# Patient Record
Sex: Female | Born: 1945 | Race: White | Hispanic: No | Marital: Married | Smoking: Never smoker
Health system: Southern US, Community
[De-identification: ages and names within clinical notes are randomized; demographics above are authoritative.]

## PROBLEM LIST (undated history)

## (undated) DIAGNOSIS — F419 Anxiety disorder, unspecified: Secondary | ICD-10-CM

## (undated) DIAGNOSIS — R32 Unspecified urinary incontinence: Secondary | ICD-10-CM

## (undated) DIAGNOSIS — E119 Type 2 diabetes mellitus without complications: Secondary | ICD-10-CM

## (undated) DIAGNOSIS — I10 Essential (primary) hypertension: Secondary | ICD-10-CM

## (undated) DIAGNOSIS — M7989 Other specified soft tissue disorders: Secondary | ICD-10-CM

## (undated) DIAGNOSIS — G4489 Other headache syndrome: Secondary | ICD-10-CM

## (undated) HISTORY — DX: Type 2 diabetes mellitus without complications: E11.9

## (undated) HISTORY — DX: Essential (primary) hypertension: I10

## (undated) HISTORY — DX: Other headache syndrome: G44.89

## (undated) HISTORY — DX: Anxiety disorder, unspecified: F41.9

## (undated) HISTORY — DX: Other specified soft tissue disorders: M79.89

## (undated) HISTORY — DX: Unspecified urinary incontinence: R32

## (undated) HISTORY — PX: NO PAST SURGERIES: SHX2092

---

## 1999-09-25 ENCOUNTER — Encounter: Payer: Self-pay | Admitting: Emergency Medicine

## 1999-09-25 ENCOUNTER — Emergency Department (HOSPITAL_COMMUNITY): Admission: EM | Admit: 1999-09-25 | Discharge: 1999-09-25 | Payer: Self-pay | Admitting: Emergency Medicine

## 1999-09-29 ENCOUNTER — Other Ambulatory Visit: Admission: RE | Admit: 1999-09-29 | Discharge: 1999-09-29 | Payer: Self-pay | Admitting: Obstetrics and Gynecology

## 2001-02-18 ENCOUNTER — Other Ambulatory Visit: Admission: RE | Admit: 2001-02-18 | Discharge: 2001-02-18 | Payer: Self-pay | Admitting: Obstetrics and Gynecology

## 2002-02-20 ENCOUNTER — Other Ambulatory Visit: Admission: RE | Admit: 2002-02-20 | Discharge: 2002-02-20 | Payer: Self-pay | Admitting: Obstetrics and Gynecology

## 2003-03-13 ENCOUNTER — Other Ambulatory Visit: Admission: RE | Admit: 2003-03-13 | Discharge: 2003-03-13 | Payer: Self-pay | Admitting: Obstetrics and Gynecology

## 2004-03-14 ENCOUNTER — Other Ambulatory Visit: Admission: RE | Admit: 2004-03-14 | Discharge: 2004-03-14 | Payer: Self-pay | Admitting: Obstetrics and Gynecology

## 2005-02-01 ENCOUNTER — Ambulatory Visit (HOSPITAL_COMMUNITY): Admission: RE | Admit: 2005-02-01 | Discharge: 2005-02-01 | Payer: Self-pay | Admitting: Gastroenterology

## 2005-02-01 ENCOUNTER — Encounter (INDEPENDENT_AMBULATORY_CARE_PROVIDER_SITE_OTHER): Payer: Self-pay | Admitting: *Deleted

## 2005-02-01 LAB — HM COLONOSCOPY

## 2005-07-12 ENCOUNTER — Other Ambulatory Visit: Admission: RE | Admit: 2005-07-12 | Discharge: 2005-07-12 | Payer: Self-pay | Admitting: *Deleted

## 2006-04-02 ENCOUNTER — Other Ambulatory Visit: Admission: RE | Admit: 2006-04-02 | Discharge: 2006-04-02 | Payer: Self-pay | Admitting: Obstetrics & Gynecology

## 2006-09-05 ENCOUNTER — Ambulatory Visit (HOSPITAL_COMMUNITY): Admission: RE | Admit: 2006-09-05 | Discharge: 2006-09-05 | Payer: Self-pay | Admitting: Family Medicine

## 2006-09-05 ENCOUNTER — Encounter: Payer: Self-pay | Admitting: Vascular Surgery

## 2007-07-25 ENCOUNTER — Other Ambulatory Visit: Admission: RE | Admit: 2007-07-25 | Discharge: 2007-07-25 | Payer: Self-pay | Admitting: Obstetrics & Gynecology

## 2008-08-19 ENCOUNTER — Other Ambulatory Visit: Admission: RE | Admit: 2008-08-19 | Discharge: 2008-08-19 | Payer: Self-pay | Admitting: Obstetrics and Gynecology

## 2011-03-31 NOTE — Op Note (Signed)
Amanda Collier, Amanda Collier             ACCOUNT NO.:  [id number]   MEDICAL RECORD NO.:  [id number]          PATIENT TYPE:  AMB   LOCATION:  ENDO                         FACILITY:  [facility]   PHYSICIAN:  Anselmo Rod, M.D.  DATE OF BIRTH:  1946/08/26   DATE OF PROCEDURE:  02/01/2005  DATE OF DISCHARGE:                                 OPERATIVE REPORT   PROCEDURE PERFORMED:  Colonoscopy with cold biopsies x 3.   ENDOSCOPIST:  Anselmo Rod, M.D.   INSTRUMENT USED:  Olympus video colonoscope.   INDICATIONS FOR PROCEDURE:  A 65 year old white female with an unknown  family history (the patient is adopted), with occasional BRBPR, undergoing a  screening colonoscopy to rule out colonic polyps, masses, etc.   PRE-PROCEDURE PREPARATION:  Informed consent was procured from the patient.  The patient was fasted for eight hours prior to the procedure and prepped  with a bottle of magnesium citrate and a gallon of GoLYTELY the night prior  to the procedure.  Risks and benefits of the procedure including a 10% miss  rate of cancer and polyps were discussed with the patient as well.   PRE-PROCEDURE PHYSICAL:  VITAL SIGNS:  The patient had stable vital signs.  NECK:  Supple.  CHEST:  Clear to auscultation.  CARDIOVASCULAR:  S1, S2 regular.  ABDOMEN:  Soft, with normal bowel sounds.   DESCRIPTION OF THE PROCEDURE:  The patient was placed in the left lateral  decubitus position, sedated with 70 mg of Demerol and 7 mg of Versed in slow  incremental doses.  Once the patient was adequately sedated and maintained  on low-flow oxygen and continuous cardiac monitoring, the Olympus video  colonoscope was advanced from the rectum to the cecum.  Gentle abdominal  pressure was applied to reach the cecal base.  The appendiceal orifice and  the ileocecal valve were clearly visualized and photographed.  There were  some early left-sided diverticula seen.  A small sessile polyp was biopsied  from the rectum  (cold biopsies x 3).  There was no evidence of internal  hemorrhoids on retroflexion.  A small external hemorrhoid was seen on anal  inspection.  The patient tolerated the procedure well, without  complications.   IMPRESSION:  1.  Small external hemorrhoid.  2.  Early sigmoid diverticula.  3.  Small sessile polyps biopsied from the rectum.  4.  Normal-appearing transverse colon, right colon, and cecum.   RECOMMENDATIONS:  1.  Await pathology results.  2.  Avoid nonsteroidals including aspirin for the next two weeks.  3.  Repeat colonoscopy depending on pathology results.  4.  Outpatient followup as need arises in the future.      JNM/MEDQ  D:  02/01/2005  T:  02/01/2005  Job:  [id number]   cc:   Duncan Dull, M.D.  [address]  [place]  [place] [address]  Fax: [phone]   Laqueta Linden, M.D.  [address]  [place]  [place] [address]  Fax: [phone]

## 2011-11-15 DIAGNOSIS — F411 Generalized anxiety disorder: Secondary | ICD-10-CM | POA: Diagnosis not present

## 2011-11-21 DIAGNOSIS — F411 Generalized anxiety disorder: Secondary | ICD-10-CM | POA: Diagnosis not present

## 2011-11-27 DIAGNOSIS — E119 Type 2 diabetes mellitus without complications: Secondary | ICD-10-CM | POA: Diagnosis not present

## 2011-11-27 DIAGNOSIS — I10 Essential (primary) hypertension: Secondary | ICD-10-CM | POA: Diagnosis not present

## 2011-11-27 DIAGNOSIS — E7800 Pure hypercholesterolemia, unspecified: Secondary | ICD-10-CM | POA: Diagnosis not present

## 2011-11-29 DIAGNOSIS — F411 Generalized anxiety disorder: Secondary | ICD-10-CM | POA: Diagnosis not present

## 2011-12-04 DIAGNOSIS — H60399 Other infective otitis externa, unspecified ear: Secondary | ICD-10-CM | POA: Diagnosis not present

## 2011-12-05 DIAGNOSIS — F411 Generalized anxiety disorder: Secondary | ICD-10-CM | POA: Diagnosis not present

## 2011-12-06 DIAGNOSIS — Z1231 Encounter for screening mammogram for malignant neoplasm of breast: Secondary | ICD-10-CM | POA: Diagnosis not present

## 2011-12-11 DIAGNOSIS — H60399 Other infective otitis externa, unspecified ear: Secondary | ICD-10-CM | POA: Diagnosis not present

## 2011-12-11 DIAGNOSIS — H93299 Other abnormal auditory perceptions, unspecified ear: Secondary | ICD-10-CM | POA: Diagnosis not present

## 2011-12-18 DIAGNOSIS — H60399 Other infective otitis externa, unspecified ear: Secondary | ICD-10-CM | POA: Diagnosis not present

## 2011-12-26 DIAGNOSIS — H60399 Other infective otitis externa, unspecified ear: Secondary | ICD-10-CM | POA: Diagnosis not present

## 2011-12-26 DIAGNOSIS — D239 Other benign neoplasm of skin, unspecified: Secondary | ICD-10-CM | POA: Diagnosis not present

## 2011-12-26 DIAGNOSIS — L819 Disorder of pigmentation, unspecified: Secondary | ICD-10-CM | POA: Diagnosis not present

## 2011-12-26 DIAGNOSIS — L570 Actinic keratosis: Secondary | ICD-10-CM | POA: Diagnosis not present

## 2012-01-24 DIAGNOSIS — Z01419 Encounter for gynecological examination (general) (routine) without abnormal findings: Secondary | ICD-10-CM | POA: Diagnosis not present

## 2012-01-24 DIAGNOSIS — Z124 Encounter for screening for malignant neoplasm of cervix: Secondary | ICD-10-CM | POA: Diagnosis not present

## 2012-04-23 DIAGNOSIS — F411 Generalized anxiety disorder: Secondary | ICD-10-CM | POA: Diagnosis not present

## 2012-04-24 DIAGNOSIS — E559 Vitamin D deficiency, unspecified: Secondary | ICD-10-CM | POA: Diagnosis not present

## 2012-04-24 DIAGNOSIS — E119 Type 2 diabetes mellitus without complications: Secondary | ICD-10-CM | POA: Diagnosis not present

## 2012-04-24 DIAGNOSIS — E7800 Pure hypercholesterolemia, unspecified: Secondary | ICD-10-CM | POA: Diagnosis not present

## 2012-04-24 DIAGNOSIS — I10 Essential (primary) hypertension: Secondary | ICD-10-CM | POA: Diagnosis not present

## 2012-04-26 DIAGNOSIS — H60399 Other infective otitis externa, unspecified ear: Secondary | ICD-10-CM | POA: Diagnosis not present

## 2012-05-03 DIAGNOSIS — H60399 Other infective otitis externa, unspecified ear: Secondary | ICD-10-CM | POA: Diagnosis not present

## 2012-05-03 DIAGNOSIS — H6690 Otitis media, unspecified, unspecified ear: Secondary | ICD-10-CM | POA: Diagnosis not present

## 2012-08-30 DIAGNOSIS — Z23 Encounter for immunization: Secondary | ICD-10-CM | POA: Diagnosis not present

## 2012-11-25 DIAGNOSIS — F411 Generalized anxiety disorder: Secondary | ICD-10-CM | POA: Diagnosis not present

## 2012-11-25 DIAGNOSIS — R609 Edema, unspecified: Secondary | ICD-10-CM | POA: Diagnosis not present

## 2012-11-25 DIAGNOSIS — E7800 Pure hypercholesterolemia, unspecified: Secondary | ICD-10-CM | POA: Diagnosis not present

## 2012-11-25 DIAGNOSIS — I10 Essential (primary) hypertension: Secondary | ICD-10-CM | POA: Diagnosis not present

## 2012-11-25 DIAGNOSIS — E119 Type 2 diabetes mellitus without complications: Secondary | ICD-10-CM | POA: Diagnosis not present

## 2012-11-25 DIAGNOSIS — Z1211 Encounter for screening for malignant neoplasm of colon: Secondary | ICD-10-CM | POA: Diagnosis not present

## 2012-11-25 DIAGNOSIS — E559 Vitamin D deficiency, unspecified: Secondary | ICD-10-CM | POA: Diagnosis not present

## 2012-11-26 DIAGNOSIS — H6120 Impacted cerumen, unspecified ear: Secondary | ICD-10-CM | POA: Diagnosis not present

## 2012-11-26 DIAGNOSIS — F411 Generalized anxiety disorder: Secondary | ICD-10-CM | POA: Diagnosis not present

## 2013-02-04 DIAGNOSIS — R05 Cough: Secondary | ICD-10-CM | POA: Diagnosis not present

## 2013-02-04 DIAGNOSIS — R059 Cough, unspecified: Secondary | ICD-10-CM | POA: Diagnosis not present

## 2013-02-04 DIAGNOSIS — J029 Acute pharyngitis, unspecified: Secondary | ICD-10-CM | POA: Diagnosis not present

## 2013-02-07 DIAGNOSIS — R0989 Other specified symptoms and signs involving the circulatory and respiratory systems: Secondary | ICD-10-CM | POA: Diagnosis not present

## 2013-02-07 DIAGNOSIS — R509 Fever, unspecified: Secondary | ICD-10-CM | POA: Diagnosis not present

## 2013-02-07 DIAGNOSIS — R059 Cough, unspecified: Secondary | ICD-10-CM | POA: Diagnosis not present

## 2013-02-07 DIAGNOSIS — H6690 Otitis media, unspecified, unspecified ear: Secondary | ICD-10-CM | POA: Diagnosis not present

## 2013-02-07 DIAGNOSIS — R05 Cough: Secondary | ICD-10-CM | POA: Diagnosis not present

## 2013-02-07 DIAGNOSIS — J189 Pneumonia, unspecified organism: Secondary | ICD-10-CM | POA: Diagnosis not present

## 2013-02-18 ENCOUNTER — Ambulatory Visit (INDEPENDENT_AMBULATORY_CARE_PROVIDER_SITE_OTHER): Payer: Medicare Other | Admitting: Obstetrics & Gynecology

## 2013-02-18 ENCOUNTER — Encounter: Payer: Self-pay | Admitting: Obstetrics & Gynecology

## 2013-02-18 VITALS — BP 140/88 | Ht 64.5 in | Wt 189.0 lb

## 2013-02-18 DIAGNOSIS — Z124 Encounter for screening for malignant neoplasm of cervix: Secondary | ICD-10-CM

## 2013-02-18 DIAGNOSIS — Z01419 Encounter for gynecological examination (general) (routine) without abnormal findings: Secondary | ICD-10-CM

## 2013-02-18 NOTE — Progress Notes (Signed)
67 y.o. G2P2 MarriedCaucasianF here for annual exam.  Diagnosed with pneumonia in the winter.  Will do colonoscopy later this year--[facility].  No vaginal bleeding.  Knows MMG is due.  Will do BMD this year as well.  Patient will schedule.  Patient's last menstrual period was 11/14/2003.          Sexually active: yes  The current method of family planning is post menopausal status.    Exercising: yes  walking Smoker:  no  Health Maintenance: Pap:  12/27/10 WNL MMG:  12/06/11 normal Colonoscopy:  3/06 BMD:   08/07/07 normal TDaP:  Up to date with person Labs: 1/14 with Shaune Pollack   reports that she has never smoked. She does not have any smokeless tobacco history on file. She reports that she drinks about 3.5 ounces of alcohol per week. She reports that she does not use illicit drugs.  Past Medical History  Diagnosis Date  . Hypertension     No past surgical history on file.  Current Outpatient Prescriptions  Medication Sig Dispense Refill  . amLODipine (NORVASC) 5 MG tablet Take 5 mg by mouth daily.      Marland Kitchen aspirin 81 MG tablet Take 81 mg by mouth daily.      Marland Kitchen atorvastatin (LIPITOR) 10 MG tablet Take 10 mg by mouth daily.      . clonazePAM (KLONOPIN) 0.5 MG tablet Take 0.5 mg by mouth as needed for anxiety.      . furosemide (LASIX) 40 MG tablet Take 40 mg by mouth daily.      Marland Kitchen ibuprofen (ADVIL,MOTRIN) 200 MG tablet Take 200 mg by mouth every 6 (six) hours as needed for pain.      . Losartan Potassium (COZAAR PO) Take by mouth daily.      . Vitamin D, Ergocalciferol, (DRISDOL) 50000 UNITS CAPS Take 50,000 Units by mouth every 7 (seven) days.      Marland Kitchen zolpidem (AMBIEN) 10 MG tablet Take 10 mg by mouth at bedtime as needed for sleep.       No current facility-administered medications for this visit.    Family History  Problem Relation Age of Onset  . Adopted: Yes  . Heart attack Maternal Uncle     ? unknown if correct/patient adopted    ROS:  Pertinent items are noted in HPI.   Otherwise, a comprehensive ROS was negative.  Exam:   BP 140/88  Ht 5' 4.5" (1.638 m)  Wt 189 lb (85.73 kg)  BMI 31.95 kg/m2  LMP 11/14/2003  Height:   Height: 5' 4.5" (163.8 cm)  Ht Readings from Last 3 Encounters:  02/18/13 5' 4.5" (1.638 m)    General appearance: alert, cooperative and appears stated age Head: Normocephalic, without obvious abnormality, atraumatic Breasts: normal appearance, no masses or tenderness Abdomen: soft, non-tender; bowel sounds normal; no masses,  no organomegaly Extremities: extremities normal, atraumatic, no cyanosis or edema Skin: Skin color, texture, turgor normal. No rashes or lesions Lymph nodes: Cervical, supraclavicular, and axillary nodes normal. No abnormal inguinal nodes palpated Neurologic: Grossly normal   Pelvic: External genitalia:  no lesions              Urethra:  normal appearing urethra with no masses, tenderness or lesions              Bartholins and Skenes: normal                 Vagina: normal appearing vagina with normal color and discharge,  no lesions              Cervix: no lesions              Pap taken: yes Bimanual Exam:  Uterus:  normal size, contour, position, consistency, mobility, non-tender              Adnexa: normal adnexa and no mass, fullness, tenderness               Rectovaginal: Confirms               Anus:  normal sphincter tone, no lesions  A:  Well Woman with normal exam, PMP no HRT SUI with recent pneumonia Borderline diabetes Elevated lipids  P:   mammogram pap smear Labs with Shaune Pollack Offered referral to pelvic PT.  Pt will consider and let me know. return annually or prn  An After Visit Summary was printed and given to the patient.

## 2013-02-18 NOTE — Patient Instructions (Signed)

## 2013-02-21 LAB — IPS PAP SMEAR ONLY

## 2013-03-19 ENCOUNTER — Other Ambulatory Visit: Payer: Self-pay | Admitting: Gastroenterology

## 2013-03-19 DIAGNOSIS — D126 Benign neoplasm of colon, unspecified: Secondary | ICD-10-CM | POA: Diagnosis not present

## 2013-03-19 DIAGNOSIS — K5730 Diverticulosis of large intestine without perforation or abscess without bleeding: Secondary | ICD-10-CM | POA: Diagnosis not present

## 2013-03-19 DIAGNOSIS — K648 Other hemorrhoids: Secondary | ICD-10-CM | POA: Diagnosis not present

## 2013-03-19 DIAGNOSIS — Z09 Encounter for follow-up examination after completed treatment for conditions other than malignant neoplasm: Secondary | ICD-10-CM | POA: Diagnosis not present

## 2013-03-19 DIAGNOSIS — Z86010 Personal history of colonic polyps: Secondary | ICD-10-CM | POA: Diagnosis not present

## 2013-03-26 ENCOUNTER — Telehealth: Payer: Self-pay | Admitting: Obstetrics & Gynecology

## 2013-03-26 NOTE — Telephone Encounter (Signed)
Left message to return call to our office for ? Results.

## 2013-03-26 NOTE — Telephone Encounter (Signed)
Pt calling for results. Not sure if she received them.

## 2013-03-28 NOTE — Telephone Encounter (Signed)
Patient calling for results ? Please advise? Do not see any results noted. sue

## 2013-03-28 NOTE — Telephone Encounter (Signed)
I only did a Pap on her.  It was normal.  All her labs are done with Dr. Kevan Ny.  Is there something else she is thinking about?

## 2013-04-01 NOTE — Telephone Encounter (Signed)
done

## 2013-04-01 NOTE — Telephone Encounter (Signed)
i spoke with patient yesterday and gave her pap results. BMD referral is in your box on your desk. Will you sign it and I will fax it to [facility].

## 2013-04-01 NOTE — Telephone Encounter (Signed)
Patient needs bone density referral (discussed previously with Dr.Miller); [facility] (fax # [phone]). Her appointment is a week from Thursday.

## 2013-04-09 ENCOUNTER — Telehealth: Payer: Self-pay | Admitting: *Deleted

## 2013-04-09 NOTE — Telephone Encounter (Signed)
FORM FOR [facility] IN YOUR OFFICE FOR BONE DENSITY. MEDICARE. SUE

## 2013-04-10 DIAGNOSIS — Z780 Asymptomatic menopausal state: Secondary | ICD-10-CM | POA: Diagnosis not present

## 2013-04-10 DIAGNOSIS — Z1231 Encounter for screening mammogram for malignant neoplasm of breast: Secondary | ICD-10-CM | POA: Diagnosis not present

## 2013-04-10 NOTE — Telephone Encounter (Signed)
FORM FAXED TO [facility] [place]. MAMMOGRAM.

## 2013-04-10 NOTE — Telephone Encounter (Signed)
Amanda Collier will bring to you.

## 2013-04-22 ENCOUNTER — Telehealth: Payer: Self-pay

## 2013-04-22 NOTE — Telephone Encounter (Signed)
lmtcb re: [facility] BMD results//kn

## 2013-04-25 DIAGNOSIS — H60399 Other infective otitis externa, unspecified ear: Secondary | ICD-10-CM | POA: Diagnosis not present

## 2013-04-29 DIAGNOSIS — E119 Type 2 diabetes mellitus without complications: Secondary | ICD-10-CM | POA: Diagnosis not present

## 2013-05-01 ENCOUNTER — Other Ambulatory Visit: Payer: Self-pay

## 2013-05-01 DIAGNOSIS — D235 Other benign neoplasm of skin of trunk: Secondary | ICD-10-CM | POA: Diagnosis not present

## 2013-05-01 DIAGNOSIS — L909 Atrophic disorder of skin, unspecified: Secondary | ICD-10-CM | POA: Diagnosis not present

## 2013-05-01 DIAGNOSIS — C44319 Basal cell carcinoma of skin of other parts of face: Secondary | ICD-10-CM | POA: Diagnosis not present

## 2013-05-01 DIAGNOSIS — L821 Other seborrheic keratosis: Secondary | ICD-10-CM | POA: Diagnosis not present

## 2013-05-01 DIAGNOSIS — C44519 Basal cell carcinoma of skin of other part of trunk: Secondary | ICD-10-CM | POA: Diagnosis not present

## 2013-05-01 DIAGNOSIS — L919 Hypertrophic disorder of the skin, unspecified: Secondary | ICD-10-CM | POA: Diagnosis not present

## 2013-05-06 NOTE — Telephone Encounter (Signed)
6/24 lmtcb//kn 

## 2013-05-12 ENCOUNTER — Telehealth: Payer: Self-pay | Admitting: Obstetrics & Gynecology

## 2013-05-12 NOTE — Telephone Encounter (Signed)
Patient called and is calling back to find out what we needed . She is in [place] For the summer.   Please call her back . Note: Time diffence. You may call her on her home phone in [place]. [phone]  and you may leave a message.

## 2013-05-12 NOTE — Telephone Encounter (Signed)
OK to scan/file

## 2013-05-12 NOTE — Telephone Encounter (Signed)
Left detailed message with results of BMD per patient. I tried # to home in [place], but it was not correct. Is ok to scan and file the results or do I need to try to contact her later in the fall?

## 2013-05-12 NOTE — Telephone Encounter (Signed)
6/30 results of BMD given to patient//kn

## 2013-06-30 DIAGNOSIS — C44319 Basal cell carcinoma of skin of other parts of face: Secondary | ICD-10-CM | POA: Diagnosis not present

## 2013-08-21 DIAGNOSIS — F489 Nonpsychotic mental disorder, unspecified: Secondary | ICD-10-CM | POA: Diagnosis not present

## 2013-08-21 DIAGNOSIS — F604 Histrionic personality disorder: Secondary | ICD-10-CM | POA: Diagnosis not present

## 2013-08-28 DIAGNOSIS — E669 Obesity, unspecified: Secondary | ICD-10-CM | POA: Diagnosis not present

## 2013-08-28 DIAGNOSIS — E559 Vitamin D deficiency, unspecified: Secondary | ICD-10-CM | POA: Diagnosis not present

## 2013-08-28 DIAGNOSIS — E7800 Pure hypercholesterolemia, unspecified: Secondary | ICD-10-CM | POA: Diagnosis not present

## 2013-08-28 DIAGNOSIS — E119 Type 2 diabetes mellitus without complications: Secondary | ICD-10-CM | POA: Diagnosis not present

## 2013-08-28 DIAGNOSIS — I10 Essential (primary) hypertension: Secondary | ICD-10-CM | POA: Diagnosis not present

## 2013-08-28 DIAGNOSIS — F411 Generalized anxiety disorder: Secondary | ICD-10-CM | POA: Diagnosis not present

## 2013-09-17 DIAGNOSIS — E119 Type 2 diabetes mellitus without complications: Secondary | ICD-10-CM | POA: Diagnosis not present

## 2013-09-17 DIAGNOSIS — F411 Generalized anxiety disorder: Secondary | ICD-10-CM | POA: Diagnosis not present

## 2013-09-17 DIAGNOSIS — E559 Vitamin D deficiency, unspecified: Secondary | ICD-10-CM | POA: Diagnosis not present

## 2013-09-17 DIAGNOSIS — E7800 Pure hypercholesterolemia, unspecified: Secondary | ICD-10-CM | POA: Diagnosis not present

## 2013-09-17 DIAGNOSIS — E669 Obesity, unspecified: Secondary | ICD-10-CM | POA: Diagnosis not present

## 2013-09-17 DIAGNOSIS — I10 Essential (primary) hypertension: Secondary | ICD-10-CM | POA: Diagnosis not present

## 2013-12-11 DIAGNOSIS — F411 Generalized anxiety disorder: Secondary | ICD-10-CM | POA: Diagnosis not present

## 2014-02-02 DIAGNOSIS — H93299 Other abnormal auditory perceptions, unspecified ear: Secondary | ICD-10-CM | POA: Diagnosis not present

## 2014-02-02 DIAGNOSIS — H6240 Otitis externa in other diseases classified elsewhere, unspecified ear: Secondary | ICD-10-CM | POA: Diagnosis not present

## 2014-02-02 DIAGNOSIS — B369 Superficial mycosis, unspecified: Secondary | ICD-10-CM | POA: Diagnosis not present

## 2014-04-13 DIAGNOSIS — Z6832 Body mass index (BMI) 32.0-32.9, adult: Secondary | ICD-10-CM | POA: Diagnosis not present

## 2014-04-13 DIAGNOSIS — I10 Essential (primary) hypertension: Secondary | ICD-10-CM | POA: Diagnosis not present

## 2014-04-13 DIAGNOSIS — F411 Generalized anxiety disorder: Secondary | ICD-10-CM | POA: Diagnosis not present

## 2014-04-13 DIAGNOSIS — E119 Type 2 diabetes mellitus without complications: Secondary | ICD-10-CM | POA: Diagnosis not present

## 2014-04-13 DIAGNOSIS — E669 Obesity, unspecified: Secondary | ICD-10-CM | POA: Diagnosis not present

## 2014-04-13 DIAGNOSIS — E7800 Pure hypercholesterolemia, unspecified: Secondary | ICD-10-CM | POA: Diagnosis not present

## 2014-04-13 DIAGNOSIS — E559 Vitamin D deficiency, unspecified: Secondary | ICD-10-CM | POA: Diagnosis not present

## 2014-04-13 DIAGNOSIS — Z23 Encounter for immunization: Secondary | ICD-10-CM | POA: Diagnosis not present

## 2014-04-15 DIAGNOSIS — Z1231 Encounter for screening mammogram for malignant neoplasm of breast: Secondary | ICD-10-CM | POA: Diagnosis not present

## 2014-04-16 DIAGNOSIS — E119 Type 2 diabetes mellitus without complications: Secondary | ICD-10-CM | POA: Diagnosis not present

## 2014-04-16 DIAGNOSIS — H5200 Hypermetropia, unspecified eye: Secondary | ICD-10-CM | POA: Diagnosis not present

## 2014-04-16 DIAGNOSIS — H524 Presbyopia: Secondary | ICD-10-CM | POA: Diagnosis not present

## 2014-04-16 DIAGNOSIS — H52229 Regular astigmatism, unspecified eye: Secondary | ICD-10-CM | POA: Diagnosis not present

## 2014-04-17 DIAGNOSIS — N6459 Other signs and symptoms in breast: Secondary | ICD-10-CM | POA: Diagnosis not present

## 2014-05-06 DIAGNOSIS — D485 Neoplasm of uncertain behavior of skin: Secondary | ICD-10-CM | POA: Diagnosis not present

## 2014-05-06 DIAGNOSIS — D235 Other benign neoplasm of skin of trunk: Secondary | ICD-10-CM | POA: Diagnosis not present

## 2014-05-06 DIAGNOSIS — L570 Actinic keratosis: Secondary | ICD-10-CM | POA: Diagnosis not present

## 2014-05-06 DIAGNOSIS — L819 Disorder of pigmentation, unspecified: Secondary | ICD-10-CM | POA: Diagnosis not present

## 2014-05-06 DIAGNOSIS — L821 Other seborrheic keratosis: Secondary | ICD-10-CM | POA: Diagnosis not present

## 2014-07-30 ENCOUNTER — Ambulatory Visit: Payer: PRIVATE HEALTH INSURANCE | Admitting: Obstetrics & Gynecology

## 2014-08-18 DIAGNOSIS — Z23 Encounter for immunization: Secondary | ICD-10-CM | POA: Diagnosis not present

## 2014-09-03 DIAGNOSIS — I10 Essential (primary) hypertension: Secondary | ICD-10-CM | POA: Diagnosis not present

## 2014-09-03 DIAGNOSIS — R079 Chest pain, unspecified: Secondary | ICD-10-CM | POA: Diagnosis not present

## 2014-09-14 ENCOUNTER — Encounter: Payer: Self-pay | Admitting: Obstetrics & Gynecology

## 2014-10-21 DIAGNOSIS — G4700 Insomnia, unspecified: Secondary | ICD-10-CM | POA: Diagnosis not present

## 2014-10-21 DIAGNOSIS — E559 Vitamin D deficiency, unspecified: Secondary | ICD-10-CM | POA: Diagnosis not present

## 2014-10-21 DIAGNOSIS — E669 Obesity, unspecified: Secondary | ICD-10-CM | POA: Diagnosis not present

## 2014-10-21 DIAGNOSIS — E780 Pure hypercholesterolemia: Secondary | ICD-10-CM | POA: Diagnosis not present

## 2014-10-21 DIAGNOSIS — E119 Type 2 diabetes mellitus without complications: Secondary | ICD-10-CM | POA: Diagnosis not present

## 2014-10-21 DIAGNOSIS — I10 Essential (primary) hypertension: Secondary | ICD-10-CM | POA: Diagnosis not present

## 2014-11-04 ENCOUNTER — Encounter: Payer: Self-pay | Admitting: Obstetrics & Gynecology

## 2014-11-04 ENCOUNTER — Ambulatory Visit (INDEPENDENT_AMBULATORY_CARE_PROVIDER_SITE_OTHER): Payer: Medicare Other | Admitting: Obstetrics & Gynecology

## 2014-11-04 VITALS — BP 156/84 | HR 80 | Resp 16 | Ht 63.75 in | Wt 188.6 lb

## 2014-11-04 DIAGNOSIS — Z124 Encounter for screening for malignant neoplasm of cervix: Secondary | ICD-10-CM | POA: Diagnosis not present

## 2014-11-04 DIAGNOSIS — Z01419 Encounter for gynecological examination (general) (routine) without abnormal findings: Secondary | ICD-10-CM | POA: Diagnosis not present

## 2014-11-04 DIAGNOSIS — I10 Essential (primary) hypertension: Secondary | ICD-10-CM | POA: Diagnosis not present

## 2014-11-04 DIAGNOSIS — F419 Anxiety disorder, unspecified: Secondary | ICD-10-CM | POA: Diagnosis not present

## 2014-11-04 NOTE — Progress Notes (Addendum)
68 y.o. G2P2 MarriedCaucasianF here for annual exam.  Doing well.  No vaginal bleeding.  Had colonoscopy 2014 at [facility].  One polyp and f/u 5 years was recommended.  Denies vaginal bleeding.  Son and daughter in law "situation" is causes a lot of stress for pt.    Saw Dr. Inda Merlin about this and BP meds.  BPs are elevated.  Has an appt this afternoon.  Leaving in four days for [place] cruise.  Has three weeks trip planned.    Patient's last menstrual period was 11/14/2003.          Sexually active: Yes.    The current method of family planning is vasectomy and post menopausal status.    Exercising: Yes.    walking Smoker:  no  Health Maintenance: Pap:  02/18/13 WNL History of abnormal Pap:  no MMG:  04/15/14 MMG, 04/17/14-US-screening in one year Colonoscopy:  2014 with [facility] BMD:   04/10/13-normal TDaP:  Up to date with PCP Screening Labs: PCP, Hb today: PCP, Urine today: PCP   reports that she has never smoked. She has never used smokeless tobacco. She reports that she drinks about 4.2 oz of alcohol per week. She reports that she does not use illicit drugs.  Past Medical History  Diagnosis Date  . Hypertension     followed by Dr Gillian Shields medications    History reviewed. No pertinent past surgical history.  Current Outpatient Prescriptions  Medication Sig Dispense Refill  . amLODipine (NORVASC) 5 MG tablet Take 5 mg by mouth daily.    Marland Kitchen aspirin 81 MG tablet Take 81 mg by mouth daily.    Marland Kitchen atorvastatin (LIPITOR) 10 MG tablet Take 10 mg by mouth daily.    . clonazePAM (KLONOPIN) 0.5 MG tablet Take 0.5 mg by mouth as needed for anxiety.    . furosemide (LASIX) 40 MG tablet Take 40 mg by mouth daily.    . hydrochlorothiazide (HYDRODIURIL) 25 MG tablet Take 25 mg by mouth daily.    Marland Kitchen ibuprofen (ADVIL,MOTRIN) 200 MG tablet Take 200 mg by mouth every 6 (six) hours as needed for pain.    . Vitamin D, Ergocalciferol, (DRISDOL) 50000 UNITS CAPS Take 50,000  Units by mouth every 7 (seven) days.    Marland Kitchen zolpidem (AMBIEN) 10 MG tablet Take 10 mg by mouth at bedtime as needed for sleep.     No current facility-administered medications for this visit.    Family History  Problem Relation Age of Onset  . Adopted: Yes  . Heart attack Maternal Uncle     ? unknown if correct/patient adopted    ROS:  Pertinent items are noted in HPI.  Otherwise, a comprehensive ROS was negative.  Exam:   BP 156/84 mmHg  Pulse 80  Resp 16  Ht 5' 3.75" (1.619 m)  Wt 188 lb 9.6 oz (85.548 kg)  BMI 32.64 kg/m2  LMP 11/14/2003   Height: 5' 3.75" (161.9 cm)  Ht Readings from Last 3 Encounters:  11/04/14 5' 3.75" (1.619 m)  02/18/13 5' 4.5" (1.638 m)    General appearance: alert, cooperative and appears stated age Head: Normocephalic, without obvious abnormality, atraumatic Neck: no adenopathy, supple, symmetrical, trachea midline and thyroid normal to inspection and palpation Lungs: clear to auscultation bilaterally Breasts: normal appearance, no masses or tenderness Heart: regular rate and rhythm Abdomen: soft, non-tender; bowel sounds normal; no masses,  no organomegaly Extremities: extremities normal, atraumatic, no cyanosis or edema Skin: Skin color, texture,  turgor normal. No rashes or lesions Lymph nodes: Cervical, supraclavicular, and axillary nodes normal. No abnormal inguinal nodes palpated Neurologic: Grossly normal   Pelvic: External genitalia:  no lesions              Urethra:  normal appearing urethra with no masses, tenderness or lesions              Bartholins and Skenes: normal                 Vagina: normal appearing vagina with normal color and discharge, no lesions              Cervix: no lesions              Pap taken: No. Bimanual Exam:  Uterus:  normal size, contour, position, consistency, mobility, non-tender              Adnexa: normal adnexa and no mass, fullness, tenderness               Rectovaginal: Confirms               Anus:   normal sphincter tone, no lesions  Chaperone was present for exam.  A:  Well Woman with normal exam, PMP no HRT Borderline diabetes Elevated lipids Hypertension New onset of diabetes.  Was borderline.  Hb A1C 6.4.    P: Mammogram yearly pap smear 2014.  No pap today Labs with Darcus Austin.  Has appt this afternoon.   return annually or prn   Obtained colonoscopy report from [facility].  Colonoscopy 03/19/13.  Tubular adenoma.  F/U 5 years.

## 2014-11-19 NOTE — Addendum Note (Signed)
Addended by: Megan Salon on: 11/19/2014 06:21 PM   Modules accepted: Miquel Dunn

## 2014-12-07 DIAGNOSIS — E119 Type 2 diabetes mellitus without complications: Secondary | ICD-10-CM | POA: Diagnosis not present

## 2014-12-07 DIAGNOSIS — I10 Essential (primary) hypertension: Secondary | ICD-10-CM | POA: Diagnosis not present

## 2014-12-07 DIAGNOSIS — J40 Bronchitis, not specified as acute or chronic: Secondary | ICD-10-CM | POA: Diagnosis not present

## 2015-04-29 DIAGNOSIS — E119 Type 2 diabetes mellitus without complications: Secondary | ICD-10-CM | POA: Diagnosis not present

## 2015-04-29 DIAGNOSIS — I10 Essential (primary) hypertension: Secondary | ICD-10-CM | POA: Diagnosis not present

## 2015-04-29 DIAGNOSIS — E780 Pure hypercholesterolemia: Secondary | ICD-10-CM | POA: Diagnosis not present

## 2015-05-12 DIAGNOSIS — I10 Essential (primary) hypertension: Secondary | ICD-10-CM | POA: Diagnosis not present

## 2015-05-12 DIAGNOSIS — R252 Cramp and spasm: Secondary | ICD-10-CM | POA: Diagnosis not present

## 2015-05-12 DIAGNOSIS — R739 Hyperglycemia, unspecified: Secondary | ICD-10-CM | POA: Diagnosis not present

## 2015-05-12 DIAGNOSIS — E785 Hyperlipidemia, unspecified: Secondary | ICD-10-CM | POA: Diagnosis not present

## 2015-05-12 DIAGNOSIS — E559 Vitamin D deficiency, unspecified: Secondary | ICD-10-CM | POA: Diagnosis not present

## 2015-05-18 DIAGNOSIS — M7989 Other specified soft tissue disorders: Secondary | ICD-10-CM | POA: Diagnosis not present

## 2015-05-24 DIAGNOSIS — M9902 Segmental and somatic dysfunction of thoracic region: Secondary | ICD-10-CM | POA: Diagnosis not present

## 2015-05-24 DIAGNOSIS — M9904 Segmental and somatic dysfunction of sacral region: Secondary | ICD-10-CM | POA: Diagnosis not present

## 2015-05-24 DIAGNOSIS — S39013A Strain of muscle, fascia and tendon of pelvis, initial encounter: Secondary | ICD-10-CM | POA: Diagnosis not present

## 2015-05-24 DIAGNOSIS — M9903 Segmental and somatic dysfunction of lumbar region: Secondary | ICD-10-CM | POA: Diagnosis not present

## 2015-05-24 DIAGNOSIS — S233XXA Sprain of ligaments of thoracic spine, initial encounter: Secondary | ICD-10-CM | POA: Diagnosis not present

## 2015-05-24 DIAGNOSIS — S39012A Strain of muscle, fascia and tendon of lower back, initial encounter: Secondary | ICD-10-CM | POA: Diagnosis not present

## 2015-05-28 DIAGNOSIS — S233XXA Sprain of ligaments of thoracic spine, initial encounter: Secondary | ICD-10-CM | POA: Diagnosis not present

## 2015-05-28 DIAGNOSIS — M9903 Segmental and somatic dysfunction of lumbar region: Secondary | ICD-10-CM | POA: Diagnosis not present

## 2015-05-28 DIAGNOSIS — M9904 Segmental and somatic dysfunction of sacral region: Secondary | ICD-10-CM | POA: Diagnosis not present

## 2015-05-28 DIAGNOSIS — M9902 Segmental and somatic dysfunction of thoracic region: Secondary | ICD-10-CM | POA: Diagnosis not present

## 2015-05-28 DIAGNOSIS — S39012A Strain of muscle, fascia and tendon of lower back, initial encounter: Secondary | ICD-10-CM | POA: Diagnosis not present

## 2015-05-28 DIAGNOSIS — S39013A Strain of muscle, fascia and tendon of pelvis, initial encounter: Secondary | ICD-10-CM | POA: Diagnosis not present

## 2015-05-31 DIAGNOSIS — S39012A Strain of muscle, fascia and tendon of lower back, initial encounter: Secondary | ICD-10-CM | POA: Diagnosis not present

## 2015-05-31 DIAGNOSIS — S39013A Strain of muscle, fascia and tendon of pelvis, initial encounter: Secondary | ICD-10-CM | POA: Diagnosis not present

## 2015-05-31 DIAGNOSIS — M9904 Segmental and somatic dysfunction of sacral region: Secondary | ICD-10-CM | POA: Diagnosis not present

## 2015-05-31 DIAGNOSIS — M9903 Segmental and somatic dysfunction of lumbar region: Secondary | ICD-10-CM | POA: Diagnosis not present

## 2015-05-31 DIAGNOSIS — M9902 Segmental and somatic dysfunction of thoracic region: Secondary | ICD-10-CM | POA: Diagnosis not present

## 2015-05-31 DIAGNOSIS — S233XXA Sprain of ligaments of thoracic spine, initial encounter: Secondary | ICD-10-CM | POA: Diagnosis not present

## 2015-06-02 DIAGNOSIS — R252 Cramp and spasm: Secondary | ICD-10-CM | POA: Diagnosis not present

## 2015-06-09 DIAGNOSIS — M9903 Segmental and somatic dysfunction of lumbar region: Secondary | ICD-10-CM | POA: Diagnosis not present

## 2015-06-09 DIAGNOSIS — M9902 Segmental and somatic dysfunction of thoracic region: Secondary | ICD-10-CM | POA: Diagnosis not present

## 2015-06-09 DIAGNOSIS — S39012A Strain of muscle, fascia and tendon of lower back, initial encounter: Secondary | ICD-10-CM | POA: Diagnosis not present

## 2015-06-09 DIAGNOSIS — S233XXA Sprain of ligaments of thoracic spine, initial encounter: Secondary | ICD-10-CM | POA: Diagnosis not present

## 2015-06-09 DIAGNOSIS — S39013A Strain of muscle, fascia and tendon of pelvis, initial encounter: Secondary | ICD-10-CM | POA: Diagnosis not present

## 2015-06-09 DIAGNOSIS — M9904 Segmental and somatic dysfunction of sacral region: Secondary | ICD-10-CM | POA: Diagnosis not present

## 2015-06-23 DIAGNOSIS — S233XXA Sprain of ligaments of thoracic spine, initial encounter: Secondary | ICD-10-CM | POA: Diagnosis not present

## 2015-06-23 DIAGNOSIS — S39012A Strain of muscle, fascia and tendon of lower back, initial encounter: Secondary | ICD-10-CM | POA: Diagnosis not present

## 2015-06-23 DIAGNOSIS — M9903 Segmental and somatic dysfunction of lumbar region: Secondary | ICD-10-CM | POA: Diagnosis not present

## 2015-06-23 DIAGNOSIS — M9904 Segmental and somatic dysfunction of sacral region: Secondary | ICD-10-CM | POA: Diagnosis not present

## 2015-06-23 DIAGNOSIS — M9902 Segmental and somatic dysfunction of thoracic region: Secondary | ICD-10-CM | POA: Diagnosis not present

## 2015-06-23 DIAGNOSIS — S39013A Strain of muscle, fascia and tendon of pelvis, initial encounter: Secondary | ICD-10-CM | POA: Diagnosis not present

## 2015-07-07 DIAGNOSIS — J020 Streptococcal pharyngitis: Secondary | ICD-10-CM | POA: Diagnosis not present

## 2015-07-14 DIAGNOSIS — S233XXA Sprain of ligaments of thoracic spine, initial encounter: Secondary | ICD-10-CM | POA: Diagnosis not present

## 2015-07-14 DIAGNOSIS — S39013A Strain of muscle, fascia and tendon of pelvis, initial encounter: Secondary | ICD-10-CM | POA: Diagnosis not present

## 2015-07-14 DIAGNOSIS — M9904 Segmental and somatic dysfunction of sacral region: Secondary | ICD-10-CM | POA: Diagnosis not present

## 2015-07-14 DIAGNOSIS — S39012A Strain of muscle, fascia and tendon of lower back, initial encounter: Secondary | ICD-10-CM | POA: Diagnosis not present

## 2015-07-14 DIAGNOSIS — M9903 Segmental and somatic dysfunction of lumbar region: Secondary | ICD-10-CM | POA: Diagnosis not present

## 2015-07-14 DIAGNOSIS — M9902 Segmental and somatic dysfunction of thoracic region: Secondary | ICD-10-CM | POA: Diagnosis not present

## 2015-08-04 DIAGNOSIS — L820 Inflamed seborrheic keratosis: Secondary | ICD-10-CM | POA: Diagnosis not present

## 2015-08-04 DIAGNOSIS — L570 Actinic keratosis: Secondary | ICD-10-CM | POA: Diagnosis not present

## 2015-08-04 DIAGNOSIS — Z789 Other specified health status: Secondary | ICD-10-CM | POA: Diagnosis not present

## 2015-08-04 DIAGNOSIS — Z85828 Personal history of other malignant neoplasm of skin: Secondary | ICD-10-CM | POA: Diagnosis not present

## 2015-09-24 DIAGNOSIS — Z23 Encounter for immunization: Secondary | ICD-10-CM | POA: Diagnosis not present

## 2015-10-02 DIAGNOSIS — J029 Acute pharyngitis, unspecified: Secondary | ICD-10-CM | POA: Diagnosis not present

## 2015-10-27 DIAGNOSIS — Z1231 Encounter for screening mammogram for malignant neoplasm of breast: Secondary | ICD-10-CM | POA: Diagnosis not present

## 2015-11-24 DIAGNOSIS — F4323 Adjustment disorder with mixed anxiety and depressed mood: Secondary | ICD-10-CM | POA: Diagnosis not present

## 2015-12-17 ENCOUNTER — Encounter: Payer: Self-pay | Admitting: Obstetrics & Gynecology

## 2015-12-17 ENCOUNTER — Ambulatory Visit (INDEPENDENT_AMBULATORY_CARE_PROVIDER_SITE_OTHER): Payer: Medicare Other | Admitting: Obstetrics & Gynecology

## 2015-12-17 VITALS — BP 132/62 | HR 70 | Resp 16 | Ht 64.0 in | Wt 186.0 lb

## 2015-12-17 DIAGNOSIS — E785 Hyperlipidemia, unspecified: Secondary | ICD-10-CM

## 2015-12-17 DIAGNOSIS — R7303 Prediabetes: Secondary | ICD-10-CM | POA: Diagnosis not present

## 2015-12-17 DIAGNOSIS — Z124 Encounter for screening for malignant neoplasm of cervix: Secondary | ICD-10-CM | POA: Diagnosis not present

## 2015-12-17 DIAGNOSIS — I10 Essential (primary) hypertension: Secondary | ICD-10-CM | POA: Insufficient documentation

## 2015-12-17 DIAGNOSIS — Z01419 Encounter for gynecological examination (general) (routine) without abnormal findings: Secondary | ICD-10-CM

## 2015-12-17 NOTE — Patient Instructions (Addendum)
Ask Dr. Inda Merlin about having a Hepatitis C test completed.  CDC recommendations.

## 2015-12-17 NOTE — Progress Notes (Signed)
70 y.o. G2P2 MarriedCaucasianF here for annual exam.  Doing well.  Pt's family is doing well as well.    No vaginal bleeding.    PCP:  Dr. Inda Merlin.  Appt in June.  Has appt this week.  Pt is being monitored a little more frequently for mildly elevated BS.  Last HbA1C was in the upper six range.  Not on medication.    Patient's last menstrual period was 11/14/2003.          Sexually active: Yes.    The current method of family planning is post menopausal status.    Exercising: Yes.    Yoga, walking Smoker:  no  Health Maintenance: Pap:  02/18/13 Neg History of abnormal Pap:  no MMG: 10/27/15 BIRADS1:neg Colonoscopy: 03/19/13 Polyps. Repeat 5 years  BMD:  04/10/13 Normal  TDaP:  w/ PCP Screening Labs: PCP, Hb today: PCP, Urine today: PCP   reports that she has never smoked. She has never used smokeless tobacco. She reports that she drinks about 4.2 oz of alcohol per week. She reports that she does not use illicit drugs.  Past Medical History  Diagnosis Date  . Hypertension     followed by Dr Gillian Shields medications  . Leg swelling     Hx of    History reviewed. No pertinent past surgical history.  Current Outpatient Prescriptions  Medication Sig Dispense Refill  . amLODipine (NORVASC) 5 MG tablet Take 5 mg by mouth daily.    Marland Kitchen aspirin 81 MG tablet Take 81 mg by mouth daily.    Marland Kitchen atorvastatin (LIPITOR) 10 MG tablet Take 10 mg by mouth daily.    . clonazePAM (KLONOPIN) 0.5 MG tablet Take 0.5 mg by mouth as needed for anxiety.    . furosemide (LASIX) 40 MG tablet Take 40 mg by mouth daily. Reported on 12/17/2015    . hydrochlorothiazide (HYDRODIURIL) 25 MG tablet Take 25 mg by mouth daily.    Marland Kitchen losartan (COZAAR) 50 MG tablet Take 1 tablet by mouth daily.    . Naproxen Sodium (ALEVE) 220 MG CAPS Take by mouth daily.    . potassium chloride (K-DUR) 10 MEQ tablet     . Vitamin D, Ergocalciferol, (DRISDOL) 50000 UNITS CAPS Take 50,000 Units by mouth every 7 (seven) days.    Marland Kitchen  zolpidem (AMBIEN) 10 MG tablet Take 10 mg by mouth at bedtime as needed for sleep.     No current facility-administered medications for this visit.    Family History  Problem Relation Age of Onset  . Adopted: Yes  . Heart attack Maternal Uncle     ? unknown if correct/patient adopted    ROS:  Pertinent items are noted in HPI.  Otherwise, a comprehensive ROS was negative.  Exam:   BP 132/62 mmHg  Pulse 70  Resp 16  Ht 5\' 4"  (1.626 m)  Wt 186 lb (84.369 kg)  BMI 31.91 kg/m2  LMP 11/14/2003  Weight change: +2# Height: 5\' 4"  (162.6 cm)  Ht Readings from Last 3 Encounters:  12/17/15 5\' 4"  (1.626 m)  11/04/14 5' 3.75" (1.619 m)  02/18/13 5' 4.5" (1.638 m)    General appearance: alert, cooperative and appears stated age Head: Normocephalic, without obvious abnormality, atraumatic Neck: no adenopathy, supple, symmetrical, trachea midline and thyroid normal to inspection and palpation Lungs: clear to auscultation bilaterally Breasts: normal appearance, no masses or tenderness Heart: regular rate and rhythm Abdomen: soft, non-tender; bowel sounds normal; no masses,  no organomegaly Extremities: extremities normal,  atraumatic, no cyanosis or edema Skin: Skin color, texture, turgor normal. No rashes or lesions Lymph nodes: Cervical, supraclavicular, and axillary nodes normal. No abnormal inguinal nodes palpated Neurologic: Grossly normal   Pelvic: External genitalia:  no lesions              Urethra:  normal appearing urethra with no masses, tenderness or lesions              Bartholins and Skenes: normal                 Vagina: normal appearing vagina with normal color and discharge, no lesions              Cervix: no lesions              Pap taken: Yes.   Bimanual Exam:  Uterus:  normal size, contour, position, consistency, mobility, non-tender              Adnexa: normal adnexa and no mass, fullness, tenderness               Rectovaginal: Confirms               Anus:  normal  sphincter tone, no lesions  Chaperone was present for exam.  A:  Well Woman with normal exam, PMP no HRT Borderline diabetes Elevated lipids Hypertension  P: Mammogram yearly pap smear 2014. Pap today. Labs with Darcus Austin.  Has appt next week. return annually or prn

## 2015-12-20 LAB — IPS PAP SMEAR ONLY

## 2015-12-22 DIAGNOSIS — Z1159 Encounter for screening for other viral diseases: Secondary | ICD-10-CM | POA: Diagnosis not present

## 2015-12-22 DIAGNOSIS — I10 Essential (primary) hypertension: Secondary | ICD-10-CM | POA: Diagnosis not present

## 2015-12-22 DIAGNOSIS — E7800 Pure hypercholesterolemia, unspecified: Secondary | ICD-10-CM | POA: Diagnosis not present

## 2015-12-22 DIAGNOSIS — E669 Obesity, unspecified: Secondary | ICD-10-CM | POA: Diagnosis not present

## 2015-12-22 DIAGNOSIS — G4700 Insomnia, unspecified: Secondary | ICD-10-CM | POA: Diagnosis not present

## 2015-12-22 DIAGNOSIS — E119 Type 2 diabetes mellitus without complications: Secondary | ICD-10-CM | POA: Diagnosis not present

## 2015-12-24 DIAGNOSIS — F4323 Adjustment disorder with mixed anxiety and depressed mood: Secondary | ICD-10-CM | POA: Diagnosis not present

## 2016-01-16 DIAGNOSIS — J069 Acute upper respiratory infection, unspecified: Secondary | ICD-10-CM | POA: Diagnosis not present

## 2016-01-26 DIAGNOSIS — F4323 Adjustment disorder with mixed anxiety and depressed mood: Secondary | ICD-10-CM | POA: Diagnosis not present

## 2016-02-21 DIAGNOSIS — R21 Rash and other nonspecific skin eruption: Secondary | ICD-10-CM | POA: Diagnosis not present

## 2016-02-21 DIAGNOSIS — F419 Anxiety disorder, unspecified: Secondary | ICD-10-CM | POA: Diagnosis not present

## 2016-03-06 DIAGNOSIS — H524 Presbyopia: Secondary | ICD-10-CM | POA: Diagnosis not present

## 2016-03-06 DIAGNOSIS — E119 Type 2 diabetes mellitus without complications: Secondary | ICD-10-CM | POA: Diagnosis not present

## 2016-03-06 DIAGNOSIS — H52223 Regular astigmatism, bilateral: Secondary | ICD-10-CM | POA: Diagnosis not present

## 2016-03-06 DIAGNOSIS — Z7984 Long term (current) use of oral hypoglycemic drugs: Secondary | ICD-10-CM | POA: Diagnosis not present

## 2016-03-06 DIAGNOSIS — H5203 Hypermetropia, bilateral: Secondary | ICD-10-CM | POA: Diagnosis not present

## 2016-03-16 DIAGNOSIS — F4323 Adjustment disorder with mixed anxiety and depressed mood: Secondary | ICD-10-CM | POA: Diagnosis not present

## 2016-03-29 DIAGNOSIS — E119 Type 2 diabetes mellitus without complications: Secondary | ICD-10-CM | POA: Diagnosis not present

## 2016-03-29 DIAGNOSIS — E669 Obesity, unspecified: Secondary | ICD-10-CM | POA: Diagnosis not present

## 2016-03-29 DIAGNOSIS — Z6830 Body mass index (BMI) 30.0-30.9, adult: Secondary | ICD-10-CM | POA: Diagnosis not present

## 2016-03-29 DIAGNOSIS — I10 Essential (primary) hypertension: Secondary | ICD-10-CM | POA: Diagnosis not present

## 2016-03-29 DIAGNOSIS — F419 Anxiety disorder, unspecified: Secondary | ICD-10-CM | POA: Diagnosis not present

## 2016-03-29 DIAGNOSIS — E7800 Pure hypercholesterolemia, unspecified: Secondary | ICD-10-CM | POA: Diagnosis not present

## 2016-06-22 DIAGNOSIS — D2362 Other benign neoplasm of skin of left upper limb, including shoulder: Secondary | ICD-10-CM | POA: Diagnosis not present

## 2016-06-22 DIAGNOSIS — D2371 Other benign neoplasm of skin of right lower limb, including hip: Secondary | ICD-10-CM | POA: Diagnosis not present

## 2016-06-22 DIAGNOSIS — D1801 Hemangioma of skin and subcutaneous tissue: Secondary | ICD-10-CM | POA: Diagnosis not present

## 2016-06-22 DIAGNOSIS — L821 Other seborrheic keratosis: Secondary | ICD-10-CM | POA: Diagnosis not present

## 2016-07-31 DIAGNOSIS — R51 Headache: Secondary | ICD-10-CM | POA: Diagnosis not present

## 2016-07-31 DIAGNOSIS — I10 Essential (primary) hypertension: Secondary | ICD-10-CM | POA: Diagnosis not present

## 2016-07-31 DIAGNOSIS — F419 Anxiety disorder, unspecified: Secondary | ICD-10-CM | POA: Diagnosis not present

## 2016-07-31 DIAGNOSIS — E119 Type 2 diabetes mellitus without complications: Secondary | ICD-10-CM | POA: Diagnosis not present

## 2016-08-06 DIAGNOSIS — R51 Headache: Secondary | ICD-10-CM | POA: Diagnosis not present

## 2016-08-06 DIAGNOSIS — F411 Generalized anxiety disorder: Secondary | ICD-10-CM | POA: Diagnosis not present

## 2016-08-10 DIAGNOSIS — F310 Bipolar disorder, current episode hypomanic: Secondary | ICD-10-CM | POA: Diagnosis not present

## 2016-08-17 DIAGNOSIS — I776 Arteritis, unspecified: Secondary | ICD-10-CM | POA: Diagnosis not present

## 2016-08-17 DIAGNOSIS — F419 Anxiety disorder, unspecified: Secondary | ICD-10-CM | POA: Diagnosis not present

## 2016-09-20 DIAGNOSIS — F4323 Adjustment disorder with mixed anxiety and depressed mood: Secondary | ICD-10-CM | POA: Diagnosis not present

## 2016-09-29 DIAGNOSIS — Z23 Encounter for immunization: Secondary | ICD-10-CM | POA: Diagnosis not present

## 2016-10-24 DIAGNOSIS — F4323 Adjustment disorder with mixed anxiety and depressed mood: Secondary | ICD-10-CM | POA: Diagnosis not present

## 2016-10-30 DIAGNOSIS — L299 Pruritus, unspecified: Secondary | ICD-10-CM | POA: Diagnosis not present

## 2016-10-30 DIAGNOSIS — H6121 Impacted cerumen, right ear: Secondary | ICD-10-CM | POA: Diagnosis not present

## 2016-12-18 ENCOUNTER — Encounter: Payer: Self-pay | Admitting: Neurology

## 2016-12-18 ENCOUNTER — Ambulatory Visit (INDEPENDENT_AMBULATORY_CARE_PROVIDER_SITE_OTHER): Payer: Medicare Other | Admitting: Neurology

## 2016-12-18 DIAGNOSIS — G4489 Other headache syndrome: Secondary | ICD-10-CM | POA: Diagnosis not present

## 2016-12-18 HISTORY — DX: Other headache syndrome: G44.89

## 2016-12-18 MED ORDER — GABAPENTIN 100 MG PO CAPS: 100.0000 mg | ORAL_CAPSULE | Freq: Three times a day (TID) | ORAL | 3 refills | Status: DC

## 2016-12-18 NOTE — Patient Instructions (Signed)
   Neurontin (gabapentin) may result in drowsiness, ankle swelling, gait instability, or possibly dizziness. Please contact our office if significant side effects occur with this medication.  

## 2016-12-18 NOTE — Progress Notes (Signed)
Reason for visit: Headache  Referring physician: Dr. Al Decant is a 71 y.o. female  History of present illness:  Amanda Collier is a 71 year old right-handed white female with a history of daily headaches that began in July 2017. The patient claims that she has never really had headaches throughout her life, the headaches are unusual for her. The patient has primarily occipital headaches without any associated neck stiffness or neck spasm. The patient describes the headache as a constant dull achy pain. The patient has no associated visual complaints or any nausea or vomiting. The patient denies photophobia or phonophobia with the headache. The patient has no numbness or weakness of extremities, she denies any balance issues or difficulty controlling the bowels or the bladder. The patient may take up to 3 Aleve tablets daily which seems to help some with the headache. She denies any excessive caffeine intake. The patient denies any clouding of consciousness, syncope, or difficulty with speech or swallowing. She is sent to this office for an evaluation.  Past Medical History:  Diagnosis Date  . Headache syndrome 12/18/2016  . High blood pressure   . Hypertension    followed by Dr Gillian Shields medications  . Leg swelling    Hx of    Past Surgical History:  Procedure Laterality Date  . NO PAST SURGERIES      Family History  Problem Relation Age of Onset  . Adopted: Yes  . Heart attack Maternal Uncle     ? unknown if correct/patient adopted    Social history:  reports that she has never smoked. She has never used smokeless tobacco. She reports that she drinks about 4.2 oz of alcohol per week . She reports that she does not use drugs.  Medications:  Prior to Admission medications   Medication Sig Start Date End Date Taking? Authorizing Provider  amLODipine (NORVASC) 5 MG tablet Take 5 mg by mouth daily.   Yes Historical Provider, MD  aspirin 81 MG tablet Take  81 mg by mouth daily.   Yes Historical Provider, MD  atorvastatin (LIPITOR) 10 MG tablet Take 10 mg by mouth daily.   Yes Historical Provider, MD  clonazePAM (KLONOPIN) 0.5 MG tablet Take 0.5 mg by mouth as needed for anxiety.   Yes Historical Provider, MD  Divalproex Sodium (DEPAKOTE PO) Take 1 Dose by mouth daily. Can take up to three times daily   Yes Historical Provider, MD  furosemide (LASIX) 40 MG tablet Take 40 mg by mouth daily. Reported on 12/17/2015   Yes Historical Provider, MD  hydrochlorothiazide (HYDRODIURIL) 25 MG tablet Take 25 mg by mouth daily.   Yes Historical Provider, MD  losartan (COZAAR) 50 MG tablet Take 1 tablet by mouth daily. 08/24/15  Yes Historical Provider, MD  Naproxen Sodium (ALEVE) 220 MG CAPS Take by mouth daily.   Yes Historical Provider, MD  potassium chloride (K-DUR) 10 MEQ tablet  05/13/15  Yes Historical Provider, MD  Vitamin D, Ergocalciferol, (DRISDOL) 50000 UNITS CAPS Take 50,000 Units by mouth every 7 (seven) days.   Yes Historical Provider, MD  zolpidem (AMBIEN) 10 MG tablet Take 10 mg by mouth at bedtime as needed for sleep.   Yes Historical Provider, MD  gabapentin (NEURONTIN) 100 MG capsule Take 1 capsule (100 mg total) by mouth 3 (three) times daily. 12/18/16   Kathrynn Ducking, MD      Allergies  Allergen Reactions  . Codeine     nausea  . Lexapro [  Escitalopram Oxalate]     GI upset  . Lisinopril     Cough   . Lotensin [Benazepril Hcl]     headaches    ROS:  Out of a complete 14 system review of symptoms, the patient complains only of the following symptoms, and all other reviewed systems are negative.  Headache  Blood pressure (!) 162/77, pulse 80, height 5\' 4"  (1.626 m), weight 190 lb 8 oz (86.4 kg), last menstrual period 11/14/2003.  Physical Exam  General: The patient is alert and cooperative at the time of the examination.  Eyes: Pupils are equal, round, and reactive to light. Discs are flat bilaterally.  Neck: The neck is  supple, no carotid bruits are noted.  Respiratory: The respiratory examination is clear.  Cardiovascular: The cardiovascular examination reveals a regular rate and rhythm, no obvious murmurs or rubs are noted.  Neuromuscular: The patient lacks about 30 of lateral rotation to the right, 15 to the left with the cervical spine.  Skin: Extremities are without significant edema.  Neurologic Exam  Mental status: The patient is alert and oriented x 3 at the time of the examination. The patient has apparent normal recent and remote memory, with an apparently normal attention span and concentration ability.  Cranial nerves: Facial symmetry is present. There is good sensation of the face to pinprick and soft touch bilaterally. The strength of the facial muscles and the muscles to head turning and shoulder shrug are normal bilaterally. Speech is well enunciated, no aphasia or dysarthria is noted. Extraocular movements are full. Visual fields are full. The tongue is midline, and the patient has symmetric elevation of the soft palate. No obvious hearing deficits are noted.  Motor: The motor testing reveals 5 over 5 strength of all 4 extremities. Good symmetric motor tone is noted throughout.  Sensory: Sensory testing is intact to pinprick, soft touch, vibration sensation, and position sense on all 4 extremities. No evidence of extinction is noted.  Coordination: Cerebellar testing reveals good finger-nose-finger and heel-to-shin bilaterally.  Gait and station: Gait is normal. Tandem gait is normal. Romberg is negative. No drift is seen.  Reflexes: Deep tendon reflexes are symmetric and normal bilaterally. Toes are downgoing bilaterally.   Assessment/Plan:  1. Occipital headache  The patient has chronic daily headaches, she has no prior history of headache throughout her life. The patient will be sent for a sedimentation rate today, and MRI of the brain. She will be placed on low-dose gabapentin,  she will call for dose adjustments of the medication. She will follow-up in about 3 months.  Jill Alexanders MD 12/18/2016 2:30 PM  [facility] [address] [place],  [address]  Phone [phone] Fax [phone]

## 2016-12-19 ENCOUNTER — Telehealth: Payer: Self-pay | Admitting: *Deleted

## 2016-12-19 LAB — SEDIMENTATION RATE: Sed Rate: 3 mm/hr (ref 0–40)

## 2016-12-19 NOTE — Telephone Encounter (Signed)
-----   Message from Kathrynn Ducking, MD sent at 12/19/2016  7:25 AM EST -----  The blood work results are unremarkable. Please call the patient.  ----- Message ----- From: Lavone Neri Lab Results In Sent: 12/19/2016   5:40 AM To: Kathrynn Ducking, MD

## 2016-12-19 NOTE — Telephone Encounter (Signed)
Called and LVM for pt about lab results per CW,MD note. Gave [facility] phone number if she has further questions/concerns.

## 2016-12-19 NOTE — Telephone Encounter (Signed)
Called and LVM for pt advising Dr Jannifer Franklin checked sedimentation rate (looks for inflammation). Did not do hemoglobin A1C. Gave [facility] phone number if she has further questions or concerns.

## 2016-12-19 NOTE — Telephone Encounter (Signed)
Pt is wanting to know if labs tested A1C? Please call

## 2017-01-03 ENCOUNTER — Ambulatory Visit
Admission: RE | Admit: 2017-01-03 | Discharge: 2017-01-03 | Disposition: A | Discharge status: Home / Self Care | Payer: Medicare Other | Source: Ambulatory Visit | Attending: Neurology | Admitting: Neurology

## 2017-01-03 DIAGNOSIS — G4489 Other headache syndrome: Secondary | ICD-10-CM

## 2017-01-03 DIAGNOSIS — R51 Headache: Secondary | ICD-10-CM | POA: Diagnosis not present

## 2017-01-04 ENCOUNTER — Telehealth: Payer: Self-pay | Admitting: Neurology

## 2017-01-04 NOTE — Telephone Encounter (Signed)
I called patient. The MRI the brain shows mild to moderate small vessel ischemic changes. The patient does have hypertension, she is on low-dose aspirin. She has been placed on gabapentin for the headache, if she is tolerating this but requires a dose adjustment, she will call our office.    MRI brain 01/03/17:  IMPRESSION:  Abnormal MRI brain (without) demonstrating: 1. Scattered periventricular and subcortical foci of non-specific gliosis. Considerations include autoimmune, inflammatory, post-infectious, microvascular ischemic or migraine associated etiologies.  2. No acute findings.

## 2017-01-05 NOTE — Telephone Encounter (Signed)
I called the patient. The patient can double the dose of the gabapentin if she is tolerating it and still having headaches. We will eventually try to work her up to a 300 milligrams 3 times a day dosing.  MRI the brain did show a moderate level small vessel disease changes, she is on low-dose aspirin.

## 2017-01-05 NOTE — Telephone Encounter (Signed)
Pt called said she wants to know what will be the change in dosing and when should she return and wants to discuss MRI results in detail

## 2017-01-05 NOTE — Telephone Encounter (Signed)
Dr Willis- please advise 

## 2017-01-08 NOTE — Telephone Encounter (Signed)
I called patient. I went over the results of the MRI the brain with her. I indicated that she is to remain on low-dose aspirin, control blood pressure as well as possible. She will go up on gabapentin taking 200 mg 3 times a day, she will call if she needs a higher dose, we will go to 300 mg 3 times daily. She continues to have headaches.

## 2017-01-08 NOTE — Telephone Encounter (Signed)
Pt returned providers call. She can be reached at [phone]

## 2017-01-09 ENCOUNTER — Telehealth: Payer: Self-pay | Admitting: *Deleted

## 2017-01-09 NOTE — Telephone Encounter (Signed)
Called and LVM for pt to call and schedule 3 month f/u with NP per CW,MD request. Advised she can schedule with phone staff when she calls.  Gave [facility] phone number.

## 2017-01-10 NOTE — Telephone Encounter (Signed)
Called and LVM on mobile number to call and schedule f/u.  Called and spoke to husband. He said [place] was [place] and will be out later today. He said it is best to reach her by mobile. Advised I LVM and will wait to hear back from her.

## 2017-01-11 NOTE — Telephone Encounter (Signed)
Checked on status of scheduling. Pt called and scheduled appt for 03/21/17 with CM,NP at 1245pm.

## 2017-02-13 DIAGNOSIS — Z1231 Encounter for screening mammogram for malignant neoplasm of breast: Secondary | ICD-10-CM | POA: Diagnosis not present

## 2017-02-21 DIAGNOSIS — Z23 Encounter for immunization: Secondary | ICD-10-CM | POA: Diagnosis not present

## 2017-02-21 DIAGNOSIS — E559 Vitamin D deficiency, unspecified: Secondary | ICD-10-CM | POA: Diagnosis not present

## 2017-02-21 DIAGNOSIS — E7800 Pure hypercholesterolemia, unspecified: Secondary | ICD-10-CM | POA: Diagnosis not present

## 2017-02-21 DIAGNOSIS — E669 Obesity, unspecified: Secondary | ICD-10-CM | POA: Diagnosis not present

## 2017-02-21 DIAGNOSIS — I10 Essential (primary) hypertension: Secondary | ICD-10-CM | POA: Diagnosis not present

## 2017-02-21 DIAGNOSIS — E119 Type 2 diabetes mellitus without complications: Secondary | ICD-10-CM | POA: Diagnosis not present

## 2017-03-09 ENCOUNTER — Ambulatory Visit (INDEPENDENT_AMBULATORY_CARE_PROVIDER_SITE_OTHER): Payer: Medicare Other | Admitting: Neurology

## 2017-03-09 ENCOUNTER — Encounter: Payer: Self-pay | Admitting: Neurology

## 2017-03-09 VITALS — BP 162/92 | HR 87 | Ht 64.0 in | Wt 191.5 lb

## 2017-03-09 DIAGNOSIS — G4489 Other headache syndrome: Secondary | ICD-10-CM

## 2017-03-09 MED ORDER — NORTRIPTYLINE HCL 10 MG PO CAPS: ORAL_CAPSULE | ORAL | 3 refills | Status: DC

## 2017-03-09 NOTE — Progress Notes (Signed)
Reason for visit: Headache  Amanda Collier is an 71 y.o. female  History of present illness:  Amanda Collier is a 71 year old right-handed white female with a history of headaches that began in July 2017 and have been daily and persistent since that time. The headaches are very low-grade, dull aching pain in the back of the head. The patient denies a neck stiffness or shoulder discomfort. The patient has not had any nausea or vomiting with the headache. She was placed on gabapentin in low dose, but she could not tolerate the drug secondary to diarrhea. The patient has had MRI evaluation of the brain that showed a moderate level small vessel disease, likely related to chronic hypertension. A sedimentation rate was normal. The patient returns to the office today for an evaluation. The headaches do not keep her from doing anything.   Past Medical History:  Diagnosis Date  . Headache syndrome 12/18/2016  . High blood pressure   . Hypertension    followed by Dr Gillian Shields medications  . Leg swelling    Hx of    Past Surgical History:  Procedure Laterality Date  . NO PAST SURGERIES      Family History  Problem Relation Age of Onset  . Adopted: Yes  . Heart attack Maternal Uncle     ? unknown if correct/patient adopted    Social history:  reports that she has never smoked. She has never used smokeless tobacco. She reports that she drinks about 4.2 oz of alcohol per week . She reports that she does not use drugs.    Allergies  Allergen Reactions  . Codeine     nausea  . Lexapro [Escitalopram Oxalate]     GI upset  . Lisinopril     Cough   . Lotensin [Benazepril Hcl]     headaches    Medications:  Prior to Admission medications   Medication Sig Start Date End Date Taking? Authorizing Provider  amLODipine (NORVASC) 5 MG tablet Take 5 mg by mouth daily.   Yes Historical Provider, MD  aspirin 81 MG tablet Take 81 mg by mouth daily.   Yes Historical Provider, MD    atorvastatin (LIPITOR) 10 MG tablet Take 10 mg by mouth daily.   Yes Historical Provider, MD  clonazePAM (KLONOPIN) 0.5 MG tablet Take 0.5 mg by mouth as needed for anxiety.   Yes Historical Provider, MD  Divalproex Sodium (DEPAKOTE PO) Take 1 Dose by mouth daily. Can take up to three times daily   Yes Historical Provider, MD  furosemide (LASIX) 40 MG tablet Take 40 mg by mouth daily. Reported on 12/17/2015   Yes Historical Provider, MD  gabapentin (NEURONTIN) 100 MG capsule Take 1 capsule (100 mg total) by mouth 3 (three) times daily. 12/18/16  Yes Kathrynn Ducking, MD  hydrochlorothiazide (HYDRODIURIL) 25 MG tablet Take 25 mg by mouth daily.   Yes Historical Provider, MD  losartan (COZAAR) 50 MG tablet Take 1 tablet by mouth daily. 08/24/15  Yes Historical Provider, MD  Naproxen Sodium (ALEVE) 220 MG CAPS Take by mouth daily.   Yes Historical Provider, MD  potassium chloride (K-DUR) 10 MEQ tablet  05/13/15  Yes Historical Provider, MD  Vitamin D, Ergocalciferol, (DRISDOL) 50000 UNITS CAPS Take 50,000 Units by mouth every 7 (seven) days.   Yes Historical Provider, MD  zolpidem (AMBIEN) 10 MG tablet Take 10 mg by mouth at bedtime as needed for sleep.   Yes Historical Provider, MD  ROS:  Out of a complete 14 system review of symptoms, the patient complains only of the following symptoms, and all other reviewed systems are negative.  Diarrhea Headache  Blood pressure (!) 162/92, pulse 87, height 5\' 4"  (1.626 m), weight 191 lb 8 oz (86.9 kg), last menstrual period 11/14/2003.  Physical Exam  General: The patient is alert and cooperative at the time of the examination.  Neuromuscular: The patient lacks about 10-15 of full lateral rotation of the cervical spine bilaterally.  Skin: No significant peripheral edema is noted.   Neurologic Exam  Mental status: The patient is alert and oriented x 3 at the time of the examination. The patient has apparent normal recent and remote memory, with an  apparently normal attention span and concentration ability.   Cranial nerves: Facial symmetry is present. Speech is normal, no aphasia or dysarthria is noted. Extraocular movements are full. Visual fields are full.  Motor: The patient has good strength in all 4 extremities.  Sensory examination: Soft touch sensation is symmetric on the face, arms, and legs.  Coordination: The patient has good finger-nose-finger and heel-to-shin bilaterally.  Gait and station: The patient has a normal gait. Tandem gait is normal. Romberg is negative. No drift is seen.  Reflexes: Deep tendon reflexes are symmetric.   01/04/17 MRI brain:  IMPRESSION:  Abnormal MRI brain (without) demonstrating: 1. Scattered periventricular and subcortical foci of non-specific gliosis. Considerations include autoimmune, inflammatory, post-infectious, microvascular ischemic or migraine associated etiologies.  2. No acute findings.  * MRI scan images were reviewed online. I agree with the written report.    Assessment/Plan:  1. Chronic daily headache  The patient could not tolerate gabapentin, she will stop this medication, we will start low-dose nortriptyline. The patient will go to [place] state for the summer, she will be back in September. She will call me if she has any issues regarding the medication therapy. Otherwise, she will follow-up in 4 months.  Jill Alexanders MD 03/09/2017 11:06 AM  [facility] [address] [place], [place] [address]  Phone [phone] Fax [phone]

## 2017-03-09 NOTE — Patient Instructions (Signed)
   Stop the gabapentin and start nortriptyline.   Pamelor (nortriptyline) is an antidepressant medication that has many uses that may include headache, whiplash injuries, or for peripheral neuropathy pain. Side effects may include drowsiness, dry mouth, blurred vision, or constipation. As with any antidepressant medication, worsening depression may occur. If you had any significant side effects, please call our office. The full effects of this medication may take 7-10 days after starting the drug, or going up on the dose.

## 2017-03-15 ENCOUNTER — Encounter: Payer: Self-pay | Admitting: Obstetrics & Gynecology

## 2017-03-21 ENCOUNTER — Ambulatory Visit: Payer: Medicare Other | Admitting: Nurse Practitioner

## 2017-03-30 ENCOUNTER — Ambulatory Visit: Payer: Medicare Other | Admitting: Obstetrics & Gynecology

## 2017-04-05 DIAGNOSIS — I10 Essential (primary) hypertension: Secondary | ICD-10-CM | POA: Diagnosis not present

## 2017-07-05 DIAGNOSIS — D1801 Hemangioma of skin and subcutaneous tissue: Secondary | ICD-10-CM | POA: Diagnosis not present

## 2017-07-05 DIAGNOSIS — L821 Other seborrheic keratosis: Secondary | ICD-10-CM | POA: Diagnosis not present

## 2017-07-05 DIAGNOSIS — L570 Actinic keratosis: Secondary | ICD-10-CM | POA: Diagnosis not present

## 2017-07-05 DIAGNOSIS — D235 Other benign neoplasm of skin of trunk: Secondary | ICD-10-CM | POA: Diagnosis not present

## 2017-07-19 DIAGNOSIS — R609 Edema, unspecified: Secondary | ICD-10-CM | POA: Diagnosis not present

## 2017-07-24 ENCOUNTER — Other Ambulatory Visit (HOSPITAL_COMMUNITY): Payer: Self-pay | Admitting: Family Medicine

## 2017-07-24 ENCOUNTER — Ambulatory Visit (HOSPITAL_COMMUNITY)
Admission: RE | Admit: 2017-07-24 | Discharge: 2017-07-24 | Disposition: A | Discharge status: Home / Self Care | Payer: Medicare Other | Source: Ambulatory Visit | Attending: Vascular Surgery | Admitting: Vascular Surgery

## 2017-07-24 DIAGNOSIS — M7989 Other specified soft tissue disorders: Secondary | ICD-10-CM | POA: Insufficient documentation

## 2017-07-25 ENCOUNTER — Other Ambulatory Visit: Payer: Self-pay | Admitting: *Deleted

## 2017-07-25 DIAGNOSIS — F4323 Adjustment disorder with mixed anxiety and depressed mood: Secondary | ICD-10-CM | POA: Diagnosis not present

## 2017-07-25 MED ORDER — NORTRIPTYLINE HCL 10 MG PO CAPS: ORAL_CAPSULE | ORAL | 3 refills | Status: DC

## 2017-08-02 DIAGNOSIS — I10 Essential (primary) hypertension: Secondary | ICD-10-CM | POA: Diagnosis not present

## 2017-08-02 DIAGNOSIS — R609 Edema, unspecified: Secondary | ICD-10-CM | POA: Diagnosis not present

## 2017-08-03 ENCOUNTER — Other Ambulatory Visit: Payer: Self-pay | Admitting: Family Medicine

## 2017-08-03 DIAGNOSIS — R609 Edema, unspecified: Secondary | ICD-10-CM

## 2017-08-08 ENCOUNTER — Ambulatory Visit: Payer: Medicare Other | Admitting: Neurology

## 2017-08-10 ENCOUNTER — Other Ambulatory Visit: Payer: Self-pay

## 2017-08-10 ENCOUNTER — Ambulatory Visit (HOSPITAL_COMMUNITY): Payer: Medicare Other | Attending: Cardiovascular Disease

## 2017-08-10 DIAGNOSIS — R609 Edema, unspecified: Secondary | ICD-10-CM | POA: Diagnosis not present

## 2017-08-10 DIAGNOSIS — I371 Nonrheumatic pulmonary valve insufficiency: Secondary | ICD-10-CM | POA: Insufficient documentation

## 2017-08-10 DIAGNOSIS — M7989 Other specified soft tissue disorders: Secondary | ICD-10-CM | POA: Diagnosis not present

## 2017-08-10 DIAGNOSIS — I10 Essential (primary) hypertension: Secondary | ICD-10-CM | POA: Diagnosis not present

## 2017-08-10 DIAGNOSIS — I071 Rheumatic tricuspid insufficiency: Secondary | ICD-10-CM | POA: Diagnosis not present

## 2017-08-13 DIAGNOSIS — I10 Essential (primary) hypertension: Secondary | ICD-10-CM | POA: Diagnosis not present

## 2017-08-13 DIAGNOSIS — R609 Edema, unspecified: Secondary | ICD-10-CM | POA: Diagnosis not present

## 2017-08-20 ENCOUNTER — Encounter: Payer: Self-pay | Admitting: Obstetrics & Gynecology

## 2017-08-20 ENCOUNTER — Ambulatory Visit (INDEPENDENT_AMBULATORY_CARE_PROVIDER_SITE_OTHER): Payer: Medicare Other | Admitting: Obstetrics & Gynecology

## 2017-08-20 VITALS — BP 122/66 | HR 88 | Resp 16 | Ht 63.5 in | Wt 184.0 lb

## 2017-08-20 DIAGNOSIS — Z01419 Encounter for gynecological examination (general) (routine) without abnormal findings: Secondary | ICD-10-CM

## 2017-08-20 NOTE — Progress Notes (Signed)
71 y.o. G2P2 MarriedCaucasianF here for annual exam.  Doing well.   Denies vaginal bleeding.  Newer issue has been intermittent chronic headaches.  MRI was done showing possible microvascular disease.  Has second home in [place].  Goes there twice weekly.  Had increased edema.  Dopplers were negative.  Echocardiogram was also normal.   Son had suicidal issues this year.  He is improved but has chronic depression.    PCP:  Dr. Darcus [place].    Patient's last menstrual period was 11/14/2003.          Sexually active: Yes.    The current method of family planning is post menopausal status.    Exercising: No.  The patient does not participate in regular exercise at present. Smoker:  no  Health Maintenance: Pap:  12/17/15 negative, 02/18/13 negative  History of abnormal Pap:  no MMG:  02/13/17 BIRADS 1 negative   Colonoscopy:  03/19/13- polyps- repeat 5 years.  BMD:   04/10/13 normal  TDaP:  Current.  Pt feels certain this uptdate.  Pneumonia vaccine(s):  PCP Zostavax:   Done Hep C testing: PCP Screening Labs: PCP   reports that she has never smoked. She has never used smokeless tobacco. She reports that she drinks about 4.2 oz of alcohol per week . She reports that she does not use drugs.  Past Medical History:  Diagnosis Date  . Headache syndrome 12/18/2016  . High blood pressure   . Hypertension    followed by Dr Gillian Shields medications  . Leg swelling    Hx of    Past Surgical History:  Procedure Laterality Date  . NO PAST SURGERIES      Current Outpatient Prescriptions  Medication Sig Dispense Refill  . amLODipine (NORVASC) 5 MG tablet Take 5 mg by mouth daily.    Marland Kitchen aspirin 81 MG tablet Take 81 mg by mouth daily.    Marland Kitchen atorvastatin (LIPITOR) 10 MG tablet Take 10 mg by mouth daily.    . clonazePAM (KLONOPIN) 0.5 MG tablet Take 0.5 mg by mouth as needed for anxiety.    . Divalproex Sodium (DEPAKOTE PO) Take 1 Dose by mouth daily. Can take up to three times daily    .  furosemide (LASIX) 40 MG tablet Take 40 mg by mouth daily. Reported on 12/17/2015    . hydrochlorothiazide (HYDRODIURIL) 25 MG tablet Take 25 mg by mouth daily.    Marland Kitchen losartan (COZAAR) 100 MG tablet Take 1 tablet by mouth daily.  1  . losartan (COZAAR) 50 MG tablet Take 1 tablet by mouth daily.    . Naproxen Sodium (ALEVE) 220 MG CAPS Take by mouth daily.    . nortriptyline (PAMELOR) 10 MG capsule 3 capsules by mouth at night 90 capsule 3  . potassium chloride (K-DUR) 10 MEQ tablet     . Vitamin D, Ergocalciferol, (DRISDOL) 50000 UNITS CAPS Take 50,000 Units by mouth every 7 (seven) days.    Marland Kitchen zolpidem (AMBIEN) 10 MG tablet Take 10 mg by mouth at bedtime as needed for sleep.     No current facility-administered medications for this visit.     Family History  Problem Relation Age of Onset  . Adopted: Yes  . Heart attack Maternal Uncle        ? unknown if correct/patient adopted    ROS:  Pertinent items are noted in HPI.  Otherwise, a comprehensive ROS was negative.  Exam:   BP 122/66 (BP Location: Left Arm, Patient Position: Sitting,  Cuff Size: Large)   Pulse 88   Resp 16   Ht 5' 3.5" (1.613 m)   Wt 184 lb (83.5 kg)   LMP 11/14/2003   BMI 32.08 kg/m     Height: 5' 3.5" (161.3 cm)  Ht Readings from Last 3 Encounters:  08/20/17 5' 3.5" (1.613 m)  03/09/17 5\' 4"  (1.626 m)  12/18/16 5\' 4"  (1.626 m)    General appearance: alert, cooperative and appears stated age Head: Normocephalic, without obvious abnormality, atraumatic Neck: no adenopathy, supple, symmetrical, trachea midline and thyroid normal to inspection and palpation Lungs: clear to auscultation bilaterally Breasts: normal appearance, no masses or tenderness Heart: regular rate and rhythm Abdomen: soft, non-tender; bowel sounds normal; no masses,  no organomegaly Extremities: extremities normal, atraumatic, no cyanosis or edema Skin: Skin color, texture, turgor normal. No rashes or lesions Lymph nodes: Cervical,  supraclavicular, and axillary nodes normal. No abnormal inguinal nodes palpated Neurologic: Grossly normal   Pelvic: External genitalia:  no lesions              Urethra:  normal appearing urethra with no masses, tenderness or lesions              Bartholins and Skenes: normal                 Vagina: normal appearing vagina with normal color and discharge, no lesions              Cervix: no lesions              Pap taken: No. Bimanual Exam:  Uterus:  uterus absent              Adnexa: normal adnexa and no mass, fullness, tenderness               Rectovaginal: Confirms               Anus:  normal sphincter tone, no lesions  Chaperone was present for exam.  A:  Well Woman with normal exam PMP, no HRT Borderline diabetes Elevated lipids Hypertension Headaches this past year, followed by Dr. Jannifer [place] Personal stressors with son's depression  P:   Mammogram guidelines reviewed.  Pt plans continue yearly Lab work done with PCP pap smear 2/17.  No pap obtained today.   return annually or prn

## 2017-09-06 DIAGNOSIS — Z23 Encounter for immunization: Secondary | ICD-10-CM | POA: Diagnosis not present

## 2017-09-20 DIAGNOSIS — E119 Type 2 diabetes mellitus without complications: Secondary | ICD-10-CM | POA: Diagnosis not present

## 2017-09-20 DIAGNOSIS — E669 Obesity, unspecified: Secondary | ICD-10-CM | POA: Diagnosis not present

## 2017-09-20 DIAGNOSIS — E7800 Pure hypercholesterolemia, unspecified: Secondary | ICD-10-CM | POA: Diagnosis not present

## 2017-09-20 DIAGNOSIS — I10 Essential (primary) hypertension: Secondary | ICD-10-CM | POA: Diagnosis not present

## 2017-09-24 ENCOUNTER — Ambulatory Visit: Payer: Medicare Other | Admitting: Neurology

## 2017-10-25 ENCOUNTER — Other Ambulatory Visit: Payer: Self-pay | Admitting: Neurology

## 2017-10-30 DIAGNOSIS — F4323 Adjustment disorder with mixed anxiety and depressed mood: Secondary | ICD-10-CM | POA: Diagnosis not present

## 2017-11-21 NOTE — Progress Notes (Signed)
[facility]  PATIENT: Amanda Collier DOB: [birth year]   REASON FOR VISIT: Follow-up for headaches HISTORY FROM: Patient    HISTORY OF PRESENT ILLNESS:UPDATE 1/10/2019CM Amanda Collier, 72 year old female returns for follow-up with history of headaches that began in July 2017.  Her headaches are low-grade dull aching in the back of her head.  She has a headache most days but her headaches are much less severe since being on nortriptyline.  MRI of the brain showed moderate level of small vessel disease likely related to her chronic hypertension.  She continues to perform her daily activities without difficulty.  She claims she is a couch potato.  She is going to [place] tomorrow for a week.  She needs refills on her medication.  She does not want an increased dose of Pamelor.  She had side effects to gabapentin.  She returns for reevaluation 03/09/17 KWMs. Amanda Collier is a 72 year old right-handed white female with a history of headaches that began in July 2017 and have been daily and persistent since that time. The headaches are very low-grade, dull aching pain in the back of the head. The patient denies a neck stiffness or shoulder discomfort. The patient has not had any nausea or vomiting with the headache. She was placed on gabapentin in low dose, but she could not tolerate the drug secondary to diarrhea. The patient has had MRI evaluation of the brain that showed a moderate level small vessel disease, likely related to chronic hypertension. A sedimentation rate was normal. The patient returns to the office today for an evaluation. The headaches do not keep her from doing anything. REVIEW OF SYSTEMS: Full 14 system review of systems performed and notable only for those listed, all others are neg:  Constitutional: neg  Cardiovascular: neg Ear/Nose/Throat: neg  Skin: neg Eyes: neg Respiratory: neg Gastroitestinal: Constipation Hematology/Lymphatic: neg  Endocrine:  neg Musculoskeletal:neg Allergy/Immunology: neg Neurological: Headache Psychiatric: neg Sleep : neg   ALLERGIES: Allergies  Allergen Reactions  . Codeine     nausea  . Lexapro [Escitalopram Oxalate]     GI upset  . Lisinopril     Cough   . Lotensin [Benazepril Hcl] Other (See Comments)    headaches headaches    HOME MEDICATIONS: Outpatient Medications Prior to Visit  Medication Sig Dispense Refill  . amLODipine (NORVASC) 5 MG tablet Take 5 mg by mouth daily.    Marland Kitchen aspirin 81 MG tablet Take 81 mg by mouth daily.    Marland Kitchen atorvastatin (LIPITOR) 10 MG tablet Take 10 mg by mouth daily.    . clonazePAM (KLONOPIN) 0.5 MG tablet Take 0.5 mg by mouth as needed for anxiety.    . Divalproex Sodium (DEPAKOTE PO) Take 1 Dose by mouth 2 (two) times daily. Can take up to three times daily     . furosemide (LASIX) 40 MG tablet Take 40 mg by mouth as needed. Reported on 12/17/2015    . hydrochlorothiazide (HYDRODIURIL) 25 MG tablet Take 25 mg by mouth daily.    Marland Kitchen losartan (COZAAR) 100 MG tablet Take 1 tablet by mouth daily.  1  . losartan (COZAAR) 50 MG tablet Take 1 tablet by mouth daily.    . Naproxen Sodium (ALEVE) 220 MG CAPS Take by mouth daily.    . nortriptyline (PAMELOR) 10 MG capsule 3 capsules by mouth at night 90 capsule 3  . potassium chloride (K-DUR) 10 MEQ tablet     . Vitamin D, Ergocalciferol, (DRISDOL) 50000 UNITS CAPS Take 50,000 Units by  mouth every 7 (seven) days.    Marland Kitchen zolpidem (AMBIEN) 5 MG tablet Take 10 mg by mouth at bedtime as needed for sleep.     No facility-administered medications prior to visit.     PAST MEDICAL HISTORY: Past Medical History:  Diagnosis Date  . Headache syndrome 12/18/2016  . High blood pressure   . Hypertension    followed by Dr Gillian Shields medications  . Leg swelling    Hx of    PAST SURGICAL HISTORY: Past Surgical History:  Procedure Laterality Date  . NO PAST SURGERIES      FAMILY HISTORY: Family History  Adopted: Yes   Problem Relation Age of Onset  . Heart attack Maternal Uncle        ? unknown if correct/patient adopted    SOCIAL HISTORY: Social History   Socioeconomic History  . Marital status: Married    Spouse name: Lounell Schumacher  . Number of children: 2  . Years of education: Teacher, English as a foreign language, almost masters  . Highest education level: Not on file  Social Needs  . Financial resource strain: Not on file  . Food insecurity - worry: Not on file  . Food insecurity - inability: Not on file  . Transportation needs - medical: Not on file  . Transportation needs - non-medical: Not on file  Occupational History  . Not on file  Tobacco Use  . Smoking status: Never Smoker  . Smokeless tobacco: Never Used  Substance and Sexual Activity  . Alcohol use: Yes    Alcohol/week: 4.2 oz    Types: 7 Standard drinks or equivalent per week  . Drug use: No  . Sexual activity: Yes    Partners: Male    Birth control/protection: Other-see comments, Post-menopausal    Comment: vasectomy  Other Topics Concern  . Not on file  Social History Narrative   Lives with husband   Caffeine use: Drinks mocha every couple days   Tea- daily     PHYSICAL EXAM  Vitals:   11/22/17 0728  BP: 128/82  Pulse: 89  Weight: 182 lb (82.6 kg)  Height: 5\' 4"  (1.626 m)   Body mass index is 31.24 kg/m.  Generalized: Well developed, in no acute distress  Head: normocephalic and atraumatic,. Oropharynx benign  Neck: Supple,  Musculoskeletal: No deformity   Neurological examination   Mentation: Alert oriented to time, place, history taking. Attention span and concentration appropriate. Recent and remote memory intact.  Follows all commands speech and language fluent.   Cranial nerve II-XII: .Pupils were equal round reactive to light extraocular movements were full, visual field were full on confrontational test. Facial sensation and strength were normal. hearing was intact to finger rubbing bilaterally. Uvula tongue  midline. head turning and shoulder shrug were normal and symmetric.Tongue protrusion into cheek strength was normal. Motor: normal bulk and tone, full strength in the BUE, BLE, fine finger movements normal, no pronator drift.  Sensory: normal and symmetric to light touch,  Coordination: finger-nose-finger, heel-to-shin bilaterally, no dysmetria Reflexes: Brachioradialis 2/2, biceps 2/2, triceps 2/2, patellar 2/2, Achilles 2/2, plantar responses were flexor bilaterally. Gait and Station: Rising up from seated position without assistance, normal stance,  moderate stride, good arm swing, smooth turning, able to perform tiptoe, and heel walking without difficulty. Tandem gait is unsteady  DIAGNOSTIC DATA (LABS, IMAGING, TESTING) - I reviewed patient records, labs, notes, testing and imaging myself where available.   ASSESSMENT AND PLAN  72 y.o. year old female  has a past  medical history of Headache syndrome (12/18/2016), High blood pressure, Hypertension, and Leg swelling. here to follow-up for chronic daily headache.  Patient has failed gabapentin in the past   PLAN: Continue nortriptyline at current dose will refill Given information on migraine triggers Call for worsening of headaches Recommend some type of daily exercise for overall health and well-being Follow-up yearly and as needed  Dennie Bible, [facility], [facility], APRN  [facility] [address] [place], [place] [address] [phone]

## 2017-11-22 ENCOUNTER — Ambulatory Visit (INDEPENDENT_AMBULATORY_CARE_PROVIDER_SITE_OTHER): Payer: Medicare Other | Admitting: Nurse Practitioner

## 2017-11-22 ENCOUNTER — Encounter: Payer: Self-pay | Admitting: Nurse Practitioner

## 2017-11-22 VITALS — BP 128/82 | HR 89 | Ht 64.0 in | Wt 182.0 lb

## 2017-11-22 DIAGNOSIS — G4489 Other headache syndrome: Secondary | ICD-10-CM

## 2017-11-22 MED ORDER — NORTRIPTYLINE HCL 10 MG PO CAPS: ORAL_CAPSULE | ORAL | 11 refills | Status: DC

## 2017-11-22 NOTE — Patient Instructions (Signed)
Continue nortriptyline at current dose Given information on migraine triggers Recommend exercise especially yoga Follow-up yearly and as needed

## 2017-11-22 NOTE — Progress Notes (Signed)
I have read the note, and I agree with the clinical assessment and plan.  Hulbert Branscome K Azile Minardi   

## 2018-02-22 DIAGNOSIS — M25571 Pain in right ankle and joints of right foot: Secondary | ICD-10-CM | POA: Diagnosis not present

## 2018-02-22 DIAGNOSIS — M7918 Myalgia, other site: Secondary | ICD-10-CM | POA: Diagnosis not present

## 2018-03-15 DIAGNOSIS — Z1231 Encounter for screening mammogram for malignant neoplasm of breast: Secondary | ICD-10-CM | POA: Diagnosis not present

## 2018-04-23 ENCOUNTER — Encounter: Payer: Self-pay | Admitting: Obstetrics & Gynecology

## 2018-06-27 DIAGNOSIS — D485 Neoplasm of uncertain behavior of skin: Secondary | ICD-10-CM | POA: Diagnosis not present

## 2018-06-27 DIAGNOSIS — L570 Actinic keratosis: Secondary | ICD-10-CM | POA: Diagnosis not present

## 2018-06-27 DIAGNOSIS — C44612 Basal cell carcinoma of skin of right upper limb, including shoulder: Secondary | ICD-10-CM | POA: Diagnosis not present

## 2018-07-24 DIAGNOSIS — I10 Essential (primary) hypertension: Secondary | ICD-10-CM | POA: Diagnosis not present

## 2018-07-24 DIAGNOSIS — E1169 Type 2 diabetes mellitus with other specified complication: Secondary | ICD-10-CM | POA: Diagnosis not present

## 2018-07-24 DIAGNOSIS — E7800 Pure hypercholesterolemia, unspecified: Secondary | ICD-10-CM | POA: Diagnosis not present

## 2018-07-24 DIAGNOSIS — E559 Vitamin D deficiency, unspecified: Secondary | ICD-10-CM | POA: Diagnosis not present

## 2018-09-16 DIAGNOSIS — Z23 Encounter for immunization: Secondary | ICD-10-CM | POA: Diagnosis not present

## 2018-09-24 DIAGNOSIS — C44612 Basal cell carcinoma of skin of right upper limb, including shoulder: Secondary | ICD-10-CM | POA: Diagnosis not present

## 2018-10-25 ENCOUNTER — Other Ambulatory Visit (HOSPITAL_COMMUNITY)
Admission: RE | Admit: 2018-10-25 | Discharge: 2018-10-25 | Disposition: A | Discharge status: Home / Self Care | Payer: Medicare Other | Source: Ambulatory Visit | Attending: Obstetrics & Gynecology | Admitting: Obstetrics & Gynecology

## 2018-10-25 ENCOUNTER — Ambulatory Visit (INDEPENDENT_AMBULATORY_CARE_PROVIDER_SITE_OTHER): Payer: Medicare Other | Admitting: Obstetrics & Gynecology

## 2018-10-25 ENCOUNTER — Encounter: Payer: Self-pay | Admitting: Obstetrics & Gynecology

## 2018-10-25 VITALS — BP 100/60 | HR 88 | Resp 18 | Ht 62.5 in | Wt 179.6 lb

## 2018-10-25 DIAGNOSIS — Z01419 Encounter for gynecological examination (general) (routine) without abnormal findings: Secondary | ICD-10-CM

## 2018-10-25 DIAGNOSIS — Z124 Encounter for screening for malignant neoplasm of cervix: Secondary | ICD-10-CM | POA: Diagnosis not present

## 2018-10-25 NOTE — Progress Notes (Signed)
72 y.o. G2P2 Married White or Caucasian female here for annual exam.  Denies vaginal bleeding.  Did have a flu shot in November.  Saw Dr. Inda Merlin this fall.  Blood work was good.  Having stressors right now with a friend in [employer].    Having stressors from her son due to his depression.  Seeing Dr. Toy Care.  She is now on Depakote.    Patient's last menstrual period was 11/14/2003.          Sexually active: Yes.    The current method of family planning is post menopausal status.    Exercising: No.   Smoker:  no  Health Maintenance: Pap:  12/17/15 Neg. HR HPV:neg   02/18/13 Neg  History of abnormal Pap:  no MMG:  03/15/18 BIRADS2:Benign Colonoscopy:  03/19/13 polyp. F/u 5 years.  Is going after Christmas.   BMD:   04/10/13 normal.  Will plan this year.   TDaP:  PCP Pneumonia vaccine(s):  PCP Shingrix:   PCP Hep C testing: Has donated blood in the last 8 years Screening Labs: PCP   reports that she has never smoked. She has never used smokeless tobacco. She reports current alcohol use of about 7.0 standard drinks of alcohol per week. She reports that she does not use drugs.  Past Medical History:  Diagnosis Date  . Headache syndrome 12/18/2016  . High blood pressure   . Hypertension    followed by Dr Gillian Shields medications  . Leg swelling    Hx of    Past Surgical History:  Procedure Laterality Date  . NO PAST SURGERIES      Current Outpatient Medications  Medication Sig Dispense Refill  . amLODipine (NORVASC) 5 MG tablet Take 5 mg by mouth daily.    Marland Kitchen aspirin 81 MG tablet Take 81 mg by mouth every other day.     Marland Kitchen atorvastatin (LIPITOR) 10 MG tablet Take 10 mg by mouth daily.    . clonazePAM (KLONOPIN) 0.5 MG tablet Take 0.5 mg by mouth as needed for anxiety.    . Divalproex Sodium (DEPAKOTE PO) Take 250 mg by mouth 3 (three) times daily. Can take up to three times daily     . furosemide (LASIX) 40 MG tablet Take 40 mg by mouth as needed. Reported on 12/17/2015    .  hydrochlorothiazide (HYDRODIURIL) 25 MG tablet Take 25 mg by mouth daily.    Marland Kitchen losartan (COZAAR) 100 MG tablet Take 1 tablet by mouth daily.  1  . metFORMIN (GLUCOPHAGE) 500 MG tablet Take 500 mg by mouth daily with breakfast.    . Naproxen Sodium (ALEVE) 220 MG CAPS Take by mouth daily.    . nortriptyline (PAMELOR) 10 MG capsule 3 capsules by mouth at night 90 capsule 11  . potassium chloride (K-DUR) 10 MEQ tablet     . Vitamin D, Ergocalciferol, (DRISDOL) 50000 UNITS CAPS Take 50,000 Units by mouth every 7 (seven) days.    Marland Kitchen zolpidem (AMBIEN) 5 MG tablet Take 10 mg by mouth at bedtime as needed for sleep.     No current facility-administered medications for this visit.     Family History  Adopted: Yes  Problem Relation Age of Onset  . Heart attack Maternal Uncle        ? unknown if correct/patient adopted    Review of Systems  All other systems reviewed and are negative.   Exam:   BP 100/60 (BP Location: Left Arm, Patient Position: Sitting, Cuff Size:  Large)   Pulse 88   Resp 18   Ht 5' 2.5" (1.588 m)   Wt 179 lb 9.6 oz (81.5 kg)   LMP 11/14/2003   BMI 32.33 kg/m   Height:   Height: 5' 2.5" (158.8 cm)  Ht Readings from Last 3 Encounters:  10/25/18 5' 2.5" (1.588 m)  11/22/17 5\' 4"  (1.626 m)  08/20/17 5' 3.5" (1.613 m)    General appearance: alert, cooperative and appears stated age Head: Normocephalic, without obvious abnormality, atraumatic Neck: no adenopathy, supple, symmetrical, trachea midline and thyroid normal to inspection and palpation Lungs: clear to auscultation bilaterally Breasts: normal appearance, no masses or tenderness Heart: regular rate and rhythm Abdomen: soft, non-tender; bowel sounds normal; no masses,  no organomegaly Extremities: extremities normal, atraumatic, no cyanosis or edema Skin: Skin color, texture, turgor normal. No rashes or lesions Lymph nodes: Cervical, supraclavicular, and axillary nodes normal. No abnormal inguinal nodes  palpated Neurologic: Grossly normal   Pelvic: External genitalia:  no lesions              Urethra:  normal appearing urethra with no masses, tenderness or lesions              Bartholins and Skenes: normal                 Vagina: normal appearing vagina with normal color and discharge, no lesions              Cervix: no lesions              Pap taken: Yes.   Bimanual Exam:  Uterus:  normal size, contour, position, consistency, mobility, non-tender              Adnexa: normal adnexa and no mass, fullness, tenderness               Rectovaginal: Confirms               Anus:  normal sphincter tone, no lesions  Chaperone was present for exam.  A:  Well Woman with normal exam PMP, no HRT Type 2 DM Hypertension Headaches, improved, followed by Dr. Jannifer Franklin Social stressors  P:   Mammogram guidelines reviewed.  Doing yearly.   pap smear obtained today Lab work and vaccines are UTD BMD ordered.  She will call and schedule this. return annually or prn

## 2018-10-29 LAB — CYTOLOGY - PAP: Diagnosis: NEGATIVE

## 2018-12-03 DIAGNOSIS — F310 Bipolar disorder, current episode hypomanic: Secondary | ICD-10-CM | POA: Diagnosis not present

## 2018-12-03 DIAGNOSIS — E1169 Type 2 diabetes mellitus with other specified complication: Secondary | ICD-10-CM | POA: Diagnosis not present

## 2018-12-03 DIAGNOSIS — E7800 Pure hypercholesterolemia, unspecified: Secondary | ICD-10-CM | POA: Diagnosis not present

## 2018-12-03 DIAGNOSIS — I10 Essential (primary) hypertension: Secondary | ICD-10-CM | POA: Diagnosis not present

## 2018-12-03 DIAGNOSIS — Z1211 Encounter for screening for malignant neoplasm of colon: Secondary | ICD-10-CM | POA: Diagnosis not present

## 2019-01-22 ENCOUNTER — Encounter: Payer: Self-pay | Admitting: Neurology

## 2019-01-22 ENCOUNTER — Telehealth: Payer: Self-pay | Admitting: Neurology

## 2019-01-22 ENCOUNTER — Ambulatory Visit (INDEPENDENT_AMBULATORY_CARE_PROVIDER_SITE_OTHER): Payer: Medicare Other | Admitting: Neurology

## 2019-01-22 ENCOUNTER — Other Ambulatory Visit: Payer: Self-pay

## 2019-01-22 VITALS — BP 144/81 | HR 76 | Ht 62.5 in | Wt 181.4 lb

## 2019-01-22 DIAGNOSIS — R519 Headache, unspecified: Secondary | ICD-10-CM

## 2019-01-22 DIAGNOSIS — E785 Hyperlipidemia, unspecified: Secondary | ICD-10-CM | POA: Diagnosis not present

## 2019-01-22 DIAGNOSIS — I10 Essential (primary) hypertension: Secondary | ICD-10-CM | POA: Diagnosis not present

## 2019-01-22 DIAGNOSIS — R51 Headache: Secondary | ICD-10-CM | POA: Diagnosis not present

## 2019-01-22 DIAGNOSIS — E119 Type 2 diabetes mellitus without complications: Secondary | ICD-10-CM | POA: Diagnosis not present

## 2019-01-22 DIAGNOSIS — Z1211 Encounter for screening for malignant neoplasm of colon: Secondary | ICD-10-CM | POA: Diagnosis not present

## 2019-01-22 DIAGNOSIS — G8929 Other chronic pain: Secondary | ICD-10-CM

## 2019-01-22 MED ORDER — TOPIRAMATE 25 MG PO TABS: ORAL_TABLET | ORAL | 3 refills | Status: DC

## 2019-01-22 MED ORDER — NORTRIPTYLINE HCL 10 MG PO CAPS: ORAL_CAPSULE | ORAL | 0 refills | Status: DC

## 2019-01-22 NOTE — Progress Notes (Signed)
I have read the note, and I agree with the clinical assessment and plan.  Brittan Butterbaugh K Sowmya Partridge   

## 2019-01-22 NOTE — Patient Instructions (Signed)
Start taking Topamax 25 mg at bedtime x 1 week, then increase to 50 mg at bedtime  Keep taking nortriptyline 20 mg at bedtime x 1 week, then take 10 mg at bedtime x 1 week, then you can stop the medication   Topiramate tablets What is this medicine? TOPIRAMATE (toe PYRE a mate) is used to treat seizures in adults or children with epilepsy. It is also used for the prevention of migraine headaches. This medicine may be used for other purposes; ask your health care provider or pharmacist if you have questions. COMMON BRAND NAME(S): Topamax, Topiragen What should I tell my health care provider before I take this medicine? They need to know if you have any of these conditions: -bleeding disorders -cirrhosis of the liver or liver disease -diarrhea -glaucoma -kidney stones or kidney disease -low blood counts, like low white cell, platelet, or red cell counts -lung disease like asthma, obstructive pulmonary disease, emphysema -metabolic acidosis -on a ketogenic diet -schedule for surgery or a procedure -suicidal thoughts, plans, or attempt; a previous suicide attempt by you or a family member -an unusual or allergic reaction to topiramate, other medicines, foods, dyes, or preservatives -pregnant or trying to get pregnant -breast-feeding How should I use this medicine? Take this medicine by mouth with a glass of water. Follow the directions on the prescription label. Do not crush or chew. You may take this medicine with meals. Take your medicine at regular intervals. Do not take it more often than directed. Talk to your pediatrician regarding the use of this medicine in children. Special care may be needed. While this drug may be prescribed for children as young as 61 years of age for selected conditions, precautions do apply. Overdosage: If you think you have taken too much of this medicine contact a poison control center or emergency room at once. NOTE: This medicine is only for you. Do not share  this medicine with others. What if I miss a dose? If you miss a dose, take it as soon as you can. If your next dose is to be taken in less than 6 hours, then do not take the missed dose. Take the next dose at your regular time. Do not take double or extra doses. What may interact with this medicine? Do not take this medicine with any of the following medications: -probenecid This medicine may also interact with the following medications: -acetazolamide -alcohol -amitriptyline -aspirin and aspirin-like medicines -birth control pills -certain medicines for depression -certain medicines for seizures -certain medicines that treat or prevent blood clots like warfarin, enoxaparin, dalteparin, apixaban, dabigatran, and rivaroxaban -digoxin -hydrochlorothiazide -lithium -medicines for pain, sleep, or muscle relaxation -metformin -methazolamide -NSAIDS, medicines for pain and inflammation, like ibuprofen or naproxen -pioglitazone -risperidone This list may not describe all possible interactions. Give your health care provider a list of all the medicines, herbs, non-prescription drugs, or dietary supplements you use. Also tell them if you smoke, drink alcohol, or use illegal drugs. Some items may interact with your medicine. What should I watch for while using this medicine? Visit your doctor or health care professional for regular checks on your progress. Do not stop taking this medicine suddenly. This increases the risk of seizures if you are using this medicine to control epilepsy. Wear a medical identification bracelet or chain to say you have epilepsy or seizures, and carry a card that lists all your medicines. This medicine can decrease sweating and increase your body temperature. Watch for signs of deceased sweating or  fever, especially in children. Avoid extreme heat, hot baths, and saunas. Be careful about exercising, especially in hot weather. Contact your health care provider right away  if you notice a fever or decrease in sweating. You should drink plenty of fluids while taking this medicine. If you have had kidney stones in the past, this will help to reduce your chances of forming kidney stones. If you have stomach pain, with nausea or vomiting and yellowing of your eyes or skin, call your doctor immediately. You may get drowsy, dizzy, or have blurred vision. Do not drive, use machinery, or do anything that needs mental alertness until you know how this medicine affects you. To reduce dizziness, do not sit or stand up quickly, especially if you are an older patient. Alcohol can increase drowsiness and dizziness. Avoid alcoholic drinks. If you notice blurred vision, eye pain, or other eye problems, seek medical attention at once for an eye exam. The use of this medicine may increase the chance of suicidal thoughts or actions. Pay special attention to how you are responding while on this medicine. Any worsening of mood, or thoughts of suicide or dying should be reported to your health care professional right away. This medicine may increase the chance of developing metabolic acidosis. If left untreated, this can cause kidney stones, bone disease, or slowed growth in children. Symptoms include breathing fast, fatigue, loss of appetite, irregular heartbeat, or loss of consciousness. Call your doctor immediately if you experience any of these side effects. Also, tell your doctor about any surgery you plan on having while taking this medicine since this may increase your risk for metabolic acidosis. Birth control pills may not work properly while you are taking this medicine. Talk to your doctor about using an extra method of birth control. Women who become pregnant while using this medicine may enroll in the [place] Pregnancy Registry by calling [phone]. This registry collects information about the safety of antiepileptic drug use during pregnancy. What side  effects may I notice from receiving this medicine? Side effects that you should report to your doctor or health care professional as soon as possible: -allergic reactions like skin rash, itching or hives, swelling of the face, lips, or tongue -decreased sweating and/or rise in body temperature -depression -difficulty breathing, fast or irregular breathing patterns -difficulty speaking -difficulty walking or controlling muscle movements -hearing impairment -redness, blistering, peeling or loosening of the skin, including inside the mouth -tingling, pain or numbness in the hands or feet -unusual bleeding or bruising -unusually weak or tired -worsening of mood, thoughts or actions of suicide or dying Side effects that usually do not require medical attention (report to your doctor or health care professional if they continue or are bothersome): -altered taste -back pain, joint or muscle aches and pains -diarrhea, or constipation -headache -loss of appetite -nausea -stomach upset, indigestion -tremors This list may not describe all possible side effects. Call your doctor for medical advice about side effects. You may report side effects to FDA at 1-800-FDA-1088. Where should I keep my medicine? Keep out of the reach of children. Store at room temperature between 15 and 30 degrees C (59 and 86 degrees F) in a tightly closed container. Protect from moisture. Throw away any unused medicine after the expiration date. NOTE: This sheet is a summary. It may not cover all possible information. If you have questions about this medicine, talk to your doctor, pharmacist, or health care provider.  2019 Elsevier/Gold Standard (2013-11-03 23:17:57)

## 2019-01-22 NOTE — Progress Notes (Signed)
PATIENT: Amanda Collier DOB: [birth year]  REASON FOR VISIT: follow up HISTORY FROM: patient  HISTORY OF PRESENT ILLNESS: Today 01/22/19  Amanda Collier is a 73 year old female who presents for follow-up with history of headaches beginning in July 2017.  She is currently taking nortriptyline 20 mg at bedtime. In the past she has been unable to tolerate gabapentin due to diarrhea.  She reports she is having about 20 days/month with headache.  She describes her typical headache as throbbing into the top of her head.  She denies photophobia, phonophobia, nausea or vomiting.  She reports this does not interfere with her daily activities however it does make her feel irritable.  She reports she has had headaches so long it has just become her norm.  She describes the pain as 6/10.  She has not found nortriptyline to be very helpful for headache and she reports significant constipation with medication.  She denies any other new problems or concerns.  She presents today for follow-up unaccompanied.  HISTORY  1/10/2019CM Amanda Collier, 73 year old female returns for follow-up with history of headaches that began in July 2017.  Her headaches are low-grade dull aching in the back of her head.  She has a headache most days but her headaches are much less severe since being on nortriptyline.  MRI of the brain showed moderate level of small vessel disease likely related to her chronic hypertension.  She continues to perform her daily activities without difficulty.  She claims she is a couch potato.  She is going to [place] tomorrow for a week.  She needs refills on her medication.  She does not want an increased dose of Pamelor.  She had side effects to gabapentin.  She returns for reevaluation  REVIEW OF SYSTEMS: Out of a complete 14 system review of symptoms, the patient complains only of the following symptoms, and all other reviewed systems are negative.  Constipation, urgency  ALLERGIES: Allergies    Allergen Reactions  . Codeine     nausea  . Lexapro [Escitalopram Oxalate]     GI upset  . Lisinopril     Cough   . Lotensin [Benazepril Hcl] Other (See Comments)    headaches headaches    HOME MEDICATIONS: Outpatient Medications Prior to Visit  Medication Sig Dispense Refill  . amLODipine (NORVASC) 5 MG tablet Take 5 mg by mouth daily.    Marland Kitchen aspirin 81 MG tablet Take 81 mg by mouth every other day.     Marland Kitchen atorvastatin (LIPITOR) 10 MG tablet Take 10 mg by mouth daily.    . clonazePAM (KLONOPIN) 0.5 MG tablet Take 0.5 mg by mouth as needed for anxiety.    . Divalproex Sodium (DEPAKOTE PO) Take 250 mg by mouth 3 (three) times daily. Can take up to three times daily     . furosemide (LASIX) 40 MG tablet Take 40 mg by mouth as needed. Reported on 12/17/2015    . hydrochlorothiazide (HYDRODIURIL) 25 MG tablet Take 25 mg by mouth daily.    Marland Kitchen losartan (COZAAR) 100 MG tablet Take 1 tablet by mouth daily.  1  . metFORMIN (GLUCOPHAGE) 500 MG tablet Take 500 mg by mouth daily with breakfast.    . Naproxen Sodium (ALEVE) 220 MG CAPS Take by mouth daily.    . nortriptyline (PAMELOR) 10 MG capsule 3 capsules by mouth at night 90 capsule 11  . potassium chloride (K-DUR) 10 MEQ tablet     . Vitamin D, Ergocalciferol, (DRISDOL) 50000 UNITS  CAPS Take 50,000 Units by mouth every 7 (seven) days.    Marland Kitchen zolpidem (AMBIEN) 5 MG tablet Take 10 mg by mouth at bedtime as needed for sleep.     No facility-administered medications prior to visit.     PAST MEDICAL HISTORY: Past Medical History:  Diagnosis Date  . Headache syndrome 12/18/2016  . High blood pressure   . Hypertension    followed by Dr Gillian Shields medications  . Leg swelling    Hx of    PAST SURGICAL HISTORY: Past Surgical History:  Procedure Laterality Date  . NO PAST SURGERIES      FAMILY HISTORY: Family History  Adopted: Yes  Problem Relation Age of Onset  . Heart attack Maternal Uncle        ? unknown if correct/patient  adopted    SOCIAL HISTORY: Social History   Socioeconomic History  . Marital status: Married    Spouse name: Latrena Benegas  . Number of children: 2  . Years of education: Teacher, English as a foreign language, almost masters  . Highest education level: Not on file  Occupational History  . Not on file  Social Needs  . Financial resource strain: Not on file  . Food insecurity:    Worry: Not on file    Inability: Not on file  . Transportation needs:    Medical: Not on file    Non-medical: Not on file  Tobacco Use  . Smoking status: Never Smoker  . Smokeless tobacco: Never Used  Substance and Sexual Activity  . Alcohol use: Yes    Alcohol/week: 7.0 standard drinks    Types: 7 Standard drinks or equivalent per week  . Drug use: No  . Sexual activity: Yes    Partners: Male    Birth control/protection: Other-see comments, Post-menopausal    Comment: vasectomy  Lifestyle  . Physical activity:    Days per week: Not on file    Minutes per session: Not on file  . Stress: Not on file  Relationships  . Social connections:    Talks on phone: Not on file    Gets together: Not on file    Attends religious service: Not on file    Active member of club or organization: Not on file    Attends meetings of clubs or organizations: Not on file    Relationship status: Not on file  . Intimate partner violence:    Fear of current or ex partner: Not on file    Emotionally abused: Not on file    Physically abused: Not on file    Forced sexual activity: Not on file  Other Topics Concern  . Not on file  Social History Narrative   Lives with husband   Caffeine use: Drinks mocha every couple days   Tea- daily      PHYSICAL EXAM  There were no vitals filed for this visit. There is no height or weight on file to calculate BMI.  Generalized: Well developed, in no acute distress   Neurological examination  Mentation: Alert oriented to time, place, history taking. Follows all commands speech and language  fluent Cranial nerve II-XII: Pupils were equal round reactive to light. Extraocular movements were full, visual field were full on confrontational test. Facial sensation and strength were normal. Uvula tongue midline. Head turning and shoulder shrug  were normal and symmetric. Motor: The motor testing reveals 5 over 5 strength of all 4 extremities. Good symmetric motor tone is noted throughout.  Sensory: Sensory testing is  intact to soft touch on all 4 extremities. No evidence of extinction is noted.  Coordination: Cerebellar testing reveals good finger-nose-finger and heel-to-shin bilaterally.  Gait and station: Gait is normal. Tandem gait is normal. Romberg is negative. No drift is seen.  Reflexes: Deep tendon reflexes are symmetric and normal bilaterally.   DIAGNOSTIC DATA (LABS, IMAGING, TESTING) - I reviewed patient records, labs, notes, testing and imaging myself where available.  No results found for: WBC, HGB, HCT, MCV, PLT No results found for: NA, K, CL, CO2, GLUCOSE, BUN, CREATININE, CALCIUM, PROT, ALBUMIN, AST, ALT, ALKPHOS, BILITOT, GFRNONAA, GFRAA No results found for: CHOL, HDL, LDLCALC, LDLDIRECT, TRIG, CHOLHDL No results found for: HGBA1C No results found for: VITAMINB12 No results found for: TSH    ASSESSMENT AND PLAN 73 y.o. year old female  has a past medical history of Headache syndrome (12/18/2016), High blood pressure, Hypertension, and Leg swelling. here with:  1.  Headache  She is currently taking nortriptyline 20 mg at bedtime.  She reports significant constipation with this medication and not much benefit relief of her headache.  She is having about 20 days/month of headache.  She reports these are her typical headaches.  She will start taking Topamax 25 mg at bedtime for 1 week then increasing to 50 mg at bedtime.  She was slowly come off the nortriptyline. We discussed side effects of medication.  Reviewed her prior MRI done in February 2018 with Dr. Jannifer Franklin that  showed moderate small vessel disease.  We will repeat the MRI, an order will be placed.  She will call if we need to increase the dose.  She will follow-up in 4 months or sooner if needed I advised that if her symptoms worsen or if she develops any new symptoms she should let us know.   I spent 15 minutes with the patient. 50% of this time was spent discussing her plan of care   Evangeline Dakin, DNP 01/22/2019, 7:19 AM [facility] [address], [place], [place] [address] [phone]

## 2019-01-22 NOTE — Telephone Encounter (Signed)
Medicare/aarp order sent to GI. No auth they will reach out to the pt to schedule.  °

## 2019-01-29 DIAGNOSIS — H524 Presbyopia: Secondary | ICD-10-CM | POA: Diagnosis not present

## 2019-01-29 DIAGNOSIS — E119 Type 2 diabetes mellitus without complications: Secondary | ICD-10-CM | POA: Diagnosis not present

## 2019-01-29 DIAGNOSIS — H52223 Regular astigmatism, bilateral: Secondary | ICD-10-CM | POA: Diagnosis not present

## 2019-01-29 DIAGNOSIS — H5203 Hypermetropia, bilateral: Secondary | ICD-10-CM | POA: Diagnosis not present

## 2019-01-30 ENCOUNTER — Ambulatory Visit
Admission: RE | Admit: 2019-01-30 | Discharge: 2019-01-30 | Disposition: A | Discharge status: Home / Self Care | Payer: Medicare Other | Source: Ambulatory Visit | Attending: Neurology | Admitting: Neurology

## 2019-01-30 ENCOUNTER — Other Ambulatory Visit: Payer: Self-pay

## 2019-01-30 DIAGNOSIS — R519 Headache, unspecified: Secondary | ICD-10-CM

## 2019-01-30 DIAGNOSIS — R51 Headache: Principal | ICD-10-CM

## 2019-01-30 DIAGNOSIS — G8929 Other chronic pain: Secondary | ICD-10-CM

## 2019-02-03 ENCOUNTER — Telehealth: Payer: Self-pay | Admitting: Neurology

## 2019-02-03 NOTE — Telephone Encounter (Signed)
I called the patient to review her MRI of the brain done on January 30, 2019.   IMPRESSION: This MRI of the brain without contrast shows the following: 1.    Mild generalized cortical atrophy that is slightly progressed compared to the 2018 MRI. 2.    Multiple T2/flair hyperintense foci in the hemispheres most consistent with chronic microvascular ischemic changes.  There is no definite progression when compared to the previous MRI. 3.    Cystic structure posterior to the left masseter.  It has a benign appearance but was not present on the previous MRI.  Consider a follow-up evaluation to assess stability.  Dr. Jannifer Franklin was able to review her previous MRI of the brain in 2018 and showed very little change. She will continue taking 81 mg aspirin daily. She verbalized understanding and appreciated the call.

## 2019-03-28 ENCOUNTER — Encounter: Payer: Self-pay | Admitting: Podiatry

## 2019-03-28 ENCOUNTER — Ambulatory Visit (INDEPENDENT_AMBULATORY_CARE_PROVIDER_SITE_OTHER): Payer: Medicare Other | Admitting: Podiatry

## 2019-03-28 ENCOUNTER — Other Ambulatory Visit: Payer: Self-pay

## 2019-03-28 VITALS — Temp 97.7°F

## 2019-03-28 DIAGNOSIS — E119 Type 2 diabetes mellitus without complications: Secondary | ICD-10-CM | POA: Diagnosis not present

## 2019-03-28 DIAGNOSIS — M79674 Pain in right toe(s): Secondary | ICD-10-CM | POA: Diagnosis not present

## 2019-03-28 DIAGNOSIS — M79675 Pain in left toe(s): Secondary | ICD-10-CM

## 2019-03-28 DIAGNOSIS — B351 Tinea unguium: Secondary | ICD-10-CM

## 2019-03-28 NOTE — Progress Notes (Signed)
This patient presents to the office with chief complaint of long thick nails and diabetic feet.  This patient  says there  is  no pain and discomfort in her  feet. 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 This patient says there are long thick painful nails.  These nails are painful walking and wearing shoes.  Patient has no history of infection or drainage from both feet.  Patient is unable to  self treat his own nails . This patient presents  to the office today for treatment of the  long nails and a foot evaluation due to history of  diabetes.  General Appearance  Alert, conversant and in no acute stress.  Vascular  Dorsalis pedis and posterior tibial  pulses are palpable  bilaterally.  Capillary return is within normal limits  bilaterally. Temperature is within normal limits  bilaterally.  Neurologic  Senn-Weinstein monofilament wire test within normal limits  bilaterally. Muscle power within normal limits bilaterally.  Nails Thick disfigured discolored nails with subungual debris  from hallux to fifth toes bilaterally. No evidence of bacterial infection or drainage bilaterally. Her hallux nails are unattached distally.  Orthopedic  No limitations of motion of motion feet .  No crepitus or effusions noted.  No bony pathology or digital deformities noted. HAV  B/L  Skin  normotropic skin with no porokeratosis noted bilaterally.  No signs of infections or ulcers noted.     Onychomycosis  Diabetes with no foot complications  IE  Debride nails x 10.  A diabetic foot exam was performed and there is no evidence of any vascular or neurologic pathology.   RTC 3 months.  Patient will make an appointment in future for her ankle arthritis.   Gardiner Barefoot DPM

## 2019-04-25 ENCOUNTER — Ambulatory Visit: Payer: Medicare Other | Admitting: Neurology

## 2019-06-26 DIAGNOSIS — L570 Actinic keratosis: Secondary | ICD-10-CM | POA: Diagnosis not present

## 2019-06-26 DIAGNOSIS — D225 Melanocytic nevi of trunk: Secondary | ICD-10-CM | POA: Diagnosis not present

## 2019-06-26 DIAGNOSIS — D1801 Hemangioma of skin and subcutaneous tissue: Secondary | ICD-10-CM | POA: Diagnosis not present

## 2019-06-26 DIAGNOSIS — Z85828 Personal history of other malignant neoplasm of skin: Secondary | ICD-10-CM | POA: Diagnosis not present

## 2019-07-30 DIAGNOSIS — E1169 Type 2 diabetes mellitus with other specified complication: Secondary | ICD-10-CM | POA: Diagnosis not present

## 2019-07-30 DIAGNOSIS — I10 Essential (primary) hypertension: Secondary | ICD-10-CM | POA: Diagnosis not present

## 2019-07-30 DIAGNOSIS — E7800 Pure hypercholesterolemia, unspecified: Secondary | ICD-10-CM | POA: Diagnosis not present

## 2019-07-30 DIAGNOSIS — Z23 Encounter for immunization: Secondary | ICD-10-CM | POA: Diagnosis not present

## 2019-07-30 DIAGNOSIS — E559 Vitamin D deficiency, unspecified: Secondary | ICD-10-CM | POA: Diagnosis not present

## 2019-08-01 DIAGNOSIS — F310 Bipolar disorder, current episode hypomanic: Secondary | ICD-10-CM | POA: Diagnosis not present

## 2019-08-01 DIAGNOSIS — Z1211 Encounter for screening for malignant neoplasm of colon: Secondary | ICD-10-CM | POA: Diagnosis not present

## 2019-08-01 DIAGNOSIS — Z0000 Encounter for general adult medical examination without abnormal findings: Secondary | ICD-10-CM | POA: Diagnosis not present

## 2019-08-01 DIAGNOSIS — I10 Essential (primary) hypertension: Secondary | ICD-10-CM | POA: Diagnosis not present

## 2019-08-01 DIAGNOSIS — E1169 Type 2 diabetes mellitus with other specified complication: Secondary | ICD-10-CM | POA: Diagnosis not present

## 2019-08-01 DIAGNOSIS — E7800 Pure hypercholesterolemia, unspecified: Secondary | ICD-10-CM | POA: Diagnosis not present

## 2019-08-13 ENCOUNTER — Encounter: Payer: Self-pay | Admitting: Obstetrics & Gynecology

## 2019-08-13 DIAGNOSIS — E2839 Other primary ovarian failure: Secondary | ICD-10-CM | POA: Diagnosis not present

## 2019-08-13 DIAGNOSIS — R29890 Loss of height: Secondary | ICD-10-CM | POA: Diagnosis not present

## 2019-08-13 DIAGNOSIS — E119 Type 2 diabetes mellitus without complications: Secondary | ICD-10-CM | POA: Diagnosis not present

## 2019-08-13 DIAGNOSIS — Z780 Asymptomatic menopausal state: Secondary | ICD-10-CM | POA: Diagnosis not present

## 2019-08-13 DIAGNOSIS — Z1231 Encounter for screening mammogram for malignant neoplasm of breast: Secondary | ICD-10-CM | POA: Diagnosis not present

## 2019-08-13 DIAGNOSIS — E559 Vitamin D deficiency, unspecified: Secondary | ICD-10-CM | POA: Diagnosis not present

## 2019-08-14 DIAGNOSIS — I10 Essential (primary) hypertension: Secondary | ICD-10-CM | POA: Diagnosis not present

## 2019-09-01 ENCOUNTER — Telehealth: Payer: Self-pay | Admitting: *Deleted

## 2019-09-01 NOTE — Telephone Encounter (Signed)
Left voicemail with normal BMD results.  Report to scan.

## 2019-09-16 DIAGNOSIS — Z1159 Encounter for screening for other viral diseases: Secondary | ICD-10-CM | POA: Diagnosis not present

## 2019-09-19 DIAGNOSIS — Z86010 Personal history of colonic polyps: Secondary | ICD-10-CM | POA: Diagnosis not present

## 2019-09-19 DIAGNOSIS — K635 Polyp of colon: Secondary | ICD-10-CM | POA: Diagnosis not present

## 2019-09-19 DIAGNOSIS — K5730 Diverticulosis of large intestine without perforation or abscess without bleeding: Secondary | ICD-10-CM | POA: Diagnosis not present

## 2019-09-19 DIAGNOSIS — K640 First degree hemorrhoids: Secondary | ICD-10-CM | POA: Diagnosis not present

## 2019-09-23 DIAGNOSIS — K635 Polyp of colon: Secondary | ICD-10-CM | POA: Diagnosis not present

## 2019-10-10 ENCOUNTER — Other Ambulatory Visit: Payer: Self-pay | Admitting: Neurology

## 2019-10-15 ENCOUNTER — Other Ambulatory Visit: Payer: Self-pay | Admitting: Neurology

## 2019-10-31 ENCOUNTER — Other Ambulatory Visit: Payer: Self-pay | Admitting: Neurology

## 2019-12-14 ENCOUNTER — Ambulatory Visit: Payer: Medicare Other

## 2019-12-19 ENCOUNTER — Ambulatory Visit: Payer: Medicare Other | Attending: Internal Medicine

## 2019-12-19 DIAGNOSIS — Z23 Encounter for immunization: Secondary | ICD-10-CM | POA: Insufficient documentation

## 2019-12-19 NOTE — Progress Notes (Signed)
   Covid-19 Vaccination Clinic  Name:  Amanda Collier    MRN: [id number] DOB: 12/03/1945  12/19/2019  [name] was observed post Covid-19 immunization for 15 minutes without incidence. She was provided with Vaccine Information Sheet and instruction to access the V-Safe system.   [name] was instructed to call 911 with any severe reactions post vaccine: Marland Kitchen Difficulty breathing  . Swelling of your face and throat  . A fast heartbeat  . A bad rash all over your body  . Dizziness and weakness    Immunizations Administered    Name Date Dose VIS Date Route   Pfizer COVID-19 Vaccine 12/19/2019 11:21 AM 0.3 mL 10/24/2019 Intramuscular   Manufacturer: [place]   Lot: [id number]   [place]: [id number]

## 2019-12-25 ENCOUNTER — Ambulatory Visit: Payer: Medicare Other

## 2020-01-13 ENCOUNTER — Ambulatory Visit: Payer: Medicare Other | Attending: Internal Medicine

## 2020-01-13 DIAGNOSIS — Z23 Encounter for immunization: Secondary | ICD-10-CM

## 2020-01-13 NOTE — Progress Notes (Signed)
   Covid-19 Vaccination Clinic  Name:  Amanda Collier    MRN: [id number] DOB: 1946/06/07  01/13/2020  [name] was observed post Covid-19 immunization for 15 minutes without incident. She was provided with Vaccine Information Sheet and instruction to access the V-Safe system.   [name] was instructed to call 911 with any severe reactions post vaccine: Marland Kitchen Difficulty breathing  . Swelling of face and throat  . A fast heartbeat  . A bad rash all over body  . Dizziness and weakness   Immunizations Administered    Name Date Dose VIS Date Route   Pfizer COVID-19 Vaccine 01/13/2020 10:33 AM 0.3 mL 10/24/2019 Intramuscular   Manufacturer: [place]   Lot: [id number]   [place]: [id number]

## 2020-01-19 DIAGNOSIS — H524 Presbyopia: Secondary | ICD-10-CM | POA: Diagnosis not present

## 2020-01-19 DIAGNOSIS — H2513 Age-related nuclear cataract, bilateral: Secondary | ICD-10-CM | POA: Diagnosis not present

## 2020-01-19 DIAGNOSIS — H52223 Regular astigmatism, bilateral: Secondary | ICD-10-CM | POA: Diagnosis not present

## 2020-01-19 DIAGNOSIS — E119 Type 2 diabetes mellitus without complications: Secondary | ICD-10-CM | POA: Diagnosis not present

## 2020-01-19 DIAGNOSIS — H5203 Hypermetropia, bilateral: Secondary | ICD-10-CM | POA: Diagnosis not present

## 2020-02-12 ENCOUNTER — Other Ambulatory Visit: Payer: Self-pay | Admitting: Neurology

## 2020-02-23 ENCOUNTER — Telehealth: Payer: Self-pay | Admitting: Neurology

## 2020-02-23 NOTE — Telephone Encounter (Signed)
1) Medication(s) Requested (by name): topiramate (TOPAMAX) 25 MG tablet   2) Pharmacy of Choice: [facility] [place]  [address], [place] [address]  3) Special Requests:

## 2020-02-24 MED ORDER — TOPIRAMATE 25 MG PO TABS: ORAL_TABLET | ORAL | 0 refills | Status: DC

## 2020-02-26 ENCOUNTER — Other Ambulatory Visit: Payer: Self-pay | Admitting: Neurology

## 2020-03-02 ENCOUNTER — Ambulatory Visit: Payer: Medicare Other | Admitting: Obstetrics & Gynecology

## 2020-03-15 NOTE — Progress Notes (Signed)
PATIENT: Amanda Collier DOB: 1946/07/12  REASON FOR VISIT: follow up HISTORY FROM: patient  HISTORY OF PRESENT ILLNESS: Today 03/16/20  Ms. Pardy is a 74 year old female with history of headaches beginning in July 2017.  She has been weaned off nortriptyline due to lack of benefit and constipation.  She is on Topamax now 50 mg at bedtime.  MRI of the brain was repeated to compare to previous in 2018, showed very little change, still moderate small vessel disease.  She didn't continue the aspirin 81 mg daily for unknown reason.  Her headaches are currently under excellent control, she no longer has frequent headaches, was previously reporting 20 days a month headache, now has rare headache.  Seems to be tolerating Topamax well.  She does mention, she does not know her family history, as she was adopted.  She is overall doing well, is grieving the loss of a dear friend.  She hopes to get back to traveling.  Is seeing her PCP next week.  She presents today for evaluation unaccompanied.  HISTORY 01/22/2019 SS: Ms. Lands is a 74 year old female who presents for follow-up with history of headaches beginning in July 2017.  She is currently taking nortriptyline 20 mg at bedtime. In the past she has been unable to tolerate gabapentin due to diarrhea.  She reports she is having about 20 days/month with headache.  She describes her typical headache as throbbing into the top of her head.  She denies photophobia, phonophobia, nausea or vomiting.  She reports this does not interfere with her daily activities however it does make her feel irritable.  She reports she has had headaches so long it has just become her norm.  She describes the pain as 6/10.  She has not found nortriptyline to be very helpful for headache and she reports significant constipation with medication.  She denies any other new problems or concerns.  She presents today for follow-up unaccompanied.   REVIEW OF SYSTEMS: Out of a complete  14 system review of symptoms, the patient complains only of the following symptoms, and all other reviewed systems are negative.  Headache  ALLERGIES: Allergies  Allergen Reactions  . Codeine     nausea  . Lexapro [Escitalopram Oxalate]     GI upset  . Lisinopril     Cough   . Lotensin [Benazepril Hcl] Other (See Comments)    headaches headaches    HOME MEDICATIONS: Outpatient Medications Prior to Visit  Medication Sig Dispense Refill  . clonazePAM (KLONOPIN) 0.5 MG tablet Take 0.5 mg by mouth as needed for anxiety.    . Divalproex Sodium (DEPAKOTE PO) Take 250 mg by mouth 3 (three) times daily. Can take up to three times daily     . Docusate Sodium (COLACE PO) Take 1 tablet by mouth at bedtime.    . hydrochlorothiazide (HYDRODIURIL) 25 MG tablet Take 25 mg by mouth daily.    Marland Kitchen losartan (COZAAR) 100 MG tablet Take 1 tablet by mouth daily.  1  . metFORMIN (GLUCOPHAGE) 500 MG tablet Take 500 mg by mouth daily with breakfast.    . Naproxen Sodium (ALEVE) 220 MG CAPS Take by mouth daily.    . Vitamin D, Ergocalciferol, (DRISDOL) 50000 UNITS CAPS Take 50,000 Units by mouth every 7 (seven) days.    Marland Kitchen zolpidem (AMBIEN) 5 MG tablet Take 10 mg by mouth at bedtime as needed for sleep.    . nortriptyline (PAMELOR) 10 MG capsule 3 capsules by mouth at night  30 capsule 0  . aspirin 81 MG tablet Take 81 mg by mouth every other day.     Marland Kitchen atorvastatin (LIPITOR) 10 MG tablet Take 10 mg by mouth daily.    . furosemide (LASIX) 40 MG tablet Take 40 mg by mouth as needed. Reported on 12/17/2015    . potassium chloride (K-DUR) 10 MEQ tablet     . topiramate (TOPAMAX) 25 MG tablet 2 TABLETS EVERY NIGHT AT BEDTIME 120 tablet 0   No facility-administered medications prior to visit.    PAST MEDICAL HISTORY: Past Medical History:  Diagnosis Date  . Headache syndrome 12/18/2016  . High blood pressure   . Hypertension    followed by Dr Gillian Shields medications  . Leg swelling    Hx of     PAST SURGICAL HISTORY: Past Surgical History:  Procedure Laterality Date  . NO PAST SURGERIES      FAMILY HISTORY: Family History  Adopted: Yes  Problem Relation Age of Onset  . Heart attack Maternal Uncle        ? unknown if correct/patient adopted    SOCIAL HISTORY: Social History   Socioeconomic History  . Marital status: Married    Spouse name: Izadora Huser  . Number of children: 2  . Years of education: Teacher, English as a foreign language, almost masters  . Highest education level: Not on file  Occupational History  . Not on file  Tobacco Use  . Smoking status: Never Smoker  . Smokeless tobacco: Never Used  Substance and Sexual Activity  . Alcohol use: Yes    Alcohol/week: 7.0 standard drinks    Types: 7 Standard drinks or equivalent per week  . Drug use: No  . Sexual activity: Yes    Partners: Male    Birth control/protection: Other-see comments, Post-menopausal    Comment: vasectomy  Other Topics Concern  . Not on file  Social History Narrative   Lives with husband   Caffeine use: Drinks mocha every couple days   Tea- daily   Social Determinants of Health   Financial Resource Strain:   . Difficulty of Paying Living Expenses:   Food Insecurity:   . Worried About Charity fundraiser in the Last Year:   . Arboriculturist in the Last Year:   Transportation Needs:   . Film/video editor (Medical):   Marland Kitchen Lack of Transportation (Non-Medical):   Physical Activity:   . Days of Exercise per Week:   . Minutes of Exercise per Session:   Stress:   . Feeling of Stress :   Social Connections:   . Frequency of Communication with Friends and Family:   . Frequency of Social Gatherings with Friends and Family:   . Attends Religious Services:   . Active Member of Clubs or Organizations:   . Attends Archivist Meetings:   Marland Kitchen Marital Status:   Intimate Partner Violence:   . Fear of Current or Ex-Partner:   . Emotionally Abused:   Marland Kitchen Physically Abused:   . Sexually  Abused:    PHYSICAL EXAM  Vitals:   03/16/20 0850  BP: 122/71  Pulse: 79  Temp: (!) 97.2 F (36.2 C)  Weight: 170 lb (77.1 kg)  Height: 5\' 4"  (1.626 m)   Body mass index is 29.18 kg/m.  Generalized: Well developed, in no acute distress   Neurological examination  Mentation: Alert oriented to time, place, history taking. Follows all commands speech and language fluent Cranial nerve II-XII: Pupils were equal round  reactive to light. Extraocular movements were full, visual field were full on confrontational test. Facial sensation and strength were normal.  Head turning and shoulder shrug  were normal and symmetric. Motor: The motor testing reveals 5 over 5 strength of all 4 extremities. Good symmetric motor tone is noted throughout.  Sensory: Sensory testing is intact to soft touch on all 4 extremities. No evidence of extinction is noted.  Coordination: Cerebellar testing reveals good finger-nose-finger and heel-to-shin bilaterally.  Gait and station: Gait is normal. Tandem gait is normal. Romberg is negative. No drift is seen.  Reflexes: Deep tendon reflexes are symmetric and normal bilaterally.   DIAGNOSTIC DATA (LABS, IMAGING, TESTING) - I reviewed patient records, labs, notes, testing and imaging myself where available.  No results found for: WBC, HGB, HCT, MCV, PLT No results found for: NA, K, CL, CO2, GLUCOSE, BUN, CREATININE, CALCIUM, PROT, ALBUMIN, AST, ALT, ALKPHOS, BILITOT, GFRNONAA, GFRAA No results found for: CHOL, HDL, LDLCALC, LDLDIRECT, TRIG, CHOLHDL No results found for: HGBA1C No results found for: VITAMINB12 No results found for: TSH    ASSESSMENT AND PLAN 74 y.o. year old female  has a past medical history of Headache syndrome (12/18/2016), High blood pressure, Hypertension, and Leg swelling. here with:  1.  Headache  Her headaches are currently well controlled on Topamax.  She will remain on Topamax 50 mg at bedtime.  I encouraged her to take aspirin 81 mg  daily, due to evidence of moderate small vessel disease by MRI.  She will continue routine follow-up with her primary doctor, follow-up at this office in 1 year or sooner if needed.  I spent 20 minutes of face-to-face and non-face-to-face time with patient.  This included previsit chart review, lab review, study review, order entry, electronic health record documentation, patient education.  Butler Denmark, AGNP-C, DNP 03/16/2020, 9:25 AM [facility] [address], [place], [place] [address] [phone]

## 2020-03-16 ENCOUNTER — Ambulatory Visit (INDEPENDENT_AMBULATORY_CARE_PROVIDER_SITE_OTHER): Payer: Medicare Other | Admitting: Neurology

## 2020-03-16 ENCOUNTER — Encounter: Payer: Self-pay | Admitting: Neurology

## 2020-03-16 ENCOUNTER — Other Ambulatory Visit: Payer: Self-pay

## 2020-03-16 VITALS — BP 122/71 | HR 79 | Temp 97.2°F | Ht 64.0 in | Wt 170.0 lb

## 2020-03-16 DIAGNOSIS — G4489 Other headache syndrome: Secondary | ICD-10-CM

## 2020-03-16 MED ORDER — TOPIRAMATE 50 MG PO TABS: 50.0000 mg | ORAL_TABLET | Freq: Every day | ORAL | 3 refills | Status: DC

## 2020-03-16 NOTE — Patient Instructions (Signed)
It was great to see you today! Continue the Topamax Would recommend aspirin 81 mg daily  See you back in 1 year

## 2020-03-17 NOTE — Progress Notes (Signed)
I have read the note, and I agree with the clinical assessment and plan.  Amanda Collier K Jairon Ripberger   

## 2020-03-18 DIAGNOSIS — B351 Tinea unguium: Secondary | ICD-10-CM | POA: Diagnosis not present

## 2020-03-18 DIAGNOSIS — I10 Essential (primary) hypertension: Secondary | ICD-10-CM | POA: Diagnosis not present

## 2020-03-18 DIAGNOSIS — Z23 Encounter for immunization: Secondary | ICD-10-CM | POA: Diagnosis not present

## 2020-03-18 DIAGNOSIS — E785 Hyperlipidemia, unspecified: Secondary | ICD-10-CM | POA: Diagnosis not present

## 2020-03-18 DIAGNOSIS — E119 Type 2 diabetes mellitus without complications: Secondary | ICD-10-CM | POA: Diagnosis not present

## 2020-04-20 ENCOUNTER — Other Ambulatory Visit: Payer: Self-pay | Admitting: Neurology

## 2020-05-26 ENCOUNTER — Telehealth: Payer: Self-pay

## 2020-05-26 NOTE — Telephone Encounter (Signed)
Patient is still having a persistent headache and is wondering if Amanda Collier wants her to increase the topamax, she is currently taking 50mg  at bedtime.   Also, a reminder that she is on the [facility] and they are 3 hours behind [place].

## 2020-05-27 MED ORDER — TOPIRAMATE 50 MG PO TABS: 75.0000 mg | ORAL_TABLET | Freq: Every day | ORAL | 0 refills | Status: DC

## 2020-05-27 NOTE — Telephone Encounter (Signed)
I called pt and she is in [place], [place] at this time.  This is normal routine for her.  Last 3 wks noted increase in nagging daily headaches (not migraine).  No correlated to location she says.  Triggers not known.  ? Increase topamax, if change to other med [facility] in system.

## 2020-05-27 NOTE — Telephone Encounter (Signed)
Sure she may increase to 75 mg at bedtime, can even gradually go up to 100 mg at bedtime if need. Okay sent to pharmacy, I didn't sent with any refills, I assume she is coming back. # 120 tablets.

## 2020-05-27 NOTE — Addendum Note (Signed)
Addended by: Suzzanne Cloud on: 05/27/2020 10:47 AM   Modules accepted: Orders

## 2020-05-27 NOTE — Telephone Encounter (Signed)
Pt called wanting to inform provider that she is up early and it is ok to call now. Please advise.

## 2020-05-27 NOTE — Telephone Encounter (Signed)
Pt called back and wanted to relay that she has seen a skin surgeon and SS/NP felt like she needed to be seen the pt would contact her surgeon in [place] and get referral.  I stated SS/NP message and she can try the topamax to 75,g po daily and even increase to 100mg  po daily if needed.  Pt had enough medication and will [place] what she has for now.  Was informed that prescription was sent to her [place] if needed.

## 2020-06-07 ENCOUNTER — Telehealth: Payer: Self-pay | Admitting: Physical Medicine and Rehabilitation

## 2020-06-07 NOTE — Telephone Encounter (Signed)
Called patient. Call cannot be completed at this time.

## 2020-06-07 NOTE — Telephone Encounter (Signed)
Please advise on scheduling self-referral for right ankle pain.

## 2020-06-07 NOTE — Telephone Encounter (Signed)
You can tell her I am happy to see her but for ankle pain unable to bare weight I feel she needs to see Dr. Sharol Given our foot and ankle specialist or Dr. Junius Roads sports medicine. I am not sure I would be efficient in the evaluation.

## 2020-06-07 NOTE — Telephone Encounter (Signed)
Patient called requesting an appt. Patient stated to be a new patient and 2 of her friends recommended Dr. Ernestina Patches. Patient states she fall 3 times due to right ankle pains. Can't really bare weight on it. Please contact this patient about this matter at [phone].

## 2020-06-08 NOTE — Telephone Encounter (Signed)
Called patient a second time. Call cannot be completed at this time. Unable to leave message.

## 2020-06-08 NOTE — Telephone Encounter (Signed)
Scheduled with Dr. Sharol Given on 8/2.

## 2020-06-14 ENCOUNTER — Encounter: Payer: Self-pay | Admitting: Specialist

## 2020-06-14 ENCOUNTER — Ambulatory Visit (INDEPENDENT_AMBULATORY_CARE_PROVIDER_SITE_OTHER): Payer: Medicare Other | Admitting: Specialist

## 2020-06-14 ENCOUNTER — Ambulatory Visit: Payer: Medicare Other | Admitting: Orthopedic Surgery

## 2020-06-14 ENCOUNTER — Other Ambulatory Visit: Payer: Self-pay

## 2020-06-14 ENCOUNTER — Ambulatory Visit (INDEPENDENT_AMBULATORY_CARE_PROVIDER_SITE_OTHER): Payer: Medicare Other

## 2020-06-14 ENCOUNTER — Ambulatory Visit: Payer: Self-pay

## 2020-06-14 VITALS — BP 121/76 | HR 75 | Ht 64.0 in | Wt 170.0 lb

## 2020-06-14 DIAGNOSIS — S8265XA Nondisplaced fracture of lateral malleolus of left fibula, initial encounter for closed fracture: Secondary | ICD-10-CM

## 2020-06-14 DIAGNOSIS — R2689 Other abnormalities of gait and mobility: Secondary | ICD-10-CM | POA: Diagnosis not present

## 2020-06-14 DIAGNOSIS — M2141 Flat foot [pes planus] (acquired), right foot: Secondary | ICD-10-CM | POA: Diagnosis not present

## 2020-06-14 DIAGNOSIS — S93431A Sprain of tibiofibular ligament of right ankle, initial encounter: Secondary | ICD-10-CM | POA: Diagnosis not present

## 2020-06-14 DIAGNOSIS — M25572 Pain in left ankle and joints of left foot: Secondary | ICD-10-CM | POA: Diagnosis not present

## 2020-06-14 DIAGNOSIS — M6700 Short Achilles tendon (acquired), unspecified ankle: Secondary | ICD-10-CM

## 2020-06-14 DIAGNOSIS — M25571 Pain in right ankle and joints of right foot: Secondary | ICD-10-CM | POA: Diagnosis not present

## 2020-06-14 DIAGNOSIS — R296 Repeated falls: Secondary | ICD-10-CM

## 2020-06-14 DIAGNOSIS — S8264XA Nondisplaced fracture of lateral malleolus of right fibula, initial encounter for closed fracture: Secondary | ICD-10-CM

## 2020-06-14 NOTE — Progress Notes (Signed)
Office Visit Note   Patient: Amanda Collier           Date of Birth: October 14, 1946           MRN: [id number] Visit Date: 06/14/2020              Requested by: Maurice Small, MD [address] [place],  [place] [address] PCP: Maurice Small, MD   Assessment & Plan: Visit Diagnoses:  1. Pain in right ankle and joints of right foot   2. Pain in left ankle and joints of left foot   3. Balance disorder   4. Nondisplaced fracture of lateral malleolus of left fibula, initial encounter for closed fracture   5. Closed nondisplaced fracture of lateral malleolus of right fibula, initial encounter   6. Sprain of tibiofibular ligament of right ankle, initial encounter   7. Acquired pes planovalgus of right foot   8. Contracture of Achilles tendon, unspecified laterality     Plan: Use a walker for ambulation to prevent falling. Ice and elevation of the legs to decrease swelling. Baby aspirin twice a day to prevent DVT (blood clots due to inactivity) Both ankles require follow up evaluation with repeat xrays to assess for new bone formation in the areas of lucency both fibula (lateral malleolii) You are at risk of worsening the injury of both ankles due to repeative falls and lack of ability to Avoid injury due to apparent decrease sense of balance and coordination. Peripheral neuropathy Due to diabetes may also be affecting your ability to prevent further injury. I agree that return to [facility] to be able to be with some one is important to decrease your Responsibilities that are driving the need to be placing yourself at risk of falling. A thorough evaluation by a neurologist to assess the impact of changes seen on your MRI and  Whether further changes are occurring is important for preventing further falls and physical  Therapy when the ankles are healed may help. I recommend that you take copies of both the radiographs from our office today and obtain the MRI of your brain from  [place] so that these may be reviewed at follow up in  [place].  Extra strength tylenol for pain one every 4-6 hours but no more than 6 per day.  Follow-Up Instructions: Return in about 2 weeks (around 06/28/2020).   Orders:  Orders Placed This Encounter  Procedures  . XR Ankle Complete Right  . XR Ankle Complete Left   No orders of the defined types were placed in this encounter.     Procedures: No procedures performed   Clinical Data: No additional findings.   Subjective: Chief Complaint  Patient presents with  . Right Ankle - Pain    History of several falls over the last month  . Left Ankle - Pain    History of several falls over the last month    74 year old female with history of having to work with a secondary house out in [place], in [place]. The roof needed work and She took off acutely to go check the home out. She went to the airport and while at the check in at [facility] to [place] she fell, she says the feet  Martin Majestic out from her and she has had this just since her trip to [place]. She reports her husband told her see was shuffling. Prior to this see has been seen by Dr. Jannifer Franklin for headaches.  She  drives fine and has to wear glasses. She has fallen 3 times, first time in the airport on the way to return 10 days ago 7/22. Then when having carpets were being repaired here after the roof fell in. She came in at 1130 PM to [facility] and 7/22 fell coming into house on hardwood floors coming into the bathroom and she was fatigued and there as stress at the time. She reports going into the house to exam the wet carpets 7/25 right ankle was painful and she took a fall braced herself with her right arm. She has had The power go out and walked into the kitchen and sat down and at the kitchen table she slid the chair back but did not slide it far enough and both feet went out from under her. She  Was driven here by a friend. She has been tested for osteoporosis  and her last test was normal at the breast imaging center. She has has swelling in her legs and pain with standing on both ankles. Last saw Dr. Justin Mend in May, 03/18/2020. She is holding onto railing to get up and down stairs ( front and back staircase in the car). Her neighbor was Dr. Lindwood Qua.  Now with pain with standing and walking and weight bearing both legs and ankle.    Review of Systems  Constitutional: Negative for activity change, appetite change, chills, diaphoresis, fatigue, fever and unexpected weight change.  HENT: Positive for rhinorrhea and sinus pain. Negative for congestion, dental problem, drooling, ear discharge, ear pain, facial swelling, hearing loss, mouth sores, nosebleeds, postnasal drip, sinus pressure, sneezing, sore throat, tinnitus, trouble swallowing and voice change.   Eyes: Negative.  Negative for photophobia, pain, discharge, redness, itching and visual disturbance.  Respiratory: Negative.  Negative for apnea, cough, choking, chest tightness, shortness of breath, wheezing and stridor.   Cardiovascular: Positive for leg swelling. Negative for chest pain and palpitations.  Gastrointestinal: Negative.  Negative for abdominal distention, abdominal pain, anal bleeding, blood in stool, constipation, diarrhea, nausea and rectal pain.  Endocrine: Negative.  Negative for cold intolerance, heat intolerance, polydipsia, polyphagia and polyuria.  Genitourinary: Negative.  Negative for difficulty urinating, dyspareunia, dysuria, enuresis, flank pain, frequency and urgency.  Musculoskeletal: Negative.  Negative for arthralgias, back pain, gait problem, joint swelling, myalgias, neck pain and neck stiffness.  Skin: Negative.  Negative for color change, pallor, rash and wound.  Allergic/Immunologic: Negative.  Negative for environmental allergies, food allergies and immunocompromised state.  Neurological: Positive for weakness. Negative for dizziness, tremors, seizures, syncope,  facial asymmetry, speech difficulty, light-headedness, numbness and headaches.  Hematological: Negative for adenopathy. Does not bruise/bleed easily.  Psychiatric/Behavioral: Negative.  Negative for agitation, behavioral problems, confusion, decreased concentration, dysphoric mood, hallucinations, self-injury, sleep disturbance and suicidal ideas. The patient is not nervous/anxious and is not hyperactive.      Objective: Vital Signs: BP 121/76 (BP Location: Left Arm, Patient Position: Sitting)   Pulse 75   Ht 5\' 4"  (1.626 m)   Wt 170 lb (77.1 kg)   LMP 11/14/2003   BMI 29.18 kg/m   Physical Exam  Right Ankle Exam   Tenderness  The patient is experiencing no tenderness. Swelling: mild  Range of Motion  Dorsiflexion:  0 abnormal  Plantar flexion:  20 abnormal  Eversion: 20  Inversion: 25   Muscle Strength  Dorsiflexion:  4/5 Plantar flexion:  5/5 Anterior tibial:  5/5 Posterior tibial:  5/5 Gastrocsoleus:  5/5 Peroneal muscle:  5/5  Tests  Anterior  drawer: negative Varus tilt: negative  Other  Erythema: absent Scars: absent Sensation: normal Pulse: present   Comments:  Mild swelling right medial ankle planovalgus right foot. Non tender right lateral malleolus and medial malleolus. Right heel cord tightness moderately severe with neutral dorsiflexion of the right ankle.     Left Ankle Exam   Tenderness  The patient is experiencing tenderness in the medial malleolus.  Swelling: moderate  Range of Motion  Dorsiflexion:  10 abnormal  Plantar flexion: 25  Eversion: 15  Inversion: 20   Muscle Strength  Dorsiflexion:  5/5  Plantar flexion:  5/5  Anterior tibial:  5/5  Posterior tibial:  5/5 Gastrocsoleus:  5/5  Comments:  Left ankle with medial swelling and ankle effusion, no echymosis, edema left leg distal 1/3, venous skin changes. No crepitus and ankle motion is pain free. Mild left ankle heel cord tightness.       Specialty Comments:  No  specialty comments available.  Imaging: XR Ankle Complete Left  Result Date: 06/14/2020 AP and lateral left ankle with a oblique lucency through the distal 1/3 of the fibula, nondisplaced. No widening of the mortise. Lateral radiograph with a cortical infraction seen that suggests a failure in compression of the distal fibula. Radiographic suggestion of nondisplaced distal fibula fracture.     PMFS History: Patient Active Problem List   Diagnosis Date Noted  . Headache syndrome 12/18/2016  . Essential hypertension 12/17/2015  . Elevated lipids 12/17/2015  . Borderline diabetes 12/17/2015   Past Medical History:  Diagnosis Date  . Headache syndrome 12/18/2016  . High blood pressure   . Hypertension    followed by Dr Gillian Shields medications  . Leg swelling    Hx of    Family History  Adopted: Yes  Problem Relation Age of Onset  . Heart attack Maternal Uncle        ? unknown if correct/patient adopted    Past Surgical History:  Procedure Laterality Date  . NO PAST SURGERIES     Social History   Occupational History  . Not on file  Tobacco Use  . Smoking status: Never Smoker  . Smokeless tobacco: Never Used  Vaping Use  . Vaping Use: Never used  Substance and Sexual Activity  . Alcohol use: Yes    Alcohol/week: 7.0 standard drinks    Types: 7 Standard drinks or equivalent per week  . Drug use: No  . Sexual activity: Yes    Partners: Male    Birth control/protection: Other-see comments, Post-menopausal    Comment: vasectomy

## 2020-06-14 NOTE — Patient Instructions (Addendum)
Use a walker for ambulation to prevent falling. Ice and elevation of the legs to decrease swelling. Baby aspirin twice a day to prevent DVT (blood clots due to inactivity) Both ankles require follow up evaluation with repeat xrays to assess for new bone formation in the areas of lucency both fibula (lateral malleolii) You are at risk of worsening the injury of both ankles due to repeative falls and lack of ability to Avoid injury due to apparent decrease sense of balance and coordination. Peripheral neuropathy Due to diabetes may also be affecting your ability to prevent further injury. I agree that return to [facility] to be able to be with some one is important to decrease your Responsibilities that are driving the need to be placing yourself at risk of falling. A thorough evaluation by a neurologist to assess the impact of changes seen on your MRI and  Whether further changes are occurring is important for preventing further falls and physical  Therapy when the ankles are healed may help. I recommend that you take copies of both the radiographs from our office today and obtain the MRI of your brain from [facility] so that these may be reviewed at follow up in  [place].  Extra strength tylenol for pain one every 4-6 hours but no more than 6 per day.

## 2020-06-21 ENCOUNTER — Other Ambulatory Visit: Payer: Self-pay | Admitting: Specialist

## 2020-06-21 ENCOUNTER — Other Ambulatory Visit: Payer: Self-pay | Admitting: Radiology

## 2020-06-21 ENCOUNTER — Telehealth: Payer: Self-pay | Admitting: Specialist

## 2020-06-21 DIAGNOSIS — R296 Repeated falls: Secondary | ICD-10-CM

## 2020-06-21 DIAGNOSIS — R2689 Other abnormalities of gait and mobility: Secondary | ICD-10-CM

## 2020-06-21 NOTE — Telephone Encounter (Signed)
Pt called requesting Dr.Nitka send over a urgent referral to the [facility] neurology so she can be seen in one of their soonest appts; pt would like a CB when this is done  [phone]

## 2020-06-21 NOTE — Telephone Encounter (Signed)
Needs to see her primary care MD for evaluation to determine if she has a medical cause for the numerous falls.

## 2020-06-21 NOTE — Telephone Encounter (Signed)
I called and advised on message from Dr. Louanne Skye, she states that she will call and get an appt with Dr. Justin Mend

## 2020-06-21 NOTE — Telephone Encounter (Signed)
Patient states that she did not go back out [place] and she states that she would like to get started in PT now here.  She states that she has fallen more since she left our office.

## 2020-06-22 ENCOUNTER — Telehealth: Payer: Self-pay | Admitting: Neurology

## 2020-06-22 DIAGNOSIS — R2689 Other abnormalities of gait and mobility: Secondary | ICD-10-CM | POA: Diagnosis not present

## 2020-06-22 DIAGNOSIS — Z79899 Other long term (current) drug therapy: Secondary | ICD-10-CM | POA: Diagnosis not present

## 2020-06-22 DIAGNOSIS — E1169 Type 2 diabetes mellitus with other specified complication: Secondary | ICD-10-CM | POA: Diagnosis not present

## 2020-06-22 DIAGNOSIS — I10 Essential (primary) hypertension: Secondary | ICD-10-CM | POA: Diagnosis not present

## 2020-06-22 DIAGNOSIS — Z9181 History of falling: Secondary | ICD-10-CM | POA: Diagnosis not present

## 2020-06-22 NOTE — Telephone Encounter (Signed)
Please check on patient, I got a message from the work in doctor for an urgent referral coming in to see Dr. Jannifer Franklin for a new problem. A slot is being held for her next week to see Dr. Jannifer Franklin. Make sure nothing acute going on, looks like she is seeing her primary doctor soon. Referral related to balance/gait problems. Can I assist in anyway?

## 2020-06-22 NOTE — Telephone Encounter (Signed)
Called and spoke with pt. She has appt with Dr. Jannifer Franklin 07/02/20 at 10:45am. Her husband wrote down appt date/time. She feels okay to wait to see Dr. Jannifer Franklin then. She was getting ready to go see her PCP today while on the phone. Nothing further needed.

## 2020-06-23 ENCOUNTER — Ambulatory Visit (HOSPITAL_COMMUNITY)
Admission: RE | Admit: 2020-06-23 | Discharge: 2020-06-23 | Disposition: A | Discharge status: Home / Self Care | Payer: Medicare Other | Source: Ambulatory Visit | Attending: Neurology | Admitting: Neurology

## 2020-06-23 ENCOUNTER — Ambulatory Visit (INDEPENDENT_AMBULATORY_CARE_PROVIDER_SITE_OTHER): Payer: Medicare Other | Admitting: Neurology

## 2020-06-23 ENCOUNTER — Encounter: Payer: Self-pay | Admitting: Neurology

## 2020-06-23 ENCOUNTER — Other Ambulatory Visit: Payer: Self-pay

## 2020-06-23 ENCOUNTER — Telehealth: Payer: Self-pay | Admitting: Neurology

## 2020-06-23 ENCOUNTER — Inpatient Hospital Stay (HOSPITAL_COMMUNITY)
Admission: EM | Admit: 2020-06-23 | Discharge: 2020-07-08 | DRG: 027 | Disposition: A | Discharge status: Rehabilitation Facility | Payer: Medicare Other | Source: Ambulatory Visit | Attending: Neurosurgery | Admitting: Neurosurgery

## 2020-06-23 VITALS — BP 128/70 | HR 73

## 2020-06-23 DIAGNOSIS — M4802 Spinal stenosis, cervical region: Secondary | ICD-10-CM | POA: Insufficient documentation

## 2020-06-23 DIAGNOSIS — E1165 Type 2 diabetes mellitus with hyperglycemia: Secondary | ICD-10-CM | POA: Diagnosis present

## 2020-06-23 DIAGNOSIS — B9620 Unspecified Escherichia coli [E. coli] as the cause of diseases classified elsewhere: Secondary | ICD-10-CM | POA: Diagnosis not present

## 2020-06-23 DIAGNOSIS — I6782 Cerebral ischemia: Secondary | ICD-10-CM | POA: Insufficient documentation

## 2020-06-23 DIAGNOSIS — R29810 Facial weakness: Secondary | ICD-10-CM | POA: Diagnosis not present

## 2020-06-23 DIAGNOSIS — I618 Other nontraumatic intracerebral hemorrhage: Secondary | ICD-10-CM | POA: Diagnosis not present

## 2020-06-23 DIAGNOSIS — Z7984 Long term (current) use of oral hypoglycemic drugs: Secondary | ICD-10-CM

## 2020-06-23 DIAGNOSIS — F1010 Alcohol abuse, uncomplicated: Secondary | ICD-10-CM

## 2020-06-23 DIAGNOSIS — S8262XD Displaced fracture of lateral malleolus of left fibula, subsequent encounter for closed fracture with routine healing: Secondary | ICD-10-CM | POA: Diagnosis not present

## 2020-06-23 DIAGNOSIS — S065X9A Traumatic subdural hemorrhage with loss of consciousness of unspecified duration, initial encounter: Secondary | ICD-10-CM

## 2020-06-23 DIAGNOSIS — G441 Vascular headache, not elsewhere classified: Secondary | ICD-10-CM | POA: Diagnosis present

## 2020-06-23 DIAGNOSIS — S8261XD Displaced fracture of lateral malleolus of right fibula, subsequent encounter for closed fracture with routine healing: Secondary | ICD-10-CM | POA: Diagnosis not present

## 2020-06-23 DIAGNOSIS — Z885 Allergy status to narcotic agent status: Secondary | ICD-10-CM

## 2020-06-23 DIAGNOSIS — S065XAA Traumatic subdural hemorrhage with loss of consciousness status unknown, initial encounter: Secondary | ICD-10-CM

## 2020-06-23 DIAGNOSIS — Z6828 Body mass index (BMI) 28.0-28.9, adult: Secondary | ICD-10-CM | POA: Diagnosis not present

## 2020-06-23 DIAGNOSIS — R269 Unspecified abnormalities of gait and mobility: Secondary | ICD-10-CM

## 2020-06-23 DIAGNOSIS — K649 Unspecified hemorrhoids: Secondary | ICD-10-CM | POA: Diagnosis not present

## 2020-06-23 DIAGNOSIS — S065X0S Traumatic subdural hemorrhage without loss of consciousness, sequela: Secondary | ICD-10-CM | POA: Diagnosis not present

## 2020-06-23 DIAGNOSIS — R32 Unspecified urinary incontinence: Secondary | ICD-10-CM | POA: Diagnosis present

## 2020-06-23 DIAGNOSIS — W1830XD Fall on same level, unspecified, subsequent encounter: Secondary | ICD-10-CM

## 2020-06-23 DIAGNOSIS — J320 Chronic maxillary sinusitis: Secondary | ICD-10-CM | POA: Diagnosis not present

## 2020-06-23 DIAGNOSIS — Z79899 Other long term (current) drug therapy: Secondary | ICD-10-CM

## 2020-06-23 DIAGNOSIS — M47812 Spondylosis without myelopathy or radiculopathy, cervical region: Secondary | ICD-10-CM | POA: Diagnosis not present

## 2020-06-23 DIAGNOSIS — E669 Obesity, unspecified: Secondary | ICD-10-CM | POA: Diagnosis present

## 2020-06-23 DIAGNOSIS — R2681 Unsteadiness on feet: Secondary | ICD-10-CM | POA: Insufficient documentation

## 2020-06-23 DIAGNOSIS — W19XXXS Unspecified fall, sequela: Secondary | ICD-10-CM

## 2020-06-23 DIAGNOSIS — Z9889 Other specified postprocedural states: Secondary | ICD-10-CM

## 2020-06-23 DIAGNOSIS — I6203 Nontraumatic chronic subdural hemorrhage: Secondary | ICD-10-CM | POA: Diagnosis present

## 2020-06-23 DIAGNOSIS — T1490XA Injury, unspecified, initial encounter: Secondary | ICD-10-CM

## 2020-06-23 DIAGNOSIS — W19XXXA Unspecified fall, initial encounter: Secondary | ICD-10-CM | POA: Insufficient documentation

## 2020-06-23 DIAGNOSIS — Z888 Allergy status to other drugs, medicaments and biological substances status: Secondary | ICD-10-CM

## 2020-06-23 DIAGNOSIS — S065X0D Traumatic subdural hemorrhage without loss of consciousness, subsequent encounter: Secondary | ICD-10-CM | POA: Diagnosis not present

## 2020-06-23 DIAGNOSIS — R296 Repeated falls: Secondary | ICD-10-CM | POA: Diagnosis not present

## 2020-06-23 DIAGNOSIS — I10 Essential (primary) hypertension: Secondary | ICD-10-CM | POA: Diagnosis present

## 2020-06-23 DIAGNOSIS — E871 Hypo-osmolality and hyponatremia: Secondary | ICD-10-CM | POA: Diagnosis not present

## 2020-06-23 DIAGNOSIS — S065X0A Traumatic subdural hemorrhage without loss of consciousness, initial encounter: Principal | ICD-10-CM | POA: Diagnosis present

## 2020-06-23 DIAGNOSIS — S065X1S Traumatic subdural hemorrhage with loss of consciousness of 30 minutes or less, sequela: Secondary | ICD-10-CM | POA: Diagnosis not present

## 2020-06-23 DIAGNOSIS — G4700 Insomnia, unspecified: Secondary | ICD-10-CM | POA: Diagnosis present

## 2020-06-23 DIAGNOSIS — K5900 Constipation, unspecified: Secondary | ICD-10-CM | POA: Diagnosis not present

## 2020-06-23 DIAGNOSIS — R471 Dysarthria and anarthria: Secondary | ICD-10-CM | POA: Diagnosis not present

## 2020-06-23 DIAGNOSIS — M545 Low back pain: Secondary | ICD-10-CM | POA: Diagnosis not present

## 2020-06-23 DIAGNOSIS — K592 Neurogenic bowel, not elsewhere classified: Secondary | ICD-10-CM | POA: Diagnosis not present

## 2020-06-23 DIAGNOSIS — Z7982 Long term (current) use of aspirin: Secondary | ICD-10-CM

## 2020-06-23 DIAGNOSIS — Z20822 Contact with and (suspected) exposure to covid-19: Secondary | ICD-10-CM | POA: Diagnosis present

## 2020-06-23 DIAGNOSIS — N319 Neuromuscular dysfunction of bladder, unspecified: Secondary | ICD-10-CM | POA: Diagnosis present

## 2020-06-23 DIAGNOSIS — R482 Apraxia: Secondary | ICD-10-CM | POA: Diagnosis present

## 2020-06-23 DIAGNOSIS — D62 Acute posthemorrhagic anemia: Secondary | ICD-10-CM | POA: Diagnosis not present

## 2020-06-23 DIAGNOSIS — I6201 Nontraumatic acute subdural hemorrhage: Secondary | ICD-10-CM | POA: Diagnosis not present

## 2020-06-23 DIAGNOSIS — G43909 Migraine, unspecified, not intractable, without status migrainosus: Secondary | ICD-10-CM | POA: Diagnosis present

## 2020-06-23 DIAGNOSIS — N390 Urinary tract infection, site not specified: Secondary | ICD-10-CM | POA: Diagnosis not present

## 2020-06-23 DIAGNOSIS — Z9181 History of falling: Secondary | ICD-10-CM | POA: Insufficient documentation

## 2020-06-23 DIAGNOSIS — F419 Anxiety disorder, unspecified: Secondary | ICD-10-CM | POA: Diagnosis present

## 2020-06-23 DIAGNOSIS — S8262XS Displaced fracture of lateral malleolus of left fibula, sequela: Secondary | ICD-10-CM | POA: Diagnosis not present

## 2020-06-23 DIAGNOSIS — E119 Type 2 diabetes mellitus without complications: Secondary | ICD-10-CM | POA: Diagnosis present

## 2020-06-23 DIAGNOSIS — S065X9D Traumatic subdural hemorrhage with loss of consciousness of unspecified duration, subsequent encounter: Secondary | ICD-10-CM | POA: Diagnosis not present

## 2020-06-23 DIAGNOSIS — R2689 Other abnormalities of gait and mobility: Secondary | ICD-10-CM | POA: Diagnosis not present

## 2020-06-23 DIAGNOSIS — S8266XD Nondisplaced fracture of lateral malleolus of unspecified fibula, subsequent encounter for closed fracture with routine healing: Secondary | ICD-10-CM | POA: Diagnosis not present

## 2020-06-23 DIAGNOSIS — A499 Bacterial infection, unspecified: Secondary | ICD-10-CM | POA: Diagnosis not present

## 2020-06-23 DIAGNOSIS — G935 Compression of brain: Secondary | ICD-10-CM | POA: Diagnosis not present

## 2020-06-23 DIAGNOSIS — M5021 Other cervical disc displacement,  high cervical region: Secondary | ICD-10-CM | POA: Diagnosis not present

## 2020-06-23 DIAGNOSIS — I6200 Nontraumatic subdural hemorrhage, unspecified: Secondary | ICD-10-CM | POA: Diagnosis not present

## 2020-06-23 DIAGNOSIS — I82542 Chronic embolism and thrombosis of left tibial vein: Secondary | ICD-10-CM | POA: Diagnosis not present

## 2020-06-23 DIAGNOSIS — I959 Hypotension, unspecified: Secondary | ICD-10-CM | POA: Diagnosis not present

## 2020-06-23 DIAGNOSIS — M50223 Other cervical disc displacement at C6-C7 level: Secondary | ICD-10-CM | POA: Diagnosis not present

## 2020-06-23 DIAGNOSIS — E785 Hyperlipidemia, unspecified: Secondary | ICD-10-CM | POA: Diagnosis present

## 2020-06-23 DIAGNOSIS — F329 Major depressive disorder, single episode, unspecified: Secondary | ICD-10-CM | POA: Diagnosis present

## 2020-06-23 DIAGNOSIS — E876 Hypokalemia: Secondary | ICD-10-CM | POA: Diagnosis not present

## 2020-06-23 DIAGNOSIS — I6202 Nontraumatic subacute subdural hemorrhage: Secondary | ICD-10-CM | POA: Diagnosis not present

## 2020-06-23 DIAGNOSIS — S8261XS Displaced fracture of lateral malleolus of right fibula, sequela: Secondary | ICD-10-CM | POA: Diagnosis not present

## 2020-06-23 DIAGNOSIS — G9389 Other specified disorders of brain: Secondary | ICD-10-CM | POA: Diagnosis not present

## 2020-06-23 DIAGNOSIS — Z789 Other specified health status: Secondary | ICD-10-CM

## 2020-06-23 DIAGNOSIS — S065X1D Traumatic subdural hemorrhage with loss of consciousness of 30 minutes or less, subsequent encounter: Secondary | ICD-10-CM | POA: Diagnosis not present

## 2020-06-23 LAB — COMPREHENSIVE METABOLIC PANEL
ALT: 17 U/L (ref 0–44)
AST: 18 U/L (ref 15–41)
Albumin: 3.7 g/dL (ref 3.5–5.0)
Alkaline Phosphatase: 55 U/L (ref 38–126)
Anion gap: 10 (ref 5–15)
BUN: 15 mg/dL (ref 8–23)
CO2: 23 mmol/L (ref 22–32)
Calcium: 9.5 mg/dL (ref 8.9–10.3)
Chloride: 109 mmol/L (ref 98–111)
Creatinine, Ser: 0.82 mg/dL (ref 0.44–1.00)
GFR calc Af Amer: >60 mL/min (ref >60)
GFR calc non Af Amer: >60 mL/min (ref >60)
Glucose, Bld: 102 mg/dL — ABNORMAL HIGH (ref 70–99)
Potassium: 3.3 mmol/L — ABNORMAL LOW (ref 3.5–5.1)
Sodium: 142 mmol/L (ref 135–145)
Total Bilirubin: 0.7 mg/dL (ref 0.3–1.2)
Total Protein: 6.2 g/dL — ABNORMAL LOW (ref 6.5–8.1)

## 2020-06-23 LAB — CBC WITH DIFFERENTIAL/PLATELET
Abs Immature Granulocytes: 0.01 10*3/uL (ref 0.00–0.07)
Basophils Absolute: 0 10*3/uL (ref 0.0–0.1)
Basophils Relative: 1 %
Eosinophils Absolute: 0.1 10*3/uL (ref 0.0–0.5)
Eosinophils Relative: 3 %
HCT: 38.7 % (ref 36.0–46.0)
Hemoglobin: 12.8 g/dL (ref 12.0–15.0)
Immature Granulocytes: 0 %
Lymphocytes Relative: 33 %
Lymphs Abs: 1.5 10*3/uL (ref 0.7–4.0)
MCH: 32.8 pg (ref 26.0–34.0)
MCHC: 33.1 g/dL (ref 30.0–36.0)
MCV: 99.2 fL (ref 80.0–100.0)
Monocytes Absolute: 0.5 10*3/uL (ref 0.1–1.0)
Monocytes Relative: 12 %
Neutro Abs: 2.2 10*3/uL (ref 1.7–7.7)
Neutrophils Relative %: 51 %
Platelets: 278 10*3/uL (ref 150–400)
RBC: 3.9 MIL/uL (ref 3.87–5.11)
RDW: 12.8 % (ref 11.5–15.5)
WBC: 4.4 10*3/uL (ref 4.0–10.5)
nRBC: 0 % (ref 0.0–0.2)

## 2020-06-23 LAB — PROTIME-INR
INR: 1 (ref 0.8–1.2)
Prothrombin Time: 13 seconds (ref 11.4–15.2)

## 2020-06-23 LAB — SARS CORONAVIRUS 2 BY RT PCR (HOSPITAL ORDER, PERFORMED IN ~~LOC~~ HOSPITAL LAB): SARS Coronavirus 2: NEGATIVE

## 2020-06-23 LAB — APTT: aPTT: 29 seconds (ref 24–36)

## 2020-06-23 MED ORDER — HYDROCHLOROTHIAZIDE 25 MG PO TABS: 25.0000 mg | ORAL_TABLET | Freq: Every day | ORAL | Status: DC

## 2020-06-23 MED ORDER — METFORMIN HCL 500 MG PO TABS
500.0000 mg | ORAL_TABLET | Freq: Every day | ORAL | Status: DC
  Filled 2020-06-23: qty 1

## 2020-06-23 MED ORDER — ONDANSETRON HCL 4 MG PO TABS: 4.0000 mg | ORAL_TABLET | Freq: Four times a day (QID) | ORAL | Status: DC | PRN

## 2020-06-23 MED ORDER — TOPIRAMATE 25 MG PO TABS
75.0000 mg | ORAL_TABLET | Freq: Every day | ORAL | Status: DC
  Administered 2020-06-23 – 2020-07-07 (×15): 75 mg via ORAL
  Filled 2020-06-23 (×15): qty 3

## 2020-06-23 MED ORDER — SODIUM CHLORIDE 0.9% FLUSH
3.0000 mL | Freq: Two times a day (BID) | INTRAVENOUS | Status: DC
  Administered 2020-06-24 – 2020-07-08 (×24): 3 mL via INTRAVENOUS

## 2020-06-23 MED ORDER — VITAMIN D (ERGOCALCIFEROL) 1.25 MG (50000 UNIT) PO CAPS
50000.0000 [IU] | ORAL_CAPSULE | ORAL | Status: DC
  Administered 2020-06-30 – 2020-07-07 (×2): 50000 [IU] via ORAL
  Filled 2020-06-23 (×2): qty 1

## 2020-06-23 MED ORDER — CLONAZEPAM 0.5 MG PO TABS
0.5000 mg | ORAL_TABLET | Freq: Every evening | ORAL | Status: DC | PRN
  Administered 2020-06-26 – 2020-07-07 (×4): 0.5 mg via ORAL
  Filled 2020-06-23 (×4): qty 1

## 2020-06-23 MED ORDER — DIVALPROEX SODIUM ER 250 MG PO TB24
250.0000 mg | ORAL_TABLET | Freq: Every day | ORAL | Status: DC
  Administered 2020-06-24 – 2020-07-08 (×15): 250 mg via ORAL
  Filled 2020-06-23 (×15): qty 1

## 2020-06-23 MED ORDER — SODIUM CHLORIDE 0.9% FLUSH
3.0000 mL | INTRAVENOUS | Status: DC | PRN
  Administered 2020-07-06: 3 mL via INTRAVENOUS

## 2020-06-23 MED ORDER — ATORVASTATIN CALCIUM 10 MG PO TABS
10.0000 mg | ORAL_TABLET | Freq: Every day | ORAL | Status: DC
  Administered 2020-06-24 – 2020-07-08 (×15): 10 mg via ORAL
  Filled 2020-06-23 (×15): qty 1

## 2020-06-23 MED ORDER — ONDANSETRON HCL 4 MG/2ML IJ SOLN
4.0000 mg | Freq: Four times a day (QID) | INTRAMUSCULAR | Status: DC | PRN
  Administered 2020-07-08: 4 mg via INTRAVENOUS
  Filled 2020-06-23: qty 2

## 2020-06-23 MED ORDER — POTASSIUM CHLORIDE IN NACL 20-0.9 MEQ/L-% IV SOLN
INTRAVENOUS | Status: DC
  Filled 2020-06-23 (×4): qty 1000

## 2020-06-23 MED ORDER — SODIUM CHLORIDE 0.9 % IV SOLN: 250.0000 mL | INTRAVENOUS | Status: DC | PRN

## 2020-06-23 MED ORDER — ACETAMINOPHEN 325 MG PO TABS
650.0000 mg | ORAL_TABLET | Freq: Four times a day (QID) | ORAL | Status: DC | PRN
  Administered 2020-06-24 – 2020-06-28 (×6): 650 mg via ORAL
  Filled 2020-06-23 (×6): qty 2

## 2020-06-23 MED ORDER — LOSARTAN POTASSIUM 50 MG PO TABS
100.0000 mg | ORAL_TABLET | Freq: Every day | ORAL | Status: DC
  Administered 2020-06-24 – 2020-07-08 (×15): 100 mg via ORAL
  Filled 2020-06-23 (×16): qty 2

## 2020-06-23 MED ORDER — ACETAMINOPHEN 650 MG RE SUPP: 650.0000 mg | Freq: Four times a day (QID) | RECTAL | Status: DC | PRN

## 2020-06-23 MED ORDER — ASPIRIN EC 81 MG PO TBEC
81.0000 mg | DELAYED_RELEASE_TABLET | Freq: Two times a day (BID) | ORAL | Status: DC
  Administered 2020-06-23 – 2020-07-08 (×30): 81 mg via ORAL
  Filled 2020-06-23 (×30): qty 1

## 2020-06-23 MED ORDER — HYDROCODONE-ACETAMINOPHEN 5-325 MG PO TABS
1.0000 | ORAL_TABLET | ORAL | Status: DC | PRN
  Administered 2020-06-26: 2 via ORAL
  Administered 2020-06-27 (×3): 1 via ORAL
  Administered 2020-06-28 – 2020-06-30 (×7): 2 via ORAL
  Administered 2020-07-01: 1 via ORAL
  Administered 2020-07-01 (×2): 2 via ORAL
  Administered 2020-07-02: 1 via ORAL
  Administered 2020-07-02: 2 via ORAL
  Administered 2020-07-02 – 2020-07-04 (×9): 1 via ORAL
  Administered 2020-07-05 (×4): 2 via ORAL
  Administered 2020-07-05: 1 via ORAL
  Administered 2020-07-06 – 2020-07-08 (×12): 2 via ORAL
  Filled 2020-06-23 (×4): qty 2
  Filled 2020-06-23: qty 1
  Filled 2020-06-23: qty 2
  Filled 2020-06-23: qty 1
  Filled 2020-06-23: qty 2
  Filled 2020-06-23: qty 1
  Filled 2020-06-23: qty 2
  Filled 2020-06-23: qty 1
  Filled 2020-06-23 (×2): qty 2
  Filled 2020-06-23: qty 1
  Filled 2020-06-23: qty 2
  Filled 2020-06-23: qty 1
  Filled 2020-06-23 (×5): qty 2
  Filled 2020-06-23: qty 1
  Filled 2020-06-23 (×4): qty 2
  Filled 2020-06-23: qty 1
  Filled 2020-06-23 (×3): qty 2
  Filled 2020-06-23: qty 1
  Filled 2020-06-23 (×2): qty 2
  Filled 2020-06-23: qty 1
  Filled 2020-06-23 (×4): qty 2
  Filled 2020-06-23 (×2): qty 1
  Filled 2020-06-23: qty 2
  Filled 2020-06-23: qty 1

## 2020-06-23 MED ORDER — DIVALPROEX SODIUM ER 500 MG PO TB24
750.0000 mg | ORAL_TABLET | Freq: Every day | ORAL | Status: DC
  Administered 2020-06-23 – 2020-07-01 (×9): 750 mg via ORAL
  Filled 2020-06-23 (×12): qty 1

## 2020-06-23 MED ORDER — ZOLPIDEM TARTRATE 5 MG PO TABS
5.0000 mg | ORAL_TABLET | Freq: Every evening | ORAL | Status: DC | PRN
  Administered 2020-07-08: 5 mg via ORAL
  Filled 2020-06-23: qty 1

## 2020-06-23 NOTE — Telephone Encounter (Signed)
Received a call from radiologist Dr. Carlis Abbott, MRI of the brain showed large subacute bilateral subdural hematoma  We will complete MRI of the cervical, and thoracic spine to clarify the etiology for the initial fall on June 03, 2020, since then, she had fell multiple times, she will be referred to [facility] emergency room for neurosurgical evaluation afterwards.

## 2020-06-23 NOTE — Telephone Encounter (Addendum)
Called and spoke with pt and husband. She saw PCP after we spoke yesterday who recommended she not wait until next Friday to see Dr. Jannifer Franklin. Feels she needs to be seen this week. She is wondering if another physician can see her this week or if she needs to go to the ER. Advised if she feels sx are severe, she should proceed to ER. She wanted [place] to admit her. Advised that we do not have admitting rights but can sometimes call ahead to give ER heads up that she is coming. She wants to to see if we can fit her in this week for an appt first before going to ER. Advised I will need to discuss with MD's here and I will call her back. She verbalized understanding.   I spoke with Dr. Krista Blue and she is agreeable to see patient for urgent work in/evaluation. Pt agreed to appt at 3pm, check in 230pm. I placed on schedule. She is bringing friend that is a PA with her. They denied any sx of covid or being exposed to anyone with covid-19. They will wear masks. Advised we have WC in office for her to use. They are by check in.

## 2020-06-23 NOTE — Telephone Encounter (Signed)
FYI  Patient is scheduled at [facility] for 5 pm this evening. They have your cell phone number and will call you with the report.  Medicare/aarp no auth.

## 2020-06-23 NOTE — H&P (Signed)
Amanda Collier is an 74 y.o. female.   Chief Complaint: Subdural hematoma, multiple falls, gait difficulties HPI: Amanda Collier is a 74 y.o. female Whom over the last three weeks has had increasing difficulty walking. She has fallen 7 times during this period. She has used a walker during the last week. Sent to the ED from [facility] imaging due to Bilateral subdural hematomas found on today's MRI. No mental changes.   Past Medical History:  Diagnosis Date  . Headache syndrome 12/18/2016  . High blood pressure   . Hypertension    followed by Dr Gillian Shields medications  . Leg swelling    Hx of    Past Surgical History:  Procedure Laterality Date  . NO PAST SURGERIES      Family History  Adopted: Yes  Problem Relation Age of Onset  . Heart attack Maternal Uncle        ? unknown if correct/patient adopted   Social History:  reports that she has never smoked. She has never used smokeless tobacco. She reports current alcohol use of about 7.0 standard drinks of alcohol per week. She reports that she does not use drugs.  Allergies:  Allergies  Allergen Reactions  . Codeine Nausea Only  . Lexapro [Escitalopram Oxalate] Other (See Comments)    GI upset  . Lisinopril Cough  . Lotensin [Benazepril Hcl] Other (See Comments)    Headaches    (Not in a hospital admission)   No results found for this or any previous visit (from the past 48 hour(s)). MR BRAIN WO CONTRAST  Result Date: 06/23/2020 CLINICAL DATA:  Fall 06/03/2020. Gait abnormality. Neuro deficit acute. EXAM: MRI HEAD WITHOUT CONTRAST TECHNIQUE: Multiplanar, multiecho pulse sequences of the brain and surrounding structures were obtained without intravenous contrast. COMPARISON:  MRI head 01/30/2019 FINDINGS: Brain: Large subdural hematomas are present bilaterally measuring approximately 18 mm in thickness on the right and 19 mm in thickness on the left. These appear to be subacute subdural hematomas with  methemoglobin present throughout the fluid collection bilaterally. There is compression of the underlying brain without significant brain edema. No midline shift. Ventricle size is normal. No acute infarct. Patchy white matter hyperintensity bilaterally compatible with mild chronic microvascular ischemic change similar to the prior MRI. Negative for mass lesion. Vascular: Normal arterial flow voids Skull and upper cervical spine: No focal skeletal lesion. Sinuses/Orbits: Paranasal sinuses clear.  Negative orbit. Other: None IMPRESSION: Large subacute subdural hematomas bilaterally. There is significant mass-effect on both cerebral hemispheres without midline shift. Negative for acute infarct. Mild chronic microvascular ischemic changes in the white matter. These results were called by telephone at the time of interpretation on 06/23/2020 at 6:52 pm to provider Marcial Pacas , who verbally acknowledged these results. Dr. Krista Blue requested completion of the MRI of the cervical and thoracic spine and transfer of the patient to [facility] emergency room for neuro surgical evaluation. Electronically Signed   By: Franchot Gallo M.D.   On: 06/23/2020 18:53   MR CERVICAL SPINE WO CONTRAST  Result Date: 06/23/2020 CLINICAL DATA:  Several falls over the last 3 weeks. Ataxia. Bilateral subdural hematoma. EXAM: MRI CERVICAL AND THORACIC SPINE WITHOUT CONTRAST TECHNIQUE: Multiplanar and multiecho pulse sequences of the cervical spine, to include the craniocervical junction and cervicothoracic junction, and the thoracic spine, were obtained without intravenous contrast. COMPARISON:  None. FINDINGS: MRI CERVICAL SPINE FINDINGS Alignment: Slight retrolisthesis C4-5 otherwise normal alignment. Vertebrae: Negative for fracture or mass. Cord: Normal signal and  morphology Posterior Fossa, vertebral arteries, paraspinal tissues: Negative for mass or hematoma Disc levels: C2-3: Negative C3-4: Tiny central disc protrusion. Negative  for spinal or foraminal stenosis C4-5: Mild retrolisthesis. Bilateral uncinate spurring right greater than left. Cord flattening and mild to moderate spinal stenosis right greater than left. Moderate foraminal stenosis bilaterally due to spurring. Mild facet degeneration bilaterally. C5-6: Small central disc protrusion with mild spinal stenosis. Neural foramina patent bilaterally C6-7: Small central disc protrusion. Neural foramina patent bilaterally C7-T1: Negative MRI THORACIC SPINE FINDINGS Alignment:  Normal Vertebrae: Negative for fracture or mass. Mild edema to the right of T8-9 appears to be related to costovertebral degenerative change. Cord:  Normal signal morphology.  No cord compression. Paraspinal and other soft tissues: No paraspinous mass or hematoma. No pleural effusion. Disc levels: Mild disc degeneration in the thoracic spine at multiple levels with disc space narrowing and disc desiccation. No significant spinal stenosis. Mild spurring on the right at T3-4. Mild spurring on the right at T6-7. No focal disc protrusion. IMPRESSION: 1. Negative for cervical spine fracture. 2. Mild to moderate degenerative change cervical spine. Spinal stenosis right greater left at C4-5 due to spurring. Moderate foraminal stenosis bilaterally. Mild spinal stenosis C5-6. 3. Negative for thoracic spine fracture. Mild degenerative change. No cord compression. Electronically Signed   By: Franchot Gallo M.D.   On: 06/23/2020 19:04   MR THORACIC SPINE WO CONTRAST  Result Date: 06/23/2020 CLINICAL DATA:  Several falls over the last 3 weeks. Ataxia. Bilateral subdural hematoma. EXAM: MRI CERVICAL AND THORACIC SPINE WITHOUT CONTRAST TECHNIQUE: Multiplanar and multiecho pulse sequences of the cervical spine, to include the craniocervical junction and cervicothoracic junction, and the thoracic spine, were obtained without intravenous contrast. COMPARISON:  None. FINDINGS: MRI CERVICAL SPINE FINDINGS Alignment: Slight  retrolisthesis C4-5 otherwise normal alignment. Vertebrae: Negative for fracture or mass. Cord: Normal signal and morphology Posterior Fossa, vertebral arteries, paraspinal tissues: Negative for mass or hematoma Disc levels: C2-3: Negative C3-4: Tiny central disc protrusion. Negative for spinal or foraminal stenosis C4-5: Mild retrolisthesis. Bilateral uncinate spurring right greater than left. Cord flattening and mild to moderate spinal stenosis right greater than left. Moderate foraminal stenosis bilaterally due to spurring. Mild facet degeneration bilaterally. C5-6: Small central disc protrusion with mild spinal stenosis. Neural foramina patent bilaterally C6-7: Small central disc protrusion. Neural foramina patent bilaterally C7-T1: Negative MRI THORACIC SPINE FINDINGS Alignment:  Normal Vertebrae: Negative for fracture or mass. Mild edema to the right of T8-9 appears to be related to costovertebral degenerative change. Cord:  Normal signal morphology.  No cord compression. Paraspinal and other soft tissues: No paraspinous mass or hematoma. No pleural effusion. Disc levels: Mild disc degeneration in the thoracic spine at multiple levels with disc space narrowing and disc desiccation. No significant spinal stenosis. Mild spurring on the right at T3-4. Mild spurring on the right at T6-7. No focal disc protrusion. IMPRESSION: 1. Negative for cervical spine fracture. 2. Mild to moderate degenerative change cervical spine. Spinal stenosis right greater left at C4-5 due to spurring. Moderate foraminal stenosis bilaterally. Mild spinal stenosis C5-6. 3. Negative for thoracic spine fracture. Mild degenerative change. No cord compression. Electronically Signed   By: Franchot Gallo M.D.   On: 06/23/2020 19:04    Review of Systems  Constitutional: Positive for activity change.  HENT: Negative.   Eyes: Negative.   Respiratory: Negative.   Cardiovascular: Negative.   Gastrointestinal: Negative.   Endocrine:  Negative.   Genitourinary: Negative.   Musculoskeletal: Positive for  gait problem.  Skin: Negative.   Neurological: Positive for weakness.  Hematological: Negative.   Psychiatric/Behavioral: Negative.     Blood pressure 139/80, pulse 64, temperature 97.9 F (36.6 C), resp. rate 19, weight 74.8 kg, last menstrual period 11/14/2003, SpO2 100 %. Physical Exam Constitutional:      Appearance: Normal appearance. She is normal weight.  HENT:     Head: Normocephalic and atraumatic.     Right Ear: Tympanic membrane normal.     Left Ear: Tympanic membrane normal.     Nose: Nose normal.     Mouth/Throat:     Mouth: Mucous membranes are moist.     Pharynx: Oropharynx is clear.  Eyes:     Extraocular Movements: Extraocular movements intact.     Conjunctiva/sclera: Conjunctivae normal.     Pupils: Pupils are equal, round, and reactive to light.  Cardiovascular:     Rate and Rhythm: Normal rate and regular rhythm.     Pulses: Normal pulses.     Heart sounds: Normal heart sounds.  Pulmonary:     Effort: Pulmonary effort is normal.  Abdominal:     General: Abdomen is flat.     Palpations: Abdomen is soft.  Musculoskeletal:        General: Normal range of motion.     Cervical back: Normal range of motion and neck supple.  Skin:    General: Skin is warm and dry.  Neurological:     General: No focal deficit present.     Mental Status: She is alert and oriented to person, place, and time.     Cranial Nerves: No cranial nerve deficit.     Motor: No weakness.     Deep Tendon Reflexes:     Reflex Scores:      Tricep reflexes are 3+ on the right side and 3+ on the left side.      Bicep reflexes are 3+ on the right side and 3+ on the left side.      Brachioradialis reflexes are 3+ on the right side and 3+ on the left side.      Patellar reflexes are 2+ on the right side and 2+ on the left side.      Achilles reflexes are 2+ on the right side and 2+ on the left side.    Comments: Gait not  assessed  Psychiatric:        Mood and Affect: Mood normal.        Behavior: Behavior normal.        Thought Content: Thought content normal.        Judgment: Judgment normal.      Assessment/Plan Admit for probable operative evacuation of the bilateral subdural hematomas tomorrow. Risks and benefits fully explained including recurrence, brain damage, no improvement in gait, need for repeat surgery, seizures. Mrs. Fromer understands and wishes to proceed.   Ashok Pall, MD 06/23/2020, 9:23 PM

## 2020-06-23 NOTE — ED Notes (Signed)
Pt reports unsteady gait x 3 weeks and 7 falls during that time. Pt also reports weakness and that she has been using a walker for 1 week.

## 2020-06-23 NOTE — ED Triage Notes (Signed)
Pt sent here by provider for abnormal MRI (subdural); per patient.

## 2020-06-23 NOTE — Telephone Encounter (Signed)
Pt called back and would like the RN to call her back since she was not able to talk yesterday due to going to her PCP appt.

## 2020-06-23 NOTE — Progress Notes (Signed)
HISTORICAL  Amanda Collier is a 74 year old female, seen in request by urgent care for from her primary care physician Dr. Maurice Small for evaluation of acute onset gait abnormality  I reviewed and summarized the referring note.  Past medical history Hypertension Diabetes, 2014 Chronic migraine headaches Depression, Depakote 250mg  tid  Chronic insomnia.  She has been a patient of [facility] in the past few years, saw Dr. Jannifer Franklin, and nurse practitioner Judson Roch, in most recent office visit in May 2021, her migraine headache was under better control, there was no significant gait abnormality documented.  She was highly function, went to [place] her summer house over the past few months, she described needing some help getting up and down to the boat, which is at her baseline, on June 03, 2020, she was traveling alone, flew back from [place] to [place], while rolling her carry on luggage, without clear triggers, she fell in the airport, could not get up from the floor, there was no loss of conscious, no significant injury, she has to be rolled into the airplane by wheelchair, during the airflight, she was able to using bathroom holding on airplane seat,  She has to be ruled out of the airplane by wheelchair, was picked up by her friend at airport, needing help getting in and out of the car, ever since then, she had a gradual worsening gait abnormality, fell multiple times, came in with wheelchair today, she can no longer be independent in her daily activity, she already had long history of urinary urgency, no noticed worsening urinary urgency, occasionally incontinence, wear depends, has chronic constipation,  She denies bilateral upper extremity weakness or sensory loss, denies lower extremity sensory loss, she was seen by orthopedic surgeon Dr. Louanne Skye, extra recently, x-ray of bilateral ankles showed right ankle nondisplaced distal fibula fracture, nondisplaced left distal left  fibular fracture, but she denies significant pain,   REVIEW OF SYSTEMS: Full 14 system review of systems performed and notable only for as above All other review of systems were negative.  ALLERGIES: Allergies  Allergen Reactions  . Codeine     nausea  . Lexapro [Escitalopram Oxalate]     GI upset  . Lisinopril     Cough   . Lotensin [Benazepril Hcl] Other (See Comments)    headaches headaches    HOME MEDICATIONS: Current Outpatient Medications  Medication Sig Dispense Refill  . clonazePAM (KLONOPIN) 0.5 MG tablet Take 0.5 mg by mouth as needed for anxiety.    Mariane Baumgarten Sodium (COLACE PO) Take 1 tablet by mouth at bedtime.    . hydrochlorothiazide (HYDRODIURIL) 25 MG tablet Take 25 mg by mouth daily.    Marland Kitchen losartan (COZAAR) 100 MG tablet Take 1 tablet by mouth daily.  1  . metFORMIN (GLUCOPHAGE) 500 MG tablet Take 500 mg by mouth daily with breakfast.    . Naproxen Sodium (ALEVE) 220 MG CAPS Take by mouth daily.    Marland Kitchen topiramate (TOPAMAX) 50 MG tablet Take 1.5 tablets (75 mg total) by mouth at bedtime. 120 tablet 0  . Vitamin D, Ergocalciferol, (DRISDOL) 50000 UNITS CAPS Take 50,000 Units by mouth every 7 (seven) days.    Marland Kitchen zolpidem (AMBIEN) 5 MG tablet Take 10 mg by mouth at bedtime as needed for sleep.    . Divalproex Sodium (DEPAKOTE PO) Take 250 mg by mouth 3 (three) times daily. Can take up to three times daily  (Patient not taking: Reported on 06/23/2020)     No  current facility-administered medications for this visit.    PAST MEDICAL HISTORY: Past Medical History:  Diagnosis Date  . Headache syndrome 12/18/2016  . High blood pressure   . Hypertension    followed by Dr Gillian Shields medications  . Leg swelling    Hx of    PAST SURGICAL HISTORY: Past Surgical History:  Procedure Laterality Date  . NO PAST SURGERIES      FAMILY HISTORY: Family History  Adopted: Yes  Problem Relation Age of Onset  . Heart attack Maternal Uncle        ? unknown if  correct/patient adopted    SOCIAL HISTORY: Social History   Socioeconomic History  . Marital status: Married    Spouse name: Khylah Kendra  . Number of children: 2  . Years of education: Teacher, English as a foreign language, almost masters  . Highest education level: Not on file  Occupational History  . Not on file  Tobacco Use  . Smoking status: Never Smoker  . Smokeless tobacco: Never Used  Vaping Use  . Vaping Use: Never used  Substance and Sexual Activity  . Alcohol use: Yes    Alcohol/week: 7.0 standard drinks    Types: 7 Standard drinks or equivalent per week  . Drug use: No  . Sexual activity: Yes    Partners: Male    Birth control/protection: Other-see comments, Post-menopausal    Comment: vasectomy  Other Topics Concern  . Not on file  Social History Narrative   Lives with husband   Caffeine use: Drinks mocha every couple days   Tea- daily   Social Determinants of Health   Financial Resource Strain:   . Difficulty of Paying Living Expenses:   Food Insecurity:   . Worried About Charity fundraiser in the Last Year:   . Arboriculturist in the Last Year:   Transportation Needs:   . Film/video editor (Medical):   Marland Kitchen Lack of Transportation (Non-Medical):   Physical Activity:   . Days of Exercise per Week:   . Minutes of Exercise per Session:   Stress:   . Feeling of Stress :   Social Connections:   . Frequency of Communication with Friends and Family:   . Frequency of Social Gatherings with Friends and Family:   . Attends Religious Services:   . Active Member of Clubs or Organizations:   . Attends Archivist Meetings:   Marland Kitchen Marital Status:   Intimate Partner Violence:   . Fear of Current or Ex-Partner:   . Emotionally Abused:   Marland Kitchen Physically Abused:   . Sexually Abused:      PHYSICAL EXAM   Vitals:   06/23/20 1500  BP: 128/70  Pulse: 73  SpO2: 95%   Not recorded     There is no height or weight on file to calculate BMI.  PHYSICAL  EXAMNIATION:  Gen: NAD, conversant, well nourised, well groomed                     Cardiovascular: Regular rate rhythm, no peripheral edema, warm, nontender. Eyes: Conjunctivae clear without exudates or hemorrhage Neck: Supple, no carotid bruits. Pulmonary: Clear to auscultation bilaterally   NEUROLOGICAL EXAM:  MENTAL STATUS: Speech:    Speech is normal; fluent and spontaneous with normal comprehension.  Cognition:     Orientation to time, place and person     Normal recent and remote memory     Normal Attention span and concentration  Normal Language, naming, repeating,spontaneous speech     Fund of knowledge   CRANIAL NERVES: CN II: Visual fields are full to confrontation. Pupils are round equal and briskly reactive to light. CN III, IV, VI: extraocular movement are normal. No ptosis. CN V: Facial sensation is intact to light touch CN VII: Face is symmetric with normal eye closure  CN VIII: Hearing is normal to causal conversation. CN IX, X: Phonation is normal. CN XI: Head turning and shoulder shrug are intact  MOTOR: Right lateral arm bruise from recent fall, upper extremity muscle tone was normal, normal strength, mild spasticity of bilateral lower extremity, there was no significant bilateral lower extremity proximal or distal weakness on examination  REFLEXES: Reflexes are 2+ and symmetric at the biceps, triceps, 3/3 knees, and 2/2 ankles. Plantar responses are extensor bilaterally  SENSORY: Intact to light touch, pinprick and vibratory sensation are intact in fingers and toes.  COORDINATION: There is no trunk or limb dysmetria noted.  GAIT/STANCE: She rely on walker to get up from seated position, stiff, wide-based and cautious gait   DIAGNOSTIC DATA (LABS, IMAGING, TESTING) - I reviewed patient records, labs, notes, testing and imaging myself where available.   ASSESSMENT AND PLAN  Amanda Collier is a 74 y.o. female   Acute onset gait abnormality,  multiple falls since June 03, 2020  Hyperreflexia especially bilateral lower extremity, bilateral Babinski signs, worsening urinary urgency, incontinence  Differentiation diagnosis include cervical myelopathy, versus thoracic myelopathy, parasagittal bilateral frontal area pathology  Stat MRI of the brain, cervical, thoracic spine    Marcial Pacas, M.D. Ph.D.  [facility] [address], [place], [place] [address] Ph: [phone] Fax: [phone]  CC:  Maurice Small, MD [address] [place],  [place] [address]  Maurice Small, MD

## 2020-06-23 NOTE — Progress Notes (Signed)
S/p several falls over the last 3 days. Spoke with Dr Carlis Abbott pt to go to [facility] after MRI to be evaluated by Neurosurgery. Called Dy Krista Blue. Dr Krista Blue to call [facility] to make them aware of her arrival.   Pt underwent MRI Brain/C-spine/T-spine without contrast

## 2020-06-23 NOTE — ED Provider Notes (Signed)
[facility] EMERGENCY DEPARTMENT Provider Note   CSN: [id number] Arrival date & time: 06/23/20  1906     History Chief Complaint  Patient presents with  . Abnormal MRI    Amanda Collier is a 74 y.o. female.  74 y.o female with a PMH of HTN, Borderline Diabetes presents to the ED sent in by neurology Dr. Ronny Flurry. Patient was evaluated by Dr. Ronny Flurry in office due to recurrent falls in the past 2 weeks. She had an outpatient MRI of Brain, cervical spine and thoracic spine with abnormal results, she was called in by radiology to return to the Emergency department. Her last fall was about a week ago, occurring when she tried to use her walker to ambulate to the bathroom and "feet went from under me". She reports not hitting her head on any of the episodes, states "this is why my arms are all bruised, I caught myself". She reports having "to think before I begin walking, I have to think right left foot before". She is currently ambulatory at baseline with a walker. She does have a history of migraine on topomax, no headache on today's visit. She denies any blood thinner use, last intake around noon today. No nausea, vomiting, dizziness or other complaints.  The history is provided by the patient and medical records.       Past Medical History:  Diagnosis Date  . Headache syndrome 12/18/2016  . High blood pressure   . Hypertension    followed by Dr Gillian Shields medications  . Leg swelling    Hx of    Patient Active Problem List   Diagnosis Date Noted  . Fall 06/23/2020  . Gait abnormality 06/23/2020  . 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 Headache syndrome 12/18/2016  . Essential hypertension 12/17/2015  . Elevated lipids 12/17/2015  . Borderline diabetes 12/17/2015    Past Surgical History:  Procedure Laterality Date  . NO PAST SURGERIES       OB History    Gravida  2   Para  2   Term      Preterm      AB      Living  2     SAB        TAB      Ectopic      Multiple      Live Births              Family History  Adopted: Yes  Problem Relation Age of Onset  . Heart attack Maternal Uncle        ? unknown if correct/patient adopted    Social History   Tobacco Use  . Smoking status: Never Smoker  . Smokeless tobacco: Never Used  Vaping Use  . Vaping Use: Never used  Substance Use Topics  . Alcohol use: Yes    Alcohol/week: 7.0 standard drinks    Types: 7 Standard drinks or equivalent per week  . Drug use: No    Home Medications Prior to Admission medications   Medication Sig Start Date End Date Taking? Authorizing Provider  acetaminophen (TYLENOL) 500 MG tablet Take 1,000 mg by mouth every 6 (six) hours as needed (for pain).   Yes [provider]  aspirin 81 MG EC tablet Take 81 mg by mouth in the morning and at bedtime. Swallow whole.   Yes [provider]  atorvastatin (LIPITOR) 10 MG tablet Take 10 mg by mouth daily.   Yes [provider]  clonazePAM (KLONOPIN) 0.5 MG tablet Take 0.5 mg by mouth at bedtime as needed (for sleep).    Yes [provider]  divalproex (DEPAKOTE ER) 250 MG 24 hr tablet Take 250-750 mg by mouth See admin instructions. Take 250 mg by mouth in the morning and 750 mg at bedtime   Yes [provider]  losartan (COZAAR) 100 MG tablet Take 100 mg by mouth in the morning.  08/07/17  Yes [provider]  metFORMIN (GLUCOPHAGE-XR) 500 MG 24 hr tablet Take 500 mg by mouth daily before supper.   Yes [provider]  topiramate (TOPAMAX) 50 MG tablet Take 1.5 tablets (75 mg total) by mouth at bedtime. 05/27/20  Yes Suzzanne Cloud, NP  Vitamin D, Ergocalciferol, (DRISDOL) 50000 UNITS CAPS Take 50,000 Units by mouth every Wednesday.    Yes [provider]  zolpidem (AMBIEN) 10 MG tablet Take 10 mg by mouth at bedtime as needed for sleep.   Yes [provider]  Divalproex Sodium (DEPAKOTE PO) Take 250 mg by  mouth 3 (three) times daily. Can take up to three times daily  Patient not taking: Reported on 06/23/2020    [provider]  Docusate Sodium (COLACE PO) Take 1 tablet by mouth at bedtime. Patient not taking: Reported on 06/23/2020    [provider]  hydrochlorothiazide (HYDRODIURIL) 25 MG tablet Take 25 mg by mouth daily.    [provider]  Naproxen Sodium (ALEVE) 220 MG CAPS Take by mouth daily. Patient not taking: Reported on 06/23/2020    [provider]    Allergies    Codeine, Lexapro [escitalopram oxalate], Lisinopril, and Lotensin [benazepril hcl]  Review of Systems   Review of Systems  Constitutional: Negative for chills and fever.  HENT: Negative for sore throat.   Respiratory: Negative for shortness of breath.   Gastrointestinal: Negative for abdominal pain, nausea and vomiting.  Genitourinary: Negative for flank pain.  Musculoskeletal: Positive for gait problem. Negative for back pain.  Skin: Negative for pallor and wound.  Neurological: Negative for syncope, light-headedness and headaches.  All other systems reviewed and are negative.   Physical Exam Updated Vital Signs BP (!) 142/76   Pulse 61   Temp 97.9 F (36.6 C)   Resp 17   Wt 74.8 kg   LMP 11/14/2003   SpO2 98%   BMI 28.32 kg/m   Physical Exam Vitals and nursing note reviewed.  Constitutional:      Appearance: Normal appearance.  HENT:     Head: Normocephalic and atraumatic.     Nose: Nose normal.     Mouth/Throat:     Mouth: Mucous membranes are moist.  Eyes:     Extraocular Movements: Extraocular movements intact.     Pupils: Pupils are equal, round, and reactive to light.     Comments: Pupils are equal and reactive.   Cardiovascular:     Rate and Rhythm: Normal rate.     Pulses: Normal pulses.  Pulmonary:     Effort: Pulmonary effort is normal.     Breath sounds: No wheezing.  Abdominal:     General: Abdomen is flat.     Tenderness: There is no  abdominal tenderness. There is no right CVA tenderness or left CVA tenderness.  Musculoskeletal:        General: No tenderness or deformity. Normal range of motion.     Cervical back: Normal range of motion and neck supple.  Skin:  General: Skin is warm and dry.  Neurological:     Mental Status: She is alert and oriented to person, place, and time.     Sensory: No sensory deficit.     Motor: No weakness.     Comments: Alert, oriented, thought content appropriate. Speech fluent without evidence of aphasia. Able to follow 2 step commands without difficulty.  Cranial Nerves:  II:  Peripheral visual fields grossly normal, pupils, round, reactive to light III,IV, VI: ptosis not present, extra-ocular motions intact bilaterally  V,VII: smile symmetric, facial light touch sensation equal VIII: hearing grossly normal bilaterally  IX,X: midline uvula rise  XI: bilateral shoulder shrug equal and strong XII: midline tongue extension  Motor:  5/5 in upper and lower extremities bilaterally including strong and equal grip strength and dorsiflexion/plantar flexion Sensory: light touch normal in all extremities.  Cerebellar: normal finger-to-nose with bilateral upper extremities, pronator drift negative      ED Results / Procedures / Treatments   Labs (all labs ordered are listed, but only abnormal results are displayed) Labs Reviewed  SARS CORONAVIRUS 2 BY RT PCR (HOSPITAL ORDER, [place] [facility])  CBC WITH DIFFERENTIAL/PLATELET  COMPREHENSIVE METABOLIC PANEL  APTT  PROTIME-INR    EKG None  Radiology MR BRAIN WO CONTRAST  Result Date: 06/23/2020 CLINICAL DATA:  Fall 06/03/2020. Gait abnormality. Neuro deficit acute. EXAM: MRI HEAD WITHOUT CONTRAST TECHNIQUE: Multiplanar, multiecho pulse sequences of the brain and surrounding structures were obtained without intravenous contrast. COMPARISON:  MRI head 01/30/2019 FINDINGS: Brain: Large subdural hematomas are present  bilaterally measuring approximately 18 mm in thickness on the right and 19 mm in thickness on the left. These appear to be subacute subdural hematomas with methemoglobin present throughout the fluid collection bilaterally. There is compression of the underlying brain without significant brain edema. No midline shift. Ventricle size is normal. No acute infarct. Patchy white matter hyperintensity bilaterally compatible with mild chronic microvascular ischemic change similar to the prior MRI. Negative for mass lesion. Vascular: Normal arterial flow voids Skull and upper cervical spine: No focal skeletal lesion. Sinuses/Orbits: Paranasal sinuses clear.  Negative orbit. Other: None IMPRESSION: Large subacute subdural hematomas bilaterally. There is significant mass-effect on both cerebral hemispheres without midline shift. Negative for acute infarct. Mild chronic microvascular ischemic changes in the white matter. These results were called by telephone at the time of interpretation on 06/23/2020 at 6:52 pm to provider Marcial Pacas , who verbally acknowledged these results. Dr. Krista Blue requested completion of the MRI of the cervical and thoracic spine and transfer of the patient to [facility] emergency room for neuro surgical evaluation. Electronically Signed   By: Franchot Gallo M.D.   On: 06/23/2020 18:53   MR CERVICAL SPINE WO CONTRAST  Result Date: 06/23/2020 CLINICAL DATA:  Several falls over the last 3 weeks. Ataxia. Bilateral subdural hematoma. EXAM: MRI CERVICAL AND THORACIC SPINE WITHOUT CONTRAST TECHNIQUE: Multiplanar and multiecho pulse sequences of the cervical spine, to include the craniocervical junction and cervicothoracic junction, and the thoracic spine, were obtained without intravenous contrast. COMPARISON:  None. FINDINGS: MRI CERVICAL SPINE FINDINGS Alignment: Slight retrolisthesis C4-5 otherwise normal alignment. Vertebrae: Negative for fracture or mass. Cord: Normal signal and morphology  Posterior Fossa, vertebral arteries, paraspinal tissues: Negative for mass or hematoma Disc levels: C2-3: Negative C3-4: Tiny central disc protrusion. Negative for spinal or foraminal stenosis C4-5: Mild retrolisthesis. Bilateral uncinate spurring right greater than left. Cord flattening and mild to moderate spinal stenosis right greater than left.  Moderate foraminal stenosis bilaterally due to spurring. Mild facet degeneration bilaterally. C5-6: Small central disc protrusion with mild spinal stenosis. Neural foramina patent bilaterally C6-7: Small central disc protrusion. Neural foramina patent bilaterally C7-T1: Negative MRI THORACIC SPINE FINDINGS Alignment:  Normal Vertebrae: Negative for fracture or mass. Mild edema to the right of T8-9 appears to be related to costovertebral degenerative change. Cord:  Normal signal morphology.  No cord compression. Paraspinal and other soft tissues: No paraspinous mass or hematoma. No pleural effusion. Disc levels: Mild disc degeneration in the thoracic spine at multiple levels with disc space narrowing and disc desiccation. No significant spinal stenosis. Mild spurring on the right at T3-4. Mild spurring on the right at T6-7. No focal disc protrusion. IMPRESSION: 1. Negative for cervical spine fracture. 2. Mild to moderate degenerative change cervical spine. Spinal stenosis right greater left at C4-5 due to spurring. Moderate foraminal stenosis bilaterally. Mild spinal stenosis C5-6. 3. Negative for thoracic spine fracture. Mild degenerative change. No cord compression. Electronically Signed   By: Franchot Gallo M.D.   On: 06/23/2020 19:04   MR THORACIC SPINE WO CONTRAST  Result Date: 06/23/2020 CLINICAL DATA:  Several falls over the last 3 weeks. Ataxia. Bilateral subdural hematoma. EXAM: MRI CERVICAL AND THORACIC SPINE WITHOUT CONTRAST TECHNIQUE: Multiplanar and multiecho pulse sequences of the cervical spine, to include the craniocervical junction and cervicothoracic  junction, and the thoracic spine, were obtained without intravenous contrast. COMPARISON:  None. FINDINGS: MRI CERVICAL SPINE FINDINGS Alignment: Slight retrolisthesis C4-5 otherwise normal alignment. Vertebrae: Negative for fracture or mass. Cord: Normal signal and morphology Posterior Fossa, vertebral arteries, paraspinal tissues: Negative for mass or hematoma Disc levels: C2-3: Negative C3-4: Tiny central disc protrusion. Negative for spinal or foraminal stenosis C4-5: Mild retrolisthesis. Bilateral uncinate spurring right greater than left. Cord flattening and mild to moderate spinal stenosis right greater than left. Moderate foraminal stenosis bilaterally due to spurring. Mild facet degeneration bilaterally. C5-6: Small central disc protrusion with mild spinal stenosis. Neural foramina patent bilaterally C6-7: Small central disc protrusion. Neural foramina patent bilaterally C7-T1: Negative MRI THORACIC SPINE FINDINGS Alignment:  Normal Vertebrae: Negative for fracture or mass. Mild edema to the right of T8-9 appears to be related to costovertebral degenerative change. Cord:  Normal signal morphology.  No cord compression. Paraspinal and other soft tissues: No paraspinous mass or hematoma. No pleural effusion. Disc levels: Mild disc degeneration in the thoracic spine at multiple levels with disc space narrowing and disc desiccation. No significant spinal stenosis. Mild spurring on the right at T3-4. Mild spurring on the right at T6-7. No focal disc protrusion. IMPRESSION: 1. Negative for cervical spine fracture. 2. Mild to moderate degenerative change cervical spine. Spinal stenosis right greater left at C4-5 due to spurring. Moderate foraminal stenosis bilaterally. Mild spinal stenosis C5-6. 3. Negative for thoracic spine fracture. Mild degenerative change. No cord compression. Electronically Signed   By: Franchot Gallo M.D.   On: 06/23/2020 19:04    Procedures Procedures (including critical care  time)  Medications Ordered in ED Medications  aspirin EC tablet 81 mg (has no administration in time range)  atorvastatin (LIPITOR) tablet 10 mg (has no administration in time range)  losartan (COZAAR) tablet 100 mg (has no administration in time range)  metFORMIN (GLUCOPHAGE) tablet 500 mg (has no administration in time range)  zolpidem (AMBIEN) tablet 5 mg (has no administration in time range)  clonazePAM (KLONOPIN) tablet 0.5 mg (has no administration in time range)  divalproex (DEPAKOTE ER) 24 hr tablet  250 mg (has no administration in time range)  topiramate (TOPAMAX) tablet 75 mg (has no administration in time range)  Vitamin D (Ergocalciferol) (DRISDOL) capsule 50,000 Units (has no administration in time range)  0.9 % NaCl with KCl 20 mEq/ L  infusion (has no administration in time range)  sodium chloride flush (NS) 0.9 % injection 3 mL (3 mLs Intravenous Not Given 06/23/20 2207)  sodium chloride flush (NS) 0.9 % injection 3 mL (has no administration in time range)  0.9 %  sodium chloride infusion (has no administration in time range)  ondansetron (ZOFRAN) tablet 4 mg (has no administration in time range)    Or  ondansetron (ZOFRAN) injection 4 mg (has no administration in time range)  acetaminophen (TYLENOL) tablet 650 mg (has no administration in time range)    Or  acetaminophen (TYLENOL) suppository 650 mg (has no administration in time range)  HYDROcodone-acetaminophen (NORCO/VICODIN) 5-325 MG per tablet 1-2 tablet (has no administration in time range)  divalproex (DEPAKOTE ER) 24 hr tablet 750 mg (has no administration in time range)    ED Course  I have reviewed the triage vital signs and the nursing notes.  Pertinent labs & imaging results that were available during my care of the patient were reviewed by me and considered in my medical decision making (see chart for details).    MDM Rules/Calculators/A&P   Patient with a past medical history of hypertension,  borderline diabetes sent in by Dr. Domenica Fail of neurology for multiple falls over the last few weeks.  Patient reports ambulating with a walker at baseline, has had several encounters were "her feet fell from under her ".  She is currently ambulatory with a walker at baseline.  Evaluated this morning in office sent in for outpatient MRI of her brain, cervical spine, thoracic spine.  These were abnormal therefore she was sent into the ED for further management.  Patient is currently not on any blood thinners, denies hitting her head during these falls, denies any dizziness.  During evaluation patient is alert and oriented x4, neuro exam is benign although gait was not tested as patient was in the bed.  MRI of her brain, cervical spine, thoracic spine were remarkable for.  MRI brain: Large subacute subdural hematomas bilaterally. There is significant mass-effect on both cerebral hemispheres without midline shift.  Negative for acute infarct. Mild chronic microvascular ischemic changes in the white matter.  MRI Cervical/thoracic spine:  1. Negative for cervical spine fracture. 2. Mild to moderate degenerative change cervical spine. Spinal stenosis right greater left at C4-5 due to spurring. Moderate foraminal stenosis bilaterally. Mild spinal stenosis C5-6. 3. Negative for thoracic spine fracture. Mild degenerative change. No cord compression.   A consult was placed for neurosurgery Dr. Cyndy Freeze. My attending Dr. Darl Householder has spoken to neurosurgery who will evaluate patient in the ED.    Interpretation of his labs revealed a CBC without any leukocytosis.  The rest of her labs are currently pending.  Patient was evaluated by Dr. Cyndy Freeze, please see his note for full plan.  Patient is admitted under the neurosurgery service.  Dr. Malachy Moan plan:  Admit for probable operative evacuation of the bilateral subdural hematomas tomorrow. Risks and benefits fully explained including recurrence, brain damage, no  improvement in gait, need for repeat surgery, seizures. Amanda Collier understands and wishes to proceed.   Patient stable for admission.    Portions of this note were generated with [employer]. Dictation errors may occur despite best attempts  at proofreading.  Final Clinical Impression(s) / ED Diagnoses Final diagnoses:  Fall, initial encounter  Subdural hematoma [facility])  Gait difficulty    Rx / DC Orders ED Discharge Orders    None       Janeece Fitting, Hershal Coria 06/23/20 2219    Drenda Freeze, MD 06/24/20 1511

## 2020-06-24 ENCOUNTER — Encounter (HOSPITAL_COMMUNITY)
Admission: EM | Disposition: A | Discharge status: Rehabilitation Facility | Payer: Self-pay | Source: Home / Self Care | Attending: Neurosurgery

## 2020-06-24 ENCOUNTER — Inpatient Hospital Stay (HOSPITAL_COMMUNITY): Payer: Medicare Other | Admitting: Anesthesiology

## 2020-06-24 ENCOUNTER — Other Ambulatory Visit: Payer: Self-pay | Admitting: Neurosurgery

## 2020-06-24 ENCOUNTER — Encounter (HOSPITAL_COMMUNITY): Payer: Self-pay | Admitting: Neurosurgery

## 2020-06-24 DIAGNOSIS — Z9889 Other specified postprocedural states: Secondary | ICD-10-CM

## 2020-06-24 HISTORY — PX: CRANIOTOMY: SHX93

## 2020-06-24 LAB — CREATININE, SERUM
Creatinine, Ser: 0.89 mg/dL (ref 0.44–1.00)
GFR calc Af Amer: >60 mL/min (ref >60)
GFR calc non Af Amer: >60 mL/min (ref >60)

## 2020-06-24 LAB — CBC
HCT: 37.4 % (ref 36.0–46.0)
Hemoglobin: 12.3 g/dL (ref 12.0–15.0)
MCH: 33.3 pg (ref 26.0–34.0)
MCHC: 32.9 g/dL (ref 30.0–36.0)
MCV: 101.4 fL — ABNORMAL HIGH (ref 80.0–100.0)
Platelets: 271 10*3/uL (ref 150–400)
RBC: 3.69 MIL/uL — ABNORMAL LOW (ref 3.87–5.11)
RDW: 12.7 % (ref 11.5–15.5)
WBC: 4.8 10*3/uL (ref 4.0–10.5)
nRBC: 0 % (ref 0.0–0.2)

## 2020-06-24 LAB — TYPE AND SCREEN
ABO/RH(D): O POS
Antibody Screen: NEGATIVE

## 2020-06-24 LAB — GLUCOSE, CAPILLARY: Glucose-Capillary: 84 mg/dL (ref 70–99)

## 2020-06-24 LAB — ABO/RH: ABO/RH(D): O POS

## 2020-06-24 LAB — MRSA PCR SCREENING: MRSA by PCR: NEGATIVE

## 2020-06-24 SURGERY — CRANIOTOMY HEMATOMA EVACUATION SUBDURAL
Anesthesia: General | Site: Head | Laterality: Bilateral

## 2020-06-24 MED ORDER — PROPOFOL 10 MG/ML IV BOLUS
INTRAVENOUS | Status: DC | PRN
  Administered 2020-06-24: 120 mg via INTRAVENOUS

## 2020-06-24 MED ORDER — HEPARIN SODIUM (PORCINE) 5000 UNIT/ML IJ SOLN
5000.0000 [IU] | Freq: Three times a day (TID) | INTRAMUSCULAR | Status: DC
  Administered 2020-06-24 – 2020-07-08 (×41): 5000 [IU] via SUBCUTANEOUS
  Filled 2020-06-24 (×42): qty 1

## 2020-06-24 MED ORDER — MORPHINE SULFATE (PF) 2 MG/ML IV SOLN: 1.0000 mg | INTRAVENOUS | Status: DC | PRN

## 2020-06-24 MED ORDER — CEFAZOLIN SODIUM-DEXTROSE 1-4 GM/50ML-% IV SOLN
1.0000 g | Freq: Three times a day (TID) | INTRAVENOUS | Status: DC
  Administered 2020-06-24 – 2020-07-01 (×20): 1 g via INTRAVENOUS
  Filled 2020-06-24 (×20): qty 50

## 2020-06-24 MED ORDER — SODIUM CHLORIDE 0.9 % IV SOLN: INTRAVENOUS | Status: DC

## 2020-06-24 MED ORDER — ZOLPIDEM TARTRATE 5 MG PO TABS: 10.0000 mg | ORAL_TABLET | Freq: Every evening | ORAL | Status: DC | PRN

## 2020-06-24 MED ORDER — THROMBIN 20000 UNITS EX SOLR
CUTANEOUS | Status: AC
  Filled 2020-06-24: qty 20000

## 2020-06-24 MED ORDER — LABETALOL HCL 5 MG/ML IV SOLN: 10.0000 mg | INTRAVENOUS | Status: DC | PRN

## 2020-06-24 MED ORDER — LIDOCAINE-EPINEPHRINE 0.5 %-1:200000 IJ SOLN
INTRAMUSCULAR | Status: DC | PRN
  Administered 2020-06-24: 10 mL

## 2020-06-24 MED ORDER — SODIUM CHLORIDE 0.9 % IV SOLN: INTRAVENOUS | Status: DC | PRN

## 2020-06-24 MED ORDER — THROMBIN 20000 UNITS EX SOLR: CUTANEOUS | Status: DC | PRN

## 2020-06-24 MED ORDER — CHLORHEXIDINE GLUCONATE CLOTH 2 % EX PADS
6.0000 | MEDICATED_PAD | Freq: Every day | CUTANEOUS | Status: DC
  Administered 2020-06-25 – 2020-07-07 (×11): 6 via TOPICAL

## 2020-06-24 MED ORDER — ONDANSETRON HCL 4 MG/2ML IJ SOLN
INTRAMUSCULAR | Status: DC | PRN
  Administered 2020-06-24: 4 mg via INTRAVENOUS

## 2020-06-24 MED ORDER — CHLORHEXIDINE GLUCONATE 0.12 % MT SOLN: 15.0000 mL | Freq: Once | OROMUCOSAL | Status: AC

## 2020-06-24 MED ORDER — PHENYLEPHRINE HCL-NACL 10-0.9 MG/250ML-% IV SOLN
INTRAVENOUS | Status: DC | PRN
  Administered 2020-06-24: 50 ug/min via INTRAVENOUS

## 2020-06-24 MED ORDER — MEPERIDINE HCL 25 MG/ML IJ SOLN
6.2500 mg | INTRAMUSCULAR | Status: DC | PRN
Start: 2020-06-24 — End: 2020-06-24

## 2020-06-24 MED ORDER — 0.9 % SODIUM CHLORIDE (POUR BTL) OPTIME
TOPICAL | Status: DC | PRN
  Administered 2020-06-24 (×3): 1000 mL

## 2020-06-24 MED ORDER — LIDOCAINE-EPINEPHRINE 0.5 %-1:200000 IJ SOLN
INTRAMUSCULAR | Status: AC
  Filled 2020-06-24: qty 1

## 2020-06-24 MED ORDER — PHENYLEPHRINE 40 MCG/ML (10ML) SYRINGE FOR IV PUSH (FOR BLOOD PRESSURE SUPPORT)
PREFILLED_SYRINGE | INTRAVENOUS | Status: DC | PRN
  Administered 2020-06-24: 80 ug via INTRAVENOUS
  Administered 2020-06-24: 120 ug via INTRAVENOUS
  Administered 2020-06-24: 80 ug via INTRAVENOUS

## 2020-06-24 MED ORDER — FENTANYL CITRATE (PF) 100 MCG/2ML IJ SOLN
INTRAMUSCULAR | Status: DC | PRN
  Administered 2020-06-24: 150 ug via INTRAVENOUS

## 2020-06-24 MED ORDER — BACITRACIN ZINC 500 UNIT/GM EX OINT
TOPICAL_OINTMENT | CUTANEOUS | Status: AC
  Filled 2020-06-24: qty 28.35

## 2020-06-24 MED ORDER — HYDROMORPHONE HCL 1 MG/ML IJ SOLN
0.2500 mg | INTRAMUSCULAR | Status: DC | PRN
Start: 2020-06-24 — End: 2020-06-24

## 2020-06-24 MED ORDER — ROCURONIUM BROMIDE 10 MG/ML (PF) SYRINGE
PREFILLED_SYRINGE | INTRAVENOUS | Status: DC | PRN
  Administered 2020-06-24: 80 mg via INTRAVENOUS
  Administered 2020-06-24: 20 mg via INTRAVENOUS

## 2020-06-24 MED ORDER — BACITRACIN ZINC 500 UNIT/GM EX OINT
TOPICAL_OINTMENT | CUTANEOUS | Status: DC | PRN
  Administered 2020-06-24: 1 via TOPICAL

## 2020-06-24 MED ORDER — DEXAMETHASONE SODIUM PHOSPHATE 4 MG/ML IJ SOLN
INTRAMUSCULAR | Status: DC | PRN
  Administered 2020-06-24: 10 mg via INTRAVENOUS

## 2020-06-24 MED ORDER — NALOXONE HCL 0.4 MG/ML IJ SOLN: 0.0800 mg | INTRAMUSCULAR | Status: DC | PRN

## 2020-06-24 MED ORDER — LIDOCAINE 2% (20 MG/ML) 5 ML SYRINGE
INTRAMUSCULAR | Status: DC | PRN
  Administered 2020-06-24: 100 mg via INTRAVENOUS

## 2020-06-24 MED ORDER — GLYCOPYRROLATE PF 0.2 MG/ML IJ SOSY
PREFILLED_SYRINGE | INTRAMUSCULAR | Status: DC | PRN
  Administered 2020-06-24 (×2): .1 mg via INTRAVENOUS

## 2020-06-24 MED ORDER — FENTANYL CITRATE (PF) 250 MCG/5ML IJ SOLN
INTRAMUSCULAR | Status: AC
  Filled 2020-06-24: qty 5

## 2020-06-24 MED ORDER — PHENYLEPHRINE 40 MCG/ML (10ML) SYRINGE FOR IV PUSH (FOR BLOOD PRESSURE SUPPORT)
PREFILLED_SYRINGE | INTRAVENOUS | Status: AC
  Filled 2020-06-24: qty 20

## 2020-06-24 MED ORDER — ROCURONIUM BROMIDE 10 MG/ML (PF) SYRINGE
PREFILLED_SYRINGE | INTRAVENOUS | Status: AC
  Filled 2020-06-24: qty 10

## 2020-06-24 MED ORDER — PROMETHAZINE HCL 25 MG PO TABS: 12.5000 mg | ORAL_TABLET | ORAL | Status: DC | PRN

## 2020-06-24 MED ORDER — ROCURONIUM BROMIDE 10 MG/ML (PF) SYRINGE: PREFILLED_SYRINGE | INTRAVENOUS | Status: DC | PRN

## 2020-06-24 MED ORDER — ONDANSETRON HCL 4 MG/2ML IJ SOLN: 4.0000 mg | Freq: Once | INTRAMUSCULAR | Status: DC | PRN

## 2020-06-24 MED ORDER — CHLORHEXIDINE GLUCONATE CLOTH 2 % EX PADS: 6.0000 | MEDICATED_PAD | Freq: Once | CUTANEOUS | Status: DC

## 2020-06-24 MED ORDER — METFORMIN HCL ER 500 MG PO TB24
500.0000 mg | ORAL_TABLET | Freq: Every day | ORAL | Status: DC
  Administered 2020-06-24: 500 mg via ORAL
  Filled 2020-06-24 (×2): qty 1

## 2020-06-24 MED ORDER — PANTOPRAZOLE SODIUM 40 MG IV SOLR
40.0000 mg | Freq: Every day | INTRAVENOUS | Status: DC
  Administered 2020-06-24: 40 mg via INTRAVENOUS
  Filled 2020-06-24: qty 40

## 2020-06-24 MED ORDER — CEFAZOLIN SODIUM-DEXTROSE 2-4 GM/100ML-% IV SOLN
INTRAVENOUS | Status: AC
  Filled 2020-06-24: qty 100

## 2020-06-24 MED ORDER — CEFAZOLIN SODIUM-DEXTROSE 2-4 GM/100ML-% IV SOLN
2.0000 g | INTRAVENOUS | Status: AC
  Administered 2020-06-24: 2 g via INTRAVENOUS

## 2020-06-24 MED ORDER — CHLORHEXIDINE GLUCONATE 0.12 % MT SOLN
OROMUCOSAL | Status: AC
  Administered 2020-06-24: 15 mL via OROMUCOSAL
  Filled 2020-06-24: qty 15

## 2020-06-24 MED ORDER — SUGAMMADEX SODIUM 200 MG/2ML IV SOLN
INTRAVENOUS | Status: DC | PRN
Start: 2020-06-24 — End: 2020-06-24
  Administered 2020-06-24: 200 mg via INTRAVENOUS

## 2020-06-24 SURGICAL SUPPLY — 53 items
BLADE CLIPPER SURG (BLADE) ×2 IMPLANT
BUR ACORN 6.0 PRECISION (BURR) ×2 IMPLANT
BUR SPIRAL ROUTER 2.3 (BUR) ×2 IMPLANT
CANISTER SUCT 3000ML PPV (MISCELLANEOUS) ×2 IMPLANT
CARTRIDGE OIL MAESTRO DRILL (MISCELLANEOUS) ×1 IMPLANT
CATH VENTRIC 35X38 W/TROCAR LG (CATHETERS) ×2 IMPLANT
COVER BURR HOLE 7 (Orthopedic Implant) ×4 IMPLANT
DIFFUSER DRILL AIR PNEUMATIC (MISCELLANEOUS) ×2 IMPLANT
DRAPE NEUROLOGICAL W/INCISE (DRAPES) ×2 IMPLANT
DRAPE WARM FLUID 44X44 (DRAPES) ×2 IMPLANT
DRSG TELFA 3X8 NADH (GAUZE/BANDAGES/DRESSINGS) ×2 IMPLANT
DURAPREP 6ML APPLICATOR 50/CS (WOUND CARE) ×2 IMPLANT
ELECT REM PT RETURN 9FT ADLT (ELECTROSURGICAL) ×2
ELECTRODE REM PT RTRN 9FT ADLT (ELECTROSURGICAL) ×1 IMPLANT
EVACUATOR 1/8 PVC DRAIN (DRAIN) IMPLANT
EVACUATOR SILICONE 100CC (DRAIN) IMPLANT
GAUZE 4X4 16PLY RFD (DISPOSABLE) IMPLANT
GLOVE BIOGEL PI IND STRL 7.0 (GLOVE) ×3 IMPLANT
GLOVE BIOGEL PI IND STRL 7.5 (GLOVE) ×3 IMPLANT
GLOVE BIOGEL PI IND STRL 8.5 (GLOVE) ×4 IMPLANT
GLOVE BIOGEL PI INDICATOR 7.0 (GLOVE) ×3
GLOVE BIOGEL PI INDICATOR 7.5 (GLOVE) ×3
GLOVE BIOGEL PI INDICATOR 8.5 (GLOVE) ×4
GLOVE ECLIPSE 6.5 STRL STRAW (GLOVE) ×4 IMPLANT
GLOVE ECLIPSE 7.0 STRL STRAW (GLOVE) ×6 IMPLANT
GLOVE ECLIPSE 7.5 STRL STRAW (GLOVE) ×6 IMPLANT
GOWN STRL REUS W/ TWL LRG LVL3 (GOWN DISPOSABLE) ×2 IMPLANT
GOWN STRL REUS W/ TWL XL LVL3 (GOWN DISPOSABLE) ×1 IMPLANT
GOWN STRL REUS W/TWL 2XL LVL3 (GOWN DISPOSABLE) ×4 IMPLANT
GOWN STRL REUS W/TWL LRG LVL3 (GOWN DISPOSABLE) ×4
GOWN STRL REUS W/TWL XL LVL3 (GOWN DISPOSABLE) ×2
GRAFT DURAGEN MATRIX 3WX3L (Graft) ×2 IMPLANT
GRAFT DURAGEN MATRIX 3X3 SNGL (Graft) ×1 IMPLANT
KIT BASIN OR (CUSTOM PROCEDURE TRAY) ×2 IMPLANT
KIT DRAIN CSF ACCUDRAIN (MISCELLANEOUS) ×2 IMPLANT
KIT TURNOVER KIT B (KITS) ×2 IMPLANT
NEEDLE HYPO 25X1 1.5 SAFETY (NEEDLE) ×2 IMPLANT
NS IRRIG 1000ML POUR BTL (IV SOLUTION) ×6 IMPLANT
OIL CARTRIDGE MAESTRO DRILL (MISCELLANEOUS) ×2
PACK CRANIOTOMY CUSTOM (CUSTOM PROCEDURE TRAY) ×2 IMPLANT
SCREW SD AXS 1.5X3 (Screw) ×24 IMPLANT
SPONGE SURGIFOAM ABS GEL 100 (HEMOSTASIS) ×2 IMPLANT
STAPLER VISISTAT 35W (STAPLE) ×4 IMPLANT
SUT NURALON 4 0 TR CR/8 (SUTURE) ×6 IMPLANT
SUT STEEL 0 (SUTURE)
SUT STEEL 0 18XMFL TIE 17 (SUTURE) IMPLANT
SUT VIC AB 2-0 CT2 18 VCP726D (SUTURE) ×6 IMPLANT
TOWEL GREEN STERILE (TOWEL DISPOSABLE) ×2 IMPLANT
TOWEL GREEN STERILE FF (TOWEL DISPOSABLE) ×2 IMPLANT
TRAY FOLEY MTR SLVR 16FR STAT (SET/KITS/TRAYS/PACK) ×2 IMPLANT
TUBE CONNECTING 12X1/4 (SUCTIONS) ×2 IMPLANT
UNDERPAD 30X36 HEAVY ABSORB (UNDERPADS AND DIAPERS) ×2 IMPLANT
WATER STERILE IRR 1000ML POUR (IV SOLUTION) ×2 IMPLANT

## 2020-06-24 NOTE — Anesthesia Preprocedure Evaluation (Signed)
Anesthesia Evaluation  Patient identified by MRN, date of birth, ID band Patient awake    Reviewed: Allergy & Precautions, NPO status , Patient's Chart, lab work & pertinent test results  Airway Mallampati: I  TM Distance: >3 FB Neck ROM: Full    Dental   Pulmonary    Pulmonary exam normal        Cardiovascular hypertension, Pt. on medications Normal cardiovascular exam     Neuro/Psych    GI/Hepatic   Endo/Other    Renal/GU      Musculoskeletal   Abdominal   Peds  Hematology   Anesthesia Other Findings   Reproductive/Obstetrics                             Anesthesia Physical Anesthesia Plan  ASA: III  Anesthesia Plan: General   Post-op Pain Management:    Induction: Intravenous  PONV Risk Score and Plan: 3 and Midazolam, Ondansetron and Treatment may vary due to age or medical condition  Airway Management Planned: Oral ETT  Additional Equipment: Arterial line  Intra-op Plan:   Post-operative Plan: Extubation in OR  Informed Consent: I have reviewed the patients History and Physical, chart, labs and discussed the procedure including the risks, benefits and alternatives for the proposed anesthesia with the patient or authorized representative who has indicated his/her understanding and acceptance.       Plan Discussed with: CRNA and Surgeon  Anesthesia Plan Comments:         Anesthesia Quick Evaluation

## 2020-06-24 NOTE — Transfer of Care (Signed)
Immediate Anesthesia Transfer of Care Note  Patient: Amanda Collier  Procedure(s) Performed: Bilateral craniotomy for evacuation of subdural hematoma (Bilateral )  Patient Location: PACU  Anesthesia Type:General  Level of Consciousness: awake, alert  and oriented  Airway & Oxygen Therapy: Patient Spontanous Breathing  Post-op Assessment: Report given to RN and Post -op Vital signs reviewed and stable  Post vital signs: Reviewed and stable  Last Vitals:  Vitals Value Taken Time  BP 133/87 06/24/20 1725  Temp    Pulse 71 06/24/20 1729  Resp 15 06/24/20 1729  SpO2 99 % 06/24/20 1729  Vitals shown include unvalidated device data.  Last Pain:  Vitals:   06/24/20 1316  TempSrc: Oral  PainSc: 0-No pain         Complications: No complications documented.

## 2020-06-24 NOTE — Anesthesia Procedure Notes (Signed)
Procedure Name: Intubation Date/Time: 06/24/2020 3:15 PM Performed by: Candis Shine, CRNA Pre-anesthesia Checklist: Patient identified, Emergency Drugs available, Suction available and Patient being monitored Patient Re-evaluated:Patient Re-evaluated prior to induction Oxygen Delivery Method: Circle System Utilized Preoxygenation: Pre-oxygenation with 100% oxygen Induction Type: IV induction Ventilation: Mask ventilation without difficulty Laryngoscope Size: Mac and 3 Grade View: Grade II Tube type: Oral Tube size: 7.0 mm Number of attempts: 1 Airway Equipment and Method: Stylet Placement Confirmation: ETT inserted through vocal cords under direct vision,  positive ETCO2 and breath sounds checked- equal and bilateral Secured at: 20 cm Tube secured with: Tape Dental Injury: Teeth and Oropharynx as per pre-operative assessment  Comments: Inserted by Edmund Hilda, SRNA under direct supervision.

## 2020-06-24 NOTE — Op Note (Signed)
06/24/2020  6:44 PM  PATIENT:  Amanda Collier  73 y.o. 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   PRE-OPERATIVE DIAGNOSIS:  Subdural hematomas, bilateral  POST-OPERATIVE DIAGNOSIS:  Subdural hematomas, bilateral  PROCEDURE:  Procedure(s): Bilateral craniotomy for subdural hematoma evacuation  SURGEON: Surgeon(s): Ashok Pall, MD Vallarie Mare, MD  ASSISTANTS:thomas, jonathan  ANESTHESIA:   general  EBL:  Total I/O In: 900 [I.V.:800; IV Piggyback:100] Out: 36 [Urine:510; Blood:100]  BLOOD ADMINISTERED:none  CELL SAVER GIVEN:none  COUNT:per nursing  DRAINS: Ventriculostomy Drain in the left subdural space   SPECIMEN:  No Specimen  DICTATION: [name] was taken to the operating room, intubated, and placed under a general anesthetic without difficulty. She was positioned with her head up on a horseshoe head rest. Her head was shaved and  was prepped and draped in a sterile manner.  I planned both craniotomies making a linear incision on the left and right parietal bosses. I started on the right. I created two burr holes then turned a small craniotomy. I opened the dura and brownish red fluid shot out under pressure. There was no clotted blood to speak of. I irrigated copiously and elevated and removed a small portion of the membranes present on the cerebral surface. Once satisfied I loosely approximated the dura, then placed duragen over the surface. I approximated the skull flap with plates and screws. I closed the scalp with a galeal layer of sutures, and staples on the skin edges.  I then opened the left side using a number 10 blade. I placed a retractor, created a burr hole and then turned a small skull flap. I opened the dura and once more liquified hematoma was found. I used suction and irrigation to remove the blood. I also elevated the membrane on the cerebral surface and irrigated the brain surface. I placed a ventricular  catheter in the subdural space brought it out via a stab incision and secured it to the scalp. I placed a piece of duragen over the surface. I closed the incision in the same fashion as the right side.  I placed sterile dressings over the incisions and exit site for the catheter.  She was extubated, following commands, and moving all extremities.   PLAN OF CARE: Admit to inpatient   PATIENT DISPOSITION:  PACU - hemodynamically stable.   Delay start of Pharmacological VTE agent (>24hrs) due to surgical blood loss or risk of bleeding: no

## 2020-06-24 NOTE — ED Notes (Signed)
Plan for possible surgical intervention unclear, note and pt report reflect surgery today but no preop ordered and diet in place. MD paged to clarify. Pt remains NPO at this time

## 2020-06-24 NOTE — Anesthesia Procedure Notes (Signed)
Arterial Line Insertion Start/End8/10/2020 2:45 PM Performed by: Lillia Abed, MD, anesthesiologist  Patient location: Pre-op. Preanesthetic checklist: patient identified, IV checked, site marked, risks and benefits discussed, surgical consent, monitors and equipment checked, pre-op evaluation, timeout performed and anesthesia consent Lidocaine 1% used for infiltration Right, radial was placed Catheter size: 20 G Hand hygiene performed  and maximum sterile barriers used   Attempts: 3 Procedure performed without using ultrasound guided technique. Following insertion, dressing applied and Biopatch. Post procedure assessment: normal  Patient tolerated the procedure well with no immediate complications. Additional procedure comments: 2 prior attempts made by [facility] and CRNA. Marland Kitchen

## 2020-06-24 NOTE — Progress Notes (Addendum)
Patient's subdural drain leaking upon arrival to ICU. Continuous draining, with no collection of fluid in chamber/bag. Dr. Christella Noa notified and instructed to have a new bag available at bedside. MD to replace drain at bedside, soon.

## 2020-06-24 NOTE — ED Notes (Signed)
MS Breakfast Ordered 

## 2020-06-24 NOTE — Anesthesia Postprocedure Evaluation (Signed)
Anesthesia Post Note  Patient: Amanda Collier  Procedure(s) Performed: Bilateral craniotomy for evacuation of subdural hematoma (Bilateral Head)     Patient location during evaluation: PACU Anesthesia Type: General Level of consciousness: awake and alert Pain management: pain level controlled Vital Signs Assessment: post-procedure vital signs reviewed and stable Respiratory status: spontaneous breathing, nonlabored ventilation, respiratory function stable and patient connected to nasal cannula oxygen Cardiovascular status: blood pressure returned to baseline and stable Postop Assessment: no apparent nausea or vomiting Anesthetic complications: no   No complications documented.  Last Vitals:  Vitals:   06/24/20 1755 06/24/20 1808  BP:  128/88  Pulse:  (!) 56  Resp:  13  Temp: 36.7 C 37 C  SpO2:  100%    Last Pain:  Vitals:   06/24/20 1808  TempSrc: Oral  PainSc:                  Arwa Yero DAVID

## 2020-06-25 ENCOUNTER — Encounter (HOSPITAL_COMMUNITY): Payer: Self-pay | Admitting: Neurosurgery

## 2020-06-25 MED ORDER — PANTOPRAZOLE SODIUM 40 MG PO TBEC
40.0000 mg | DELAYED_RELEASE_TABLET | Freq: Every day | ORAL | Status: DC
  Administered 2020-06-25 – 2020-07-07 (×13): 40 mg via ORAL
  Filled 2020-06-25 (×13): qty 1

## 2020-06-25 MED ORDER — POTASSIUM CHLORIDE CRYS ER 20 MEQ PO TBCR
40.0000 meq | EXTENDED_RELEASE_TABLET | Freq: Once | ORAL | Status: AC
  Administered 2020-06-25: 40 meq via ORAL
  Filled 2020-06-25: qty 2

## 2020-06-25 NOTE — Progress Notes (Signed)
Patient ID: Amanda Collier, female   DOB: October 15, 1946, 74 y.o.   MRN: [id number] BP 127/67   Pulse 79   Temp 99.6 F (37.6 C) (Oral)   Resp 19   Wt 74.8 kg   LMP 11/14/2003   SpO2 98%   BMI 28.32 kg/m  Alert and oriented x 4, speech is clear and fluent Perrl, full eom Tongue and uvula midline Moving all extremities well Will check a ct scan tomorrow Doing well

## 2020-06-26 ENCOUNTER — Ambulatory Visit (HOSPITAL_COMMUNITY): Payer: Medicare Other

## 2020-06-26 ENCOUNTER — Inpatient Hospital Stay (HOSPITAL_COMMUNITY): Payer: Medicare Other | Admitting: Certified Registered"

## 2020-06-26 ENCOUNTER — Other Ambulatory Visit (HOSPITAL_COMMUNITY): Payer: Medicare Other

## 2020-06-26 ENCOUNTER — Inpatient Hospital Stay (HOSPITAL_COMMUNITY): Payer: Medicare Other

## 2020-06-26 ENCOUNTER — Encounter (HOSPITAL_COMMUNITY)
Admission: EM | Disposition: A | Discharge status: Rehabilitation Facility | Payer: Self-pay | Source: Home / Self Care | Attending: Neurosurgery

## 2020-06-26 HISTORY — PX: CRANIOTOMY: SHX93

## 2020-06-26 LAB — GLUCOSE, CAPILLARY: Glucose-Capillary: 99 mg/dL (ref 70–99)

## 2020-06-26 SURGERY — CRANIOTOMY HEMATOMA EVACUATION SUBDURAL
Anesthesia: General | Site: Head | Laterality: Right

## 2020-06-26 MED ORDER — FENTANYL CITRATE (PF) 250 MCG/5ML IJ SOLN
INTRAMUSCULAR | Status: DC | PRN
  Administered 2020-06-26 (×5): 50 ug via INTRAVENOUS

## 2020-06-26 MED ORDER — THROMBIN 20000 UNITS EX SOLR
CUTANEOUS | Status: AC
  Filled 2020-06-26: qty 20000

## 2020-06-26 MED ORDER — SODIUM CHLORIDE 0.9 % IV SOLN: INTRAVENOUS | Status: DC | PRN

## 2020-06-26 MED ORDER — BACITRACIN ZINC 500 UNIT/GM EX OINT
TOPICAL_OINTMENT | CUTANEOUS | Status: AC
  Filled 2020-06-26: qty 28.35

## 2020-06-26 MED ORDER — PHENYLEPHRINE HCL-NACL 10-0.9 MG/250ML-% IV SOLN
INTRAVENOUS | Status: AC
  Filled 2020-06-26: qty 250

## 2020-06-26 MED ORDER — PHENYLEPHRINE HCL (PRESSORS) 10 MG/ML IV SOLN
INTRAVENOUS | Status: DC | PRN
  Administered 2020-06-26: 80 ug via INTRAVENOUS

## 2020-06-26 MED ORDER — 0.9 % SODIUM CHLORIDE (POUR BTL) OPTIME
TOPICAL | Status: DC | PRN
  Administered 2020-06-26: 1000 mL

## 2020-06-26 MED ORDER — BACITRACIN ZINC 500 UNIT/GM EX OINT
TOPICAL_OINTMENT | CUTANEOUS | Status: DC | PRN
  Administered 2020-06-26: 1 via TOPICAL

## 2020-06-26 MED ORDER — PHENYLEPHRINE HCL-NACL 10-0.9 MG/250ML-% IV SOLN
INTRAVENOUS | Status: DC | PRN
  Administered 2020-06-26: 25 ug/min via INTRAVENOUS

## 2020-06-26 MED ORDER — FENTANYL CITRATE (PF) 250 MCG/5ML IJ SOLN
INTRAMUSCULAR | Status: AC
  Filled 2020-06-26: qty 5

## 2020-06-26 MED ORDER — ACETAMINOPHEN 10 MG/ML IV SOLN
INTRAVENOUS | Status: AC
  Filled 2020-06-26: qty 100

## 2020-06-26 MED ORDER — SUGAMMADEX SODIUM 200 MG/2ML IV SOLN
INTRAVENOUS | Status: DC | PRN
  Administered 2020-06-26: 300 mg via INTRAVENOUS

## 2020-06-26 MED ORDER — CHLORHEXIDINE GLUCONATE 0.12 % MT SOLN
OROMUCOSAL | Status: AC
  Administered 2020-06-26: 15 mL via OROMUCOSAL
  Filled 2020-06-26: qty 15

## 2020-06-26 MED ORDER — DEXAMETHASONE SODIUM PHOSPHATE 10 MG/ML IJ SOLN
INTRAMUSCULAR | Status: DC | PRN
  Administered 2020-06-26: 10 mg via INTRAVENOUS

## 2020-06-26 MED ORDER — ROCURONIUM BROMIDE 10 MG/ML (PF) SYRINGE
PREFILLED_SYRINGE | INTRAVENOUS | Status: DC | PRN
  Administered 2020-06-26: 60 mg via INTRAVENOUS

## 2020-06-26 MED ORDER — ONDANSETRON HCL 4 MG/2ML IJ SOLN
INTRAMUSCULAR | Status: DC | PRN
  Administered 2020-06-26: 4 mg via INTRAVENOUS

## 2020-06-26 MED ORDER — PROPOFOL 10 MG/ML IV BOLUS
INTRAVENOUS | Status: AC
  Filled 2020-06-26: qty 20

## 2020-06-26 MED ORDER — LIDOCAINE 2% (20 MG/ML) 5 ML SYRINGE
INTRAMUSCULAR | Status: DC | PRN
  Administered 2020-06-26: 60 mg via INTRAVENOUS

## 2020-06-26 MED ORDER — THROMBIN 20000 UNITS EX SOLR
CUTANEOUS | Status: DC | PRN
  Administered 2020-06-26: 20 mL via TOPICAL

## 2020-06-26 MED ORDER — SODIUM CHLORIDE 0.9 % IV SOLN: INTRAVENOUS | Status: DC

## 2020-06-26 MED ORDER — CHLORHEXIDINE GLUCONATE 0.12 % MT SOLN: 15.0000 mL | Freq: Once | OROMUCOSAL | Status: AC

## 2020-06-26 MED ORDER — ACETAMINOPHEN 10 MG/ML IV SOLN
INTRAVENOUS | Status: DC | PRN
Start: 2020-06-26 — End: 2020-06-26
  Administered 2020-06-26: 1000 mg via INTRAVENOUS

## 2020-06-26 MED ORDER — PROPOFOL 10 MG/ML IV BOLUS
INTRAVENOUS | Status: DC | PRN
  Administered 2020-06-26: 50 mg via INTRAVENOUS
  Administered 2020-06-26 (×2): 20 mg via INTRAVENOUS
  Administered 2020-06-26: 60 mg via INTRAVENOUS

## 2020-06-26 SURGICAL SUPPLY — 62 items
APL SKNCLS STERI-STRIP NONHPOA (GAUZE/BANDAGES/DRESSINGS)
BENZOIN TINCTURE PRP APPL 2/3 (GAUZE/BANDAGES/DRESSINGS) IMPLANT
BLADE CLIPPER SURG (BLADE) ×2 IMPLANT
BLADE ULTRA TIP 2M (BLADE) ×2 IMPLANT
BNDG GAUZE ELAST 4 BULKY (GAUZE/BANDAGES/DRESSINGS) IMPLANT
BUR ACORN 6.0 PRECISION (BURR) IMPLANT
BUR MATCHSTICK NEURO 3.0 LAGG (BURR) IMPLANT
BUR SPIRAL ROUTER 2.3 (BUR) ×2 IMPLANT
CANISTER SUCT 3000ML PPV (MISCELLANEOUS) ×2 IMPLANT
CARTRIDGE OIL MAESTRO DRILL (MISCELLANEOUS) ×1 IMPLANT
CLIP VESOCCLUDE MED 6/CT (CLIP) IMPLANT
COVER WAND RF STERILE (DRAPES) ×2 IMPLANT
DIFFUSER DRILL AIR PNEUMATIC (MISCELLANEOUS) ×2 IMPLANT
DRAPE NEUROLOGICAL W/INCISE (DRAPES) ×2 IMPLANT
DRAPE SURG 17X23 STRL (DRAPES) IMPLANT
DRAPE WARM FLUID 44X44 (DRAPES) ×2 IMPLANT
DRSG TELFA 3X8 NADH (GAUZE/BANDAGES/DRESSINGS) ×2 IMPLANT
DURAPREP 6ML APPLICATOR 50/CS (WOUND CARE) ×2 IMPLANT
ELECT REM PT RETURN 9FT ADLT (ELECTROSURGICAL) ×2
ELECTRODE REM PT RTRN 9FT ADLT (ELECTROSURGICAL) ×1 IMPLANT
EVACUATOR 1/8 PVC DRAIN (DRAIN) IMPLANT
EVACUATOR SILICONE 100CC (DRAIN) IMPLANT
GAUZE 4X4 16PLY RFD (DISPOSABLE) IMPLANT
GAUZE SPONGE 4X4 12PLY STRL (GAUZE/BANDAGES/DRESSINGS) ×2 IMPLANT
GLOVE BIO SURGEON STRL SZ 6.5 (GLOVE) ×2 IMPLANT
GLOVE BIO SURGEON STRL SZ7 (GLOVE) ×2 IMPLANT
GLOVE ECLIPSE 6.5 STRL STRAW (GLOVE) ×2 IMPLANT
GLOVE ECLIPSE 7.0 STRL STRAW (GLOVE) ×2 IMPLANT
GLOVE EXAM NITRILE XL STR (GLOVE) IMPLANT
GOWN STRL REUS W/ TWL LRG LVL3 (GOWN DISPOSABLE) ×2 IMPLANT
GOWN STRL REUS W/ TWL XL LVL3 (GOWN DISPOSABLE) IMPLANT
GOWN STRL REUS W/TWL 2XL LVL3 (GOWN DISPOSABLE) IMPLANT
GOWN STRL REUS W/TWL LRG LVL3 (GOWN DISPOSABLE) ×4
GOWN STRL REUS W/TWL XL LVL3 (GOWN DISPOSABLE)
GRAFT DURAGEN MATRIX 2WX2L ×2 IMPLANT
HEMOSTAT SURGICEL 2X14 (HEMOSTASIS) IMPLANT
KIT BASIN OR (CUSTOM PROCEDURE TRAY) ×2 IMPLANT
KIT TURNOVER KIT B (KITS) ×2 IMPLANT
NEEDLE HYPO 25X1 1.5 SAFETY (NEEDLE) ×2 IMPLANT
NS IRRIG 1000ML POUR BTL (IV SOLUTION) ×6 IMPLANT
OIL CARTRIDGE MAESTRO DRILL (MISCELLANEOUS) ×2
PACK CRANIOTOMY CUSTOM (CUSTOM PROCEDURE TRAY) ×2 IMPLANT
PATTIES SURGICAL .5 X.5 (GAUZE/BANDAGES/DRESSINGS) IMPLANT
PATTIES SURGICAL .5 X3 (DISPOSABLE) IMPLANT
PATTIES SURGICAL 1X1 (DISPOSABLE) IMPLANT
PLATE CRANIAL SHUNT 14 (Plate) ×2 IMPLANT
SCREW UNIII AXS SD 1.5X4 (Screw) ×10 IMPLANT
SPONGE NEURO XRAY DETECT 1X3 (DISPOSABLE) IMPLANT
SPONGE SURGIFOAM ABS GEL 100 (HEMOSTASIS) ×2 IMPLANT
STAPLER VISISTAT 35W (STAPLE) ×2 IMPLANT
SUT ETHILON 3 0 FSL (SUTURE) IMPLANT
SUT ETHILON 3 0 PS 1 (SUTURE) IMPLANT
SUT NURALON 4 0 TR CR/8 (SUTURE) ×2 IMPLANT
SUT STEEL 0 (SUTURE)
SUT STEEL 0 18XMFL TIE 17 (SUTURE) IMPLANT
SUT VIC AB 2-0 CT2 18 VCP726D (SUTURE) ×4 IMPLANT
TOWEL GREEN STERILE (TOWEL DISPOSABLE) ×2 IMPLANT
TOWEL GREEN STERILE FF (TOWEL DISPOSABLE) ×2 IMPLANT
TRAY FOLEY MTR SLVR 16FR STAT (SET/KITS/TRAYS/PACK) ×2 IMPLANT
TUBE CONNECTING 12X1/4 (SUCTIONS) ×2 IMPLANT
UNDERPAD 30X36 HEAVY ABSORB (UNDERPADS AND DIAPERS) ×2 IMPLANT
WATER STERILE IRR 1000ML POUR (IV SOLUTION) ×2 IMPLANT

## 2020-06-26 NOTE — Progress Notes (Signed)
Subjective: Patient reports Patient doing well improved headache  Objective: Vital signs in last 24 hours: Temp:  [99.1 F (37.3 C)-99.9 F (37.7 C)] 99.7 F (37.6 C) (08/14 0400) Pulse Rate:  [59-86] 66 (08/14 0700) Resp:  [16-28] 19 (08/14 0700) BP: (96-161)/(55-99) 148/72 (08/14 0700) SpO2:  [93 %-100 %] 97 % (08/14 0700)  Intake/Output from previous day: 08/13 0701 - 08/14 0700 In: 316.3 [I.V.:216.3; IV Piggyback:100] Out: 1950 [Urine:1950] Intake/Output this shift: No intake/output data recorded.  Awake alert oriented moves all extremities well  Lab Results: Recent Labs    06/23/20 2126 06/24/20 1854  WBC 4.4 4.8  HGB 12.8 12.3  HCT 38.7 37.4  PLT 278 271   BMET Recent Labs    06/23/20 2126 06/24/20 1854  NA 142  --   K 3.3*  --   CL 109  --   CO2 23  --   GLUCOSE 102*  --   BUN 15  --   CREATININE 0.82 0.89  CALCIUM 9.5  --     Studies/Results: No results found.  Assessment/Plan: Postop day 2 bilateral craniotomies for subdural doing well awaiting CT scan hopefully can discontinue left-sided subdural drain and then mobilize the patient with physical and Occupational Therapyo...  LOS: 3 days     Elaina Hoops 06/26/2020, 7:56 AM

## 2020-06-26 NOTE — Anesthesia Preprocedure Evaluation (Addendum)
Anesthesia Evaluation  Patient identified by MRN, date of birth, ID band Patient awake    Reviewed: Allergy & Precautions, NPO status , Patient's Chart, lab work & pertinent test results  History of Anesthesia Complications Negative for: history of anesthetic complications  Airway Mallampati: I  TM Distance: >3 FB Neck ROM: Full    Dental  (+) Dental Advisory Given   Pulmonary    breath sounds clear to auscultation       Cardiovascular hypertension, Pt. on medications  Rhythm:Regular     Neuro/Psych  Headaches, SUDURAL HEMATOMA    GI/Hepatic negative GI ROS, Neg liver ROS,   Endo/Other  negative endocrine ROS  Renal/GU negative Renal ROS     Musculoskeletal negative musculoskeletal ROS (+)   Abdominal   Peds  Hematology negative hematology ROS (+)   Anesthesia Other Findings   Reproductive/Obstetrics                           Anesthesia Physical Anesthesia Plan  ASA: II  Anesthesia Plan: General   Post-op Pain Management:    Induction: Intravenous  PONV Risk Score and Plan: 3 and Ondansetron and Dexamethasone  Airway Management Planned: Oral ETT  Additional Equipment: None  Intra-op Plan:   Post-operative Plan: Extubation in OR  Informed Consent: I have reviewed the patients History and Physical, chart, labs and discussed the procedure including the risks, benefits and alternatives for the proposed anesthesia with the patient or authorized representative who has indicated his/her understanding and acceptance.     Dental advisory given  Plan Discussed with: CRNA and Surgeon  Anesthesia Plan Comments:         Anesthesia Quick Evaluation

## 2020-06-26 NOTE — Transfer of Care (Signed)
Immediate Anesthesia Transfer of Care Note  Patient: Amanda Collier  Procedure(s) Performed: CRANIOTOMY HEMATOMA EVACUATION SUBDURAL (Right Head)  Patient Location: PACU  Anesthesia Type:General  Level of Consciousness: drowsy  Airway & Oxygen Therapy: Patient Spontanous Breathing and Patient connected to nasal cannula oxygen  Post-op Assessment: Report given to RN and Post -op Vital signs reviewed and stable  Post vital signs: Reviewed and stable  Last Vitals:  Vitals Value Taken Time  BP 137/66 06/26/20 1850  Temp    Pulse 60 06/26/20 1851  Resp 14 06/26/20 1851  SpO2 100 % 06/26/20 1851  Vitals shown include unvalidated device data.  Last Pain:  Vitals:   06/26/20 1850  TempSrc:   PainSc: (P) 0-No pain         Complications: No complications documented.

## 2020-06-26 NOTE — Op Note (Signed)
06/26/2020  7:20 PM  PATIENT:  Amanda Collier  74 y.o. 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   PRE-OPERATIVE DIAGNOSIS:  SUDURAL HEMATOMA, right side  POST-OPERATIVE DIAGNOSIS:  SUDURAL HEMATOMA, right side  PROCEDURE:  Procedure(s): right redo CRANIOTOMY HEMATOMA EVACUATION SUBDURAL  SURGEON: Surgeon(s): Ashok Pall, MD  ASSISTANTS:none  ANESTHESIA:   general  EBL:  No intake/output data recorded.  BLOOD ADMINISTERED:none  CELL SAVER GIVEN:none  COUNT:per nursing  DRAINS: Ventriculostomy Drain in the right subdural space   SPECIMEN:  No Specimen  DICTATION: Amanda Collier was taken to the operating room, intubated, and placed under a general anesthetic without difficulty. She was positioned supine with her head on a horseshoe head rest. Her head was prepped and draped in a sterile manner.  I used scissors to open the incision then placed retractors. I removed the bone flap. I opened the dura, then used suction to remove acute and chronic blood. I irrigated copiously. I placed a ventricular drain in the subdural space and brought it out through the skin. I approximated the dura loosely, placed a piece of duragen over the dura. I placed some tack up sutures around the edge of the craniotomy. I attached the flap with plates and screws.  I approximated the galea with sutures, and the skin edges with staples. I applied a sterile dressing. She was extubated and following commands.   PLAN OF CARE: Admit to inpatient   PATIENT DISPOSITION:  PACU - hemodynamically stable.   Delay start of Pharmacological VTE agent (>24hrs) due to surgical blood loss or risk of bleeding:  no

## 2020-06-26 NOTE — Anesthesia Procedure Notes (Signed)
Procedure Name: Intubation Date/Time: 06/26/2020 5:21 PM Performed by: Clearnce Sorrel, CRNA Pre-anesthesia Checklist: Patient identified, Emergency Drugs available, Suction available, Patient being monitored and Timeout performed Patient Re-evaluated:Patient Re-evaluated prior to induction Oxygen Delivery Method: Circle system utilized Preoxygenation: Pre-oxygenation with 100% oxygen Induction Type: IV induction Ventilation: Mask ventilation without difficulty Laryngoscope Size: Miller and 2 Grade View: Grade II Tube type: Oral Tube size: 7.0 mm Number of attempts: 1 Airway Equipment and Method: Bougie stylet and Stylet Placement Confirmation: ETT inserted through vocal cords under direct vision,  positive ETCO2 and breath sounds checked- equal and bilateral Secured at: 22 cm Tube secured with: Tape Dental Injury: Teeth and Oropharynx as per pre-operative assessment

## 2020-06-26 NOTE — Progress Notes (Signed)
Patient ID: Amanda Collier, female   DOB: 17-Sep-1946, 74 y.o.   MRN: [id number] Films reviewed. Will take her back to the operating room to evacuate on the right side. More air than blood bilaterally, but believe it will help to do the redo. Will go approximately 1700. Moving all extremities well Alert and oriented x 4, speech is clear and fluent.

## 2020-06-27 NOTE — Progress Notes (Signed)
Subjective: Patient reports Patient wide awake alert no complaints little bit more right-sided headache and left  Objective: Vital signs in last 24 hours: Temp:  [97.6 F (36.4 C)-99.2 F (37.3 C)] 98.6 F (37 C) (08/14 2000) Pulse Rate:  [51-86] 65 (08/15 0800) Resp:  [10-22] 13 (08/15 0800) BP: (108-159)/(54-93) 145/71 (08/15 0700) SpO2:  [95 %-100 %] 96 % (08/15 0800)  Intake/Output from previous day: 08/14 0701 - 08/15 0700 In: 1304.6 [P.O.:360; I.V.:794.6; IV Piggyback:150] Out: 1700 [Urine:1650; Blood:50] Intake/Output this shift: Total I/O In: 10 [I.V.:10] Out: 75 [Urine:75]  Awake alert orient x4 extremities well  Lab Results: Recent Labs    06/24/20 1854  WBC 4.8  HGB 12.3  HCT 37.4  PLT 271   BMET Recent Labs    06/24/20 1854  CREATININE 0.89    Studies/Results: CT HEAD WO CONTRAST  Result Date: 06/26/2020 CLINICAL DATA:  Subdural hemorrhage, follow-up EXAM: CT HEAD WITHOUT CONTRAST TECHNIQUE: Contiguous axial images were obtained from the base of the skull through the vertex without intravenous contrast. COMPARISON:  Correlation made with MRI from 06/23/2020 FINDINGS: Brain: Postoperative changes of right subdural hematoma evacuation. Residual mixed density extra-axial hematoma as well as substantial air. The non-air component measures up to 1.7 cm in thickness (1.8 cm on prior MRI). There are also postoperative changes of left subdural hematoma evacuation with drain placement. Residual mixed density extra-axial hematoma as well as substantial air. The non-air component measures up to 1 cm in thickness (1.9 cm on prior MRI). Mass effect is present on the underlying brain parenchyma. There is new leftward midline shift measuring 6 mm at the level of the septum pellucidum. Suspected trace subdural hemorrhage along the right aspect of the falx. Gray-white differentiation is preserved. Patchy hypoattenuation in the supratentorial white matter is nonspecific but may  reflect mild chronic microvascular ischemic changes. Vascular: There is atherosclerotic calcification at the skull base. Skull: Right larger than left craniotomies. Sinuses/Orbits: No acute finding. Other: Scalp soft tissue swelling associated with postoperative changes. Mastoid air cells are clear. IMPRESSION: Postoperative changes of bilateral subdural hematoma evacuation with drain placement on the left. There is substantial extra-axial air bilaterally. Residual hematoma on the right measures similar in size while hematoma on the left has decreased in size. As a result, there is new mild leftward midline shift. Electronically Signed   By: Macy Mis M.D.   On: 06/26/2020 10:30    Assessment/Plan: Postop day 3 and 1 bilateral craniotomies for evacuation of subdural doing very well this morning awake alert oriented will mobilize the patient patient has bilateral subdural drains and will keep the drains for now plan removal per Dr. Christella Noa  LOS: 4 days     Elaina Hoops 06/27/2020, 8:07 AM

## 2020-06-27 NOTE — Anesthesia Postprocedure Evaluation (Signed)
Anesthesia Post Note  Patient: Amanda Collier  Procedure(s) Performed: CRANIOTOMY HEMATOMA EVACUATION SUBDURAL (Right Head)     Patient location during evaluation: PACU Anesthesia Type: General Level of consciousness: patient cooperative and awake Pain management: pain level controlled Vital Signs Assessment: post-procedure vital signs reviewed and stable Respiratory status: spontaneous breathing, nonlabored ventilation, respiratory function stable and patient connected to nasal cannula oxygen Cardiovascular status: blood pressure returned to baseline and stable Postop Assessment: no apparent nausea or vomiting Anesthetic complications: no   No complications documented.  Last Vitals:  Vitals:   06/27/20 0500 06/27/20 0600  BP: 114/62 138/67  Pulse: (!) 55 60  Resp: 13 15  Temp:    SpO2: 99% 97%    Last Pain:  Vitals:   06/27/20 0400  TempSrc:   PainSc: 0-No pain                 Claborn Janusz

## 2020-06-28 ENCOUNTER — Inpatient Hospital Stay (HOSPITAL_COMMUNITY): Payer: Medicare Other

## 2020-06-28 ENCOUNTER — Encounter (HOSPITAL_COMMUNITY): Payer: Self-pay | Admitting: Neurosurgery

## 2020-06-28 MED ORDER — LEVETIRACETAM IN NACL 500 MG/100ML IV SOLN
500.0000 mg | Freq: Two times a day (BID) | INTRAVENOUS | Status: DC
  Administered 2020-06-28 – 2020-07-03 (×11): 500 mg via INTRAVENOUS
  Filled 2020-06-28 (×11): qty 100

## 2020-06-28 NOTE — Progress Notes (Signed)
Sudden onset of slurred speech and L facial droop.  Intermittent lower lip twitching that pt's husband reports is not normal.  Dr. Christella Noa notified and ordered a stat CT.

## 2020-06-28 NOTE — Progress Notes (Signed)
Patient ID: Amanda Collier, female   DOB: [birth year], 74 y.o.   MRN: [id number] BP 137/65   Pulse 61   Temp 99.5 F (37.5 C) (Oral)   Resp 13   Wt 74.8 kg   LMP 11/14/2003   SpO2 96%   BMI 28.32 kg/m  Alert and oriented x4 Speech is mildly dysarthric Fluent perrl Head ct shows subdural blood right under the flap. Significant amount of air frontally bilaterally Will remove drains tomorrow.  Possible seizure, started [place]
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
## 2020-06-29 MED ORDER — ORAL CARE MOUTH RINSE
15.0000 mL | Freq: Two times a day (BID) | OROMUCOSAL | Status: DC
  Administered 2020-06-29 – 2020-07-08 (×17): 15 mL via OROMUCOSAL

## 2020-06-29 MED ORDER — SENNOSIDES-DOCUSATE SODIUM 8.6-50 MG PO TABS
1.0000 | ORAL_TABLET | Freq: Two times a day (BID) | ORAL | Status: DC
  Administered 2020-06-29 – 2020-07-07 (×16): 1 via ORAL
  Filled 2020-06-29 (×18): qty 1

## 2020-06-29 NOTE — Progress Notes (Signed)
  Speech Language Pathology Treatment: Cognitive-Linquistic  Patient Details Name: Amanda Collier MRN: [id number] DOB: Jun 25, 1946 Today's Date: 06/29/2020 Time: 3112-1624 SLP Time Calculation (min) (ACUTE ONLY): 10 min  Assessment / Plan / Recommendation Clinical Impression  SLP was requested back in the room by pt's spouse, who said that she had some confusion regarding ordering lunch. SLP had stated during evaluation that pt will need to order her own food (with assistance), but she needed reminders to do so. Assistance was provided to scan through a menu and make selections within softer diet textures. Min cues were needed for sustained attention (compared to Mod cues throughout evaluation) to brief task. Pt was also noted to have anterior loss of secretions from the L side of her mouth with Min cues needed for awareness and problem solving. Will continue to follow.    HPI HPI: 74 yo with hx of DM, balance disorder, HTN and B subdural hematomas. Pt has fallen 7 times in last 3 weeks. Underwent evacuation of B hematomas 8/12 and again on 8/14. Pt with B ventriculostomy drains. On 8/2 pt had xrays of B ankles due to pain and pt with L nondisplaced fx of lateral malleolus and nondisplaced fx of lateral malleolus R fibula; sprain of tibiofibular ligament R ankle.      SLP Plan  Continue with current plan of care  Patient needs continued Speech Lanaguage Pathology Services    Recommendations  Diet recommendations: Dysphagia 3 (mechanical soft);Thin liquid Liquids provided via: Straw Medication Administration: Whole meds with puree Supervision: Staff to assist with self feeding;Full supervision/cueing for compensatory strategies Compensations: Slow rate;Small sips/bites;Minimize environmental distractions;Lingual sweep for clearance of pocketing Postural Changes and/or Swallow Maneuvers: Seated upright 90 degrees                Oral Care Recommendations: Oral care BID Follow up  Recommendations: Inpatient Rehab SLP Visit Diagnosis: Cognitive communication deficit (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 [place] [facility] Pager [phone] Office [phone]  06/29/2020, 12:15 PM

## 2020-06-29 NOTE — Evaluation (Signed)
Speech Language Pathology Evaluation Patient Details Name: Amanda Collier MRN: [id number] DOB: [birth year] Today's Date: 06/29/2020 Time: 1000-1020 SLP Time Calculation (min) (ACUTE ONLY): 20 min  Problem List:  Patient Active Problem List   Diagnosis Date Noted  . Status post craniotomy 06/24/2020  . Fall 06/23/2020  . Gait abnormality 06/23/2020  . Subdural hematoma, chronic ([place]) 06/23/2020  . Headache syndrome 12/18/2016  . Essential hypertension 12/17/2015  . Elevated lipids 12/17/2015  . Borderline diabetes 12/17/2015   Past Medical History:  Past Medical History:  Diagnosis Date  . Headache syndrome 12/18/2016  . High blood pressure   . Hypertension    followed by Dr Amanda Collier medications  . Leg swelling    Hx of   Past Surgical History:  Past Surgical History:  Procedure Laterality Date  . CRANIOTOMY Bilateral 06/24/2020   Procedure: Bilateral craniotomy for evacuation of subdural hematoma;  Surgeon: Ashok Pall, MD;  Location: [facility];  Service: Neurosurgery;  Laterality: Bilateral;  . CRANIOTOMY Right 06/26/2020   Procedure: CRANIOTOMY HEMATOMA EVACUATION SUBDURAL;  Surgeon: Ashok Pall, MD;  Location: [place];  Service: Neurosurgery;  Laterality: Right;  . NO PAST SURGERIES     HPI:  74 yo with hx of DM, balance disorder, HTN and B subdural hematomas. Pt has fallen 7 times in last 3 weeks. Underwent evacuation of B hematomas 8/12 and again on 8/14. Pt with B ventriculostomy drains. On 8/2 pt had xrays of B ankles due to pain and pt with L nondisplaced fx of lateral malleolus and nondisplaced fx of lateral malleolus R fibula; sprain of tibiofibular ligament R ankle.   Assessment / Plan / Recommendation Clinical Impression  Pt presents with reduced sustained attention, very distracted by her environment and even by internal factors after distractions are reduced. Her communication is therefore tangential and she has trouble sustaining topics of conversation.  She also exhibits reduced memory, problem solving, and awareness of acute changes. Her speech is mildly slurred with imprecise articulation. Given her high level of independence PTA, she will benefit from SLP f/u to maximize cognitive function.     SLP Assessment  SLP Recommendation/Assessment: Patient needs continued Speech Lanaguage Pathology Services SLP Visit Diagnosis: Cognitive communication deficit (R41.841)    Follow Up Recommendations  Inpatient Rehab    Frequency and Duration min 2x/week  2 weeks      SLP Evaluation Cognition  Overall Cognitive Status: Impaired/Different from baseline Arousal/Alertness: Awake/alert Orientation Level: Oriented X4 Attention: Sustained Sustained Attention: Impaired Sustained Attention Impairment: Verbal basic;Functional basic Memory: Impaired Memory Impairment: Retrieval deficit;Decreased short term memory Decreased Short Term Memory: Verbal basic Awareness: Impaired Awareness Impairment: Intellectual impairment;Emergent impairment Problem Solving: Impaired Problem Solving Impairment: Functional basic Safety/Judgment: Impaired       Comprehension  Auditory Comprehension Overall Auditory Comprehension: Appears within functional limits for tasks assessed    Expression Expression Primary Mode of Expression: Verbal Verbal Expression Overall Verbal Expression: Impaired Initiation: No impairment Level of Generative/Spontaneous Verbalization: Conversation Pragmatics: Impairment Impairments: Topic appropriateness;Topic maintenance Interfering Components: Attention Effective Techniques: Other (Comment) (reducing distractions) Non-Verbal Means of Communication: Not applicable   Oral / Motor  Oral Motor/Sensory Function Overall Oral Motor/Sensory Function: Mild impairment Facial ROM: Reduced left;Suspected CN VII (facial) dysfunction Facial Symmetry: Abnormal symmetry left;Suspected CN VII (facial) dysfunction Facial Strength: Reduced  left;Suspected CN VII (facial) dysfunction Lingual ROM: Within Functional Limits Lingual Symmetry: Within Functional Limits Lingual Strength: Within Functional Limits Velum: Within Functional Limits Mandible: Within Functional Limits Motor Speech Overall Motor Speech:  Impaired Respiration: Within functional limits Phonation: Normal Resonance: Within functional limits Articulation: Impaired Level of Impairment: Conversation Intelligibility: Intelligible   GO                    Osie Bond., M.A. [place] Pager [phone] Office [phone]  06/29/2020, 12:05 PM

## 2020-06-29 NOTE — Evaluation (Signed)
Clinical/Bedside Swallow Evaluation Patient Details  Name: Amanda Collier MRN: [id number] Date of Birth: Jul 29, 1946  Today's Date: 06/29/2020 Time: SLP Start Time (ACUTE ONLY): 0945 SLP Stop Time (ACUTE ONLY): 1000 SLP Time Calculation (min) (ACUTE ONLY): 15 min  Past Medical History:  Past Medical History:  Diagnosis Date  . Headache syndrome 12/18/2016  . High blood pressure   . Hypertension    followed by Dr Gillian Shields medications  . Leg swelling    Hx of   Past Surgical History:  Past Surgical History:  Procedure Laterality Date  . CRANIOTOMY Bilateral 06/24/2020   Procedure: Bilateral craniotomy for evacuation of subdural hematoma;  Surgeon: Ashok Pall, MD;  Location: [facility];  Service: Neurosurgery;  Laterality: Bilateral;  . CRANIOTOMY Right 06/26/2020   Procedure: CRANIOTOMY HEMATOMA EVACUATION SUBDURAL;  Surgeon: Ashok Pall, MD;  Location: [place];  Service: Neurosurgery;  Laterality: Right;  . NO PAST SURGERIES     HPI:  74 yo with hx of DM, balance disorder, HTN and B subdural hematomas. Pt has fallen 7 times in last 3 weeks. Underwent evacuation of B hematomas 8/12 and again on 8/14. Pt with B ventriculostomy drains. On 8/2 pt had xrays of B ankles due to pain and pt with L nondisplaced fx of lateral malleolus and nondisplaced fx of lateral malleolus R fibula; sprain of tibiofibular ligament R ankle.   Assessment / Plan / Recommendation Clinical Impression  Pt has mild L-sided facial weakness with mild-moderate effects on swallowing function given reduced awareness and suspect impaired sensation. She has L buccal pocketing and anterior loss, requiring Mod cues from SLP to regulate. Coughing was only noted when she was trying to swallow pills with thin liquids or when she had a larger amount of solid food in her mouth that she was trying to clear with a liquid wash. Recommend downgrading to Dys 3 (mech soft) solids and thin liquids with meds whole in puree (crush  larger ones PRN).   SLP Visit Diagnosis: Dysphagia, oral phase (R13.11)    Aspiration Risk  Moderate aspiration risk    Diet Recommendation Dysphagia 3 (Mech soft);Thin liquid   Liquid Administration via: Cup;Straw Medication Administration: Whole meds with puree Supervision: Staff to assist with self feeding;Full supervision/cueing for compensatory strategies Compensations: Slow rate;Small sips/bites;Minimize environmental distractions;Lingual sweep for clearance of pocketing Postural Changes: Seated upright at 90 degrees    Other  Recommendations Oral Care Recommendations: Oral care QID Other Recommendations: Have oral suction available   Follow up Recommendations Inpatient Rehab      Frequency and Duration min 2x/week  2 weeks       Prognosis Prognosis for Safe Diet Advancement: Good Barriers to Reach Goals: Cognitive deficits      Swallow Study   General HPI: 74 yo with hx of DM, balance disorder, HTN and B subdural hematomas. Pt has fallen 7 times in last 3 weeks. Underwent evacuation of B hematomas 8/12 and again on 8/14. Pt with B ventriculostomy drains. On 8/2 pt had xrays of B ankles due to pain and pt with L nondisplaced fx of lateral malleolus and nondisplaced fx of lateral malleolus R fibula; sprain of tibiofibular ligament R ankle. Type of Study: Bedside Swallow Evaluation Previous Swallow Assessment: none in chart Diet Prior to this Study: Regular;Thin liquids Temperature Spikes Noted: No Respiratory Status: Room air History of Recent Intubation:  (for procedure) Behavior/Cognition: Alert;Cooperative;Pleasant mood;Distractible;Requires cueing Oral Cavity Assessment: Within Functional Limits Oral Care Completed by SLP: Yes Oral Cavity - Dentition:  Adequate natural dentition Vision: Functional for self-feeding Self-Feeding Abilities: Able to feed self;Needs assist Patient Positioning: Upright in bed Baseline Vocal Quality: Normal Volitional Swallow: Able to  elicit    Oral/Motor/Sensory Function Overall Oral Motor/Sensory Function: Mild impairment Facial ROM: Reduced left;Suspected CN VII (facial) dysfunction Facial Symmetry: Abnormal symmetry left;Suspected CN VII (facial) dysfunction Facial Strength: Reduced left;Suspected CN VII (facial) dysfunction Lingual ROM: Within Functional Limits Lingual Symmetry: Within Functional Limits Lingual Strength: Within Functional Limits Velum: Within Functional Limits Mandible: Within Functional Limits   Ice Chips Ice chips: Not tested   Thin Liquid Thin Liquid: Impaired Presentation: Cup;Self Fed;Straw Oral Phase Impairments: Reduced labial seal Oral Phase Functional Implications: Left anterior spillage;Right anterior spillage Pharyngeal  Phase Impairments: Cough - Delayed    Nectar Thick Nectar Thick Liquid: Not tested   Honey Thick Honey Thick Liquid: Not tested   Puree Puree: Impaired Presentation: Self Fed;Spoon Oral Phase Impairments: Reduced labial seal Oral Phase Functional Implications: Left lateral sulci pocketing;Oral residue   Solid     Solid: Impaired Presentation: Self Fed Oral Phase Impairments: Poor awareness of bolus Oral Phase Functional Implications: Left anterior spillage;Left lateral sulci pocketing;Oral residue      Osie Bond., M.A. [place] Pager [phone] Office [phone]  06/29/2020,10:41 AM

## 2020-06-29 NOTE — Progress Notes (Signed)
Inpatient Rehab Admissions Coordinator Note:   Per therapy recommendations, pt was screened for CIR candidacy by Shann Medal, PT, DPT.  At this time we will follow for tolerance, as pt has not been medically stable for out of bed mobility/ADL assessment yet.  Please contact me with questions.   Shann Medal, PT, DPT [phone] 06/29/20 2:07 PM

## 2020-06-29 NOTE — Evaluation (Signed)
Physical Therapy Evaluation Patient Details Name: Amanda Collier MRN: [id number] DOB: 29-Jun-1946 Today's Date: 06/29/2020   History of Present Illness  74 yo with hx of DM, balance disorder, HTN and B subdural hematomas. Pt has fallen 7 tinmes in last 3 weeks. Underwent evacuation of B hematomas 8/12 and again on 8/14. Pt with B ventriculostomy drains. On 8/2 pt had xrays of B ankles due to pain and pt with L nondisplaced fx of lateral malleolus and nondisplaced fx of lateral malleolus R fibulu; sprian of tibiofibular ligament R ankle  Clinical Impression  Patient presents with decreased mobility due to decreased balance, decreased strength, decreased cognition with decreased safety awareness, impulsivity, and fear of falling.  She will benefit from skilled PT in the acute setting and from follow up CIR level rehab prior to d/c home.     Follow Up Recommendations CIR    Equipment Recommendations  Wheelchair (measurements PT)    Recommendations for Other Services       Precautions / Restrictions Precautions Precautions: Fall Precaution Comments: R ventriculostomy drain Required Braces or Orthoses: Other Brace Other Brace: Cam boot L; lace up ankle brace R Restrictions Other Position/Activity Restrictions: WBAT with braces per pt      Mobility  Bed Mobility Overal bed mobility: Needs Assistance Bed Mobility: Sidelying to Sit;Sit to Supine;Rolling Rolling: Min assist Sidelying to sit: HOB elevated;Mod assist   Sit to supine: Max assist   General bed mobility comments: assist for L leg off bed and to lift trunk, to supine assist for trunk and both legs; +2 for scooting up in bed  Transfers                 General transfer comment: NT worked on sitting balance, will need +2 for safety and due to pt's fear of falling  Ambulation/Gait                Stairs            Wheelchair Mobility    Modified Rankin (Stroke Patients Only)       Balance  Overall balance assessment: Needs assistance Sitting-balance support: Feet unsupported Sitting balance-Leahy Scale: Poor Sitting balance - Comments: sitting EOB initially leaning to L and needing max cues and mod A for sitting EOB, working to lessen support and pt sat with close S and cues with UE support about 2-3 minutes Postural control: Left lateral lean;Posterior lean                                   Pertinent Vitals/Pain Pain Assessment: Faces Faces Pain Scale: Hurts even more Pain Location: head/incision Pain Descriptors / Indicators: Throbbing Pain Intervention(s): Monitored during session;Repositioned;Limited activity within patient's tolerance    Home Living Family/patient expects to be discharged to:: Private residence Living Arrangements: Spouse/significant other Available Help at Discharge: Family;Available 24 hours/day Type of Home: House Home Access: Stairs to enter Entrance Stairs-Rails: Left Entrance Stairs-Number of Steps: 5 Home Layout: Two level;Bed/bath upstairs;1/2 bath on main level Home Equipment: Walker - 2 wheels;Cane - single point      Prior Function Level of Independence: Independent         Comments: Travesl to [place] to live in summer, walks her dog and does not use assistive device at baseline, but spouse reports shuffling her feet a lot recently     Hand Dominance   Dominant Hand: Right    Extremity/Trunk  Assessment   Upper Extremity Assessment Upper Extremity Assessment: Defer to OT evaluation    Lower Extremity Assessment Lower Extremity Assessment: LLE deficits/detail;RLE deficits/detail RLE Deficits / Details: AROM ankle DF about to neutral, hip flexion at least 3/5, knee extension 3+/5 LLE Deficits / Details: AROM ankle DF about -15 degrees from neurtral, hip flexion at least 3/5, knee extension 4-/5 though reports some pain with testing knee extension LLE Sensation: decreased proprioception    Cervical / Trunk  Assessment Cervical / Trunk Assessment: Other exceptions Cervical / Trunk Exceptions: leaning L  Communication   Communication: Expressive difficulties (dysarthria)  Cognition Arousal/Alertness: Awake/alert Behavior During Therapy: Flat affect;Impulsive Overall Cognitive Status: Impaired/Different from baseline Area of Impairment: Attention;Memory;Following commands;Safety/judgement;Awareness;Problem solving                   Current Attention Level: Sustained Memory: Decreased short-term memory Following Commands: Follows one step commands with increased time Safety/Judgement: Decreased awareness of safety;Decreased awareness of deficits Awareness: Emergent Problem Solving: Slow processing;Difficulty sequencing;Decreased initiation General Comments: impulsive trying to talk and swallow pill though not swallowing correctly, needing redirection to focus on swallowing task first; correcting her spouse about events of fall in the airport reporting he was not there      General Comments General comments (skin integrity, edema, etc.): R drain still in, L drain removed per RN; spouse at the bedside    Exercises General Exercises - Lower Extremity Ankle Circles/Pumps: AROM;10 reps;Both;Supine Long Arc Quad: AROM;Both;5 reps;Seated   Assessment/Plan    PT Assessment Patient needs continued PT services  PT Problem List Decreased strength;Decreased mobility;Decreased safety awareness;Decreased cognition;Decreased balance;Decreased knowledge of use of DME       PT Treatment Interventions DME instruction;Therapeutic activities;Cognitive remediation;Patient/family education;Therapeutic exercise;Functional mobility training;Balance training;Gait training    PT Goals (Current goals can be found in the Care Plan section)  Acute Rehab PT Goals Patient Stated Goal: to get better PT Goal Formulation: With patient/family Time For Goal Achievement: 07/13/20 Potential to Achieve Goals:  Good    Frequency Min 3X/week   Barriers to discharge        Co-evaluation               AM-PAC PT "6 Clicks" Mobility  Outcome Measure Help needed turning from your back to your side while in a flat bed without using bedrails?: A Lot Help needed moving from lying on your back to sitting on the side of a flat bed without using bedrails?: A Lot Help needed moving to and from a bed to a chair (including a wheelchair)?: Total Help needed standing up from a chair using your arms (e.g., wheelchair or bedside chair)?: Total Help needed to walk in hospital room?: Total Help needed climbing 3-5 steps with a railing? : Total 6 Click Score: 8    End of Session   Activity Tolerance: Other (comment) (limited by fear) Patient left: in bed;with call bell/phone within reach;with family/visitor present Nurse Communication: Mobility status PT Visit Diagnosis: Other abnormalities of gait and mobility (R26.89);History of falling (Z91.81);Muscle weakness (generalized) (M62.81);Other symptoms and signs involving the nervous system (R29.898)    Time: 5053-9767 PT Time Calculation (min) (ACUTE ONLY): 29 min   Charges:   PT Evaluation $PT Eval Moderate Complexity: 1 Mod PT Treatments $Therapeutic Activity: 8-22 mins        Magda Kiel, PT Acute Rehabilitation Services HAL[facility]:[phone] [phone] 06/29/2020   [name] 06/29/2020, 7:26 PM

## 2020-06-30 MED ORDER — BETHANECHOL CHLORIDE 10 MG PO TABS
10.0000 mg | ORAL_TABLET | Freq: Three times a day (TID) | ORAL | Status: DC
  Administered 2020-06-30 – 2020-07-08 (×25): 10 mg via ORAL
  Filled 2020-06-30 (×25): qty 1

## 2020-06-30 NOTE — Progress Notes (Signed)
Physical Therapy Treatment Patient Details Name: Amanda Collier MRN: [id number] DOB: 05/06/1946 Today's Date: 06/30/2020    History of Present Illness 74 yo with hx of DM, balance disorder, HTN and B subdural hematomas. Pt has fallen 7 tinmes in last 3 weeks. Underwent evacuation of B hematomas 8/12 and again on 8/14. Pt with B ventriculostomy drains. On 8/2 pt had xrays of B ankles due to pain and pt with L nondisplaced fx of lateral malleolus and nondisplaced fx of lateral malleolus R fibulu; sprian of tibiofibular ligament R ankle    PT Comments    Patient progressing with mobility able to demonstrate sitting balance with S and transferred to chair with aide of the Stedy due to poor anterior weight shift and inability to take steps with festinating gait.  She denies pain in her feet and ankles, but still with some headache.  She remains appropriate for CIR level rehab at d/c.    Follow Up Recommendations  CIR     Equipment Recommendations  Wheelchair (measurements PT)    Recommendations for Other Services       Precautions / Restrictions Precautions Precautions: Fall Precaution Comments: drains removed Required Braces or Orthoses: Other Brace Other Brace: Cam boot L; lace up ankle brace R Restrictions Weight Bearing Restrictions: Yes Other Position/Activity Restrictions: WBAT with braces per pt    Mobility  Bed Mobility Overal bed mobility: Needs Assistance Bed Mobility: Supine to Sit     Supine to sit: Mod assist;HOB elevated     General bed mobility comments: requires increased time and cueing for technique/sequencing; mod assist for scooting forward, pt reaching for UE support to manage trunk but managing LEs with increased time   Transfers Overall transfer level: Needs assistance Equipment used: Rolling walker (2 wheeled) Transfers: Sit to/from [facility] Sit to Stand: Max assist;+2 physical assistance;+2 safety/equipment Stand pivot  transfers: Total assist;+2 physical assistance;+2 safety/equipment       General transfer comment: max assist +2 to power up and steady from EOB with cueing for hand placement and safety, utilized stedy for transfer to recliner due to L lateral and posterior lean  Ambulation/Gait                 Stairs             Wheelchair Mobility    Modified Rankin (Stroke Patients Only)       Balance Overall balance assessment: Needs assistance Sitting-balance support: Feet supported Sitting balance-Leahy Scale: Fair Sitting balance - Comments: min guard to min assist, with slight L lean but stable statically Postural control: Left lateral lean Standing balance support: Bilateral upper extremity supported;During functional activity Standing balance-Leahy Scale: Zero Standing balance comment: requires max assist +2 due to L lateral and posterior lean                             Cognition Arousal/Alertness: Awake/alert Behavior During Therapy: Flat affect;Impulsive Overall Cognitive Status: Impaired/Different from baseline Area of Impairment: Attention;Memory;Following commands;Safety/judgement;Awareness;Problem solving                   Current Attention Level: Sustained Memory: Decreased short-term memory Following Commands: Follows one step commands with increased time;Follows multi-step commands inconsistently Safety/Judgement: Decreased awareness of safety;Decreased awareness of deficits Awareness: Emergent Problem Solving: Slow processing;Difficulty sequencing;Decreased initiation;Requires verbal cues;Requires tactile cues General Comments: patient requires redirection to task throughout session, decreased awareness of deficits and difficulty following mulitple step  commands       Exercises      General Comments General comments (skin integrity, edema, etc.): R drain removed prior to session per RN       Pertinent Vitals/Pain Pain  Assessment: Faces Faces Pain Scale: Hurts a little bit Pain Location: head/incision Pain Descriptors / Indicators: Discomfort Pain Intervention(s): Monitored during session;Repositioned    Home Living                      Prior Function            PT Goals (current goals can now be found in the care plan section) Acute Rehab PT Goals Patient Stated Goal: to get better Progress towards PT goals: Progressing toward goals    Frequency    Min 3X/week      PT Plan Current plan remains appropriate    Co-evaluation PT/OT/SLP Co-Evaluation/Treatment: Yes Reason for Co-Treatment: For patient/therapist safety;Necessary to address cognition/behavior during functional activity;To address functional/ADL transfers PT goals addressed during session: Mobility/safety with mobility;Balance;Proper use of DME OT goals addressed during session: ADL's and self-care      AM-PAC PT "6 Clicks" Mobility   Outcome Measure  Help needed turning from your back to your side while in a flat bed without using bedrails?: A Lot Help needed moving from lying on your back to sitting on the side of a flat bed without using bedrails?: A Lot Help needed moving to and from a bed to a chair (including a wheelchair)?: Total Help needed standing up from a chair using your arms (e.g., wheelchair or bedside chair)?: Total Help needed to walk in hospital room?: Total Help needed climbing 3-5 steps with a railing? : Total 6 Click Score: 8    End of Session Equipment Utilized During Treatment: Gait belt;Other (comment) (camboot and lace up ankle brace) Activity Tolerance: Patient tolerated treatment well Patient left: in chair;with call bell/phone within reach;with chair alarm set Nurse Communication: Need for lift equipment;Mobility status PT Visit Diagnosis: Other abnormalities of gait and mobility (R26.89);History of falling (Z91.81);Muscle weakness (generalized) (M62.81);Other symptoms and signs  involving the nervous system (R29.898)     Time: 9147-8295 PT Time Calculation (min) (ACUTE ONLY): 33 min  Charges:  $Therapeutic Activity: 8-22 mins                     Magda Kiel, PT Acute Rehabilitation Services Pager:[phone] [phone] 06/30/2020    Amanda Collier 06/30/2020, 4:32 PM

## 2020-06-30 NOTE — Progress Notes (Signed)
Occupational Therapy Treatment Patient Details Name: Amanda Collier MRN: [id number] DOB: 05-16-1946 Today's Date: 06/30/2020    History of present illness 74 yo with hx of DM, balance disorder, HTN and B subdural hematomas. Pt has fallen 7 tinmes in last 3 weeks. Underwent evacuation of B hematomas 8/12 and again on 8/14. Pt with B ventriculostomy drains. On 8/2 pt had xrays of B ankles due to pain and pt with L nondisplaced fx of lateral malleolus and nondisplaced fx of lateral malleolus R fibulu; sprian of tibiofibular ligament R ankle   OT comments  Patient supine in bed and agreeable to OT/PT session.  Completing bed mobility with mod assist, sit to stand with RW given max assist +2 and transferred to recliner using steady.  Heavy posterior and L lateral lean in standing, progressing to min guard sitting EOB statically.  She continues to be limited by decreased attention, problem solving and awareness; requires mod-max multimodal throughout session with poor carryover of techniques.  Continue to recommend CIR at this time.    Follow Up Recommendations  CIR;Supervision/Assistance - 24 hour    Equipment Recommendations  3 in 1 bedside commode    Recommendations for Other Services      Precautions / Restrictions Precautions Precautions: Fall Required Braces or Orthoses: Other Brace Other Brace: Cam boot L; lace up ankle brace R Restrictions Weight Bearing Restrictions: Yes Other Position/Activity Restrictions: WBAT with braces per pt       Mobility Bed Mobility Overal bed mobility: Needs Assistance Bed Mobility: Supine to Sit     Supine to sit: Mod assist;HOB elevated     General bed mobility comments: requires increased time and cueing for technique/sequencing; mod assist for scooting forward, pt reaching for UE support to manage trunk but managing LEs with increased time   Transfers Overall transfer level: Needs assistance Equipment used: Rolling walker (2  wheeled) Transfers: Sit to/from [facility] Sit to Stand: Max assist;+2 physical assistance;+2 safety/equipment Stand pivot transfers: Total assist;+2 physical assistance;+2 safety/equipment       General transfer comment: max assist +2 to power up and steady from EOB with cueing for hand placement and safety, utilized stedy for transfer to recliner due to L lateral and posterior lean    Balance Overall balance assessment: Needs assistance Sitting-balance support: No upper extremity supported;Feet supported Sitting balance-Leahy Scale: Fair Sitting balance - Comments: min guard to min assist, with slight L lean but stable statically Postural control: Left lateral lean Standing balance support: Bilateral upper extremity supported;During functional activity Standing balance-Leahy Scale: Zero Standing balance comment: requires max assist +2 due to L lateral and posterior lean                            ADL either performed or assessed with clinical judgement   ADL Overall ADL's : Needs assistance/impaired                     Lower Body Dressing: Total assistance;+2 for safety/equipment;+2 for physical assistance Lower Body Dressing Details (indicate cue type and reason): assist to don shoe and brace, cam boot; sit to stand max assist +2  Toilet Transfer: Total assistance;+2 for physical assistance;+2 for safety/equipment Toilet Transfer Details (indicate cue type and reason): using stedy          Functional mobility during ADLs: Maximal assistance;+2 for physical assistance;+2 for safety/equipment General ADL Comments: patient remains limited by cognition, impaired balance and  weakness      Vision   Additional Comments: further assessment required   Perception     Praxis      Cognition Arousal/Alertness: Awake/alert Behavior During Therapy: Flat affect;Impulsive Overall Cognitive Status: Impaired/Different from baseline Area of  Impairment: Attention;Memory;Following commands;Safety/judgement;Awareness;Problem solving                   Current Attention Level: Sustained Memory: Decreased short-term memory Following Commands: Follows one step commands with increased time;Follows multi-step commands inconsistently Safety/Judgement: Decreased awareness of safety;Decreased awareness of deficits Awareness: Emergent Problem Solving: Slow processing;Difficulty sequencing;Decreased initiation;Requires verbal cues;Requires tactile cues General Comments: patient requires redirection to task throughout session, decreased awareness of deficits and difficulty following mulitple step commands         Exercises     Shoulder Instructions       General Comments R drain removed prior to session per RN     Pertinent Vitals/ Pain       Pain Assessment: Faces Faces Pain Scale: Hurts a little bit Pain Location: head/incision Pain Descriptors / Indicators: Discomfort Pain Intervention(s): Monitored during session  Home Living                                          Prior Functioning/Environment              Frequency  Min 2X/week        Progress Toward Goals  OT Goals(current goals can now be found in the care plan section)  Progress towards OT goals: Progressing toward goals  Acute Rehab OT Goals Patient Stated Goal: to get better OT Goal Formulation: With patient  Plan Discharge plan remains appropriate;Frequency remains appropriate    Co-evaluation    PT/OT/SLP Co-Evaluation/Treatment: Yes Reason for Co-Treatment: For patient/therapist safety;To address functional/ADL transfers   OT goals addressed during session: ADL's and self-care      AM-PAC OT "6 Clicks" Daily Activity     Outcome Measure   Help from another person eating meals?: A Little Help from another person taking care of personal grooming?: A Little Help from another person toileting, which includes using  toliet, bedpan, or urinal?: A Lot Help from another person bathing (including washing, rinsing, drying)?: A Lot Help from another person to put on and taking off regular upper body clothing?: A Little Help from another person to put on and taking off regular lower body clothing?: A Lot 6 Click Score: 15    End of Session Equipment Utilized During Treatment: Gait belt;Rolling walker;Other (comment) (stedy)  OT Visit Diagnosis: Unsteadiness on feet (R26.81);Other abnormalities of gait and mobility (R26.89);Repeated falls (R29.6);Muscle weakness (generalized) (M62.81);Apraxia (R48.2);Other symptoms and signs involving cognitive function;Other symptoms and signs involving the nervous system (R29.898);Pain Pain - part of body: Ankle and joints of foot   Activity Tolerance Patient tolerated treatment well   Patient Left in chair;with call bell/phone within reach;with chair alarm set   Nurse Communication Mobility status;Precautions        Time: 7564-3329 OT Time Calculation (min): 30 min  Charges: OT General Charges $OT Visit: 1 Visit OT Treatments $Self Care/Home Management : 8-22 mins  Jolaine Artist, OT [place] Pager [phone] Office [place] 06/30/2020, 4:14 PM

## 2020-06-30 NOTE — Progress Notes (Signed)
Patient ID: Amanda Collier, female   DOB: Oct 16, 1946, 74 y.o.   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

## 2020-06-30 NOTE — Progress Notes (Signed)
  Speech Language Pathology Treatment: Dysphagia;Cognitive-Linquistic  Patient Details Name: Amanda Collier MRN: [id number] DOB: [birth year] Today's Date: 06/30/2020 Time: 2924-4628 SLP Time Calculation (min) (ACUTE ONLY): 22 min  Assessment / Plan / Recommendation Clinical Impression  Pt was seen during lunch meal, with improved articulation noted. She is also showing more awareness of her L side, asking questions about why that side has been weaker. She showed some anticipatory awareness by requesting her TV to be turned off before session began. With a quieter environment she is sustaining her attention for longer periods of time. Min-Mod cues were provided throughout session for recollection of daily events and new information. She self-fed peaches and thin liquids via straw with a single cough noted. SLP reinforced education about swallowing strategies and current diet textures, assisting her in meal selection for dinner and breakfast. No L pocketing was noted with the peaches, but RN reports some pocketing with pills and pt says this has happened some with solids today too. Will continue with mechanical soft diet and thin liquids for now.   HPI HPI: 74 yo with hx of DM, balance disorder, HTN and B subdural hematomas. Pt has fallen 7 times in last 3 weeks. Underwent evacuation of B hematomas 8/12 and again on 8/14. Pt with B ventriculostomy drains. On 8/2 pt had xrays of B ankles due to pain and pt with L nondisplaced fx of lateral malleolus and nondisplaced fx of lateral malleolus R fibula; sprain of tibiofibular ligament R ankle.      SLP Plan  Continue with current plan of care       Recommendations  Diet recommendations: Dysphagia 3 (mechanical soft);Thin liquid Liquids provided via: Straw;Cup Medication Administration: Whole meds with puree Supervision: Staff to assist with self feeding;Full supervision/cueing for compensatory strategies Compensations: Slow rate;Small  sips/bites;Minimize environmental distractions;Lingual sweep for clearance of pocketing Postural Changes and/or Swallow Maneuvers: Seated upright 90 degrees                Oral Care Recommendations: Oral care BID Follow up Recommendations: Inpatient Rehab SLP Visit Diagnosis: Cognitive communication deficit (R41.841);Dysphagia, oral phase (R13.11) Plan: Continue with current plan of care       GO                Osie Bond., M.A. [place] Acute Rehabilitation Services Pager [phone] Office [phone]  06/30/2020, 1:44 PM

## 2020-06-30 NOTE — Progress Notes (Signed)
Inpatient Rehab Admissions Coordinator:   At this time, we are recommending a CIR consult.  If pt would like to be considered, please place an IP Rehab MD consult order.   Shann Medal, PT, DPT Admissions Coordinator [phone] 06/30/20  7:54 PM

## 2020-07-01 NOTE — Progress Notes (Signed)
Patient ID: Amanda Collier, female   DOB: 02-10-1946, 74 y.o.   MRN: [id number] BP (!) 103/57   Pulse 68   Temp 99.1 F (37.3 C) (Oral)   Resp 14   Wt 74.8 kg   LMP 11/14/2003   SpO2 96%   BMI 28.32 kg/m  Alert, oriented x 4, speech is clear  wounds are clean, and dry No signs of infection Rehab consulted

## 2020-07-01 NOTE — Consult Note (Signed)
Physical Medicine and Rehabilitation Consult Reason for Consult: Decreased functional mobility Referring Physician: Dr. Christella Noa  HPI: Amanda Collier is a 74 y.o. right-handed female with history of hypertension, diabetes mellitus, and alcohol use as well as nondisplaced fracture of left lateral malleolus and right fibula sprain of tibiofibular ligament after recent fall 06/14/2020.  History taken from chart review, patient, and husband.  Patient lives with spouse.  Two-level home bed and bath upstairs 5 steps to entry.  Reportedly independent prior to admission.  She presented on 06/26/2020 with multiple falls and gait abnormality.  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  Negative for acute infarct.  CT cervical thoracic spine with no fracture.  Patient underwent craniotomy hematoma evacuation of subdural hematoma 06/24/2020 per Dr. Christella Noa.  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  Maintained on Depakote for seizure prophylaxis.  Patient was cleared to begin subcutaneous heparin for DVT prophylaxis.  Hospital course further complicated by urinary retention, urecholine initiated.  Tolerating mechanical soft diet.  Patient is weightbearing as tolerated lower extremities Cam boot on left and lace up ankle brace on right.  Therapy evaluations completed with recommendations of physical medicine rehab consult.  Patient continues to undergo neurosurgical work-up with?  Further surgical intervention per husband.  Review of Systems  Constitutional: Positive for malaise/fatigue. Negative for chills and fever.  HENT: Negative for hearing loss.   Eyes: Negative for blurred vision and double vision.  Respiratory: Negative for cough and shortness of breath.   Cardiovascular: Negative for chest pain and leg swelling.    Gastrointestinal: Positive for constipation. Negative for heartburn, nausea and vomiting.  Genitourinary: Negative for dysuria, flank pain and hematuria.  Musculoskeletal: Positive for falls, joint pain and myalgias.  Skin: Negative for rash.  Neurological: Positive for weakness and headaches. Negative for sensory change and speech change.  All other systems reviewed and are negative.  Past Medical History:  Diagnosis Date  . Headache syndrome 12/18/2016  . High blood pressure   . Hypertension    followed by Dr Gillian Shields medications  . Leg swelling    Hx of   Past Surgical History:  Procedure Laterality Date  . CRANIOTOMY Bilateral 06/24/2020   Procedure: Bilateral craniotomy for evacuation of subdural hematoma;  Surgeon: Ashok Pall, MD;  Location: [place];  Service: Neurosurgery;  Laterality: Bilateral;  . CRANIOTOMY Right 06/26/2020   Procedure: CRANIOTOMY HEMATOMA EVACUATION SUBDURAL;  Surgeon: Ashok Pall, MD;  Location: [place];  Service: Neurosurgery;  Laterality: Right;  . NO PAST SURGERIES     Family History  Adopted: Yes  Problem Relation Age of Onset  . Heart attack Maternal Uncle        ? unknown if correct/patient adopted   Social History:  reports that she has never smoked. She has never used smokeless tobacco. She reports current alcohol use of about 7.0 standard drinks of alcohol per week. She reports that she does not use drugs. Allergies:  Allergies  Allergen Reactions  . Codeine Nausea Only  . Lexapro [Escitalopram Oxalate] Other (See Comments)    GI upset  . Lisinopril Cough  . Lotensin [Benazepril Hcl] Other (See Comments)    Headaches   Medications Prior to Admission  Medication Sig Dispense Refill  . acetaminophen (TYLENOL) 500 MG tablet Take 1,000 mg by mouth every 6 (six) hours as  needed (for pain).    Marland Kitchen aspirin 81 MG EC tablet Take 81 mg by mouth in the morning and at bedtime. Swallow whole.    Marland Kitchen atorvastatin (LIPITOR) 10 MG tablet Take  10 mg by mouth daily.    . clonazePAM (KLONOPIN) 0.5 MG tablet Take 0.5 mg by mouth at bedtime as needed (for sleep).     . divalproex (DEPAKOTE ER) 250 MG 24 hr tablet Take 250-750 mg by mouth See admin instructions. Take 250 mg by mouth in the morning and 750 mg at bedtime    . losartan (COZAAR) 100 MG tablet Take 100 mg by mouth in the morning.   1  . metFORMIN (GLUCOPHAGE-XR) 500 MG 24 hr tablet Take 500 mg by mouth daily before supper.    . topiramate (TOPAMAX) 50 MG tablet Take 1.5 tablets (75 mg total) by mouth at bedtime. 120 tablet 0  . Vitamin D, Ergocalciferol, (DRISDOL) 50000 UNITS CAPS Take 50,000 Units by mouth every Wednesday.     . zolpidem (AMBIEN) 10 MG tablet Take 10 mg by mouth at bedtime as needed for sleep.    . Divalproex Sodium (DEPAKOTE PO) Take 250 mg by mouth 3 (three) times daily. Can take up to three times daily  (Patient not taking: Reported on 06/23/2020)    . Docusate Sodium (COLACE PO) Take 1 tablet by mouth at bedtime. (Patient not taking: Reported on 06/23/2020)    . hydrochlorothiazide (HYDRODIURIL) 25 MG tablet Take 25 mg by mouth daily.    . Naproxen Sodium (ALEVE) 220 MG CAPS Take by mouth daily. (Patient not taking: Reported on 06/23/2020)      Home: Home Living Family/patient expects to be discharged to:: Private residence Living Arrangements: Spouse/significant other Available Help at Discharge: Family, Available 24 hours/day Type of Home: House Home Access: Stairs to enter [employer] of Steps: 5 Entrance Stairs-Rails: Left Home Layout: Two level, Bed/bath upstairs, 1/2 bath on main level Alternate Level Stairs-Number of Steps: flight Alternate Level Stairs-Rails: Right, Left (rail half way up one side then switches to other side) Bathroom Shower/Tub: Chiropodist: Handicapped height Bathroom Accessibility: No Home Equipment: Environmental consultant - 2 wheels, Cane - single point  Lives With: Spouse  Functional History: Prior  Function Level of Independence: Independent Comments: Travesl to [place] to live in summer, walks her dog and does not use assistive device at baseline, but spouse reports shuffling her feet a lot recently Functional Status:  Mobility: Bed Mobility Overal bed mobility: Needs Assistance Bed Mobility: Rolling, Sidelying to Sit Rolling: Min assist Sidelying to sit: Mod assist, HOB elevated Supine to sit: Mod assist, HOB elevated Sit to supine: Max assist General bed mobility comments: cues for rail use, encouragement as she repeats that her arms are weak; guided legs off the bed and some assist to lift trunk increased time Transfers Overall transfer level: Needs assistance Equipment used: Rolling walker (2 wheeled) Transfer via Lift Equipment: Stedy Transfers: Sit to/from Stand, [employer] Transfers Sit to Stand: Mod assist, +2 physical assistance Stand pivot transfers: Total assist, +2 physical assistance General transfer comment: mod A up to stand to [place], pt needing cues to extend trunk and for midline positioning as leaning to R; used Stedy to transition to recliner      ADL: ADL Overall ADL's : Needs assistance/impaired Eating/Feeding: Minimal assistance Eating/Feeding Details (indicate cue type and reason): difficulty using L hand to assist wtih packages/ cutting Grooming: Minimal assistance Upper Body Bathing: Minimal assistance, Sitting Lower Body  Bathing: Moderate assistance, Bed level Upper Body Dressing : Moderate assistance, Bed level Lower Body Dressing: Total assistance, +2 for safety/equipment, +2 for physical assistance Lower Body Dressing Details (indicate cue type and reason): assist to don shoe and brace, cam boot; sit to stand max assist +2  Toilet Transfer: Total assistance, +2 for physical assistance, +2 for safety/equipment Toilet Transfer Details (indicate cue type and reason): using stedy  Functional mobility during ADLs: Maximal assistance, +2 for  physical assistance, +2 for safety/equipment General ADL Comments: patient remains limited by cognition, impaired balance and weakness   Cognition: Cognition Overall Cognitive Status: Impaired/Different from baseline Arousal/Alertness: Awake/alert Orientation Level: Oriented X4 Attention: Sustained Sustained Attention: Impaired Sustained Attention Impairment: Verbal basic, Functional basic Memory: Impaired Memory Impairment: Retrieval deficit, Decreased short term memory Decreased Short Term Memory: Verbal basic Awareness: Impaired Awareness Impairment: Intellectual impairment, Emergent impairment Problem Solving: Impaired Problem Solving Impairment: Functional basic Safety/Judgment: Impaired Cognition Arousal/Alertness: Awake/alert Behavior During Therapy: Anxious Overall Cognitive Status: Impaired/Different from baseline Area of Impairment: Attention, Memory, Following commands, Safety/judgement, Problem solving Current Attention Level: Sustained Memory: Decreased short-term memory Following Commands: Follows one step commands consistently, Follows one step commands with increased time Safety/Judgement: Decreased awareness of safety Awareness: Emergent Problem Solving: Slow processing, Requires verbal cues General Comments: repeating herself frequently information already relayed  Blood pressure (!) 142/68, pulse 68, temperature 98.2 F (36.8 C), temperature source Oral, resp. rate 14, weight 74.8 kg, last menstrual period 11/14/2003, SpO2 100 %. Physical Exam Vitals reviewed.  Constitutional:      General: She is not in acute distress.    Appearance: She is obese.  HENT:     Head:     Comments: Bilateral craniotomy sites.  Normocephalic.    Right Ear: External ear normal.     Left Ear: External ear normal.     Nose: Nose normal.  Eyes:     General:        Right eye: No discharge.        Left eye: No discharge.     Extraocular Movements: Extraocular movements  intact.  Cardiovascular:     Rate and Rhythm: Normal rate and regular rhythm.  Pulmonary:     Effort: Pulmonary effort is normal. No respiratory distress.     Breath sounds: Normal breath sounds. No stridor.  Abdominal:     General: Abdomen is flat. Bowel sounds are normal. There is no distension.  Musculoskeletal:     Cervical back: Normal range of motion and neck supple.     Comments: No edema or tenderness in extremities  Skin:    Comments: Bilateral cranial staples CDI  Neurological:     Mental Status: She is alert.     Comments: Alert and oriented  she does exhibit some decrease in awareness.   Follows simple commands. Motor: Bilateral upper extremities: 5/5 proximal distal Bilateral lower extremities: Hip flexion, knee extension 3/5 (some pain patient), ankle dorsiflexion 4/5 Sensation intact light touch  Psychiatric:        Mood and Affect: Mood normal.        Behavior: Behavior normal.     No results found for this or any previous visit (from the past 24 hour(s)). No results found.  Assessment/Plan: Diagnosis: SDH Labs independently reviewed.  Records reviewed and summated above.  1. Does the need for close, 24 hr/day medical supervision in concert with the patient's rehab needs make it unreasonable for this patient to be served in a less intensive setting? Yes  2. Co-Morbidities requiring supervision/potential complications: HTN (monitor and provide prns in accordance with increased physical exertion and pain), DM with hyperglycemia (Monitor in accordance with exercise and adjust meds as necessary), alcohol use, nondisplaced fracture of left lateral malleolus, ?cervical stenosis (?further surgical intervention per neurosurgery), and right fibula sprain of tibiofibular ligament, hypokalemia (continue to monitor and replete as necessary) 3. Due to bladder management, bowel management, safety, skin/wound care, disease management, medication administration, pain management and  patient education, does the patient require 24 hr/day rehab nursing? Yes 4. Does the patient require coordinated care of a physician, rehab nurse, therapy disciplines of PT/OT to address physical and functional deficits in the context of the above medical diagnosis(es)? Yes Addressing deficits in the following areas: balance, endurance, locomotion, strength, transferring, bathing, dressing, toileting and psychosocial support 5. Can the patient actively participate in an intensive therapy program of at least 3 hrs of therapy per day at least 5 days per week? Yes 6. The potential for patient to make measurable gains while on inpatient rehab is excellent 7. Anticipated functional outcomes upon discharge from inpatient rehab are min assist and mod assist  with PT, min assist and mod assist with OT, n/a with SLP. 8. Estimated rehab length of stay to reach the above functional goals is: 17-21 days. 9. Anticipated discharge destination: Home 10. Overall Rehab/Functional Prognosis: good  RECOMMENDATIONS: This patient's condition is appropriate for continued rehabilitative care in the following setting: CIR if no further surgical intervention planned by neurosurgery. Patient has agreed to participate in recommended program. Yes Note that insurance prior authorization may be required for reimbursement for recommended care.  Comment: Rehab Admissions Coordinator to follow up.  I have personally performed a face to face diagnostic evaluation, including, but not limited to relevant history and physical exam findings, of this patient and developed relevant assessment and plan.  Additionally, I have reviewed and concur with the physician assistant's documentation above.   Delice Lesch, MD, ABPMR Lavon Paganini Angiulli, PA-C 07/02/2020

## 2020-07-02 ENCOUNTER — Institutional Professional Consult (permissible substitution): Payer: Medicare Other | Admitting: Neurology

## 2020-07-02 DIAGNOSIS — T1490XA Injury, unspecified, initial encounter: Secondary | ICD-10-CM

## 2020-07-02 DIAGNOSIS — Z789 Other specified health status: Secondary | ICD-10-CM

## 2020-07-02 DIAGNOSIS — S065XAA Traumatic subdural hemorrhage with loss of consciousness status unknown, initial encounter: Secondary | ICD-10-CM

## 2020-07-02 DIAGNOSIS — I6203 Nontraumatic chronic subdural hemorrhage: Secondary | ICD-10-CM

## 2020-07-02 DIAGNOSIS — S065X9A Traumatic subdural hemorrhage with loss of consciousness of unspecified duration, initial encounter: Secondary | ICD-10-CM

## 2020-07-02 DIAGNOSIS — M4802 Spinal stenosis, cervical region: Secondary | ICD-10-CM

## 2020-07-02 DIAGNOSIS — E1165 Type 2 diabetes mellitus with hyperglycemia: Secondary | ICD-10-CM

## 2020-07-02 DIAGNOSIS — F1010 Alcohol abuse, uncomplicated: Secondary | ICD-10-CM

## 2020-07-02 MED ORDER — DIVALPROEX SODIUM 125 MG PO CSDR
750.0000 mg | DELAYED_RELEASE_CAPSULE | Freq: Every day | ORAL | Status: DC
  Administered 2020-07-03 – 2020-07-07 (×6): 750 mg via ORAL
  Filled 2020-07-02 (×6): qty 6

## 2020-07-02 NOTE — Progress Notes (Signed)
Patient ID: Amanda Collier, female   DOB: 06-Nov-1946, 74 y.o.   MRN: [id number] BP (!) 124/55 (BP Location: Right Arm)   Pulse 74   Temp 98.1 F (36.7 C) (Oral)   Resp 15   Wt 74.8 kg   LMP 11/14/2003   SpO2 100%   BMI 28.32 kg/m  Alert and oriented x 4, speech is clear and fluent Moving all extremities, dexterity still impaired left hand Wounds are clean, and dry Still waiting on rehab. There is no further workup necessary and she is clear for rehabilitation.  Cervical decompression was never planned for this hospitalization, if ever at all.  Overall improved.

## 2020-07-02 NOTE — Progress Notes (Addendum)
Inpatient Rehabilitation Admissions Coordinator  I met with patient and her spouse at bedside . We discussed goals and expectations of a possible inpt rehab admit. Spouse has numerous questions concerning further imaging to evaluate what proceeded in making patient fall initially 4 weeks ago with steady functional decline. I referred him to clarify those discussions  with Dr. Christella Noa and I secure chat Dr. Christella Noa with spouse's concerns. I will have my partner, Clemens Catholic follow up over the weekend. She can be reached at [phone].  Danne Baxter, RN, MSN Rehab Admissions Coordinator [phone] 07/02/2020 1:55 PM

## 2020-07-02 NOTE — Progress Notes (Signed)
Pt taken to [address]. Report given to [facility]. Pt called her husband Merry Proud and notified him of room number. Possessions transferred include One phone, one phone charger, newspaper, Book, cup with straw, glasses, chapstick, 3 bags/ backpacks, one hard foot boot, one soft foot boot and one shoe.

## 2020-07-02 NOTE — Progress Notes (Signed)
Physical Therapy Treatment Patient Details Name: Amanda Collier MRN: [id number] DOB: 1946/04/12 Today's Date: 07/02/2020    History of Present Illness 74 yo with hx of DM, balance disorder, HTN and B subdural hematomas. Pt has fallen 7 tinmes in last 3 weeks. Underwent evacuation of B hematomas 8/12 and again on 8/14. Pt with B ventriculostomy drains. On 8/2 pt had xrays of B ankles due to pain and pt with L nondisplaced fx of lateral malleolus and nondisplaced fx of lateral malleolus R fibulu; sprian of tibiofibular ligament R ankle    PT Comments    Patient progressing with mobility this session able to stand easier from bed and participate in seated exercises.  She is slightly less anxious as had previous experience with Stedy, but remains doubtful of her abilities.  Feel she will benefit from CIR level rehab prior to d/c home.  Spouse present and supportive.   Follow Up Recommendations  CIR     Equipment Recommendations  Wheelchair (measurements PT)    Recommendations for Other Services       Precautions / Restrictions Precautions Precautions: Fall Required Braces or Orthoses: Other Brace Other Brace: Cam boot L; lace up ankle brace R Restrictions Other Position/Activity Restrictions: WBAT with braces per pt    Mobility  Bed Mobility Overal bed mobility: Needs Assistance Bed Mobility: Rolling;Sidelying to Sit Rolling: Min assist Sidelying to sit: Mod assist;HOB elevated       General bed mobility comments: cues for rail use, encouragement as she repeats that her arms are weak; guided legs off the bed and some assist to lift trunk increased time  Transfers Overall transfer level: Needs assistance   Transfers: Sit to/from Stand;Stand Pivot Transfers Sit to Stand: Mod assist;+2 physical assistance Stand pivot transfers: Total assist;+2 physical assistance       General transfer comment: mod A up to stand to [place], pt needing cues to extend trunk and for midline  positioning as leaning to R; used Stedy to transition to Psychologist, counselling [place] (Stroke Patients Only)       Balance Overall balance assessment: Needs assistance   Sitting balance-Leahy Scale: Poor Sitting balance - Comments: UE support needed for balance, R lateral lean Postural control: Right lateral lean Standing balance support: Bilateral upper extremity supported Standing balance-Leahy Scale: Poor Standing balance comment: standing with Stedy and min A                            Cognition Arousal/Alertness: Awake/alert Behavior During Therapy: Anxious Overall Cognitive Status: Impaired/Different from baseline Area of Impairment: Attention;Memory;Following commands;Safety/judgement;Problem solving                   Current Attention Level: Sustained Memory: Decreased short-term memory Following Commands: Follows one step commands consistently;Follows one step commands with increased time Safety/Judgement: Decreased awareness of safety   Problem Solving: Slow processing;Requires verbal cues General Comments: repeating herself frequently information already relayed      Exercises General Exercises - Upper Extremity Chair Push Up: Strengthening;Both;10 reps;Seated General Exercises - Lower Extremity Ankle Circles/Pumps: AROM;10 reps;Both;Seated Long Arc Quad: AROM;Both;Seated;10 reps (w/ 3 second hold) Hip Flexion/Marching: Strengthening;Both;10 reps;Seated Other Exercises Other Exercises: seated trunk flex/ext with arms crossed over chest    General  Comments General comments (skin integrity, edema, etc.): donned camboot on L And lace up ankle brace and shoe on R      Pertinent Vitals/Pain Pain Assessment: Faces Faces Pain Scale: Hurts little more Pain Location: back Pain Descriptors / Indicators: Sore Pain Intervention(s): Monitored during  session;Repositioned    Home Living                      Prior Function            PT Goals (current goals can now be found in the care plan section) Progress towards PT goals: Progressing toward goals    Frequency    Min 3X/week      PT Plan Current plan remains appropriate    Co-evaluation              AM-PAC PT "6 Clicks" Mobility   Outcome Measure  Help needed turning from your back to your side while in a flat bed without using bedrails?: A Little Help needed moving from lying on your back to sitting on the side of a flat bed without using bedrails?: A Lot Help needed moving to and from a bed to a chair (including a wheelchair)?: Total Help needed standing up from a chair using your arms (e.g., wheelchair or bedside chair)?: A Lot Help needed to walk in hospital room?: Total Help needed climbing 3-5 steps with a railing? : Total 6 Click Score: 10    End of Session Equipment Utilized During Treatment: Gait belt;Other (comment) (camboot and L ankle brace) Activity Tolerance: Patient tolerated treatment well Patient left: in chair;with family/visitor present;with call bell/phone within reach Nurse Communication: Need for lift equipment;Mobility status PT Visit Diagnosis: Other abnormalities of gait and mobility (R26.89);History of falling (Z91.81);Muscle weakness (generalized) (M62.81);Other symptoms and signs involving the nervous system (R29.898)     Time: 8032-1224 PT Time Calculation (min) (ACUTE ONLY): 27 min  Charges:  $Therapeutic Exercise: 8-22 mins $Therapeutic Activity: 8-22 mins                     Amanda Collier, PT Acute Rehabilitation Services [phone] Office:[phone] 07/02/2020    [name] 07/02/2020, 10:55 AM

## 2020-07-02 NOTE — PMR Pre-admission (Signed)
PMR Admission Coordinator Pre-Admission Assessment  Patient: Amanda Collier is an 74 y.o., female MRN: [id number] DOB: May 07, 1946 Height: 5\' 4"  (162.6 cm) Weight: 74.8 kg              Insurance Information HMO:     PPO:      PCP:      IPA:      80/20:      OTHER:  PRIMARY: Medicare a and b      Policy#: [id number]      Subscriber: pt Benefits:  Phone #: passport one online     Name: 8/20 Eff. Date: 11/13/2010     Deduct: $8416      Out of Pocket Max: none      Life Max: none  CIR: 1005      SNF: 20 full days Outpatient: 80%     Co-Pay: [place]: 1005      Co-Pay: none DME: 80%     Co-Pay: 20% Providers: pt choice  SECONDARY: AARP supplement      Policy#: [id number]  Financial Counselor:       Phone#:   The "Data Collection Information Summary" for patients in Inpatient Rehabilitation Facilities with attached "Privacy Act [place] Records" was provided and verbally reviewed with: Patient and Family  Emergency Contact Information Contact Information    Name Relation Home Work Mobile   Shavontae, [place] [phone]  [phone]     Current Medical History  Patient Admitting Diagnosis: SDH  History of Present Illness:Amanda Collier is a 74 year old right-handed female with history of hypertension, diabetes mellitus and alcohol use as well as left nondisplaced fracture of left lateral malleolus and nondisplaced fracture of lateral malleolus right fibula sprain of tibiofibular ligament right ankle after recent fall 06/14/2020 followed by Dr. Louanne Skye.   Presented 06/26/2020 with multiple falls and gait difficulties.  Recent MRI and imaging revealed large subacute subdural hematoma bilaterally with significant mass-effect on both cerebral hemispheres without midline shift.  Negative for acute infarct.  CT cervical thoracic spine with no fracture.  Patient underwent craniotomy hematoma evacuation of subdural hematoma 06/24/2020 per Dr. Christella Noa.  Postoperative  follow-up CT and imaging showed recurrent subdural hematoma on the right side undergoing craniotomy hematoma evacuation 06/26/2020.  MRI of lumbar spine completed due to low back/sacral pain and lower extremity weakness 07/04/2020 showed subdural hemorrhage extending through the length of the visualized spine mainly dorsal and most notable in the lower thoracic segments and at the L5-S1 level.  The distal cord mildly deflected by hemorrhage but no cord signal abnormality identified.  Moderate flattening of the thecal sac at L5-S1 due to a disc bulge and subdural hemorrhage reviewed by neurology services advised conservative care consideration for trial of Neurontin.  Maintained on Depakote/Keppra for seizure prophylaxis.  Patient was cleared to begin subcutaneous heparin for DVT prophylaxis as well as resuming aspirin as prior to admission.  Bouts of urinary retention maintained on Urecholine.  Tolerating a regular consistency diet.  Patient is weightbearing as tolerated lower extremities Cam boot on left and lace up ankle brace on right.  Past Medical History  Past Medical History:  Diagnosis Date  . Headache syndrome 12/18/2016  . High blood pressure   . Hypertension    followed by Dr Gillian Shields medications  . Leg swelling    Hx of    Family History  family history includes Heart attack in her maternal uncle. She was adopted.  Prior Rehab/Hospitalizations:  Has the patient  had prior rehab or hospitalizations prior to admission? Yes  Has the patient had major surgery during 100 days prior to admission? Yes  Current Medications   Current Facility-Administered Medications:  .  0.9 %  sodium chloride infusion, 250 mL, Intravenous, PRN, Ashok Pall, MD .  0.9 %  sodium chloride infusion, , Intravenous, Continuous, Ashok Pall, MD, Stopped at 06/30/20 2215 .  acetaminophen (TYLENOL) tablet 650 mg, 650 mg, Oral, Q6H PRN, 650 mg at 06/28/20 2117 **OR** acetaminophen (TYLENOL)  suppository 650 mg, 650 mg, Rectal, Q6H PRN, Ashok Pall, MD .  aspirin EC tablet 81 mg, 81 mg, Oral, BID, Ashok Pall, MD, 81 mg at 07/08/20 0939 .  atorvastatin (LIPITOR) tablet 10 mg, 10 mg, Oral, Daily, Ashok Pall, MD, 10 mg at 07/08/20 2376 .  bethanechol (URECHOLINE) tablet 10 mg, 10 mg, Oral, TID, Ashok Pall, MD, 10 mg at 07/08/20 0937 .  Chlorhexidine Gluconate Cloth 2 % PADS 6 each, 6 each, Topical, Daily, Ashok Pall, MD, 6 each at 07/07/20 1153 .  clonazePAM (KLONOPIN) tablet 0.5 mg, 0.5 mg, Oral, QHS PRN, Ashok Pall, MD, 0.5 mg at 07/07/20 2146 .  divalproex (DEPAKOTE ER) 24 hr tablet 250 mg, 250 mg, Oral, Daily, Ashok Pall, MD, 250 mg at 07/08/20 2831 .  divalproex (DEPAKOTE SPRINKLE) capsule 750 mg, 750 mg, Oral, QHS, Cabbell, Kyle, MD, 750 mg at 07/07/20 2146 .  heparin injection 5,000 Units, 5,000 Units, Subcutaneous, Q8H, Ashok Pall, MD, 5,000 Units at 07/08/20 0535 .  HYDROcodone-acetaminophen (NORCO/VICODIN) 5-325 MG per tablet 1-2 tablet, 1-2 tablet, Oral, Q4H PRN, Ashok Pall, MD, 2 tablet at 07/08/20 [phone] .  labetalol (NORMODYNE) injection 10-40 mg, 10-40 mg, Intravenous, Q10 min PRN, Ashok Pall, MD .  levETIRAcetam (KEPPRA) tablet 500 mg, 500 mg, Oral, BID, [place], Kimberly B, RPH, 500 mg at 07/08/20 0938 .  losartan (COZAAR) tablet 100 mg, 100 mg, Oral, Daily, Ashok Pall, MD, 100 mg at 07/08/20 1607 .  MEDLINE mouth rinse, 15 mL, Mouth Rinse, BID, Ashok Pall, MD, 15 mL at 07/08/20 0945 .  naloxone [facility]) injection 0.08 mg, 0.08 mg, Intravenous, PRN, Ashok Pall, MD .  ondansetron (ZOFRAN) tablet 4 mg, 4 mg, Oral, Q6H PRN **OR** ondansetron (ZOFRAN) injection 4 mg, 4 mg, Intravenous, Q6H PRN, Ashok Pall, MD, 4 mg at 07/08/20 0030 .  pantoprazole (PROTONIX) EC tablet 40 mg, 40 mg, Oral, QHS, Ashok Pall, MD, 40 mg at 07/07/20 2147 .  senna-docusate (Senokot-S) tablet 1 tablet, 1 tablet, Oral, BID, Ashok Pall, MD, 1 tablet at 07/07/20  1058 .  sodium chloride flush (NS) 0.9 % injection 3 mL, 3 mL, Intravenous, Q12H, Ashok Pall, MD, 3 mL at 07/08/20 0945 .  sodium chloride flush (NS) 0.9 % injection 3 mL, 3 mL, Intravenous, PRN, Ashok Pall, MD, 3 mL at 07/06/20 1002 .  topiramate (TOPAMAX) tablet 75 mg, 75 mg, Oral, QHS, Ashok Pall, MD, 75 mg at 07/07/20 2146 .  Vitamin D (Ergocalciferol) (DRISDOL) capsule 50,000 Units, 50,000 Units, Oral, Q Wed, [place], Marylyn Ishihara, MD, 50,000 Units at 07/07/20 1152 .  zolpidem (AMBIEN) tablet 5 mg, 5 mg, Oral, QHS PRN, Ashok Pall, MD, 5 mg at 07/08/20 0030  Patients Current Diet:  Diet Order            Diet regular Room service appropriate? Yes; Fluid consistency: Thin  Diet effective now                 Precautions / Restrictions Precautions Precautions: Fall Precaution Comments: drains  removed Other Brace: Cam boot L; lace up ankle brace R; has sneaker for rt foot Restrictions Weight Bearing Restrictions: Yes RLE Weight Bearing: Weight bearing as tolerated LLE Weight Bearing: Weight bearing as tolerated Other Position/Activity Restrictions: WBAT with braces per pt   Has the patient had 2 or more falls or a fall with injury in the past year?Yes  Initial fall 06/03/2020. Now has had 7 falls pta  Prior Activity Level Community (5-7x/wk): completely independent until 4 weeks pta when she had initial fall; drove  Prior Functional Level Prior Function Level of Independence: Independent Comments: Travesl to [place] to live in summer, walks her dog and does not use assistive device at baseline, but spouse reports shuffling her feet a lot recently  Self Care: Did the patient need help bathing, dressing, using the toilet or eating?  Independent  Indoor Mobility: Did the patient need assistance with walking from room to room (with or without device)? Independent  Stairs: Did the patient need assistance with internal or external stairs (with or without device)?  Independent  Functional Cognition: Did the patient need help planning regular tasks such as shopping or remembering to take medications? Independent  Home Assistive Devices / Equipment Home Assistive Devices/Equipment: Environmental consultant (specify type), Eyeglasses Home Equipment: Walker - 2 wheels, Cane - single point  Prior Device Use: Indicate devices/aids used by the patient prior to current illness, exacerbation or injury? None of the above  Current Functional Level Cognition  Arousal/Alertness: Awake/alert Overall Cognitive Status: Impaired/Different from baseline Current Attention Level: Sustained Orientation Level: Oriented X4 Following Commands: Follows one step commands consistently, Follows one step commands with increased time Safety/Judgement: Decreased awareness of safety, Decreased awareness of deficits General Comments: Patient can give detailed history of hospital events (including dates); decr awareness of ankle deficits as continues to ask to not wear boot/brace because they make it hard to balance (true however bil ankle fractures) Attention: Sustained Sustained Attention: Impaired Sustained Attention Impairment: Verbal basic, Functional basic Memory: Impaired Memory Impairment: Retrieval deficit, Decreased short term memory Decreased Short Term Memory: Verbal basic Awareness: Impaired Awareness Impairment: Intellectual impairment, Emergent impairment Problem Solving: Impaired Problem Solving Impairment: Functional basic Safety/Judgment: Impaired    Extremity Assessment (includes Sensation/Coordination)  Upper Extremity Assessment: Defer to OT evaluation LUE Deficits / Details: overall weaker than R; apparent motor planning/sensory motor deficits; trying to pick up cover adn she didn't have voer in hand;poor in-hand manipulation skills; clumsy hand; LUE Sensation: decreased light touch, decreased proprioception LUE Coordination: decreased fine motor (poor disdiadikokenesis)   Lower Extremity Assessment: LLE deficits/detail, RLE deficits/detail RLE Deficits / Details: AROM ankle DF about to neutral, hip flexion at least 3/5, knee extension 3+/5 LLE Deficits / Details: AROM ankle DF about -15 degrees from neurtral, hip flexion at least 3/5, knee extension 4-/5 though reports some pain with testing knee extension LLE Sensation: decreased proprioception    ADLs  Overall ADL's : Needs assistance/impaired Eating/Feeding: Minimal assistance Eating/Feeding Details (indicate cue type and reason): difficulty using L hand to assist wtih packages/ cutting Grooming: Set up, Bed level, Oral care, Wash/dry face, Wash/dry hands Upper Body Bathing: Minimal assistance, Sitting Lower Body Bathing: Moderate assistance, Bed level Upper Body Dressing : Moderate assistance, Bed level Lower Body Dressing: Total assistance, +2 for safety/equipment, +2 for physical assistance Lower Body Dressing Details (indicate cue type and reason): assist to don shoe and brace, cam boot; sit to stand max assist +2  Toilet Transfer: Total assistance, +2 for physical assistance, +2 for safety/equipment  Toilet Transfer Details (indicate cue type and reason): using stedy  Functional mobility during ADLs: Maximal assistance, +2 for physical assistance, +2 for safety/equipment General ADL Comments: limited session due to pain and not wanting to attempt to get EOB due to pain    Mobility  Overal bed mobility: Needs Assistance Bed Mobility: Rolling, Sidelying to Sit, Sit to Sidelying Rolling: Min assist (with rail rt and lt) Sidelying to sit: Mod assist, +2 for physical assistance, HOB elevated (HOB 20) Supine to sit: Mod assist, HOB elevated Sit to supine: Max assist Sit to sidelying: Mod assist, +2 for safety/equipment General bed mobility comments: Pt with HOB 20 degrees trying to eat breakfast; explained plan to get as close to EOB as possible (in sidelying)and with bed elevated, lower legs over EOB  as simultaneously raising torso with intention to minimize hip/trunk flexion to minimize pain; pt agreeable, completed without reports of pain or wincing    Transfers  Overall transfer level: Needs assistance Equipment used: Rolling walker (2 wheeled) Transfer via Lift Equipment: Stedy Transfers: Sit to/from Stand Sit to Stand: Mod assist, +2 physical assistance, From elevated surface Stand pivot transfers: Total assist, +2 physical assistance General transfer comment: mod A up to stand with pt leaning posteriorly against bed, pt needing cues to extend hips; stood for 3 minutes with assisted weight-shifting left and right (and continued assist for anterior wt-shfit over BOS    Ambulation / Gait / Stairs / Wheelchair Mobility  Ambulation/Gait General Gait Details: pre-gait only; unable to advance to gait due to posterior lean    Posture / Balance Dynamic Sitting Balance Sitting balance - Comments: UE support needed for balance; no outside assist to maintain balance Balance Overall balance assessment: Needs assistance Sitting-balance support: Feet supported Sitting balance-Leahy Scale: Poor Sitting balance - Comments: UE support needed for balance; no outside assist to maintain balance Postural control: Right lateral lean Standing balance support: Bilateral upper extremity supported Standing balance-Leahy Scale: Poor Standing balance comment: up to mod assist to stand due to posterior lean    Special needs/care consideration Designated visitor is spouse, Dellis Filbert   Previous Home Environment  Living Arrangements: Spouse/significant other  Lives With: Spouse Available Help at Discharge: Family, Available 24 hours/day Type of Home: House Home Layout: Two level, Bed/bath upstairs, 1/2 bath on main level Alternate Level Stairs-Rails: Right, Left Alternate Level Stairs-Number of Steps: flight Home Access: Stairs to enter Entrance Stairs-Rails: Left Entrance Stairs-Number of Steps:  5 Bathroom Shower/Tub: Chiropodist: Handicapped height Bathroom Accessibility: Yes How Accessible: Accessible via walker Home Care Services: No  Discharge Living Setting Plans for Discharge Living Setting: Patient's home, Lives with (comment) (spouse) Type of Home at Discharge: House Discharge Home Layout: Two level, Able to live on main level with bedroom/bathroom, 1/2 bath on main level Alternate Level Stairs-Rails: Right, Left Alternate Level Stairs-Number of Steps: flight Discharge Home Access: Stairs to enter Entrance Stairs-Rails: Left Entrance Stairs-Number of Steps: 5 Discharge Bathroom Shower/Tub: Tub/shower unit Discharge Bathroom Toilet: Handicapped height Discharge Bathroom Accessibility: Yes How Accessible: Accessible via walker Does the patient have any problems obtaining your medications?: No  Social/Family/Support Systems Patient Roles: Spouse, Parent Contact Information: spouse, Dellis Filbert Anticipated Caregiver: spouse Anticipated Ambulance person Information: see above Ability/Limitations of Caregiver: no limitations Caregiver Availability: 24/7 Discharge Plan Discussed with Primary Caregiver: Yes Is Caregiver In Agreement with Plan?: Yes Does Caregiver/Family have Issues with Lodging/Transportation while Pt is in Rehab?: No  Goals Patient/Family Goal for Rehab: min asisst with PT and  OT Expected length of stay: ELOS 17 to 21 days Pt/Family Agrees to Admission and willing to participate: Yes Program Orientation Provided & Reviewed with Pt/Caregiver Including Roles  & Responsibilities: Yes  Decrease burden of Care through IP rehab admission: n/a  Possible need for SNF placement upon discharge:not anticipated  Patient Condition: This patient's medical and functional status has changed since the consult dated: 07/02/2020 in which the Rehabilitation Physician determined and documented that the patient's condition is appropriate for intensive  rehabilitative care in an inpatient rehabilitation facility. See "History of Present Illness" (above) for medical update. Functional changes are: max assist. Patient's medical and functional status update has been discussed with the Rehabilitation physician and patient remains appropriate for inpatient rehabilitation. Will admit to inpatient rehab today.  Preadmission Screen Completed By:  Cleatrice Burke, RN, 07/08/2020 12:33 PM ______________________________________________________________________   Discussed status with Dr. Dagoberto Ligas on 07/08/2020 at 1234 and received approval for admission today.  Admission Coordinator:  Cleatrice Burke, time 1700 Date 07/08/2020

## 2020-07-03 MED ORDER — LEVETIRACETAM 500 MG PO TABS
500.0000 mg | ORAL_TABLET | Freq: Two times a day (BID) | ORAL | Status: DC
  Administered 2020-07-03 – 2020-07-08 (×10): 500 mg via ORAL
  Filled 2020-07-03 (×11): qty 1

## 2020-07-03 NOTE — Progress Notes (Signed)
Inpatient Rehab Admissions Coordinator:   I met with Pt. And her husband at bedside to discuss potential CIR admit. Per Dr. Lacy Duverney note, neuro work up is complete and she is cleared to come to CIR; however; I do not have a bed available for this patient today. Will follow and update patient if a bed becomes available. Pt. And husband state understanding and continued interest in CIR admit.   Clemens Catholic, [place], [place] Admissions Coordinator  [phone] ([place]) [phone] (office)

## 2020-07-04 ENCOUNTER — Inpatient Hospital Stay (HOSPITAL_COMMUNITY): Payer: Medicare Other

## 2020-07-04 NOTE — Progress Notes (Signed)
Patient ID: Amanda Collier, female   DOB: 11-30-1945, 74 y.o.   MRN: [id number] BP 140/60 (BP Location: Right Arm)   Pulse 75   Temp 98.3 F (36.8 C) (Oral)   Resp (!) 22   Wt 74.8 kg   LMP 11/14/2003   SpO2 99%   BMI 28.32 kg/m  Alert and orientedx 4 Mri lumbar spine shows extensive subdural hematoma in the lower thoracic and lumbar regions. No significant cord compression at any level.  No etiology for subdural known at this time.  On the thoracic mri 11 days ago,this was not present. Will check hematology status. Moving all extremities well today. Significant pain however.

## 2020-07-04 NOTE — Progress Notes (Signed)
Inpatient Rehab Admissions Coordinator:   Met with patient at bedside to discuss potential CIR admission and to inform her that I do not have an available bed for her today. She remains interested in CIR admission once a bed becomes available. Will continue to follow for potential admission this week.   Clemens Catholic, [place], [place] Admissions Coordinator  [phone] ([place]) [phone] (office)

## 2020-07-05 MED ORDER — MAGNESIUM CITRATE PO SOLN
1.0000 | Freq: Once | ORAL | Status: AC
  Administered 2020-07-05: 1 via ORAL
  Filled 2020-07-05: qty 296

## 2020-07-05 NOTE — Progress Notes (Signed)
  Speech Language Pathology Treatment: Dysphagia  Patient Details Name: Amanda Collier MRN: [id number] DOB: [birth year] Today's Date: 07/05/2020 Time: 6295-2841 SLP Time Calculation (min) (ACUTE ONLY): 28 min  Assessment / Plan / Recommendation Clinical Impression  Pt is requesting a diet advancement, wanting specific items from the regular diet that are not available on mechanical soft. She is also having increased pain that limits her ability to sit upright. She says that she has been lying flat while trying to eat and drink, so education was provided about the importance of positioning. Encouraged pt to gradually raise the [facility], which she did, denying any pain when moving slowly. SLP also used reverse Trendelenburg to position pt more fully upright. She consumed regular solids and thin liquids with functional appearing swallow, including no overt s/s of aspiration, anterior loss, or buccal pocketing. Recommend advancing diet to regular solids and thin liquids while reinforcing upright positioning as much as possible.    HPI HPI: 74 yo with hx of DM, balance disorder, HTN and B subdural hematomas. Pt has fallen 7 times in last 3 weeks. Underwent evacuation of B hematomas 8/12 and again on 8/14. Pt with B ventriculostomy drains. On 8/2 pt had xrays of B ankles due to pain and pt with L nondisplaced fx of lateral malleolus and nondisplaced fx of lateral malleolus R fibula; sprain of tibiofibular ligament R ankle.      SLP Plan  Continue with current plan of care       Recommendations  Diet recommendations: Regular;Thin liquid Liquids provided via: Straw;Cup Medication Administration: Whole meds with puree Supervision: Staff to assist with self feeding;Full supervision/cueing for compensatory strategies Compensations: Slow rate;Small sips/bites;Minimize environmental distractions;Lingual sweep for clearance of pocketing Postural Changes and/or Swallow Maneuvers: Seated upright 90 degrees                 Oral Care Recommendations: Oral care BID Follow up Recommendations: Inpatient Rehab SLP Visit Diagnosis: Dysphagia, oral phase (R13.11) Plan: Continue with current plan of care       GO                Osie Bond., M.A. [place] Acute Rehabilitation Services Pager [phone] Office [phone]  07/05/2020, 12:41 PM

## 2020-07-05 NOTE — Progress Notes (Signed)
Patient ID: [name], female   DOB: 06-25-1946, 74 y.o.   MRN: [id number] Alert and oriented x 4 BP 118/74 (BP Location: Left Arm)   Pulse 68   Temp 98.5 F (36.9 C) (Oral)   Resp 18   Wt 74.8 kg   LMP 11/14/2003   SpO2 100%   BMI 28.32 kg/m  Moving all extremities Painful in lower back. Most likely due to subdural. Amanda Collier complains about the boots due to the fractures. Makes it difficult for PT Will do what we can with the boots and if not a candidate for rehab will treat appropriately

## 2020-07-05 NOTE — Progress Notes (Signed)
Inpatient Rehabilitation Admissions Coordinator  Patient unable to tolerate the intensity of therapy for CIR admit at this time.  Noted MRI. I will follow her progress.  Danne Baxter, RN, MSN Rehab Admissions Coordinator [phone] 07/05/2020 10:55 AM

## 2020-07-05 NOTE — Progress Notes (Signed)
Physical Therapy Treatment Patient Details Name: Amanda Collier MRN: [id number] DOB: Sep 04, 1946 Today's Date: 07/05/2020    History of Present Illness 74 yo with hx of DM, balance disorder, HTN and B subdural hematomas. Pt has fallen 7 tinmes in last 3 weeks. Underwent evacuation of B hematomas 8/12 and again on 8/14. Pt with B ventriculostomy drains. On 8/2 pt had xrays of B ankles due to pain and pt with L nondisplaced fx of lateral malleolus and nondisplaced fx of lateral malleolus R fibulu; sprian of tibiofibular ligament R ankle    PT Comments    Today's skilled session was limited due to lumbar pain with movement. Pt awaiting Dr. Christella Noa to round do discuss recent imaging results and pain. Pt also wants to wear sneakers instead of the Cam boot when OOB. Deferred this to her orthopedic MD/Cabbell. Acute PT to continue to progress mobility as pain allows.    Follow Up Recommendations  CIR     Equipment Recommendations  Wheelchair (measurements PT)    Precautions / Restrictions Precautions Precautions: Fall Required Braces or Orthoses: Other Brace Other Brace: Cam boot L; lace up ankle brace R Restrictions Other Position/Activity Restrictions: WBAT with braces per pt    Mobility  Bed Mobility               General bed mobility comments: pt lying with HOB flat. Reporting increased pain with raising her head up, so she is staying flat. Worked on raising head throughout session with 20 degrees achieved with no increased pain reported.      Exercises General Exercises - Lower Extremity Ankle Circles/Pumps: AROM;Strengthening;Both;10 reps;Supine;Limitations Ankle Circles/Pumps Limitations: manually resisted PF Heel Slides: AROM;Strengthening;Both;10 reps;Supine;Limitations Heel Slides Limitations: light manual resisted knee extension Hip ABduction/ADduction: AAROM;Strengthening;Both;10 reps;Supine;Limitations Hip Abduction/Adduction Limitations: assist/cues for form and  controlled movements Straight Leg Raises: AAROM;Strengthening;Both;10 reps;Supine;Limitations Straight Leg Raises Limitations: assist for form and controlled movements     Pertinent Vitals/Pain Pain Assessment: 0-10 Pain Score: 8  Pain Location: back Pain Descriptors / Indicators: Sore;Grimacing;Sharp;Aching Pain Intervention(s): Limited activity within patient's tolerance;Monitored during session;Premedicated before session     PT Goals (current goals can now be found in the care plan section) Acute Rehab PT Goals Patient Stated Goal: to get better PT Goal Formulation: With patient/family Time For Goal Achievement: 07/13/20 Potential to Achieve Goals: Good Progress towards PT goals: Progressing toward goals    Frequency    Min 3X/week      PT Plan Current plan remains appropriate    AM-PAC PT "6 Clicks" Mobility   Outcome Measure  Help needed turning from your back to your side while in a flat bed without using bedrails?: A Lot Help needed moving from lying on your back to sitting on the side of a flat bed without using bedrails?: A Lot Help needed moving to and from a bed to a chair (including a wheelchair)?: A Lot Help needed standing up from a chair using your arms (e.g., wheelchair or bedside chair)?: A Lot Help needed to walk in hospital room?: Total Help needed climbing 3-5 steps with a railing? : Total 6 Click Score: 10    End of Session   Activity Tolerance: No increased pain;Patient limited by pain Patient left: in bed;with family/visitor present;with call bell/phone within reach Nurse Communication: Other (comment) (continues to report back pain with movements) PT Visit Diagnosis: Muscle weakness (generalized) (M62.81)     Time: 7829-5621 PT Time Calculation (min) (ACUTE ONLY): 21 min  Charges:  $Therapeutic Exercise:  8-22 mins                     Willow Ora, [place], [facility] Acute Rehab Services [phone] 07/05/20, 10:52 AM  Willow Ora 07/05/2020, 10:51 AM

## 2020-07-06 DIAGNOSIS — S065X9A Traumatic subdural hemorrhage with loss of consciousness of unspecified duration, initial encounter: Secondary | ICD-10-CM

## 2020-07-06 DIAGNOSIS — R269 Unspecified abnormalities of gait and mobility: Secondary | ICD-10-CM

## 2020-07-06 NOTE — Progress Notes (Signed)
Inpatient Rehabilitation Admissions Coordinator  I continue to follow pt's ability to participate with therapies for a possible Cir admit.  Danne Baxter, RN, MSN Rehab Admissions Coordinator [phone] 07/06/2020 5:35 PM

## 2020-07-06 NOTE — Progress Notes (Signed)
Occupational Therapy Treatment Patient Details Name: Amanda Collier MRN: [id number] DOB: [birth year] Today's Date: 07/06/2020    History of present illness 74 yo with hx of DM, balance disorder, HTN and B subdural hematomas. Pt has fallen 7 tinmes in last 3 weeks. Underwent evacuation of B hematomas 8/12 and again on 8/14. Pt with B ventriculostomy drains. On 8/2 pt had xrays of B ankles due to pain and pt with L nondisplaced fx of lateral malleolus and nondisplaced fx of lateral malleolus R fibulu; sprian of tibiofibular ligament R ankle   OT comments  Patient continues to make minimal progress towards goals in skilled OT session. Patient's session encompassed ADLs at bed level due to increased pain from hematoma found in MRI on 8/23. Pt unwilling to attempt to sit EOB and continues to perseverate on using her tennis shoes instead of CAM boot on the L. Pt willing to complete bed level ADLs, however requires increased time and cues to maintain attention to task. Discharge recommendations have been updated as CIR has signed off due to minimal participation; will continue to follow acutely.    Follow Up Recommendations  SNF;Supervision/Assistance - 24 hour    Equipment Recommendations  3 in 1 bedside commode    Recommendations for Other Services      Precautions / Restrictions Precautions Precautions: Fall Required Braces or Orthoses: Other Brace Other Brace: Cam boot L; lace up ankle brace R Restrictions Other Position/Activity Restrictions: WBAT with braces per pt       Mobility Bed Mobility                  Transfers                      Balance                                           ADL either performed or assessed with clinical judgement   ADL       Grooming: Set up;Bed level;Oral care;Wash/dry face;Wash/dry hands                                 General ADL Comments: limited session due to pain and not wanting to  attempt to get EOB due to pain     Vision       Perception     Praxis      Cognition Arousal/Alertness: Awake/alert Behavior During Therapy: Anxious;Flat affect Overall Cognitive Status: Impaired/Different from baseline Area of Impairment: Memory;Problem solving                     Memory: Decreased short-term memory       Problem Solving: Slow processing;Requires verbal cues General Comments: Not able to fully assess due to limited session, but deficits noted in [facility] and processing        Exercises     Shoulder Instructions       General Comments      Pertinent Vitals/ Pain       Pain Assessment: Faces Faces Pain Scale: Hurts a little bit Pain Location: back Pain Descriptors / Indicators: Sore;Grimacing;Sharp;Aching Pain Intervention(s): Limited activity within patient's tolerance;Monitored during session  Home Living  Prior Functioning/Environment              Frequency  Min 2X/week        Progress Toward Goals  OT Goals(current goals can now be found in the care plan section)  Progress towards OT goals: Progressing toward goals  Acute Rehab OT Goals Patient Stated Goal: to get better OT Goal Formulation: With patient Time For Goal Achievement: 07/12/20 Potential to Achieve Goals: [place] Discharge plan needs to be updated;Frequency remains appropriate    Co-evaluation                 AM-PAC OT "6 Clicks" Daily Activity     Outcome Measure   Help from another person eating meals?: A Little Help from another person taking care of personal grooming?: A Little Help from another person toileting, which includes using toliet, bedpan, or urinal?: A Lot Help from another person bathing (including washing, rinsing, drying)?: A Lot Help from another person to put on and taking off regular upper body clothing?: A Little Help from another person to put on and taking off  regular lower body clothing?: A Lot 6 Click Score: 15    End of Session    OT Visit Diagnosis: Unsteadiness on feet (R26.81);Other abnormalities of gait and mobility (R26.89);Repeated falls (R29.6);Muscle weakness (generalized) (M62.81);Apraxia (R48.2);Other symptoms and signs involving cognitive function;Other symptoms and signs involving the nervous system (R29.898);Pain Pain - part of body: Ankle and joints of foot (Back)   Activity Tolerance Patient limited by pain (Self limiting and not wanting to attempt EOB)   Patient Left     Nurse Communication Mobility status        Time: 5732-2025 OT Time Calculation (min): 14 min  Charges: OT General Charges $OT Visit: 1 Visit OT Treatments $Self Care/Home Management : 8-22 mins  Corinne Ports E. Fremon Zacharia, COTA/L Acute Rehabilitation Services [place] 07/06/2020, 11:36 AM

## 2020-07-06 NOTE — Progress Notes (Signed)
Patient ID: Amanda Collier, female   DOB: 08-11-1946, 74 y.o.   MRN: [id number] BP (!) 108/58 (BP Location: Left Arm)   Pulse 85   Temp 98.8 F (37.1 C) (Oral)   Resp 16   Wt 74.8 kg   LMP 11/14/2003   SpO2 97%   BMI 28.32 kg/m  Alert and oriented x 4 Foley was removed but purewick was placed. She needs to be catheterized secondary to possibility of overflow incontinence, order written.  Neurology assessed. No recommendations forthcoming.  Possibility of urology consult, as possible overflow incontinence not explained

## 2020-07-06 NOTE — Consult Note (Addendum)
Neurology Consultation  Reason for Consult: Sacral pain  Referring Physician: Dr. Christella Noa  CC: Sacral pain  History is obtained from: Patient and chart  HPI: Amanda Collier is a 74 y.o. female with history of leg swelling, hypertension, hyperlipidemia and headache.  This is a patient who was brought to the emergency department after multiple falls and secondary to BLE weakness and gait instability. Patient was found to have bilateral subdural hematomas on imaging.  Per notes she has fallen approximately 6-7 times during the last 3 weeks.  She has been using walker at home.  On 06/24/2020 patient underwent bilateral craniotomy for subdural hematoma evacuation.  Patient was doing well until last Wednesday when she noted that she was having significant sacral pain.    On 06/23/2020 patient did have a thoracic spine MRI without contrast which did not show any hemorrhagic products within the lower spine at that time. However, on 07/06/2020 a lumbar spine MRI was obtained showing a subdural hemorrhage extending throughout the length of the visualized spine which is mainly dorsal and most notable in the lower thoracic segments and at the L5-S1 region; on the images, the distal cord was mildly displaced by the hemorrhage but no abnormal cord signal was identified. There was also moderate flattening of the thecal sac at L5-S1 due to disc bulge and subdural hemorrhage.  Neurology was consulted for further evaluation.  Currently, due to multiple factors including ankle fractures, leg weakness, sacral pain and possibly the spinal subdural hematoma, the patient is having significant difficulty walking.     Past Medical History:  Diagnosis Date  . Headache syndrome 12/18/2016  . High blood pressure   . Hypertension    followed by Dr Gillian Shields medications  . Leg swelling    Hx of    Family History  Adopted: Yes  Problem Relation Age of Onset  . Heart attack Maternal Uncle        ? unknown if  correct/patient adopted    Social History:   reports that she has never smoked. She has never used smokeless tobacco. She reports current alcohol use of about 7.0 standard drinks of alcohol per week. She reports that she does not use drugs.  Medications  Current Facility-Administered Medications:  .  0.9 %  sodium chloride infusion, 250 mL, Intravenous, PRN, Ashok Pall, MD .  0.9 %  sodium chloride infusion, , Intravenous, Continuous, Ashok Pall, MD, Stopped at 06/30/20 2215 .  acetaminophen (TYLENOL) tablet 650 mg, 650 mg, Oral, Q6H PRN, 650 mg at 06/28/20 2117 **OR** acetaminophen (TYLENOL) suppository 650 mg, 650 mg, Rectal, Q6H PRN, Ashok Pall, MD .  aspirin EC tablet 81 mg, 81 mg, Oral, BID, Ashok Pall, MD, 81 mg at 07/06/20 0951 .  atorvastatin (LIPITOR) tablet 10 mg, 10 mg, Oral, Daily, Ashok Pall, MD, 10 mg at 07/06/20 0952 .  bethanechol (URECHOLINE) tablet 10 mg, 10 mg, Oral, TID, Ashok Pall, MD, 10 mg at 07/06/20 0953 .  Chlorhexidine Gluconate Cloth 2 % PADS 6 each, 6 each, Topical, Daily, Ashok Pall, MD, 6 each at 07/05/20 [phone] .  clonazePAM (KLONOPIN) tablet 0.5 mg, 0.5 mg, Oral, QHS PRN, Ashok Pall, MD, 0.5 mg at 07/05/20 2153 .  divalproex (DEPAKOTE ER) 24 hr tablet 250 mg, 250 mg, Oral, Daily, Ashok Pall, MD, 250 mg at 07/06/20 0955 .  divalproex (DEPAKOTE SPRINKLE) capsule 750 mg, 750 mg, Oral, QHS, Cabbell, Kyle, MD, 750 mg at 07/05/20 2150 .  heparin injection 5,000 Units, 5,000 Units,  Subcutaneous, Q8H, Ashok Pall, MD, 5,000 Units at 07/06/20 [phone] .  HYDROcodone-acetaminophen (NORCO/VICODIN) 5-325 MG per tablet 1-2 tablet, 1-2 tablet, Oral, Q4H PRN, Ashok Pall, MD, 2 tablet at 07/06/20 0954 .  labetalol (NORMODYNE) injection 10-40 mg, 10-40 mg, Intravenous, Q10 min PRN, Ashok Pall, MD .  levETIRAcetam (KEPPRA) tablet 500 mg, 500 mg, Oral, BID, [place], Kimberly B, RPH, 500 mg at 07/06/20 0954 .  losartan (COZAAR) tablet 100 mg, 100 mg,  Oral, Daily, Ashok Pall, MD, 100 mg at 07/06/20 0950 .  MEDLINE mouth rinse, 15 mL, Mouth Rinse, BID, Ashok Pall, MD, 15 mL at 07/05/20 2156 .  morphine 2 MG/ML injection 1-2 mg, 1-2 mg, Intravenous, Q2H PRN, Ashok Pall, MD .  naloxone (NARCAN) injection 0.08 mg, 0.08 mg, Intravenous, PRN, Ashok Pall, MD .  ondansetron (ZOFRAN) tablet 4 mg, 4 mg, Oral, Q6H PRN **OR** ondansetron (ZOFRAN) injection 4 mg, 4 mg, Intravenous, Q6H PRN, Ashok Pall, MD .  pantoprazole (PROTONIX) EC tablet 40 mg, 40 mg, Oral, QHS, Ashok Pall, MD, 40 mg at 07/05/20 2153 .  senna-docusate (Senokot-S) tablet 1 tablet, 1 tablet, Oral, BID, Ashok Pall, MD, 1 tablet at 07/06/20 0953 .  sodium chloride flush (NS) 0.9 % injection 3 mL, 3 mL, Intravenous, Q12H, Ashok Pall, MD, 3 mL at 07/05/20 2157 .  sodium chloride flush (NS) 0.9 % injection 3 mL, 3 mL, Intravenous, PRN, Ashok Pall, MD, 3 mL at 07/06/20 1002 .  topiramate (TOPAMAX) tablet 75 mg, 75 mg, Oral, QHS, Ashok Pall, MD, 75 mg at 07/05/20 2152 .  Vitamin D (Ergocalciferol) (DRISDOL) capsule 50,000 Units, 50,000 Units, Oral, Q Wed, [place], Marylyn Ishihara, MD, 50,000 Units at 06/30/20 0910 .  zolpidem (AMBIEN) tablet 5 mg, 5 mg, Oral, QHS PRN, Ashok Pall, MD  ROS:     General ROS: negative for - chills, fatigue, fever, night sweats, weight gain or weight loss Psychological ROS: negative for - behavioral disorder, hallucinations, memory difficulties, mood swings or suicidal ideation Ophthalmic ROS: negative for - blurry vision, double vision, eye pain or loss of vision ENT ROS: negative for - epistaxis, nasal discharge, oral lesions, sore throat, tinnitus or vertigo Allergy and Immunology ROS: negative for - hives or itchy/watery eyes Hematological and Lymphatic ROS: negative for - bleeding problems, bruising or swollen lymph nodes Endocrine ROS: negative for - galactorrhea, hair pattern changes, polydipsia/polyuria or temperature  intolerance Respiratory ROS: negative for - cough, hemoptysis, shortness of breath or wheezing Cardiovascular ROS: negative for - chest pain, dyspnea on exertion, edema or irregular heartbeat Gastrointestinal ROS: negative for - abdominal pain, diarrhea, hematemesis, nausea/vomiting or stool incontinence Genito-Urinary ROS: negative for - dysuria, hematuria, incontinence or urinary frequency/urgency Musculoskeletal ROS: Positive for - joint swelling or muscular weakness Neurological ROS: as noted in HPI Dermatological ROS: negative for rash and skin lesion changes  Exam: Current vital signs: BP (!) 136/125 (BP Location: Left Arm)   Pulse 66   Temp 98.1 F (36.7 C) (Oral)   Resp 20   Wt 74.8 kg   LMP 11/14/2003   SpO2 96%   BMI 28.32 kg/m  Vital signs in last 24 hours: Temp:  [97.4 F (36.3 C)-98.8 F (37.1 C)] 98.1 F (36.7 C) (08/24 1213) Pulse Rate:  [66-73] 66 (08/24 1213) Resp:  [16-24] 20 (08/24 1213) BP: (106-136)/(61-125) 136/125 (08/24 1213) SpO2:  [93 %-100 %] 96 % (08/24 1213)   Constitutional: Appears well-developed and well-nourished.  Psych: Affect appropriate to situation Eyes: No scleral injection HENT: No OP obstrucion  Head: Sutures are noted  Cardiovascular: Normal rate and regular rhythm.  Respiratory: Effort normal, non-labored breathing GI: Soft.  No distension. There is no tenderness.  Skin: WDI  Neuro: Mental Status: Patient is awake, alert, oriented to person, place, month, year, and situation. Speech-naming, repeating, comprehension are intact. The patient is able to give a good history.  Patient is able to follow commands without difficulty. Cranial Nerves: II: Visual Fields are full. PERRL.  III,IV, VI: EOMI without ptosis or diplopia.  V: Facial sensation is symmetric to temperature VII: Facial movement is symmetric.  VIII: Hearing is intact to voice X: Palate elevates symmetrically XI: Shoulder shrug is symmetric. XII: Tongue is  midline without atrophy or fasciculations.  Motor: Tone is normal. Bulk is normal.   Upper extremities show 5/5 strength.   BLE: Hip flexion shows 5/5 strength.  Knee extension and flexion show 5/5 strength bilaterally.  Ankle dorsiflexion and plantar flexion show 5/5 strength.  With straight leg raise she is able to lift leg approximately 6 to 8 inches off the bed with some discomfort. Sensory: Sensation is symmetric to light touch and temperature in the arms and legs. Deep Tendon Reflexes: 2+ and symmetric in the biceps and patellae.  Plantars: Toes are downgoing bilaterally.  Cerebellar: FNF and HKS are intact bilaterally. No ataxia is appreciated.   Labs I have reviewed labs in epic and the results pertinent to this consultation are:   CBC    Component Value Date/Time   WBC 4.8 06/24/2020 1854   RBC 3.69 (L) 06/24/2020 1854   HGB 12.3 06/24/2020 1854   HCT 37.4 06/24/2020 1854   PLT 271 06/24/2020 1854   MCV 101.4 (H) 06/24/2020 1854   MCH 33.3 06/24/2020 1854   MCHC 32.9 06/24/2020 1854   RDW 12.7 06/24/2020 1854   LYMPHSABS 1.5 06/23/2020 2126   MONOABS 0.5 06/23/2020 2126   EOSABS 0.1 06/23/2020 2126   BASOSABS 0.0 06/23/2020 2126    CMP     Component Value Date/Time   NA 142 06/23/2020 2126   K 3.3 (L) 06/23/2020 2126   CL 109 06/23/2020 2126   CO2 23 06/23/2020 2126   GLUCOSE 102 (H) 06/23/2020 2126   BUN 15 06/23/2020 2126   CREATININE 0.89 06/24/2020 1854   CALCIUM 9.5 06/23/2020 2126   PROT 6.2 (L) 06/23/2020 2126   ALBUMIN 3.7 06/23/2020 2126   AST 18 06/23/2020 2126   ALT 17 06/23/2020 2126   ALKPHOS 55 06/23/2020 2126   BILITOT 0.7 06/23/2020 2126   GFRNONAA >60 06/24/2020 1854   GFRAA >60 06/24/2020 1854    Lipid Panel  No results found for: CHOL, TRIG, HDL, CHOLHDL, VLDL, LDLCALC, LDLDIRECT   Imaging I have reviewed the images obtained:  MRI examination lumbar spine-Subdural hemorrhage extending throughout the length of the visualized spine  is mainly dorsal and most notable in the lower thoracic segments and at the L5-S1 level. The distal cord is mildly deflected by hemorrhage but no cord signal abnormality is identified. If the patient is myelopathic, MR imaging of the thoracic spine could be performed for further evaluation. Moderate flattening of the thecal sac at L5-S1 due to a disc bulge and subdural hemorrhage.  Etta Quill PA-C [facility] [phone] 07/06/2020, 1:06 PM   Assessment: 74 year old female who presented after multiple falls. Imaging revealed bilateral subdural hematomas which required evacuation. The patient subsequently developed lower back and sacral pain. MRI of lumbar spine revealed subdural hemorrhage extending throughout the length of  the visualized spine, mainly dorsal and most notable in the lower thoracic segments and at the L5-S1 level.  1. Neurological exam is nonfocal. However, there is discomfort with straight leg raise, consistent with spinal meningeal irritation by the hemorrhage.   2. Much of her pain likely is due to irritation secondary to blood noted in the thecal sac along with flattening of the thecal sac at L5-S1.  We do believe this will improve over time as the blood products are resorbed.  Impression: -Low back pain and sacral pain most likely secondary to meningeal irritation  Recommendations: -Continue physical therapy -May consider adding Neurontin 300 mg twice daily for nerve pain  I have seen and examined the patient. I have formulated the assessment and recommendations. 74 year old female with sacral pain, most likely secondary to spinal subdural hematoma. Consider addition of Neurontin to her regimen. Will defer to Neurosurgery for her other medications. Continue PT. Re-image if she develops lower extremity weakness, saddle anesthesia or bladder/bowel dysfunction. Electronically signed: Dr. Kerney Elbe

## 2020-07-07 NOTE — Progress Notes (Signed)
Spoke with patient about soap suds enema for constipation.  Pt would like to wait until morning to avoid a large BM while sleeping and unaware.

## 2020-07-07 NOTE — Progress Notes (Signed)
  Speech Language Pathology Treatment: Cognitive-Linquistic;Dysphagia  Patient Details Name: Amanda Collier MRN: [id number] DOB: [birth year] Today's Date: 07/07/2020 Time: 2563-8937 SLP Time Calculation (min) (ACUTE ONLY): 17 min  Assessment / Plan / Recommendation Clinical Impression  Amanda Collier demonstrates improving oral dysphagia and cognition. She was partially reclined in bed, complaining of 6/10 back pain (meds due soon). She and husband both report her doing very well with her recently upgraded diet of regular solids and thin liquids with no pocketing. She was seen with part of a cookie and thin liquids without any pocketing or residue. Pt continues to refuse to sit upright given her pain level, but appears to be safe in her partially reclined state; continue to encourage sitting as upright as possible for POs. Husband reports that her cognition fluctuates with her pain medications and that she typically gets "confused" about an hour or so after receiving pain meds. Pt was oriented x4 and completed verbal problem solving very well. She was asked how she could find out the date/time if she got confused and she independently reported that she would check the newspaper, the tv, the whiteboard (which she pointed out was wrong) and her phone. She completed verbal problem solving for functional tasks after leaving the hospital (driving, cooking, etc.) and demonstrates good insight into her need for assist. She was provided a handout of memory strategies (WRAPS) for write it down, repeat it, associate it, picture it, schedule it. She and SLP reviewed handout. She utilized strategy of association to recall SLP's name later in session. Pt with great improvement since last cog tx. No further ST indicated for dysphagia; continue treatment for cognition. Pt expected to continue to benefit from ST at next level of care or as outpatient.   HPI HPI: 74 yo with hx of DM, balance disorder, HTN and B subdural  hematomas. Pt has fallen 7 times in last 3 weeks. Underwent evacuation of B hematomas 8/12 and again on 8/14. Pt with B ventriculostomy drains. On 8/2 pt had xrays of B ankles due to pain and pt with L nondisplaced fx of lateral malleolus and nondisplaced fx of lateral malleolus R fibula; sprain of tibiofibular ligament R ankle.      SLP Plan  Continue with current plan of care;Goals updated       Recommendations  Diet recommendations: Regular;Thin liquid Liquids provided via: Straw;Cup Medication Administration: Whole meds with puree Supervision: Staff to assist with self feeding;Full supervision/cueing for compensatory strategies Compensations: Slow rate;Small sips/bites;Minimize environmental distractions;Lingual sweep for clearance of pocketing Postural Changes and/or Swallow Maneuvers: Seated upright 90 degrees                Oral Care Recommendations: Oral care BID Follow up Recommendations: Inpatient Rehab SLP Visit Diagnosis: Dysphagia, oral phase (R13.11);Cognitive communication deficit (R41.841) Plan: Continue with current plan of care;Goals updated                    Mahati Vajda P. Dimitra Woodstock, M.S., [place] Pathologist Acute Rehabilitation Services Pager: [place] 07/07/2020, 4:10 PM

## 2020-07-07 NOTE — Progress Notes (Signed)
Physical Therapy Treatment Patient Details Name: Amanda Collier MRN: [id number] DOB: [birth year] Today's Date: 07/07/2020    History of Present Illness 74 yo with hx of DM, balance disorder, HTN and B subdural hematomas. Pt has fallen 7 tinmes in last 3 weeks. Underwent evacuation of B hematomas 8/12 and again on 8/14. Pt with B ventriculostomy drains. On 8/2 pt had xrays of B ankles due to pain and pt with L nondisplaced fx of lateral malleolus and nondisplaced fx of lateral malleolus R fibulu; sprian of tibiofibular ligament R ankle. Patient developed severe low back pain; 07/06/2020 lumbar spine MRI showed SDH extending mainly dorsal in the lower thoracic segments and at the L5-S1 region; distal cord was mildly displaced but no abnormal cord signal was identified. There was also moderate flattening of the thecal sac at L5-S1 due to disc bulge. All new changes since 8/11 lumbar MRI. No surgery planned    PT Comments    Patient willing to participate with PT as explained plan is to focus on getting EOB and standing. She was premedicated for pain and showed no signs of pain throughout session (no wincing, crying out, guarding) and  stating pain remained consistent 6/10. Tolerated rolling multiple times pre- and post- EOB due to incontinence of bowels (had enema last night) with pt requiring cues for sequencing and only min physical assist. +2 mod assist for side to sit and sit to stand with RW. Tolerated standing x 3 minutes with pre-gait activities. Returned to bed for pericare after 2nd incontinent episode.    Follow Up Recommendations  CIR     Equipment Recommendations  Wheelchair (measurements PT)    Recommendations for Other Services       Precautions / Restrictions Precautions Precautions: Fall Required Braces or Orthoses: Other Brace Other Brace: Cam boot L; lace up ankle brace R; has sneaker for rt foot Restrictions Other Position/Activity Restrictions: WBAT with braces per pt     Mobility  Bed Mobility Overal bed mobility: Needs Assistance Bed Mobility: Rolling;Sidelying to Sit;Sit to Sidelying Rolling: Min assist (with rail rt and lt) Sidelying to sit: Mod assist;+2 for physical assistance;HOB elevated (HOB 20)     Sit to sidelying: Mod assist;+2 for safety/equipment General bed mobility comments: Pt with HOB 20 degrees trying to eat breakfast; explained plan to get as close to EOB as possible (in sidelying)and with bed elevated, lower legs over EOB as simultaneously raising torso with intention to minimize hip/trunk flexion to minimize pain; pt agreeable, completed without reports of pain or wincing  Transfers Overall transfer level: Needs assistance Equipment used: Rolling walker (2 wheeled) Transfers: Sit to/from Stand Sit to Stand: Mod assist;+2 physical assistance;From elevated surface         General transfer comment: mod A up to stand with pt leaning posteriorly against bed, pt needing cues to extend hips; stood for 3 minutes with assisted weight-shifting left and right (and continued assist for anterior wt-shfit over BOS  Ambulation/Gait             General Gait Details: pre-gait only; unable to advance to gait due to posterior lean   Stairs             Wheelchair Mobility    Modified Rankin (Stroke Patients Only)       Balance Overall balance assessment: Needs assistance Sitting-balance support: Feet supported Sitting balance-Leahy Scale: Poor Sitting balance - Comments: UE support needed for balance; no outside assist to maintain balance   Standing balance support:  Bilateral upper extremity supported Standing balance-Leahy Scale: Poor Standing balance comment: up to mod assist to stand due to posterior lean                            Cognition Arousal/Alertness: Awake/alert Behavior During Therapy: Anxious;Flat affect Overall Cognitive Status: Impaired/Different from baseline Area of Impairment: Problem  solving;Safety/judgement;Awareness;Attention                   Current Attention Level: Sustained   Following Commands: Follows one step commands consistently;Follows one step commands with increased time Safety/Judgement: Decreased awareness of safety;Decreased awareness of deficits Awareness: Intellectual Problem Solving: Slow processing;Requires verbal cues General Comments: Patient can give detailed history of hospital events (including dates); decr awareness of ankle deficits as continues to ask to not wear boot/brace because they make it hard to balance (true however bil ankle fractures)      Exercises      General Comments General comments (skin integrity, edema, etc.): Husband voluntarily left the room at beginning of session as he thinks she focuses better if he is not present. Increased time as pt very talkative and wants to know each step of the plan prior and during movements      Pertinent Vitals/Pain Pain Assessment: 0-10 Pain Score: 6  Pain Location: back Pain Descriptors / Indicators: Sore Pain Intervention(s): Limited activity within patient's tolerance;Monitored during session;Premedicated before session    Home Living                      Prior Function            PT Goals (current goals can now be found in the care plan section) Acute Rehab PT Goals Patient Stated Goal: to get better Time For Goal Achievement: 07/13/20 Potential to Achieve Goals: Good Progress towards PT goals: Progressing toward goals    Frequency    Min 3X/week      PT Plan Current plan remains appropriate    Co-evaluation              AM-PAC PT "6 Clicks" Mobility   Outcome Measure  Help needed turning from your back to your side while in a flat bed without using bedrails?: A Lot Help needed moving from lying on your back to sitting on the side of a flat bed without using bedrails?: A Lot Help needed moving to and from a bed to a chair (including a  wheelchair)?: A Lot Help needed standing up from a chair using your arms (e.g., wheelchair or bedside chair)?: A Lot Help needed to walk in hospital room?: Total Help needed climbing 3-5 steps with a railing? : Total 6 Click Score: 10    End of Session Equipment Utilized During Treatment:  (camboot and L ankle brace) Activity Tolerance: No increased pain;Patient tolerated treatment well Patient left: in bed;with family/visitor present;with call bell/phone within reach;with bed alarm set;with SCD's reapplied Nurse Communication: Mobility status;Other (comment) (+BM in standing; cleaned; needs new purewick) PT Visit Diagnosis: Muscle weakness (generalized) (M62.81)     Time: 6761-9509 PT Time Calculation (min) (ACUTE ONLY): 42 min  Charges:  $Gait Training: 8-22 mins $Therapeutic Activity: 8-22 mins $Self Care/Home Management: 8-22                      Arby Barrette, PT Pager [phone]    Rexanne Mano 07/07/2020, 10:02 AM

## 2020-07-07 NOTE — Progress Notes (Signed)
Patient ID: Amanda Collier, female   DOB: 1946-08-08, 74 y.o.   MRN: [id number] BP 121/66 (BP Location: Left Arm)   Pulse 76   Temp 98.3 F (36.8 C) (Oral)   Resp 16   Ht 5\' 4"  (1.626 m)   Wt 74.8 kg   LMP 11/14/2003   SpO2 96%   BMI 28.32 kg/m  Alert and oriented x 4, speech is clear and fluent Tried to work with pt, difficult because she has to wear her boot which is very painful.  Will have her catheterized to assess her bladder.

## 2020-07-07 NOTE — Progress Notes (Signed)
Inpatient Rehabilitation Admissions Coordinator  I met at bedside with patient to discuss her progress with therapy. Reports pain better controlled. I will discuss with Dr. Naaman Plummer and follow up tomorrow with rehab venue option.  Danne Baxter, RN, MSN Rehab Admissions Coordinator [phone] 07/07/2020 5:44 PM

## 2020-07-08 ENCOUNTER — Other Ambulatory Visit: Payer: Self-pay

## 2020-07-08 ENCOUNTER — Encounter (HOSPITAL_COMMUNITY): Payer: Self-pay | Admitting: Physical Medicine & Rehabilitation

## 2020-07-08 ENCOUNTER — Inpatient Hospital Stay (HOSPITAL_COMMUNITY)
Admission: RE | Admit: 2020-07-08 | Discharge: 2020-08-04 | DRG: 945 | Disposition: A | Discharge status: Home Health | Payer: Medicare Other | Source: Intra-hospital | Attending: Physical Medicine & Rehabilitation | Admitting: Physical Medicine & Rehabilitation

## 2020-07-08 DIAGNOSIS — N390 Urinary tract infection, site not specified: Secondary | ICD-10-CM | POA: Diagnosis not present

## 2020-07-08 DIAGNOSIS — E871 Hypo-osmolality and hyponatremia: Secondary | ICD-10-CM | POA: Diagnosis present

## 2020-07-08 DIAGNOSIS — G441 Vascular headache, not elsewhere classified: Secondary | ICD-10-CM | POA: Diagnosis present

## 2020-07-08 DIAGNOSIS — Z7982 Long term (current) use of aspirin: Secondary | ICD-10-CM

## 2020-07-08 DIAGNOSIS — Z885 Allergy status to narcotic agent status: Secondary | ICD-10-CM

## 2020-07-08 DIAGNOSIS — S8262XS Displaced fracture of lateral malleolus of left fibula, sequela: Secondary | ICD-10-CM | POA: Diagnosis not present

## 2020-07-08 DIAGNOSIS — I10 Essential (primary) hypertension: Secondary | ICD-10-CM | POA: Diagnosis present

## 2020-07-08 DIAGNOSIS — M545 Low back pain: Secondary | ICD-10-CM | POA: Diagnosis not present

## 2020-07-08 DIAGNOSIS — S065X1D Traumatic subdural hemorrhage with loss of consciousness of 30 minutes or less, subsequent encounter: Secondary | ICD-10-CM | POA: Diagnosis not present

## 2020-07-08 DIAGNOSIS — Z888 Allergy status to other drugs, medicaments and biological substances status: Secondary | ICD-10-CM

## 2020-07-08 DIAGNOSIS — I959 Hypotension, unspecified: Secondary | ICD-10-CM | POA: Diagnosis not present

## 2020-07-08 DIAGNOSIS — A499 Bacterial infection, unspecified: Secondary | ICD-10-CM | POA: Diagnosis not present

## 2020-07-08 DIAGNOSIS — S8261XS Displaced fracture of lateral malleolus of right fibula, sequela: Secondary | ICD-10-CM | POA: Diagnosis not present

## 2020-07-08 DIAGNOSIS — B9620 Unspecified Escherichia coli [E. coli] as the cause of diseases classified elsewhere: Secondary | ICD-10-CM | POA: Diagnosis not present

## 2020-07-08 DIAGNOSIS — R269 Unspecified abnormalities of gait and mobility: Secondary | ICD-10-CM | POA: Diagnosis present

## 2020-07-08 DIAGNOSIS — E119 Type 2 diabetes mellitus without complications: Secondary | ICD-10-CM | POA: Diagnosis present

## 2020-07-08 DIAGNOSIS — G4700 Insomnia, unspecified: Secondary | ICD-10-CM | POA: Diagnosis present

## 2020-07-08 DIAGNOSIS — D62 Acute posthemorrhagic anemia: Secondary | ICD-10-CM | POA: Diagnosis present

## 2020-07-08 DIAGNOSIS — F419 Anxiety disorder, unspecified: Secondary | ICD-10-CM | POA: Diagnosis present

## 2020-07-08 DIAGNOSIS — F329 Major depressive disorder, single episode, unspecified: Secondary | ICD-10-CM | POA: Diagnosis present

## 2020-07-08 DIAGNOSIS — K5900 Constipation, unspecified: Secondary | ICD-10-CM | POA: Diagnosis not present

## 2020-07-08 DIAGNOSIS — M7989 Other specified soft tissue disorders: Secondary | ICD-10-CM | POA: Diagnosis not present

## 2020-07-08 DIAGNOSIS — K592 Neurogenic bowel, not elsewhere classified: Secondary | ICD-10-CM | POA: Diagnosis present

## 2020-07-08 DIAGNOSIS — S065X9D Traumatic subdural hemorrhage with loss of consciousness of unspecified duration, subsequent encounter: Principal | ICD-10-CM

## 2020-07-08 DIAGNOSIS — E876 Hypokalemia: Secondary | ICD-10-CM | POA: Diagnosis not present

## 2020-07-08 DIAGNOSIS — K649 Unspecified hemorrhoids: Secondary | ICD-10-CM | POA: Diagnosis not present

## 2020-07-08 DIAGNOSIS — N319 Neuromuscular dysfunction of bladder, unspecified: Secondary | ICD-10-CM | POA: Diagnosis present

## 2020-07-08 DIAGNOSIS — S82899A Other fracture of unspecified lower leg, initial encounter for closed fracture: Secondary | ICD-10-CM

## 2020-07-08 DIAGNOSIS — Z7289 Other problems related to lifestyle: Secondary | ICD-10-CM

## 2020-07-08 DIAGNOSIS — S065X9A Traumatic subdural hemorrhage with loss of consciousness of unspecified duration, initial encounter: Secondary | ICD-10-CM | POA: Diagnosis present

## 2020-07-08 DIAGNOSIS — Z86718 Personal history of other venous thrombosis and embolism: Secondary | ICD-10-CM | POA: Diagnosis not present

## 2020-07-08 DIAGNOSIS — E785 Hyperlipidemia, unspecified: Secondary | ICD-10-CM | POA: Diagnosis present

## 2020-07-08 DIAGNOSIS — S8262XD Displaced fracture of lateral malleolus of left fibula, subsequent encounter for closed fracture with routine healing: Secondary | ICD-10-CM | POA: Diagnosis not present

## 2020-07-08 DIAGNOSIS — I878 Other specified disorders of veins: Secondary | ICD-10-CM | POA: Diagnosis not present

## 2020-07-08 DIAGNOSIS — W1830XD Fall on same level, unspecified, subsequent encounter: Secondary | ICD-10-CM

## 2020-07-08 DIAGNOSIS — S8266XD Nondisplaced fracture of lateral malleolus of unspecified fibula, subsequent encounter for closed fracture with routine healing: Secondary | ICD-10-CM | POA: Diagnosis not present

## 2020-07-08 DIAGNOSIS — Z79890 Hormone replacement therapy (postmenopausal): Secondary | ICD-10-CM

## 2020-07-08 DIAGNOSIS — I82542 Chronic embolism and thrombosis of left tibial vein: Secondary | ICD-10-CM | POA: Diagnosis present

## 2020-07-08 DIAGNOSIS — S065X0S Traumatic subdural hemorrhage without loss of consciousness, sequela: Secondary | ICD-10-CM | POA: Diagnosis not present

## 2020-07-08 DIAGNOSIS — R296 Repeated falls: Secondary | ICD-10-CM | POA: Diagnosis present

## 2020-07-08 DIAGNOSIS — M7731 Calcaneal spur, right foot: Secondary | ICD-10-CM | POA: Diagnosis not present

## 2020-07-08 DIAGNOSIS — S065X0A Traumatic subdural hemorrhage without loss of consciousness, initial encounter: Secondary | ICD-10-CM

## 2020-07-08 DIAGNOSIS — S065X0D Traumatic subdural hemorrhage without loss of consciousness, subsequent encounter: Secondary | ICD-10-CM | POA: Diagnosis not present

## 2020-07-08 DIAGNOSIS — Z7141 Alcohol abuse counseling and surveillance of alcoholic: Secondary | ICD-10-CM

## 2020-07-08 DIAGNOSIS — Z79899 Other long term (current) drug therapy: Secondary | ICD-10-CM

## 2020-07-08 DIAGNOSIS — M25372 Other instability, left ankle: Secondary | ICD-10-CM | POA: Diagnosis not present

## 2020-07-08 DIAGNOSIS — M19071 Primary osteoarthritis, right ankle and foot: Secondary | ICD-10-CM | POA: Diagnosis not present

## 2020-07-08 DIAGNOSIS — S065XAA Traumatic subdural hemorrhage with loss of consciousness status unknown, initial encounter: Secondary | ICD-10-CM | POA: Diagnosis present

## 2020-07-08 DIAGNOSIS — S065X1S Traumatic subdural hemorrhage with loss of consciousness of 30 minutes or less, sequela: Secondary | ICD-10-CM | POA: Diagnosis not present

## 2020-07-08 LAB — CBC
HCT: 33.4 % — ABNORMAL LOW (ref 36.0–46.0)
Hemoglobin: 11.2 g/dL — ABNORMAL LOW (ref 12.0–15.0)
MCH: 31.7 pg (ref 26.0–34.0)
MCHC: 33.5 g/dL (ref 30.0–36.0)
MCV: 94.6 fL (ref 80.0–100.0)
Platelets: 447 10*3/uL — ABNORMAL HIGH (ref 150–400)
RBC: 3.53 MIL/uL — ABNORMAL LOW (ref 3.87–5.11)
RDW: 11.9 % (ref 11.5–15.5)
WBC: 7.7 10*3/uL (ref 4.0–10.5)
nRBC: 0 % (ref 0.0–0.2)

## 2020-07-08 LAB — CREATININE, SERUM
Creatinine, Ser: 0.71 mg/dL (ref 0.44–1.00)
GFR calc Af Amer: >60 mL/min (ref >60)
GFR calc non Af Amer: >60 mL/min (ref >60)

## 2020-07-08 MED ORDER — ATORVASTATIN CALCIUM 10 MG PO TABS
10.0000 mg | ORAL_TABLET | Freq: Every day | ORAL | Status: DC
  Administered 2020-07-09 – 2020-08-04 (×27): 10 mg via ORAL
  Filled 2020-07-08 (×27): qty 1

## 2020-07-08 MED ORDER — LEVETIRACETAM 500 MG PO TABS: 500.0000 mg | ORAL_TABLET | Freq: Two times a day (BID) | ORAL | Status: DC

## 2020-07-08 MED ORDER — PANTOPRAZOLE SODIUM 40 MG PO TBEC
40.0000 mg | DELAYED_RELEASE_TABLET | Freq: Every day | ORAL | Status: DC
  Administered 2020-07-08 – 2020-08-03 (×26): 40 mg via ORAL
  Filled 2020-07-08 (×28): qty 1

## 2020-07-08 MED ORDER — CLONAZEPAM 0.5 MG PO TABS
0.5000 mg | ORAL_TABLET | Freq: Every evening | ORAL | Status: DC | PRN
  Administered 2020-07-11 – 2020-07-15 (×3): 0.5 mg via ORAL
  Filled 2020-07-08 (×4): qty 1

## 2020-07-08 MED ORDER — HEPARIN SODIUM (PORCINE) 5000 UNIT/ML IJ SOLN
5000.0000 [IU] | Freq: Three times a day (TID) | INTRAMUSCULAR | Status: DC
  Administered 2020-07-08 – 2020-08-04 (×78): 5000 [IU] via SUBCUTANEOUS
  Filled 2020-07-08 (×79): qty 1

## 2020-07-08 MED ORDER — BETHANECHOL CHLORIDE 10 MG PO TABS
10.0000 mg | ORAL_TABLET | Freq: Three times a day (TID) | ORAL | Status: DC
  Administered 2020-07-08 – 2020-07-15 (×20): 10 mg via ORAL
  Filled 2020-07-08 (×20): qty 1

## 2020-07-08 MED ORDER — HYDROCODONE-ACETAMINOPHEN 5-325 MG PO TABS
1.0000 | ORAL_TABLET | ORAL | Status: DC | PRN
  Administered 2020-07-08 – 2020-07-09 (×3): 2 via ORAL
  Filled 2020-07-08 (×3): qty 2

## 2020-07-08 MED ORDER — DIVALPROEX SODIUM ER 250 MG PO TB24
250.0000 mg | ORAL_TABLET | Freq: Every day | ORAL | Status: DC
  Administered 2020-07-09 – 2020-08-04 (×27): 250 mg via ORAL
  Filled 2020-07-08 (×29): qty 1

## 2020-07-08 MED ORDER — VITAMIN D (ERGOCALCIFEROL) 1.25 MG (50000 UNIT) PO CAPS
50000.0000 [IU] | ORAL_CAPSULE | ORAL | Status: DC
  Administered 2020-07-14 – 2020-08-04 (×4): 50000 [IU] via ORAL
  Filled 2020-07-08 (×4): qty 1

## 2020-07-08 MED ORDER — SENNOSIDES-DOCUSATE SODIUM 8.6-50 MG PO TABS
1.0000 | ORAL_TABLET | Freq: Two times a day (BID) | ORAL | Status: DC
  Administered 2020-07-08 – 2020-07-22 (×28): 1 via ORAL
  Filled 2020-07-08 (×28): qty 1

## 2020-07-08 MED ORDER — HEPARIN SODIUM (PORCINE) 5000 UNIT/ML IJ SOLN: 5000.0000 [IU] | Freq: Three times a day (TID) | INTRAMUSCULAR | Status: DC

## 2020-07-08 MED ORDER — ACETAMINOPHEN 325 MG PO TABS
650.0000 mg | ORAL_TABLET | Freq: Four times a day (QID) | ORAL | Status: DC | PRN
  Administered 2020-07-11 – 2020-08-03 (×9): 650 mg via ORAL
  Filled 2020-07-08 (×12): qty 2

## 2020-07-08 MED ORDER — ONDANSETRON HCL 4 MG/2ML IJ SOLN: 4.0000 mg | Freq: Four times a day (QID) | INTRAMUSCULAR | Status: DC | PRN

## 2020-07-08 MED ORDER — LOSARTAN POTASSIUM 50 MG PO TABS
100.0000 mg | ORAL_TABLET | Freq: Every day | ORAL | Status: DC
  Administered 2020-07-09 – 2020-07-11 (×3): 100 mg via ORAL
  Filled 2020-07-08 (×4): qty 2

## 2020-07-08 MED ORDER — LEVETIRACETAM 500 MG PO TABS
500.0000 mg | ORAL_TABLET | Freq: Two times a day (BID) | ORAL | Status: DC
  Administered 2020-07-08 – 2020-07-24 (×32): 500 mg via ORAL
  Filled 2020-07-08 (×32): qty 1

## 2020-07-08 MED ORDER — ONDANSETRON HCL 4 MG PO TABS
4.0000 mg | ORAL_TABLET | Freq: Four times a day (QID) | ORAL | Status: DC | PRN
  Administered 2020-07-29: 4 mg via ORAL
  Filled 2020-07-08: qty 1

## 2020-07-08 MED ORDER — DIVALPROEX SODIUM 125 MG PO CSDR
750.0000 mg | DELAYED_RELEASE_CAPSULE | Freq: Every day | ORAL | Status: DC
  Administered 2020-07-08 – 2020-07-11 (×4): 750 mg via ORAL
  Filled 2020-07-08 (×4): qty 6

## 2020-07-08 MED ORDER — TOPIRAMATE 25 MG PO TABS
75.0000 mg | ORAL_TABLET | Freq: Every day | ORAL | Status: DC
  Administered 2020-07-08 – 2020-08-03 (×27): 75 mg via ORAL
  Filled 2020-07-08 (×29): qty 3

## 2020-07-08 MED ORDER — ACETAMINOPHEN 650 MG RE SUPP: 650.0000 mg | Freq: Four times a day (QID) | RECTAL | Status: DC | PRN

## 2020-07-08 MED ORDER — ASPIRIN EC 81 MG PO TBEC
81.0000 mg | DELAYED_RELEASE_TABLET | Freq: Two times a day (BID) | ORAL | Status: DC
  Administered 2020-07-08 – 2020-08-04 (×53): 81 mg via ORAL
  Filled 2020-07-08 (×54): qty 1

## 2020-07-08 NOTE — Progress Notes (Signed)
Inpatient Rehabilitation Medication Review by a Pharmacist  A complete drug regimen review was completed for this patient to identify any potential clinically significant medication issues.  Clinically significant medication issues were identified:  yes   Type of Medication Issue Identified Description of Issue Urgent (address now) Non-Urgent (address on AM team rounds) Plan   Drug Interaction(s) (clinically significant)       Duplicate Therapy       Allergy       No Medication Administration End Date       Incorrect Dose       Additional Drug Therapy Needed       Other  -PTA HCTZ held -PTA Metformin held  -PTA Zolpidem PRN held  Non-urgent -Monitor BP, stop HCTZ indefinitely if not needed -Monitor BG and renal fx, consider restarting metformin -Restart zolpidem prn or consider melatonin if patient needs a sleep aid     Name of provider notified for urgent issues identified:   Provider Method of Notification:    For non-urgent medication issues to be resolved on team rounds tomorrow morning a [facility] Secure Chat Handoff was sent to: [place]e [place], [place]     Time spent performing this drug regimen review (minutes):  [place], PharmD, BCPS, [facility] Clinical Pharmacist  Please check AMION for all [place] phone numbers After 10:00 PM, call [place]

## 2020-07-08 NOTE — TOC Transition Note (Signed)
Transition of Care [facility]) - CM/SW Discharge Note   Patient Details  Name: Amanda Collier MRN: [id number] Date of Birth: 05-May-1946  [facility] CM/SW Contact:  Pollie Friar, RN Phone Number: 07/08/2020, 4:50 PM   Clinical Narrative:    Pt discharging to CIR. CM signing off.    Final next level of care: IP Rehab Facility Barriers to Discharge: No Barriers Identified   Patient Goals and CMS Choice        Discharge Placement                       Discharge Plan and Services                                     Social Determinants of Health (SDOH) Interventions     Readmission Risk Interventions No flowsheet data found.

## 2020-07-08 NOTE — H&P (Signed)
Physical Medicine and Rehabilitation Admission H&P    No chief complaint on file. : HPI: Amanda Collier is a 74 year old right-handed female with history of hypertension, diabetes mellitus and alcohol use as well as left nondisplaced fracture of left lateral malleolus and nondisplaced fracture of lateral malleolus right fibula sprain of tibiofibular ligament right ankle after recent fall 06/14/2020 followed by Dr. Louanne Skye.  Per chart review lives with spouse.  Two-level home bed and bath upstairs 5 steps to entry.  Reportedly independent prior to admission.  Presented 06/26/2020 with multiple falls and gait difficulties.  Recent MRI and imaging revealed large subacute subdural hematoma bilaterally with significant mass-effect on both cerebral hemispheres without midline shift.  Negative for acute infarct.  CT cervical thoracic spine with no fracture.  Patient underwent craniotomy hematoma evacuation of subdural hematoma 06/24/2020 per Dr. Christella Noa.  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  MRI of lumbar spine completed due to low back/sacral pain and lower extremity weakness 07/04/2020 showed subdural hemorrhage extending through the length of the visualized spine mainly dorsal and most notable in the lower thoracic segments and at the L5-S1 level.  The distal cord mildly deflected by hemorrhage but no cord signal abnormality identified.  Moderate flattening of the thecal sac at L5-S1 due to a disc bulge and subdural hemorrhage reviewed by neurology services advised conservative care consideration for trial of Neurontin.  Maintained on Depakote/Keppra for seizure prophylaxis.  Patient was cleared to begin subcutaneous heparin for DVT prophylaxis as well as resuming aspirin as prior to admission.  Bouts of urinary retention maintained on Urecholine.  Tolerating a regular consistency diet.  Patient is  weightbearing as tolerated lower extremities Cam boot on left and lace up ankle brace on right.  Therapy evaluations completed and patient was admitted for a comprehensive rehab program.  Pt has no control of stool- doesn't know when she's going- had multiple smears and small BM when examined today- no knowledge; urinary retention- occ in/out caths- on Urecholine for it currently.  Pain meds work well, but forgets to ask for meds- LBM today/this afternoon- required enema 2 days ago- blowout.  Per husband, cannot stay on topic enough to EAT!   Review of Systems  Constitutional: Negative for chills and fever.  HENT: Negative for hearing loss.   Eyes: Negative for blurred vision and double vision.  Respiratory: Negative for cough and shortness of breath.   Cardiovascular: Positive for leg swelling. Negative for chest pain.  Gastrointestinal: Positive for constipation. Negative for heartburn, nausea and vomiting.  Genitourinary: Negative for dysuria, flank pain and hematuria.  Musculoskeletal: Positive for falls and myalgias.  Skin: Negative for rash.  Neurological: Positive for weakness and headaches.  Psychiatric/Behavioral: Positive for depression. The patient has insomnia.        Anxiety  All other systems reviewed and are negative.  Past Medical History:  Diagnosis Date  . Headache syndrome 12/18/2016  . High blood pressure   . Hypertension    followed by Dr Gillian Shields medications  . Leg swelling    Hx of   Past Surgical History:  Procedure Laterality Date  . CRANIOTOMY Bilateral 06/24/2020   Procedure: Bilateral craniotomy for evacuation of subdural hematoma;  Surgeon: Ashok Pall, MD;  Location: [facility];  Service: Neurosurgery;  Laterality: Bilateral;  . CRANIOTOMY Right 06/26/2020   Procedure: CRANIOTOMY HEMATOMA EVACUATION SUBDURAL;  Surgeon: Ashok Pall, MD;  Location: [facility];  Service: Neurosurgery;  Laterality: Right;  . NO PAST SURGERIES     Family History    Adopted: Yes  Problem Relation Age of Onset  . Heart attack Maternal Uncle        ? unknown if correct/patient adopted   Social History:  reports that she has never smoked. She has never used smokeless tobacco. She reports current alcohol use of about 7.0 standard drinks of alcohol per week. She reports that she does not use drugs. Allergies:  Allergies  Allergen Reactions  . Codeine Nausea Only  . Lexapro [Escitalopram Oxalate] Other (See Comments)    GI upset  . Lisinopril Cough  . Lotensin [Benazepril Hcl] Other (See Comments)    Headaches   Medications Prior to Admission  Medication Sig Dispense Refill  . acetaminophen (TYLENOL) 500 MG tablet Take 1,000 mg by mouth every 6 (six) hours as needed (for pain).    Marland Kitchen aspirin 81 MG EC tablet Take 81 mg by mouth in the morning and at bedtime. Swallow whole.    Marland Kitchen atorvastatin (LIPITOR) 10 MG tablet Take 10 mg by mouth daily.    . clonazePAM (KLONOPIN) 0.5 MG tablet Take 0.5 mg by mouth at bedtime as needed (for sleep).     . divalproex (DEPAKOTE ER) 250 MG 24 hr tablet Take 250-750 mg by mouth See admin instructions. Take 250 mg by mouth in the morning and 750 mg at bedtime    . Divalproex Sodium (DEPAKOTE PO) Take 250 mg by mouth 3 (three) times daily. Can take up to three times daily  (Patient not taking: Reported on 06/23/2020)    . Docusate Sodium (COLACE PO) Take 1 tablet by mouth at bedtime. (Patient not taking: Reported on 06/23/2020)    . hydrochlorothiazide (HYDRODIURIL) 25 MG tablet Take 25 mg by mouth daily.    Marland Kitchen losartan (COZAAR) 100 MG tablet Take 100 mg by mouth in the morning.   1  . metFORMIN (GLUCOPHAGE-XR) 500 MG 24 hr tablet Take 500 mg by mouth daily before supper.    . topiramate (TOPAMAX) 50 MG tablet Take 1.5 tablets (75 mg total) by mouth at bedtime. 120 tablet 0  . Vitamin D, Ergocalciferol, (DRISDOL) 50000 UNITS CAPS Take 50,000 Units by mouth every Wednesday.     . zolpidem (AMBIEN) 10 MG tablet Take 10 mg by mouth  at bedtime as needed for sleep.      Drug Regimen Review Drug regimen was reviewed and remains appropriate with no significant issues identified  Home: Home Living Family/patient expects to be discharged to:: Private residence Living Arrangements: Spouse/significant other   Functional History:    Functional Status:  Mobility:          ADL:    Cognition: Cognition Orientation Level: Oriented X4    Physical Exam: Blood pressure 114/73, pulse 66, temperature 98 F (36.7 C), temperature source Oral, resp. rate 16, height 5\' 4"  (1.626 m), weight 74.8 kg, last menstrual period 11/14/2003, SpO2 91 %. Physical Exam Vitals and nursing note reviewed. Exam conducted with a chaperone present.  Constitutional:      Comments: Pt awake, tangential, can't stay on topic- husband at bedside, cordial, NAD; trying to eat lunch  HENT:     Head:     Comments: Craniotomy sites clean and dry B/L temporal areas of head; staples intact- no drainage Most of her head shaved- not back    Right Ear: External ear normal.     Left Ear: External ear normal.  Nose: Nose normal. No congestion.     Mouth/Throat:     Mouth: Mucous membranes are dry.     Pharynx: Oropharynx is clear. No oropharyngeal exudate.  Eyes:     Comments: EOMI B/L- no nystagmus seen  Cardiovascular:     Rate and Rhythm: Normal rate and regular rhythm.     Heart sounds: Normal heart sounds. No murmur heard.  No gallop.   Pulmonary:     Comments: CTA B/L- no W/R/R- good air movement  Abdominal:     Comments: Soft, NT, ND, (+)BS    Genitourinary:    Comments: purewick in place Musculoskeletal:     Comments: UEs 4+/5 in deltoid, biceps, triceps, WE, grip and finger abd B/L- decreased effort due to pain LEs- HF 3-/5 on R; 3/5 on L; KE 4+/5, KF 4/5, DF and PF 3/5 on R; 3-/5    Skin:    Comments: No skin breakdown on backside- small stool- unaware of it Heels normal- no bogginess or wounds Most of hair shaved due to  craniotomies  Neurological:     Comments: Patient is alert no acute distress.  Follows simple commands.  Provides her name and age.  She does display some decrease in overall awareness.  Cooperative with exam. Tangential, cannot stay focused on topic, even eating- word finding issues noted Light touch intact in all 4 extremities- c/o back pain  Psychiatric:     Comments: Tangential- difficulty with focus; bright     Results for orders placed or performed during the hospital encounter of 07/08/20 (from the past 48 hour(s))  CBC     Status: Abnormal   Collection Time: 07/08/20  5:52 PM  Result Value Ref Range   WBC 7.7 4.0 - 10.5 K/uL   RBC 3.53 (L) 3.87 - 5.11 MIL/uL   Hemoglobin 11.2 (L) 12.0 - 15.0 g/dL   HCT 33.4 (L) 36 - 46 %   MCV 94.6 80.0 - 100.0 fL   MCH 31.7 26.0 - 34.0 pg   MCHC 33.5 30.0 - 36.0 g/dL   RDW 11.9 11.5 - 15.5 %   Platelets 447 (H) 150 - 400 K/uL   nRBC 0.0 0.0 - 0.2 %    Comment: Performed at [facility], [address] [place], [place] [address]  Creatinine, serum     Status: None   Collection Time: 07/08/20  5:52 PM  Result Value Ref Range   Creatinine, Ser 0.71 0.44 - 1.00 mg/dL   GFR calc non Af Amer >60 >60 mL/min   GFR calc Af Amer >60 >60 mL/min    Comment: Performed at [facility] [address] [place], [place] [address]   No results found.     Medical Problem List and Plan: 1.  Decreased functional ability with gait abnormality secondary to large subacute subdural hematoma bilaterally with significant mass-effect.  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  -needs SLP for poor focus and tangential behavior  -patient may not shower until staples out- or can cover head  -ELOS/Goals: 2-3 weeks- goals Supervision to min A 2.  Antithrombotics: -DVT/anticoagulation: Subcutaneous heparin.  Check vascular  study  -antiplatelet therapy: Aspirin 81 mg twice daily 3. Pain Management/headaches: Hydrocodone as needed, Topamax 75 mg nightly, Depakote 250 mg daily and 750 mg nightly 4. Mood: Klonopin 0.5 mg nightly as needed  -antipsychotic agents: N/A 5. Neuropsych: This patient is capable  of making decisions on her own behalf. 6. Skin/Wound Care: Routine skin checks 7. Fluids/Electrolytes/Nutrition: Routine in and outs with follow-up chemistries 8.  Subdural hemorrhage extending throughout the length of the visualized spine mainly dorsal and most notable in the lower thoracic segments and at the L5-S1 level.  Conservative care consider trial of Neurontin. 9.  Seizure prophylaxis.  Keppra 500 mg twice daily 10.  Hypertension.  Cozaar 100 mg daily.  Monitor with increased mobility 11.  Hyperlipidemia.  Lipitor 12.  Neurogenic bladder.  Urecholine 10 mg 3 times daily.  Check PVR x3- has been retaining- might need Flomax 13.  History of alcohol use. 1-2 drinks/day per pt-  Provide counseling. 14.  Left nondisplaced fracture of left lateral malleolus and nondisplaced fracture of lateral malleolus right fibula sprain of tibiofibular ligament/right ankle after recent fall 06/14/2020.  Followed by Dr. Louanne Skye.  Weightbearing as tolerated bilateral lower extremities.  Cam boot on left and lace up ankle brace on right. 15. Neurogenic bowel- incontinent- might need bowel program.  16. Of note, pt forgets to ask for pain meds- might let nursing know to offer pain meds  Lavon Paganini. [place], PA-C 07/08/2020    I have personally performed a face to face diagnostic evaluation of this patient and formulated the key components of the plan.  Additionally, I have personally reviewed laboratory data, imaging studies, as well as relevant notes and concur with the physician assistant's documentation above.   The patient's status has not changed from the original H&P.  Any changes in documentation from the acute care chart have  been noted above.      Courtney Heys, MD 07/08/2020

## 2020-07-08 NOTE — Progress Notes (Addendum)
Inpatient Rehabilitation Admissions Coordinator  I met with patient and her spouse at bedside. We can admit to [facility] today. I have paged Dr. Cabbell to obtain d/c order as well as left message at his office.  I have alerted acute team and TOC . I will make the arrangements to admit once I confirm with Dr. Cabbell.  Barbara Boyette, RN, MSN Rehab Admissions Coordinator [phone] 07/08/2020 12:25 PM  

## 2020-07-08 NOTE — Discharge Summary (Signed)
Physician Discharge Summary  Patient ID: Amanda Collier MRN: [id number] DOB/AGE: [birth year] 74 y.o.  Admit date: 06/23/2020 Discharge date: 07/08/2020  Admission Diagnoses:Chronic subdural hematomas Cervical spinal stenosis   Discharge Diagnoses:  Active Problems:   Subdural hematoma, chronic (HCC)   Status post craniotomy   Subdural hematoma (HCC)   Controlled type 2 diabetes mellitus with hyperglycemia (HCC)   Alcohol abuse   Trauma   Spinal stenosis in cervical region   Discharged Condition: fair  Hospital Course: Amanda Collier was admitted and taken to the operating room for bilateral subdural hematoma evacuation. On the right side a small craniotomy was performed, on the left a burr hole with a drain was performed. Post op she had some seizure like activity with speech hesistancy, a facial droop, and dysarthric speech. Head ct showed fairly large recurrent acute subdural hematoma She was taken back to the or for a repeat hematoma evacuation. Post op there was some improvement. However she had over 1 litre of urine when catheterized in the operating room. She has had urinary incontinence for weeks, coinciding with her gait difficulties.  She has 4. Nondisplaced fracture of lateral malleolus of left fibula, initial encounter for closed fracture   5. Closed nondisplaced fracture of lateral malleolus of right fibula, initial encounter    Treated by Basil Dess. As a result she was prescribed boots for the fractures which have made walking difficult.  Treatments: surgery: as above  Discharge Exam: Blood pressure 104/66, pulse 63, temperature 98 F (36.7 C), temperature source Oral, resp. rate 18, height 5\' 4"  (1.626 m), weight 74.8 kg, last menstrual period 11/14/2003, SpO2 94 %. General appearance: alert, cooperative, appears stated age and mild distress  Disposition: Discharge disposition: [place] Not Defined      SUDURAL HEMATOMA  Allergies  as of 07/08/2020      Reactions   Codeine Nausea Only   Lexapro [escitalopram Oxalate] Other (See Comments)   GI upset   Lisinopril Cough   Lotensin [benazepril Hcl] Other (See Comments)   Headaches      Medication List    STOP taking these medications   Aleve 220 MG Caps Generic drug: Naproxen Sodium     TAKE these medications   acetaminophen 500 MG tablet Commonly known as: TYLENOL Take 1,000 mg by mouth every 6 (six) hours as needed (for pain).   aspirin 81 MG EC tablet Take 81 mg by mouth in the morning and at bedtime. Swallow whole.   atorvastatin 10 MG tablet Commonly known as: LIPITOR Take 10 mg by mouth daily.   clonazePAM 0.5 MG tablet Commonly known as: KLONOPIN Take 0.5 mg by mouth at bedtime as needed (for sleep).   COLACE PO Take 1 tablet by mouth at bedtime.   DEPAKOTE PO Take 250 mg by mouth 3 (three) times daily. Can take up to three times daily   divalproex 250 MG 24 hr tablet Commonly known as: DEPAKOTE ER Take 250-750 mg by mouth See admin instructions. Take 250 mg by mouth in the morning and 750 mg at bedtime   hydrochlorothiazide 25 MG tablet Commonly known as: HYDRODIURIL Take 25 mg by mouth daily.   losartan 100 MG tablet Commonly known as: COZAAR Take 100 mg by mouth in the morning.   metFORMIN 500 MG 24 hr tablet Commonly known as: GLUCOPHAGE-XR Take 500 mg by mouth daily before supper.   topiramate 50 MG tablet Commonly known as: Topamax Take 1.5 tablets (75 mg total) by  mouth at bedtime.   Vitamin D (Ergocalciferol) 1.25 MG (50000 UNIT) Caps capsule Commonly known as: DRISDOL Take 50,000 Units by mouth every Wednesday.   zolpidem 10 MG tablet Commonly known as: AMBIEN Take 10 mg by mouth at bedtime as needed for sleep.        SignedAshok Pall 07/08/2020, 3:44 PM

## 2020-07-08 NOTE — Progress Notes (Signed)
Amanda Gong, RN  Rehab Admission Coordinator  Physical Medicine and Rehabilitation  PMR Pre-admission      Signed  Date of Service:  07/02/2020  2:40 PM          Signed       Show:Clear all [x] Manual[x] Template[x] Copied  Added by: [x] Amanda Gong, RN  [] Hover for details PMR Admission Coordinator Pre-Admission Assessment   Patient: Amanda Collier is an 74 y.o., female MRN: [id number] DOB: Feb 19, 1946 Height: 5\' 4"  (162.6 cm) Weight: 74.8 kg                                                                                                                                                  Insurance Information HMO:     PPO:      PCP:      IPA:      80/20:      OTHER:  PRIMARY: Medicare a and b      Policy#: [id number]      Subscriber: pt Benefits:  Phone #: passport one online     Name: 8/20 Eff. Date: 11/13/2010     Deduct: $1950      Out of Pocket Max: none      Life Max: none  CIR: 1005      SNF: 20 full days Outpatient: 80%     Co-Pay: [place]: 1005      Co-Pay: none DME: 80%     Co-Pay: 20% Providers: pt choice  SECONDARY: AARP supplement      Policy#: [id number]   Financial Counselor:       Phone#:    The "Data Collection Information Summary" for patients in Inpatient Rehabilitation Facilities with attached "Privacy Act [place] Records" was provided and verbally reviewed with: Patient and Family   Emergency Contact Information Contact Information     Name Relation Home Work Mobile    Amanda Collier, Amanda Collier [phone]   [phone]       Current Medical History  Patient Admitting Diagnosis: SDH   History of Present Illness:Amanda Collier is a 74 year old right-handed female with history of hypertension, diabetes mellitus and alcohol use as well as left nondisplaced fracture of left lateral malleolus and nondisplaced fracture of lateral malleolus right fibula sprain of tibiofibular ligament right ankle after recent fall  06/14/2020 followed by Dr. Louanne Skye.   Presented 06/26/2020 with multiple falls and gait difficulties.  Recent MRI and imaging revealed large subacute subdural hematoma bilaterally with significant mass-effect on both cerebral hemispheres without midline shift.  Negative for acute infarct.  CT cervical thoracic spine with no fracture.  Patient underwent craniotomy hematoma evacuation of subdural hematoma 06/24/2020 per Dr. Christella Noa.  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  MRI of lumbar spine completed due to low back/sacral  pain and lower extremity weakness 07/04/2020 showed subdural hemorrhage extending through the length of the visualized spine mainly dorsal and most notable in the lower thoracic segments and at the L5-S1 level.  The distal cord mildly deflected by hemorrhage but no cord signal abnormality identified.  Moderate flattening of the thecal sac at L5-S1 due to a disc bulge and subdural hemorrhage reviewed by neurology services advised conservative care consideration for trial of Neurontin.  Maintained on Depakote/Keppra for seizure prophylaxis.  Patient was cleared to begin subcutaneous heparin for DVT prophylaxis as well as resuming aspirin as prior to admission.  Bouts of urinary retention maintained on Urecholine.  Tolerating a regular consistency diet.  Patient is weightbearing as tolerated lower extremities Cam boot on left and lace up ankle brace on right.   Past Medical History      Past Medical History:  Diagnosis Date  . Headache syndrome 12/18/2016  . High blood pressure    . Hypertension      followed by Dr Gillian Shields medications  . Leg swelling      Hx of      Family History  family history includes Heart attack in her maternal uncle. She was adopted.   Prior Rehab/Hospitalizations:  Has the patient had prior rehab or hospitalizations prior to admission? Yes   Has  the patient had major surgery during 100 days prior to admission? Yes   Current Medications    Current Facility-Administered Medications:  .  0.9 %  sodium chloride infusion, 250 mL, Intravenous, PRN, Ashok Pall, MD .  0.9 %  sodium chloride infusion, , Intravenous, Continuous, Ashok Pall, MD, Stopped at 06/30/20 2215 .  acetaminophen (TYLENOL) tablet 650 mg, 650 mg, Oral, Q6H PRN, 650 mg at 06/28/20 2117 **OR** acetaminophen (TYLENOL) suppository 650 mg, 650 mg, Rectal, Q6H PRN, Ashok Pall, MD .  aspirin EC tablet 81 mg, 81 mg, Oral, BID, Ashok Pall, MD, 81 mg at 07/08/20 0939 .  atorvastatin (LIPITOR) tablet 10 mg, 10 mg, Oral, Daily, Ashok Pall, MD, 10 mg at 07/08/20 1448 .  bethanechol (URECHOLINE) tablet 10 mg, 10 mg, Oral, TID, Ashok Pall, MD, 10 mg at 07/08/20 0937 .  Chlorhexidine Gluconate Cloth 2 % PADS 6 each, 6 each, Topical, Daily, Ashok Pall, MD, 6 each at 07/07/20 1153 .  clonazePAM (KLONOPIN) tablet 0.5 mg, 0.5 mg, Oral, QHS PRN, Ashok Pall, MD, 0.5 mg at 07/07/20 2146 .  divalproex (DEPAKOTE ER) 24 hr tablet 250 mg, 250 mg, Oral, Daily, Ashok Pall, MD, 250 mg at 07/08/20 1856 .  divalproex (DEPAKOTE SPRINKLE) capsule 750 mg, 750 mg, Oral, QHS, Cabbell, Kyle, MD, 750 mg at 07/07/20 2146 .  heparin injection 5,000 Units, 5,000 Units, Subcutaneous, Q8H, Ashok Pall, MD, 5,000 Units at 07/08/20 0535 .  HYDROcodone-acetaminophen (NORCO/VICODIN) 5-325 MG per tablet 1-2 tablet, 1-2 tablet, Oral, Q4H PRN, Ashok Pall, MD, 2 tablet at 07/08/20 [phone] .  labetalol (NORMODYNE) injection 10-40 mg, 10-40 mg, Intravenous, Q10 min PRN, Ashok Pall, MD .  levETIRAcetam (KEPPRA) tablet 500 mg, 500 mg, Oral, BID, Hammons, Kimberly B, RPH, 500 mg at 07/08/20 0938 .  losartan (COZAAR) tablet 100 mg, 100 mg, Oral, Daily, Ashok Pall, MD, 100 mg at 07/08/20 7026 .  MEDLINE mouth rinse, 15 mL, Mouth Rinse, BID, Cabbell, Kyle, MD, 15 mL at 07/08/20 0945 .  naloxone  (NARCAN) injection 0.08 mg, 0.08 mg, Intravenous, PRN, Ashok Pall, MD .  ondansetron (ZOFRAN) tablet 4 mg, 4 mg, Oral, Q6H PRN **OR** ondansetron (  ZOFRAN) injection 4 mg, 4 mg, Intravenous, Q6H PRN, Ashok Pall, MD, 4 mg at 07/08/20 0030 .  pantoprazole (PROTONIX) EC tablet 40 mg, 40 mg, Oral, QHS, Ashok Pall, MD, 40 mg at 07/07/20 2147 .  senna-docusate (Senokot-S) tablet 1 tablet, 1 tablet, Oral, BID, Ashok Pall, MD, 1 tablet at 07/07/20 1058 .  sodium chloride flush (NS) 0.9 % injection 3 mL, 3 mL, Intravenous, Q12H, Ashok Pall, MD, 3 mL at 07/08/20 0945 .  sodium chloride flush (NS) 0.9 % injection 3 mL, 3 mL, Intravenous, PRN, Ashok Pall, MD, 3 mL at 07/06/20 1002 .  topiramate (TOPAMAX) tablet 75 mg, 75 mg, Oral, QHS, Ashok Pall, MD, 75 mg at 07/07/20 2146 .  Vitamin D (Ergocalciferol) (DRISDOL) capsule 50,000 Units, 50,000 Units, Oral, Q Wed, [place], Marylyn Ishihara, MD, 50,000 Units at 07/07/20 1152 .  zolpidem (AMBIEN) tablet 5 mg, 5 mg, Oral, QHS PRN, Ashok Pall, MD, 5 mg at 07/08/20 0030   Patients Current Diet:     Diet Order                      Diet regular Room service appropriate? Yes; Fluid consistency: Thin  Diet effective now                      Precautions / Restrictions Precautions Precautions: Fall Precaution Comments: drains removed Other Brace: Cam boot L; lace up ankle brace R; has sneaker for rt foot Restrictions Weight Bearing Restrictions: Yes RLE Weight Bearing: Weight bearing as tolerated LLE Weight Bearing: Weight bearing as tolerated Other Position/Activity Restrictions: WBAT with braces per pt    Has the patient had 2 or more falls or a fall with injury in the past year?Yes  Initial fall 06/03/2020. Now has had 7 falls pta   Prior Activity Level Community (5-7x/wk): completely independent until 4 weeks pta when she had initial fall; drove   Prior Functional Level Prior Function Level of Independence: Independent Comments: Travesl  to [place] to live in summer, walks her dog and does not use assistive device at baseline, but spouse reports shuffling her feet a lot recently   Self Care: Did the patient need help bathing, dressing, using the toilet or eating?  Independent   Indoor Mobility: Did the patient need assistance with walking from room to room (with or without device)? Independent   Stairs: Did the patient need assistance with internal or external stairs (with or without device)? Independent   Functional Cognition: Did the patient need help planning regular tasks such as shopping or remembering to take medications? Independent   Home Assistive Devices / Equipment Home Assistive Devices/Equipment: Environmental consultant (specify type), Eyeglasses Home Equipment: Walker - 2 wheels, Cane - single point   Prior Device Use: Indicate devices/aids used by the patient prior to current illness, exacerbation or injury? None of the above   Current Functional Level Cognition   Arousal/Alertness: Awake/alert Overall Cognitive Status: Impaired/Different from baseline Current Attention Level: Sustained Orientation Level: Oriented X4 Following Commands: Follows one step commands consistently, Follows one step commands with increased time Safety/Judgement: Decreased awareness of safety, Decreased awareness of deficits General Comments: Patient can give detailed history of hospital events (including dates); decr awareness of ankle deficits as continues to ask to not wear boot/brace because they make it hard to balance (true however bil ankle fractures) Attention: Sustained Sustained Attention: Impaired Sustained Attention Impairment: Verbal basic, Functional basic Memory: Impaired Memory Impairment: Retrieval deficit, Decreased short term memory Decreased  Short Term Memory: Verbal basic Awareness: Impaired Awareness Impairment: Intellectual impairment, Emergent impairment Problem Solving: Impaired Problem Solving Impairment: Functional  basic Safety/Judgment: Impaired    Extremity Assessment (includes Sensation/Coordination)   Upper Extremity Assessment: Defer to OT evaluation LUE Deficits / Details: overall weaker than R; apparent motor planning/sensory motor deficits; trying to pick up cover adn she didn't have voer in hand;poor in-hand manipulation skills; clumsy hand; LUE Sensation: decreased light touch, decreased proprioception LUE Coordination: decreased fine motor (poor disdiadikokenesis)  Lower Extremity Assessment: LLE deficits/detail, RLE deficits/detail RLE Deficits / Details: AROM ankle DF about to neutral, hip flexion at least 3/5, knee extension 3+/5 LLE Deficits / Details: AROM ankle DF about -15 degrees from neurtral, hip flexion at least 3/5, knee extension 4-/5 though reports some pain with testing knee extension LLE Sensation: decreased proprioception     ADLs   Overall ADL's : Needs assistance/impaired Eating/Feeding: Minimal assistance Eating/Feeding Details (indicate cue type and reason): difficulty using L hand to assist wtih packages/ cutting Grooming: Set up, Bed level, Oral care, Wash/dry face, Wash/dry hands Upper Body Bathing: Minimal assistance, Sitting Lower Body Bathing: Moderate assistance, Bed level Upper Body Dressing : Moderate assistance, Bed level Lower Body Dressing: Total assistance, +2 for safety/equipment, +2 for physical assistance Lower Body Dressing Details (indicate cue type and reason): assist to don shoe and brace, cam boot; sit to stand max assist +2  Toilet Transfer: Total assistance, +2 for physical assistance, +2 for safety/equipment Toilet Transfer Details (indicate cue type and reason): using stedy  Functional mobility during ADLs: Maximal assistance, +2 for physical assistance, +2 for safety/equipment General ADL Comments: limited session due to pain and not wanting to attempt to get EOB due to pain     Mobility   Overal bed mobility: Needs Assistance Bed  Mobility: Rolling, Sidelying to Sit, Sit to Sidelying Rolling: Min assist (with rail rt and lt) Sidelying to sit: Mod assist, +2 for physical assistance, HOB elevated (HOB 20) Supine to sit: Mod assist, HOB elevated Sit to supine: Max assist Sit to sidelying: Mod assist, +2 for safety/equipment General bed mobility comments: Pt with HOB 20 degrees trying to eat breakfast; explained plan to get as close to EOB as possible (in sidelying)and with bed elevated, lower legs over EOB as simultaneously raising torso with intention to minimize hip/trunk flexion to minimize pain; pt agreeable, completed without reports of pain or wincing     Transfers   Overall transfer level: Needs assistance Equipment used: Rolling walker (2 wheeled) Transfer via Lift Equipment: Stedy Transfers: Sit to/from Stand Sit to Stand: Mod assist, +2 physical assistance, From elevated surface Stand pivot transfers: Total assist, +2 physical assistance General transfer comment: mod A up to stand with pt leaning posteriorly against bed, pt needing cues to extend hips; stood for 3 minutes with assisted weight-shifting left and right (and continued assist for anterior wt-shfit over BOS     Ambulation / Gait / Stairs / Wheelchair Mobility   Ambulation/Gait General Gait Details: pre-gait only; unable to advance to gait due to posterior lean     Posture / Balance Dynamic Sitting Balance Sitting balance - Comments: UE support needed for balance; no outside assist to maintain balance Balance Overall balance assessment: Needs assistance Sitting-balance support: Feet supported Sitting balance-Leahy Scale: Poor Sitting balance - Comments: UE support needed for balance; no outside assist to maintain balance Postural control: Right lateral lean Standing balance support: Bilateral upper extremity supported Standing balance-Leahy Scale: Poor Standing balance comment: up to  mod assist to stand due to posterior lean     Special  needs/care consideration Designated visitor is spouse, Dellis Filbert    Previous Home Environment  Living Arrangements: Spouse/significant other  Lives With: Spouse Available Help at Discharge: Family, Available 24 hours/day Type of Home: House Home Layout: Two level, Bed/bath upstairs, 1/2 bath on main level Alternate Level Stairs-Rails: Right, Left Alternate Level Stairs-Number of Steps: flight Home Access: Stairs to enter Entrance Stairs-Rails: Left Entrance Stairs-Number of Steps: 5 Bathroom Shower/Tub: Chiropodist: Handicapped height Bathroom Accessibility: Yes How Accessible: Accessible via walker [place]: No   Discharge Living Setting Plans for Discharge Living Setting: Patient's home, Lives with (comment) (spouse) Type of Home at Discharge: House Discharge Home Layout: Two level, Able to live on main level with bedroom/bathroom, 1/2 bath on main level Alternate Level Stairs-Rails: Right, Left Alternate Level Stairs-Number of Steps: flight Discharge Home Access: Stairs to enter Entrance Stairs-Rails: Left Entrance Stairs-Number of Steps: 5 Discharge Bathroom Shower/Tub: Tub/shower unit Discharge Bathroom Toilet: Handicapped height Discharge Bathroom Accessibility: Yes How Accessible: Accessible via walker Does the patient have any problems obtaining your medications?: No   Social/Family/Support Systems Patient Roles: Spouse, Parent Contact Information: spouse, Dellis Filbert Anticipated Caregiver: spouse Anticipated Ambulance person Information: see above Ability/Limitations of Caregiver: no limitations Caregiver Availability: 24/7 Discharge Plan Discussed with Primary Caregiver: Yes Is Caregiver In Agreement with Plan?: Yes Does Caregiver/Family have Issues with Lodging/Transportation while Pt is in Rehab?: No   Goals Patient/Family Goal for Rehab: min asisst with PT and OT Expected length of stay: ELOS 17 to 21 days Pt/Family Agrees to  Admission and willing to participate: Yes Program Orientation Provided & Reviewed with Pt/Caregiver Including Roles  & Responsibilities: Yes   Decrease burden of Care through IP rehab admission: n/a   Possible need for SNF placement upon discharge:not anticipated   Patient Condition: This patient's medical and functional status has changed since the consult dated: 07/02/2020 in which the Rehabilitation Physician determined and documented that the patient's condition is appropriate for intensive rehabilitative care in an inpatient rehabilitation facility. See "History of Present Illness" (above) for medical update. Functional changes are: max assist. Patient's medical and functional status update has been discussed with the Rehabilitation physician and patient remains appropriate for inpatient rehabilitation. Will admit to inpatient rehab today.   Preadmission Screen Completed By:  Cleatrice Burke, RN, 07/08/2020 12:33 PM ______________________________________________________________________   Discussed status with Dr. Dagoberto Ligas on 07/08/2020 at 1234 and received approval for admission today.   Admission Coordinator:  Cleatrice Burke, time 4580 Date 07/08/2020             Cosigned by: Courtney Heys, MD at 07/08/2020 12:36 PM  Revision History                          Note Details  Author Amanda Gong, RN File Time 07/08/2020 12:34 PM  Author Type Rehab Admission Coordinator Status Signed  Last Editor Amanda Gong, RN Service Physical Medicine and [place] # [phone] Admit Date 06/23/2020

## 2020-07-08 NOTE — Progress Notes (Signed)
[name], MD  Physician  Physical Medicine and Rehabilitation  Consult Note      Signed  Date of Service:  07/02/2020  6:01 AM      Related encounter: ED to Hosp-Admission (Current) from 06/23/2020 in [facility]      Signed      Expand All Collapse All  Show:Clear all [x] Manual[x] Template[] Copied  Added by: [x] Angiulli, Lavon Paganini, PA-C[x] [name], MD  [] Hover for details          Physical Medicine and Rehabilitation Consult Reason for Consult: Decreased functional mobility Referring Physician: Dr. Christella Noa   HPI: Amanda Collier is a 74 y.o. right-handed female with history of hypertension, diabetes mellitus, and alcohol use as well as nondisplaced fracture of left lateral malleolus and right fibula sprain of tibiofibular ligament after recent fall 06/14/2020.  History taken from chart review, patient, and husband.  Patient lives with spouse.  Two-level home bed and bath upstairs 5 steps to entry.  Reportedly independent prior to admission.  She presented on 06/26/2020 with multiple falls and gait abnormality.  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  Negative for acute infarct.  CT cervical thoracic spine with no fracture.  Patient underwent craniotomy hematoma evacuation of subdural hematoma 06/24/2020 per Dr. Christella Noa.  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  Maintained on Depakote for seizure prophylaxis.  Patient was cleared to begin subcutaneous heparin for DVT prophylaxis.  Hospital course further complicated by urinary retention, urecholine initiated.  Tolerating mechanical soft diet.  Patient is weightbearing as tolerated lower extremities Cam boot on left and lace up ankle brace on right.  Therapy evaluations completed with recommendations of physical  medicine rehab consult.  Patient continues to undergo neurosurgical work-up with?  Further surgical intervention per husband.   Review of Systems  Constitutional: Positive for malaise/fatigue. Negative for chills and fever.  HENT: Negative for hearing loss.   Eyes: Negative for blurred vision and double vision.  Respiratory: Negative for cough and shortness of breath.   Cardiovascular: Negative for chest pain and leg swelling.  Gastrointestinal: Positive for constipation. Negative for heartburn, nausea and vomiting.  Genitourinary: Negative for dysuria, flank pain and hematuria.  Musculoskeletal: Positive for falls, joint pain and myalgias.  Skin: Negative for rash.  Neurological: Positive for weakness and headaches. Negative for sensory change and speech change.  All other systems reviewed and are negative.       Past Medical History:  Diagnosis Date  . Headache syndrome 12/18/2016  . High blood pressure    . Hypertension      followed by Dr Gillian Shields medications  . Leg swelling      Hx of         Past Surgical History:  Procedure Laterality Date  . CRANIOTOMY Bilateral 06/24/2020    Procedure: Bilateral craniotomy for evacuation of subdural hematoma;  Surgeon: Ashok Pall, MD;  Location: [facility];  Service: Neurosurgery;  Laterality: Bilateral;  . CRANIOTOMY Right 06/26/2020    Procedure: CRANIOTOMY HEMATOMA EVACUATION SUBDURAL;  Surgeon: Ashok Pall, MD;  Location: [facility];  Service: Neurosurgery;  Laterality: Right;  . NO PAST SURGERIES             Family History  Adopted: Yes  Problem Relation Age of Onset  . Heart attack Maternal Uncle          ?  unknown if correct/patient adopted    Social History:  reports that she has never smoked. She has never used smokeless tobacco. She reports current alcohol use of about 7.0 standard drinks of alcohol per week. She reports that she does not use drugs. Allergies:       Allergies  Allergen Reactions  . Codeine Nausea  Only  . Lexapro [Escitalopram Oxalate] Other (See Comments)      GI upset  . Lisinopril Cough  . Lotensin [Benazepril Hcl] Other (See Comments)      Headaches          Medications Prior to Admission  Medication Sig Dispense Refill  . acetaminophen (TYLENOL) 500 MG tablet Take 1,000 mg by mouth every 6 (six) hours as needed (for pain).      Marland Kitchen aspirin 81 MG EC tablet Take 81 mg by mouth in the morning and at bedtime. Swallow whole.      Marland Kitchen atorvastatin (LIPITOR) 10 MG tablet Take 10 mg by mouth daily.      . clonazePAM (KLONOPIN) 0.5 MG tablet Take 0.5 mg by mouth at bedtime as needed (for sleep).       . divalproex (DEPAKOTE ER) 250 MG 24 hr tablet Take 250-750 mg by mouth See admin instructions. Take 250 mg by mouth in the morning and 750 mg at bedtime      . losartan (COZAAR) 100 MG tablet Take 100 mg by mouth in the morning.    1  . metFORMIN (GLUCOPHAGE-XR) 500 MG 24 hr tablet Take 500 mg by mouth daily before supper.      . topiramate (TOPAMAX) 50 MG tablet Take 1.5 tablets (75 mg total) by mouth at bedtime. 120 tablet 0  . Vitamin D, Ergocalciferol, (DRISDOL) 50000 UNITS CAPS Take 50,000 Units by mouth every Wednesday.       . zolpidem (AMBIEN) 10 MG tablet Take 10 mg by mouth at bedtime as needed for sleep.      . Divalproex Sodium (DEPAKOTE PO) Take 250 mg by mouth 3 (three) times daily. Can take up to three times daily  (Patient not taking: Reported on 06/23/2020)      . Docusate Sodium (COLACE PO) Take 1 tablet by mouth at bedtime. (Patient not taking: Reported on 06/23/2020)      . hydrochlorothiazide (HYDRODIURIL) 25 MG tablet Take 25 mg by mouth daily.      . Naproxen Sodium (ALEVE) 220 MG CAPS Take by mouth daily. (Patient not taking: Reported on 06/23/2020)          Home: Home Living Family/patient expects to be discharged to:: Private residence Living Arrangements: Spouse/significant other Available Help at Discharge: Family, Available 24 hours/day Type of Home: House Home  Access: Stairs to enter [employer] of Steps: 5 Entrance Stairs-Rails: Left Home Layout: Two level, Bed/bath upstairs, 1/2 bath on main level Alternate Level Stairs-Number of Steps: flight Alternate Level Stairs-Rails: Right, Left (rail half way up one side then switches to other side) Bathroom Shower/Tub: Chiropodist: Handicapped height Bathroom Accessibility: No Home Equipment: Environmental consultant - 2 wheels, Cane - single point  Lives With: Spouse  Functional History: Prior Function Level of Independence: Independent Comments: [place] to [place] to live in summer, walks her dog and does not use assistive device at baseline, but spouse reports shuffling her feet a lot recently Functional Status:  Mobility: Bed Mobility Overal bed mobility: Needs Assistance Bed Mobility: Rolling, Sidelying to Sit Rolling: Min assist Sidelying to sit: Mod assist, HOB  elevated Supine to sit: Mod assist, HOB elevated Sit to supine: Max assist General bed mobility comments: cues for rail use, encouragement as she repeats that her arms are weak; guided legs off the bed and some assist to lift trunk increased time Transfers Overall transfer level: Needs assistance Equipment used: Rolling walker (2 wheeled) Transfer via Lift Equipment: Stedy Transfers: Sit to/from Stand, [employer] Transfers Sit to Stand: Mod assist, +2 physical assistance Stand pivot transfers: Total assist, +2 physical assistance General transfer comment: mod A up to stand to Liborio Negrin Torres, pt needing cues to extend trunk and for midline positioning as leaning to R; used Stedy to transition to recliner   ADL: ADL Overall ADL's : Needs assistance/impaired Eating/Feeding: Minimal assistance Eating/Feeding Details (indicate cue type and reason): difficulty using L hand to assist wtih packages/ cutting Grooming: Minimal assistance Upper Body Bathing: Minimal assistance, Sitting Lower Body Bathing: Moderate assistance, Bed  level Upper Body Dressing : Moderate assistance, Bed level Lower Body Dressing: Total assistance, +2 for safety/equipment, +2 for physical assistance Lower Body Dressing Details (indicate cue type and reason): assist to don shoe and brace, cam boot; sit to stand max assist +2  Toilet Transfer: Total assistance, +2 for physical assistance, +2 for safety/equipment Toilet Transfer Details (indicate cue type and reason): using stedy  Functional mobility during ADLs: Maximal assistance, +2 for physical assistance, +2 for safety/equipment General ADL Comments: patient remains limited by cognition, impaired balance and weakness    Cognition: Cognition Overall Cognitive Status: Impaired/Different from baseline Arousal/Alertness: Awake/alert Orientation Level: Oriented X4 Attention: Sustained Sustained Attention: Impaired Sustained Attention Impairment: Verbal basic, Functional basic Memory: Impaired Memory Impairment: Retrieval deficit, Decreased short term memory Decreased Short Term Memory: Verbal basic Awareness: Impaired Awareness Impairment: Intellectual impairment, Emergent impairment Problem Solving: Impaired Problem Solving Impairment: Functional basic Safety/Judgment: Impaired Cognition Arousal/Alertness: Awake/alert Behavior During Therapy: Anxious Overall Cognitive Status: Impaired/Different from baseline Area of Impairment: Attention, Memory, Following commands, Safety/judgement, Problem solving Current Attention Level: Sustained Memory: Decreased short-term memory Following Commands: Follows one step commands consistently, Follows one step commands with increased time Safety/Judgement: Decreased awareness of safety Awareness: Emergent Problem Solving: Slow processing, Requires verbal cues General Comments: repeating herself frequently information already relayed   Blood pressure (!) 142/68, pulse 68, temperature 98.2 F (36.8 C), temperature source Oral, resp. rate 14,  weight 74.8 kg, last menstrual period 11/14/2003, SpO2 100 %. Physical Exam Vitals reviewed.  Constitutional:      General: She is not in acute distress.    Appearance: She is obese.  HENT:     Head:     Comments: Bilateral craniotomy sites.  Normocephalic.    Right Ear: External ear normal.     Left Ear: External ear normal.     Nose: Nose normal.  Eyes:     General:        Right eye: No discharge.        Left eye: No discharge.     Extraocular Movements: Extraocular movements intact.  Cardiovascular:     Rate and Rhythm: Normal rate and regular rhythm.  Pulmonary:     Effort: Pulmonary effort is normal. No respiratory distress.     Breath sounds: Normal breath sounds. No stridor.  Abdominal:     General: Abdomen is flat. Bowel sounds are normal. There is no distension.  Musculoskeletal:     Cervical back: Normal range of motion and neck supple.     Comments: No edema or tenderness in extremities  Skin:  Comments: Bilateral cranial staples CDI  Neurological:     Mental Status: She is alert.     Comments: Alert and oriented  she does exhibit some decrease in awareness.   Follows simple commands. Motor: Bilateral upper extremities: 5/5 proximal distal Bilateral lower extremities: Hip flexion, knee extension 3/5 (some pain patient), ankle dorsiflexion 4/5 Sensation intact light touch  Psychiatric:        Mood and Affect: Mood normal.        Behavior: Behavior normal.        Lab Results Last 24 Hours  No results found for this or any previous visit (from the past 24 hour(s)).   Imaging Results (Last 48 hours)  No results found.     Assessment/Plan: Diagnosis: SDH Labs independently reviewed.  Records reviewed and summated above.   1. Does the need for close, 24 hr/day medical supervision in concert with the patient's rehab needs make it unreasonable for this patient to be served in a less intensive setting? Yes 2. Co-Morbidities requiring supervision/potential  complications: HTN (monitor and provide prns in accordance with increased physical exertion and pain), DM with hyperglycemia (Monitor in accordance with exercise and adjust meds as necessary), alcohol use, nondisplaced fracture of left lateral malleolus, ?cervical stenosis (?further surgical intervention per neurosurgery), and right fibula sprain of tibiofibular ligament, hypokalemia (continue to monitor and replete as necessary) 3. Due to bladder management, bowel management, safety, skin/wound care, disease management, medication administration, pain management and patient education, does the patient require 24 hr/day rehab nursing? Yes 4. Does the patient require coordinated care of a physician, rehab nurse, therapy disciplines of PT/OT to address physical and functional deficits in the context of the above medical diagnosis(es)? Yes Addressing deficits in the following areas: balance, endurance, locomotion, strength, transferring, bathing, dressing, toileting and psychosocial support 5. Can the patient actively participate in an intensive therapy program of at least 3 hrs of therapy per day at least 5 days per week? Yes 6. The potential for patient to make measurable gains while on inpatient rehab is excellent 7. Anticipated functional outcomes upon discharge from inpatient rehab are min assist and mod assist  with PT, min assist and mod assist with OT, n/a with SLP. 8. Estimated rehab length of stay to reach the above functional goals is: 17-21 days. 9. Anticipated discharge destination: Home 10. Overall Rehab/Functional Prognosis: good   RECOMMENDATIONS: This patient's condition is appropriate for continued rehabilitative care in the following setting: CIR if no further surgical intervention planned by neurosurgery. Patient has agreed to participate in recommended program. Yes Note that insurance prior authorization may be required for reimbursement for recommended care.   Comment: Rehab  Admissions Coordinator to follow up.   I have personally performed a face to face diagnostic evaluation, including, but not limited to relevant history and physical exam findings, of this patient and developed relevant assessment and plan.  Additionally, I have reviewed and concur with the physician assistant's documentation above.    Delice Lesch, MD, ABPMR Cathlyn Parsons, PA-C 07/02/2020        Revision History                     Routing History           Note Details  Author [name], MD File Time 07/02/2020 12:30 PM  Author Type Physician Status Signed  Last Editor [name], MD Service Physical Medicine and Rehabilitation

## 2020-07-08 NOTE — Progress Notes (Signed)
Patient arrived on unit, oriented to unit. Reviewed medications, therapy schedule, rehab routine and plan of care. States an understanding of information reviewed. No complications noted at this time. Patient reports no pain and is AX4 Amanda Collier L [name]  

## 2020-07-08 NOTE — Progress Notes (Signed)
Amanda Arn, MD  Physician  Physical Medicine and Rehabilitation  Consult Note      Signed  Date of Service:  07/02/2020  6:01 AM          Signed      Expand All Collapse All  Show:Clear all [x] Manual[x] Template[] Copied  Added by: [x] Angiulli, Lavon Paganini, PA-C[x] Amanda Arn, MD  [] Hover for details          Physical Medicine and Rehabilitation Consult Reason for Consult: Decreased functional mobility Referring Physician: Dr. Christella Noa   HPI: Amanda Collier is a 74 y.o. right-handed female with history of hypertension, diabetes mellitus, and alcohol use as well as nondisplaced fracture of left lateral malleolus and right fibula sprain of tibiofibular ligament after recent fall 06/14/2020.  History taken from chart review, patient, and husband.  Patient lives with spouse.  Two-level home bed and bath upstairs 5 steps to entry.  Reportedly independent prior to admission.  She presented on 06/26/2020 with multiple falls and gait abnormality.  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  Negative for acute infarct.  CT cervical thoracic spine with no fracture.  Patient underwent craniotomy hematoma evacuation of subdural hematoma 06/24/2020 per Dr. Christella Noa.  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  Maintained on Depakote for seizure prophylaxis.  Patient was cleared to begin subcutaneous heparin for DVT prophylaxis.  Hospital course further complicated by urinary retention, urecholine initiated.  Tolerating mechanical soft diet.  Patient is weightbearing as tolerated lower extremities Cam boot on left and lace up ankle brace on right.  Therapy evaluations completed with recommendations of physical medicine rehab consult.  Patient continues to undergo neurosurgical work-up with?  Further surgical  intervention per husband.   Review of Systems  Constitutional: Positive for malaise/fatigue. Negative for chills and fever.  HENT: Negative for hearing loss.   Eyes: Negative for blurred vision and double vision.  Respiratory: Negative for cough and shortness of breath.   Cardiovascular: Negative for chest pain and leg swelling.  Gastrointestinal: Positive for constipation. Negative for heartburn, nausea and vomiting.  Genitourinary: Negative for dysuria, flank pain and hematuria.  Musculoskeletal: Positive for falls, joint pain and myalgias.  Skin: Negative for rash.  Neurological: Positive for weakness and headaches. Negative for sensory change and speech change.  All other systems reviewed and are negative.       Past Medical History:  Diagnosis Date  . Headache syndrome 12/18/2016  . High blood pressure    . Hypertension      followed by Dr Gillian Shields medications  . Leg swelling      Hx of         Past Surgical History:  Procedure Laterality Date  . CRANIOTOMY Bilateral 06/24/2020    Procedure: Bilateral craniotomy for evacuation of subdural hematoma;  Surgeon: Ashok Pall, MD;  Location: [facility];  Service: Neurosurgery;  Laterality: Bilateral;  . CRANIOTOMY Right 06/26/2020    Procedure: CRANIOTOMY HEMATOMA EVACUATION SUBDURAL;  Surgeon: Ashok Pall, MD;  Location: [facility];  Service: Neurosurgery;  Laterality: Right;  . NO PAST SURGERIES             Family History  Adopted: Yes  Problem Relation Age of Onset  . Heart attack Maternal Uncle          ? unknown if correct/patient adopted    Social History:  reports  that she has never smoked. She has never used smokeless tobacco. She reports current alcohol use of about 7.0 standard drinks of alcohol per week. She reports that she does not use drugs. Allergies:       Allergies  Allergen Reactions  . Codeine Nausea Only  . Lexapro [Escitalopram Oxalate] Other (See Comments)      GI upset  . Lisinopril Cough  .  Lotensin [Benazepril Hcl] Other (See Comments)      Headaches          Medications Prior to Admission  Medication Sig Dispense Refill  . acetaminophen (TYLENOL) 500 MG tablet Take 1,000 mg by mouth every 6 (six) hours as needed (for pain).      Marland Kitchen aspirin 81 MG EC tablet Take 81 mg by mouth in the morning and at bedtime. Swallow whole.      Marland Kitchen atorvastatin (LIPITOR) 10 MG tablet Take 10 mg by mouth daily.      . clonazePAM (KLONOPIN) 0.5 MG tablet Take 0.5 mg by mouth at bedtime as needed (for sleep).       . divalproex (DEPAKOTE ER) 250 MG 24 hr tablet Take 250-750 mg by mouth See admin instructions. Take 250 mg by mouth in the morning and 750 mg at bedtime      . losartan (COZAAR) 100 MG tablet Take 100 mg by mouth in the morning.    1  . metFORMIN (GLUCOPHAGE-XR) 500 MG 24 hr tablet Take 500 mg by mouth daily before supper.      . topiramate (TOPAMAX) 50 MG tablet Take 1.5 tablets (75 mg total) by mouth at bedtime. 120 tablet 0  . Vitamin D, Ergocalciferol, (DRISDOL) 50000 UNITS CAPS Take 50,000 Units by mouth every Wednesday.       . zolpidem (AMBIEN) 10 MG tablet Take 10 mg by mouth at bedtime as needed for sleep.      . Divalproex Sodium (DEPAKOTE PO) Take 250 mg by mouth 3 (three) times daily. Can take up to three times daily  (Patient not taking: Reported on 06/23/2020)      . Docusate Sodium (COLACE PO) Take 1 tablet by mouth at bedtime. (Patient not taking: Reported on 06/23/2020)      . hydrochlorothiazide (HYDRODIURIL) 25 MG tablet Take 25 mg by mouth daily.      . Naproxen Sodium (ALEVE) 220 MG CAPS Take by mouth daily. (Patient not taking: Reported on 06/23/2020)          Home: Home Living Family/patient expects to be discharged to:: Private residence Living Arrangements: Spouse/significant other Available Help at Discharge: Family, Available 24 hours/day Type of Home: House Home Access: Stairs to enter [employer] of Steps: 5 Entrance Stairs-Rails: Left Home  Layout: Two level, Bed/bath upstairs, 1/2 bath on main level Alternate Level Stairs-Number of Steps: flight Alternate Level Stairs-Rails: Right, Left (rail half way up one side then switches to other side) Bathroom Shower/Tub: Chiropodist: Handicapped height Bathroom Accessibility: No Home Equipment: Environmental consultant - 2 wheels, Cane - single point  Lives With: Spouse  Functional History: Prior Function Level of Independence: Independent Comments: Travesl to [place] to live in summer, walks her dog and does not use assistive device at baseline, but spouse reports shuffling her feet a lot recently Functional Status:  Mobility: Bed Mobility Overal bed mobility: Needs Assistance Bed Mobility: Rolling, Sidelying to Sit Rolling: Min assist Sidelying to sit: Mod assist, HOB elevated Supine to sit: Mod assist, HOB elevated Sit to supine:  Max assist General bed mobility comments: cues for rail use, encouragement as she repeats that her arms are weak; guided legs off the bed and some assist to lift trunk increased time Transfers Overall transfer level: Needs assistance Equipment used: Rolling walker (2 wheeled) Transfer via Lift Equipment: Stedy Transfers: Sit to/from Stand, [employer] Transfers Sit to Stand: Mod assist, +2 physical assistance Stand pivot transfers: Total assist, +2 physical assistance General transfer comment: mod A up to stand to [place], pt needing cues to extend trunk and for midline positioning as leaning to R; used Stedy to transition to recliner   ADL: ADL Overall ADL's : Needs assistance/impaired Eating/Feeding: Minimal assistance Eating/Feeding Details (indicate cue type and reason): difficulty using L hand to assist wtih packages/ cutting Grooming: Minimal assistance Upper Body Bathing: Minimal assistance, Sitting Lower Body Bathing: Moderate assistance, Bed level Upper Body Dressing : Moderate assistance, Bed level Lower Body Dressing: Total  assistance, +2 for safety/equipment, +2 for physical assistance Lower Body Dressing Details (indicate cue type and reason): assist to don shoe and brace, cam boot; sit to stand max assist +2  Toilet Transfer: Total assistance, +2 for physical assistance, +2 for safety/equipment Toilet Transfer Details (indicate cue type and reason): using stedy  Functional mobility during ADLs: Maximal assistance, +2 for physical assistance, +2 for safety/equipment General ADL Comments: patient remains limited by cognition, impaired balance and weakness    Cognition: Cognition Overall Cognitive Status: Impaired/Different from baseline Arousal/Alertness: Awake/alert Orientation Level: Oriented X4 Attention: Sustained Sustained Attention: Impaired Sustained Attention Impairment: Verbal basic, Functional basic Memory: Impaired Memory Impairment: Retrieval deficit, Decreased short term memory Decreased Short Term Memory: Verbal basic Awareness: Impaired Awareness Impairment: Intellectual impairment, Emergent impairment Problem Solving: Impaired Problem Solving Impairment: Functional basic Safety/Judgment: Impaired Cognition Arousal/Alertness: Awake/alert Behavior During Therapy: Anxious Overall Cognitive Status: Impaired/Different from baseline Area of Impairment: Attention, Memory, Following commands, Safety/judgement, Problem solving Current Attention Level: Sustained Memory: Decreased short-term memory Following Commands: Follows one step commands consistently, Follows one step commands with increased time Safety/Judgement: Decreased awareness of safety Awareness: Emergent Problem Solving: Slow processing, Requires verbal cues General Comments: repeating herself frequently information already relayed   Blood pressure (!) 142/68, pulse 68, temperature 98.2 F (36.8 C), temperature source Oral, resp. rate 14, weight 74.8 kg, last menstrual period 11/14/2003, SpO2 100 %. Physical Exam Vitals  reviewed.  Constitutional:      General: She is not in acute distress.    Appearance: She is obese.  HENT:     Head:     Comments: Bilateral craniotomy sites.  Normocephalic.    Right Ear: External ear normal.     Left Ear: External ear normal.     Nose: Nose normal.  Eyes:     General:        Right eye: No discharge.        Left eye: No discharge.     Extraocular Movements: Extraocular movements intact.  Cardiovascular:     Rate and Rhythm: Normal rate and regular rhythm.  Pulmonary:     Effort: Pulmonary effort is normal. No respiratory distress.     Breath sounds: Normal breath sounds. No stridor.  Abdominal:     General: Abdomen is flat. Bowel sounds are normal. There is no distension.  Musculoskeletal:     Cervical back: Normal range of motion and neck supple.     Comments: No edema or tenderness in extremities  Skin:    Comments: Bilateral cranial staples CDI  Neurological:  Mental Status: She is alert.     Comments: Alert and oriented  she does exhibit some decrease in awareness.   Follows simple commands. Motor: Bilateral upper extremities: 5/5 proximal distal Bilateral lower extremities: Hip flexion, knee extension 3/5 (some pain patient), ankle dorsiflexion 4/5 Sensation intact light touch  Psychiatric:        Mood and Affect: Mood normal.        Behavior: Behavior normal.        Lab Results Last 24 Hours  No results found for this or any previous visit (from the past 24 hour(s)).   Imaging Results (Last 48 hours)  No results found.     Assessment/Plan: Diagnosis: SDH Labs independently reviewed.  Records reviewed and summated above.   1. Does the need for close, 24 hr/day medical supervision in concert with the patient's rehab needs make it unreasonable for this patient to be served in a less intensive setting? Yes 2. Co-Morbidities requiring supervision/potential complications: HTN (monitor and provide prns in accordance with increased physical  exertion and pain), DM with hyperglycemia (Monitor in accordance with exercise and adjust meds as necessary), alcohol use, nondisplaced fracture of left lateral malleolus, ?cervical stenosis (?further surgical intervention per neurosurgery), and right fibula sprain of tibiofibular ligament, hypokalemia (continue to monitor and replete as necessary) 3. Due to bladder management, bowel management, safety, skin/wound care, disease management, medication administration, pain management and patient education, does the patient require 24 hr/day rehab nursing? Yes 4. Does the patient require coordinated care of a physician, rehab nurse, therapy disciplines of PT/OT to address physical and functional deficits in the context of the above medical diagnosis(es)? Yes Addressing deficits in the following areas: balance, endurance, locomotion, strength, transferring, bathing, dressing, toileting and psychosocial support 5. Can the patient actively participate in an intensive therapy program of at least 3 hrs of therapy per day at least 5 days per week? Yes 6. The potential for patient to make measurable gains while on inpatient rehab is excellent 7. Anticipated functional outcomes upon discharge from inpatient rehab are min assist and mod assist  with PT, min assist and mod assist with OT, n/a with SLP. 8. Estimated rehab length of stay to reach the above functional goals is: 17-21 days. 9. Anticipated discharge destination: Home 10. Overall Rehab/Functional Prognosis: good   RECOMMENDATIONS: This patient's condition is appropriate for continued rehabilitative care in the following setting: CIR if no further surgical intervention planned by neurosurgery. Patient has agreed to participate in recommended program. Yes Note that insurance prior authorization may be required for reimbursement for recommended care.   Comment: Rehab Admissions Coordinator to follow up.   I have personally performed a face to face  diagnostic evaluation, including, but not limited to relevant history and physical exam findings, of this patient and developed relevant assessment and plan.  Additionally, I have reviewed and concur with the physician assistant's documentation above.    Delice Lesch, MD, ABPMR Cathlyn Parsons, PA-C 07/02/2020        Revision History                     Routing History           Note Details  Author Amanda Arn, MD File Time 07/02/2020 12:30 PM  Author Type Physician Status Signed  Last Editor Amanda Arn, MD Service Physical Medicine and [place] # [phone] Admit Date 06/23/2020

## 2020-07-08 NOTE — Progress Notes (Signed)
Amanda Gong, RN  Rehab Admission Coordinator  Physical Medicine and Rehabilitation  PMR Pre-admission      Signed  Date of Service:  07/02/2020  2:40 PM          Signed       Show:Clear all [x] Manual[x] Template[x] Copied  Added by: [x] Amanda Gong, RN  [] Hover for details PMR Admission Coordinator Pre-Admission Assessment   Patient: Amanda Collier is an 74 y.o., female MRN: [id number] DOB: [birth year] Height: 5\' 4"  (162.6 cm) Weight: 74.8 kg                                                                                                                                                  Insurance Information HMO:     PPO:      PCP:      IPA:      80/20:      OTHER:  PRIMARY: Medicare a and b      Policy#: [id number]      Subscriber: pt Benefits:  Phone #: passport one online     Name: 8/20 Eff. Date: 11/13/2010     Deduct: $2637      Out of Pocket Max: none      Life Max: none  CIR: 1005      SNF: 20 full days Outpatient: 80%     Co-Pay: [place]: 1005      Co-Pay: none DME: 80%     Co-Pay: 20% Providers: pt choice  SECONDARY: AARP supplement      Policy#: [id number]   Financial Counselor:       Phone#:    The "Data Collection Information Summary" for patients in Inpatient Rehabilitation Facilities with attached "Privacy Act [place] Records" was provided and verbally reviewed with: Patient and Family   Emergency Contact Information Contact Information     Name Relation Home Work Mobile    Ros[place], [place] [phone]   [phone]       Current Medical History  Patient Admitting Diagnosis: SDH   History of Present Illness:Amanda Collier is a 74 year old right-handed female with history of hypertension, diabetes mellitus and alcohol use as well as left nondisplaced fracture of left lateral malleolus and nondisplaced fracture of lateral malleolus right fibula sprain of tibiofibular ligament right ankle after recent fall  06/14/2020 followed by Dr. Louanne Skye.   Presented 06/26/2020 with multiple falls and gait difficulties.  Recent MRI and imaging revealed large subacute subdural hematoma bilaterally with significant mass-effect on both cerebral hemispheres without midline shift.  Negative for acute infarct.  CT cervical thoracic spine with no fracture.  Patient underwent craniotomy hematoma evacuation of subdural hematoma 06/24/2020 per Dr. Christella Noa.  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  MRI of lumbar spine completed due to low back/sacral  pain and lower extremity weakness 07/04/2020 showed subdural hemorrhage extending through the length of the visualized spine mainly dorsal and most notable in the lower thoracic segments and at the L5-S1 level.  The distal cord mildly deflected by hemorrhage but no cord signal abnormality identified.  Moderate flattening of the thecal sac at L5-S1 due to a disc bulge and subdural hemorrhage reviewed by neurology services advised conservative care consideration for trial of Neurontin.  Maintained on Depakote/Keppra for seizure prophylaxis.  Patient was cleared to begin subcutaneous heparin for DVT prophylaxis as well as resuming aspirin as prior to admission.  Bouts of urinary retention maintained on Urecholine.  Tolerating a regular consistency diet.  Patient is weightbearing as tolerated lower extremities Cam boot on left and lace up ankle brace on right.   Past Medical History      Past Medical History:  Diagnosis Date  . Headache syndrome 12/18/2016  . High blood pressure    . Hypertension      followed by Dr Gillian Shields medications  . Leg swelling      Hx of      Family History  family history includes Heart attack in her maternal uncle. She was adopted.   Prior Rehab/Hospitalizations:  Has the patient had prior rehab or hospitalizations prior to admission? Yes   Has  the patient had major surgery during 100 days prior to admission? Yes   Current Medications    Current Facility-Administered Medications:  .  0.9 %  sodium chloride infusion, 250 mL, Intravenous, PRN, Ashok Pall, MD .  0.9 %  sodium chloride infusion, , Intravenous, Continuous, Ashok Pall, MD, Stopped at 06/30/20 2215 .  acetaminophen (TYLENOL) tablet 650 mg, 650 mg, Oral, Q6H PRN, 650 mg at 06/28/20 2117 **OR** acetaminophen (TYLENOL) suppository 650 mg, 650 mg, Rectal, Q6H PRN, Ashok Pall, MD .  aspirin EC tablet 81 mg, 81 mg, Oral, BID, Ashok Pall, MD, 81 mg at 07/08/20 0939 .  atorvastatin (LIPITOR) tablet 10 mg, 10 mg, Oral, Daily, Ashok Pall, MD, 10 mg at 07/08/20 2979 .  bethanechol (URECHOLINE) tablet 10 mg, 10 mg, Oral, TID, Ashok Pall, MD, 10 mg at 07/08/20 0937 .  Chlorhexidine Gluconate Cloth 2 % PADS 6 each, 6 each, Topical, Daily, Ashok Pall, MD, 6 each at 07/07/20 1153 .  clonazePAM (KLONOPIN) tablet 0.5 mg, 0.5 mg, Oral, QHS PRN, Ashok Pall, MD, 0.5 mg at 07/07/20 2146 .  divalproex (DEPAKOTE ER) 24 hr tablet 250 mg, 250 mg, Oral, Daily, Ashok Pall, MD, 250 mg at 07/08/20 8921 .  divalproex (DEPAKOTE SPRINKLE) capsule 750 mg, 750 mg, Oral, QHS, Cabbell, Kyle, MD, 750 mg at 07/07/20 2146 .  heparin injection 5,000 Units, 5,000 Units, Subcutaneous, Q8H, Ashok Pall, MD, 5,000 Units at 07/08/20 0535 .  HYDROcodone-acetaminophen (NORCO/VICODIN) 5-325 MG per tablet 1-2 tablet, 1-2 tablet, Oral, Q4H PRN, Ashok Pall, MD, 2 tablet at 07/08/20 [phone] .  labetalol (NORMODYNE) injection 10-40 mg, 10-40 mg, Intravenous, Q10 min PRN, Ashok Pall, MD .  levETIRAcetam (KEPPRA) tablet 500 mg, 500 mg, Oral, BID, Hammons, Kimberly B, RPH, 500 mg at 07/08/20 0938 .  losartan (COZAAR) tablet 100 mg, 100 mg, Oral, Daily, Ashok Pall, MD, 100 mg at 07/08/20 7408 .  MEDLINE mouth rinse, 15 mL, Mouth Rinse, BID, Cabbell, Kyle, MD, 15 mL at 07/08/20 0945 .  naloxone  (NARCAN) injection 0.08 mg, 0.08 mg, Intravenous, PRN, Ashok Pall, MD .  ondansetron (ZOFRAN) tablet 4 mg, 4 mg, Oral, Q6H PRN **OR** ondansetron (  ZOFRAN) injection 4 mg, 4 mg, Intravenous, Q6H PRN, Ashok Pall, MD, 4 mg at 07/08/20 0030 .  pantoprazole (PROTONIX) EC tablet 40 mg, 40 mg, Oral, QHS, Ashok Pall, MD, 40 mg at 07/07/20 2147 .  senna-docusate (Senokot-S) tablet 1 tablet, 1 tablet, Oral, BID, Ashok Pall, MD, 1 tablet at 07/07/20 1058 .  sodium chloride flush (NS) 0.9 % injection 3 mL, 3 mL, Intravenous, Q12H, Ashok Pall, MD, 3 mL at 07/08/20 0945 .  sodium chloride flush (NS) 0.9 % injection 3 mL, 3 mL, Intravenous, PRN, Ashok Pall, MD, 3 mL at 07/06/20 1002 .  topiramate (TOPAMAX) tablet 75 mg, 75 mg, Oral, QHS, Ashok Pall, MD, 75 mg at 07/07/20 2146 .  Vitamin D (Ergocalciferol) (DRISDOL) capsule 50,000 Units, 50,000 Units, Oral, Q Wed, [place], Marylyn Ishihara, MD, 50,000 Units at 07/07/20 1152 .  zolpidem (AMBIEN) tablet 5 mg, 5 mg, Oral, QHS PRN, Ashok Pall, MD, 5 mg at 07/08/20 0030   Patients Current Diet:     Diet Order                      Diet regular Room service appropriate? Yes; Fluid consistency: Thin  Diet effective now                      Precautions / Restrictions Precautions Precautions: Fall Precaution Comments: drains removed Other Brace: Cam boot L; lace up ankle brace R; has sneaker for rt foot Restrictions Weight Bearing Restrictions: Yes RLE Weight Bearing: Weight bearing as tolerated LLE Weight Bearing: Weight bearing as tolerated Other Position/Activity Restrictions: WBAT with braces per pt    Has the patient had 2 or more falls or a fall with injury in the past year?Yes  Initial fall 06/03/2020. Now has had 7 falls pta   Prior Activity Level Community (5-7x/wk): completely independent until 4 weeks pta when she had initial fall; drove   Prior Functional Level Prior Function Level of Independence: Independent Comments: Travesl  to [place] to live in summer, walks her dog and does not use assistive device at baseline, but spouse reports shuffling her feet a lot recently   Self Care: Did the patient need help bathing, dressing, using the toilet or eating?  Independent   Indoor Mobility: Did the patient need assistance with walking from room to room (with or without device)? Independent   Stairs: Did the patient need assistance with internal or external stairs (with or without device)? Independent   Functional Cognition: Did the patient need help planning regular tasks such as shopping or remembering to take medications? Independent   Home Assistive Devices / Equipment Home Assistive Devices/Equipment: Environmental consultant (specify type), Eyeglasses Home Equipment: Walker - 2 wheels, Cane - single point   Prior Device Use: Indicate devices/aids used by the patient prior to current illness, exacerbation or injury? None of the above   Current Functional Level Cognition   Arousal/Alertness: Awake/alert Overall Cognitive Status: Impaired/Different from baseline Current Attention Level: Sustained Orientation Level: Oriented X4 Following Commands: Follows one step commands consistently, Follows one step commands with increased time Safety/Judgement: Decreased awareness of safety, Decreased awareness of deficits General Comments: Patient can give detailed history of hospital events (including dates); decr awareness of ankle deficits as continues to ask to not wear boot/brace because they make it hard to balance (true however bil ankle fractures) Attention: Sustained Sustained Attention: Impaired Sustained Attention Impairment: Verbal basic, Functional basic Memory: Impaired Memory Impairment: Retrieval deficit, Decreased short term memory Decreased  Short Term Memory: Verbal basic Awareness: Impaired Awareness Impairment: Intellectual impairment, Emergent impairment Problem Solving: Impaired Problem Solving Impairment: Functional  basic Safety/Judgment: Impaired    Extremity Assessment (includes Sensation/Coordination)   Upper Extremity Assessment: Defer to OT evaluation LUE Deficits / Details: overall weaker than R; apparent motor planning/sensory motor deficits; trying to pick up cover adn she didn't have voer in hand;poor in-hand manipulation skills; clumsy hand; LUE Sensation: decreased light touch, decreased proprioception LUE Coordination: decreased fine motor (poor disdiadikokenesis)  Lower Extremity Assessment: LLE deficits/detail, RLE deficits/detail RLE Deficits / Details: AROM ankle DF about to neutral, hip flexion at least 3/5, knee extension 3+/5 LLE Deficits / Details: AROM ankle DF about -15 degrees from neurtral, hip flexion at least 3/5, knee extension 4-/5 though reports some pain with testing knee extension LLE Sensation: decreased proprioception     ADLs   Overall ADL's : Needs assistance/impaired Eating/Feeding: Minimal assistance Eating/Feeding Details (indicate cue type and reason): difficulty using L hand to assist wtih packages/ cutting Grooming: Set up, Bed level, Oral care, Wash/dry face, Wash/dry hands Upper Body Bathing: Minimal assistance, Sitting Lower Body Bathing: Moderate assistance, Bed level Upper Body Dressing : Moderate assistance, Bed level Lower Body Dressing: Total assistance, +2 for safety/equipment, +2 for physical assistance Lower Body Dressing Details (indicate cue type and reason): assist to don shoe and brace, cam boot; sit to stand max assist +2  Toilet Transfer: Total assistance, +2 for physical assistance, +2 for safety/equipment Toilet Transfer Details (indicate cue type and reason): using stedy  Functional mobility during ADLs: Maximal assistance, +2 for physical assistance, +2 for safety/equipment General ADL Comments: limited session due to pain and not wanting to attempt to get EOB due to pain     Mobility   Overal bed mobility: Needs Assistance Bed  Mobility: Rolling, Sidelying to Sit, Sit to Sidelying Rolling: Min assist (with rail rt and lt) Sidelying to sit: Mod assist, +2 for physical assistance, HOB elevated (HOB 20) Supine to sit: Mod assist, HOB elevated Sit to supine: Max assist Sit to sidelying: Mod assist, +2 for safety/equipment General bed mobility comments: Pt with HOB 20 degrees trying to eat breakfast; explained plan to get as close to EOB as possible (in sidelying)and with bed elevated, lower legs over EOB as simultaneously raising torso with intention to minimize hip/trunk flexion to minimize pain; pt agreeable, completed without reports of pain or wincing     Transfers   Overall transfer level: Needs assistance Equipment used: Rolling walker (2 wheeled) Transfer via Lift Equipment: Stedy Transfers: Sit to/from Stand Sit to Stand: Mod assist, +2 physical assistance, From elevated surface Stand pivot transfers: Total assist, +2 physical assistance General transfer comment: mod A up to stand with pt leaning posteriorly against bed, pt needing cues to extend hips; stood for 3 minutes with assisted weight-shifting left and right (and continued assist for anterior wt-shfit over BOS     Ambulation / Gait / Stairs / Wheelchair Mobility   Ambulation/Gait General Gait Details: pre-gait only; unable to advance to gait due to posterior lean     Posture / Balance Dynamic Sitting Balance Sitting balance - Comments: UE support needed for balance; no outside assist to maintain balance Balance Overall balance assessment: Needs assistance Sitting-balance support: Feet supported Sitting balance-Leahy Scale: Poor Sitting balance - Comments: UE support needed for balance; no outside assist to maintain balance Postural control: Right lateral lean Standing balance support: Bilateral upper extremity supported Standing balance-Leahy Scale: Poor Standing balance comment: up to  mod assist to stand due to posterior lean     Special  needs/care consideration Designated visitor is spouse, Dellis Filbert    Previous Home Environment  Living Arrangements: Spouse/significant other  Lives With: Spouse Available Help at Discharge: Family, Available 24 hours/day Type of Home: House Home Layout: Two level, Bed/bath upstairs, 1/2 bath on main level Alternate Level Stairs-Rails: Right, Left Alternate Level Stairs-Number of Steps: flight Home Access: Stairs to enter Entrance Stairs-Rails: Left Entrance Stairs-Number of Steps: 5 Bathroom Shower/Tub: Chiropodist: Handicapped height Bathroom Accessibility: Yes How Accessible: Accessible via walker [place]: No   Discharge Living Setting Plans for Discharge Living Setting: Patient's home, Lives with (comment) (spouse) Type of Home at Discharge: House Discharge Home Layout: Two level, Able to live on main level with bedroom/bathroom, 1/2 bath on main level Alternate Level Stairs-Rails: Right, Left Alternate Level Stairs-Number of Steps: flight Discharge Home Access: Stairs to enter Entrance Stairs-Rails: Left Entrance Stairs-Number of Steps: 5 Discharge Bathroom Shower/Tub: Tub/shower unit Discharge Bathroom Toilet: Handicapped height Discharge Bathroom Accessibility: Yes How Accessible: Accessible via walker Does the patient have any problems obtaining your medications?: No   Social/Family/Support Systems Patient Roles: Spouse, Parent Contact Information: spouse, Dellis Filbert Anticipated Caregiver: spouse Anticipated Ambulance person Information: see above Ability/Limitations of Caregiver: no limitations Caregiver Availability: 24/7 Discharge Plan Discussed with Primary Caregiver: Yes Is Caregiver In Agreement with Plan?: Yes Does Caregiver/Family have Issues with Lodging/Transportation while Pt is in Rehab?: No   Goals Patient/Family Goal for Rehab: min asisst with PT and OT Expected length of stay: ELOS 17 to 21 days Pt/Family Agrees to  Admission and willing to participate: Yes Program Orientation Provided & Reviewed with Pt/Caregiver Including Roles  & Responsibilities: Yes   Decrease burden of Care through IP rehab admission: n/a   Possible need for SNF placement upon discharge:not anticipated   Patient Condition: This patient's medical and functional status has changed since the consult dated: 07/02/2020 in which the Rehabilitation Physician determined and documented that the patient's condition is appropriate for intensive rehabilitative care in an inpatient rehabilitation facility. See "History of Present Illness" (above) for medical update. Functional changes are: max assist. Patient's medical and functional status update has been discussed with the Rehabilitation physician and patient remains appropriate for inpatient rehabilitation. Will admit to inpatient rehab today.   Preadmission Screen Completed By:  Cleatrice Burke, RN, 07/08/2020 12:33 PM ______________________________________________________________________   Discussed status with Dr. Dagoberto Ligas on 07/08/2020 at 1234 and received approval for admission today.   Admission Coordinator:  Cleatrice Burke, time 0174 Date 07/08/2020             Cosigned by: Courtney Heys, MD at 07/08/2020 12:36 PM  Revision History                          Note Details  Author Amanda Gong, RN File Time 07/08/2020 12:34 PM  Author Type Rehab Admission Coordinator Status Signed  Last Editor Amanda Gong, RN Service Physical Medicine and [place] # [phone] Admit Date 06/23/2020

## 2020-07-09 ENCOUNTER — Inpatient Hospital Stay (HOSPITAL_COMMUNITY): Payer: Medicare Other

## 2020-07-09 ENCOUNTER — Inpatient Hospital Stay (HOSPITAL_COMMUNITY): Payer: Medicare Other | Admitting: Speech Pathology

## 2020-07-09 DIAGNOSIS — M7989 Other specified soft tissue disorders: Secondary | ICD-10-CM

## 2020-07-09 LAB — COMPREHENSIVE METABOLIC PANEL
ALT: 16 U/L (ref 0–44)
AST: 14 U/L — ABNORMAL LOW (ref 15–41)
Albumin: 2.9 g/dL — ABNORMAL LOW (ref 3.5–5.0)
Alkaline Phosphatase: 77 U/L (ref 38–126)
Anion gap: 10 (ref 5–15)
BUN: 15 mg/dL (ref 8–23)
CO2: 22 mmol/L (ref 22–32)
Calcium: 9.1 mg/dL (ref 8.9–10.3)
Chloride: 94 mmol/L — ABNORMAL LOW (ref 98–111)
Creatinine, Ser: 0.75 mg/dL (ref 0.44–1.00)
GFR calc Af Amer: >60 mL/min (ref >60)
GFR calc non Af Amer: >60 mL/min (ref >60)
Glucose, Bld: 132 mg/dL — ABNORMAL HIGH (ref 70–99)
Potassium: 3.8 mmol/L (ref 3.5–5.1)
Sodium: 126 mmol/L — ABNORMAL LOW (ref 135–145)
Total Bilirubin: 0.6 mg/dL (ref 0.3–1.2)
Total Protein: 6.1 g/dL — ABNORMAL LOW (ref 6.5–8.1)

## 2020-07-09 LAB — CBC WITH DIFFERENTIAL/PLATELET
Abs Immature Granulocytes: 0.13 10*3/uL — ABNORMAL HIGH (ref 0.00–0.07)
Basophils Absolute: 0 10*3/uL (ref 0.0–0.1)
Basophils Relative: 1 %
Eosinophils Absolute: 0.3 10*3/uL (ref 0.0–0.5)
Eosinophils Relative: 4 %
HCT: 33.4 % — ABNORMAL LOW (ref 36.0–46.0)
Hemoglobin: 11.2 g/dL — ABNORMAL LOW (ref 12.0–15.0)
Immature Granulocytes: 2 %
Lymphocytes Relative: 20 %
Lymphs Abs: 1.3 10*3/uL (ref 0.7–4.0)
MCH: 32 pg (ref 26.0–34.0)
MCHC: 33.5 g/dL (ref 30.0–36.0)
MCV: 95.4 fL (ref 80.0–100.0)
Monocytes Absolute: 0.8 10*3/uL (ref 0.1–1.0)
Monocytes Relative: 12 %
Neutro Abs: 4.3 10*3/uL (ref 1.7–7.7)
Neutrophils Relative %: 61 %
Platelets: 448 10*3/uL — ABNORMAL HIGH (ref 150–400)
RBC: 3.5 MIL/uL — ABNORMAL LOW (ref 3.87–5.11)
RDW: 11.9 % (ref 11.5–15.5)
WBC: 6.9 10*3/uL (ref 4.0–10.5)
nRBC: 0 % (ref 0.0–0.2)

## 2020-07-09 MED ORDER — PNEUMOCOCCAL VAC POLYVALENT 25 MCG/0.5ML IJ INJ: 0.5000 mL | INJECTION | INTRAMUSCULAR | Status: DC

## 2020-07-09 MED ORDER — METFORMIN HCL 500 MG PO TABS
500.0000 mg | ORAL_TABLET | Freq: Every day | ORAL | Status: DC
  Administered 2020-07-09 – 2020-08-04 (×27): 500 mg via ORAL
  Filled 2020-07-09 (×28): qty 1

## 2020-07-09 MED ORDER — HYDROCODONE-ACETAMINOPHEN 5-325 MG PO TABS
2.0000 | ORAL_TABLET | Freq: Four times a day (QID) | ORAL | Status: DC
  Administered 2020-07-09 – 2020-07-29 (×72): 2 via ORAL
  Filled 2020-07-09 (×77): qty 2

## 2020-07-09 NOTE — Evaluation (Signed)
Speech Language Pathology Assessment and Plan  Patient Details  Name: Amanda Collier MRN: [id number] Date of Birth: 1946-02-28  SLP Diagnosis: Dysarthria;Cognitive Impairments  Rehab Potential: Good ELOS: 3-4 weeks    Today's Date: 07/09/2020 SLP Individual Time: 5329-9242 SLP Individual Time Calculation (min): 55 min   Hospital Problem: Principal Problem:   Traumatic subdural hematoma ([place])  Past Medical History:  Past Medical History:  Diagnosis Date  . Headache syndrome 12/18/2016  . High blood pressure   . Hypertension    followed by Dr Gillian Shields medications  . Leg swelling    Hx of   Past Surgical History:  Past Surgical History:  Procedure Laterality Date  . CRANIOTOMY Bilateral 06/24/2020   Procedure: Bilateral craniotomy for evacuation of subdural hematoma;  Surgeon: Ashok Pall, MD;  Location: [facility];  Service: Neurosurgery;  Laterality: Bilateral;  . CRANIOTOMY Right 06/26/2020   Procedure: CRANIOTOMY HEMATOMA EVACUATION SUBDURAL;  Surgeon: Ashok Pall, MD;  Location: [facility];  Service: Neurosurgery;  Laterality: Right;  . NO PAST SURGERIES      Assessment / Plan / Recommendation Clinical Impression Patient is a 74 year old right-handed female with history of hypertension, diabetes mellitus and alcohol use as well as left nondisplaced fracture of left lateral malleolus and nondisplaced fracture of lateral malleolus right fibula sprain of tibiofibular ligament right ankle after recent fall 06/14/2020 followed by Dr. Louanne Skye.  Per chart review lives with spouse.  Two-level home bed and bath upstairs 5 steps to entry.  Reportedly independent prior to admission.  Presented 06/26/2020 with multiple falls and gait difficulties.  Recent MRI and imaging revealed large subacute subdural hematoma bilaterally with significant mass-effect on both cerebral hemispheres without midline shift.  Negative for acute infarct. CT cervical thoracic spine with no fracture.  Patient  underwent craniotomy hematoma evacuation of subdural hematoma 06/24/2020 per Dr. Christella Noa.  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  MRI of lumbar spine completed due to low back/sacral pain and lower extremity weakness 07/04/2020 showed subdural hemorrhage extending through the length of the visualized spine mainly dorsal and most notable in the lower thoracic segments and at the L5-S1 level.  The distal cord mildly deflected by hemorrhage but no cord signal abnormality identified.  Moderate flattening of the thecal sac at L5-S1 due to a disc bulge and subdural hemorrhage reviewed by neurology services advised conservative care consideration for trial of Neurontin.  Maintained on Depakote/Keppra for seizure prophylaxis.  Patient was cleared to begin subcutaneous heparin for DVT prophylaxis as well as resuming aspirin as prior to admission.  Bouts of urinary retention maintained on Urecholine.  Tolerating a regular consistency diet.  Patient is weightbearing as tolerated lower extremities Cam boot on left and lace up ankle brace on right.  Therapy evaluations completed and patient was admitted for a comprehensive rehab program 07/08/20.  Patient demonstrates behaviors consistent with a Rancho Level VII and demonstrates moderate cognitive deficits in the areas of sustained attention, functional problem solving and recall. Patient's language appeared [name] for all tasks assessed but a mild dysarthria was noted due to imprecise consonants which impacted her overall intelligibility at the conversation level. Patient would benefit from skilled SLP intervention to maximize her cognitive functioning and overall functional independence prior to discharge.    Skilled Therapeutic Interventions          Administered a cognitive-linguistic evaluation, please see above for details. Patient administered the Cognistat and  demonstrates mild  deficits in orientation and severe deficits in Ecologist. Educated patient on results, she verbalized understanding and agreement.    SLP Assessment  Patient will need skilled [place] during CIR admission    Recommendations  Recommendations for Other Services: Neuropsych consult Patient destination: Home Follow up Recommendations: Home Health SLP;Outpatient SLP;24 hour supervision/assistance Equipment Recommended: None recommended by SLP    SLP Frequency 3 to 5 out of 7 days   SLP Duration  SLP Intensity  SLP Treatment/Interventions 3-4 weeks  Minumum of 1-2 x/day, 30 to 90 minutes  Cognitive remediation/compensation;Speech/Language facilitation;Therapeutic Activities;Environmental controls;Cueing hierarchy;Functional tasks;Patient/family education;Internal/external aids    Pain No/Denies Pain   Prior Functioning Type of Home: House  Lives With: Spouse Available Help at Discharge: Family;Available 24 hours/day Vocation: Retired  Programmer, systems Overall Cognitive Status: Impaired/Different from baseline Arousal/Alertness: Awake/alert Orientation Level: Oriented to person;Oriented to place;Oriented to situation;Disoriented to time Attention: Sustained Sustained Attention: Impaired Sustained Attention Impairment: Verbal basic;Functional basic Memory: Impaired Memory Impairment: Retrieval deficit;Decreased short term memory;Decreased recall of new information Decreased Short Term Memory: Verbal basic;Functional basic Immediate Memory Recall: Sock;Blue;Bed Memory Recall Sock: Not able to recall Memory Recall Blue: Without Cue Memory Recall Bed: Without Cue Awareness: Impaired Awareness Impairment: Intellectual impairment Problem Solving: Impaired Problem Solving Impairment: Functional basic Safety/Judgment: Impaired [place] [employer] Scales of Cognitive Functioning: Automatic/appropriate   Comprehension Auditory Comprehension Overall Auditory Comprehension: Appears within functional limits for tasks assessed Expression Expression Primary Mode of Expression: Verbal Verbal Expression Overall Verbal Expression: Appears within functional limits for tasks assessed Written Expression Dominant Hand: Right Written Expression: Not tested Oral Motor Oral Motor/Sensory Function Overall Oral Motor/Sensory Function: Mild impairment Facial ROM: Reduced left;Suspected CN VII (facial) dysfunction Facial Symmetry: Abnormal symmetry left;Suspected CN VII (facial) dysfunction Facial Strength: Reduced left;Suspected CN VII (facial) dysfunction Lingual ROM: Within Functional Limits Lingual Symmetry: Within Functional Limits Lingual Strength: Within Functional Limits Velum: Within Functional Limits Mandible: Within Functional Limits Motor Speech Overall Motor Speech: Impaired Respiration: Within functional limits Phonation: Normal Resonance: Within functional limits Articulation: Impaired Level of Impairment: Conversation Intelligibility: Intelligibility reduced Word: 75-100% accurate Phrase: 75-100% accurate Conversation: 75-100% accurate Motor Planning: Witnin functional limits Effective Techniques: Increased vocal intensity;Over-articulate;Slow rate  Care Tool Care Tool Cognition Expression of Ideas and Wants Expression of Ideas and Wants: Some difficulty - exhibits some difficulty with expressing needs and ideas (e.g, some words or finishing thoughts) or speech is not clear   Understanding Verbal and Non-Verbal Content Understanding Verbal and Non-Verbal Content: Usually understands - understands most conversations, but misses some part/intent of message. Requires cues at times to understand   Memory/Recall Ability *first 3 days only Memory/Recall Ability *first 3 days only: Current season;That he or she is in a hospital/hospital unit     Short Term Goals: Week 1: SLP  Short Term Goal 1 (Week 1): Patient will utilize speech intelligibility strategies at the conversation levle with Min A verbal cues. SLP Short Term Goal 2 (Week 1): Patient will demonstrate functional problem solving for mildly complex tasks with Mod A verbal cues. SLP Short Term Goal 3 (Week 1): Patient will recall new, daily information with Mod A verbal and visual cues. SLP Short Term Goal 4 (Week 1): Patient will demonstrate sustained attention to functional tasks for 30 minutes with Mod verbal cues for redirection.  Refer to Care Plan for Long Term Goals  Recommendations for other services: Neuropsych  Discharge Criteria: Patient will be discharged from SLP if patient refuses treatment 3 consecutive  times without medical reason, if treatment goals not met, if there is a change in medical status, if patient makes no progress towards goals or if patient is discharged from hospital.  The above assessment, treatment plan, treatment alternatives and goals were discussed and mutually agreed upon: by patient  Liboria Putnam 07/09/2020, 12:49 PM

## 2020-07-09 NOTE — Evaluation (Signed)
Physical Therapy Assessment and Plan  Patient Details  Name: Amanda Collier MRN: [id number] Date of Birth: 1946-04-09  PT Diagnosis: Abnormal posture, Abnormality of gait, Cognitive deficits, Coordination disorder, Difficulty walking, Impaired cognition, Muscle weakness and Pain in BLE's Rehab Potential: Good ELOS: 3-4 weeks   Today's Date: 07/09/2020 PT Individual Time: 1300-1405 PT Individual Time Calculation (min): 65 min    Hospital Problem: Principal Problem:   Traumatic subdural hematoma ([place])   Past Medical History:  Past Medical History:  Diagnosis Date  . Headache syndrome 12/18/2016  . High blood pressure   . Hypertension    followed by Dr Gillian Shields medications  . Leg swelling    Hx of   Past Surgical History:  Past Surgical History:  Procedure Laterality Date  . CRANIOTOMY Bilateral 06/24/2020   Procedure: Bilateral craniotomy for evacuation of subdural hematoma;  Surgeon: Ashok Pall, MD;  Location: [place];  Service: Neurosurgery;  Laterality: Bilateral;  . CRANIOTOMY Right 06/26/2020   Procedure: CRANIOTOMY HEMATOMA EVACUATION SUBDURAL;  Surgeon: Ashok Pall, MD;  Location: [facility];  Service: Neurosurgery;  Laterality: Right;  . NO PAST SURGERIES      Assessment & Plan Clinical Impression: Patient is a 74 year old right-handed female with history of hypertension, diabetes mellitus and alcohol use as well as left nondisplaced fracture of left lateral malleolus and nondisplaced fracture of lateral malleolus right fibula sprain of tibiofibular ligament right ankle after recent fall 06/14/2020 followed by Dr. Louanne Skye.  Per chart review lives with spouse.  Two-level home bed and bath upstairs 5 steps to entry.  Reportedly independent prior to admission.  Presented 06/26/2020 with multiple falls and gait difficulties.  Recent MRI and imaging revealed large subacute subdural hematoma bilaterally with significant mass-effect on both cerebral hemispheres without midline  shift.  Negative for acute infarct.  CT cervical thoracic spine with no fracture.  Patient underwent craniotomy hematoma evacuation of subdural hematoma 06/24/2020 per Dr. Christella Noa.  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  MRI of lumbar spine completed due to low back/sacral pain and lower extremity weakness 07/04/2020 showed subdural hemorrhage extending through the length of the visualized spine mainly dorsal and most notable in the lower thoracic segments and at the L5-S1 level.  The distal cord mildly deflected by hemorrhage but no cord signal abnormality identified.  Moderate flattening of the thecal sac at L5-S1 due to a disc bulge and subdural hemorrhage reviewed by neurology services advised conservative care consideration for trial of Neurontin.  Maintained on Depakote/Keppra for seizure prophylaxis.  Patient was cleared to begin subcutaneous heparin for DVT prophylaxis as well as resuming aspirin as prior to admission.  Bouts of urinary retention maintained on Urecholine.  Tolerating a regular consistency diet.  Patient is weightbearing as tolerated lower extremities Cam boot on left and lace up ankle brace on right.  Therapy evaluations completed and patient was admitted for a comprehensive rehab program.  Patient currently requires max with mobility secondary to muscle weakness, impaired timing and sequencing, unbalanced muscle activation, motor apraxia, decreased coordination and decreased motor planning, decreased midline orientation, decreased motor planning and ideational apraxia and decreased initiation, decreased attention, decreased awareness, decreased problem solving, decreased safety awareness, decreased memory and delayed processing.  Prior to hospitalization, patient was independent  with mobility and lived with Spouse in a House home.  Home access is 4-5Stairs to enter.  Patient will  benefit from skilled PT intervention to maximize safe  functional mobility, minimize fall risk and decrease caregiver burden for planned discharge home with 24 hour supervision/assist.  Anticipate patient will benefit from follow up HHPT vs OPPT at discharge pending progress.  PT - End of Session Activity Tolerance: Tolerates 30+ min activity with multiple rests Endurance Deficit: Yes PT Assessment Rehab Potential (ACUTE/IP ONLY): Good PT Barriers to Discharge: [place] home environment;Decreased caregiver support;Home environment access/layout;Lack of/limited family support;Weight bearing restrictions PT Barriers to Discharge Comments: bed/bath on 2nd lvl with full flight to access. 4-5 STE home PT Patient demonstrates impairments in the following area(s): Balance;Behavior;Endurance;Motor;Nutrition;Safety;Sensory;Perception PT Transfers Functional Problem(s): Bed Mobility;Bed to Chair;Car PT Locomotion Functional Problem(s): Ambulation;Stairs PT Plan PT Intensity: Minimum of 1-2 x/day ,45 to 90 minutes PT Frequency: 5 out of 7 days PT Duration Estimated Length of Stay: 3-4 weeks PT Treatment/Interventions: Ambulation/gait training;Discharge planning;Functional mobility training;Psychosocial support;Therapeutic Activities;Visual/perceptual remediation/compensation;Wheelchair propulsion/positioning;Neuromuscular re-education;Skin care/wound management;Therapeutic Exercise;Disease management/prevention;Balance/vestibular training;Cognitive remediation/compensation;DME/adaptive equipment instruction;Pain management;Splinting/orthotics;UE/LE Strength taining/ROM;UE/LE Coordination activities;Stair training;Patient/family education;Functional electrical stimulation;Community reintegration PT Transfers Anticipated Outcome(s): CGA PT Locomotion Anticipated Outcome(s): CGA PT Recommendation Follow Up Recommendations: 24 hour supervision/assistance;Home health PT;Outpatient PT (OPPT vs HHPT pending  progress) Equipment Recommended: To be determined Equipment Details: Husband repors they have a RW   PT Evaluation Precautions/Restrictions Precautions Precautions: Fall Required Braces or Orthoses: Other Brace Other Brace: L cam boot; R lace up ankle brace; has sneaker for R foot Restrictions Weight Bearing Restrictions: Yes RLE Weight Bearing: Weight bearing as tolerated LLE Weight Bearing: Weight bearing as tolerated Other Position/Activity Restrictions: WBAT with braces General Chart Reviewed: Yes Family/Caregiver Present: Yes Vital SignsTherapy Vitals Pulse Rate: 74 Resp: 18 BP: 99/63 Patient Position (if appropriate): Sitting Oxygen Therapy SpO2: 98 % O2 Device: Room Air Pain Pain Assessment Pain Scale: 0-10 Faces Pain Scale: No hurt Home Living/Prior Functioning Home Living Available Help at Discharge: Family;Available 24 hours/day Type of Home: House Home Access: Stairs to enter [employer] of Steps: 4-5 Entrance Stairs-Rails: Left Home Layout: Two level;Bed/bath upstairs;1/2 bath on main level Alternate Level Stairs-Number of Steps: flight Alternate Level Stairs-Rails: Right;Left Bathroom Shower/Tub: Tub/shower unit  Lives With: Spouse Prior Function Level of Independence: Independent with basic ADLs;Independent with gait;Independent with transfers  Able to Take Stairs?: Yes Driving: Yes Vocation: Retired 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, walks her dog and does not use assistive device at baseline, but spouse reports shuffling her feet a lot recently Vision/Perception  Vision - Assessment Eye Alignment: Within Functional Limits Ocular Range of Motion: Within Functional Limits Tracking/Visual Pursuits: Able to track stimulus in all quads without difficulty Perception Perception: Impaired (mild L inattention) Praxis Praxis: Impaired Praxis Impairment Details: Ideomotor;Motor planning;Initiation  Cognition Overall Cognitive  Status: Impaired/Different from baseline Arousal/Alertness: Awake/alert Orientation Level: Oriented to person;Oriented to place;Oriented to time;Oriented to situation;Oriented X4 Attention: Sustained Sustained Attention: Impaired Sustained Attention Impairment: Verbal basic;Functional basic Memory: Impaired Memory Impairment: Retrieval deficit;Decreased short term memory;Decreased recall of new information Decreased Short Term Memory: Verbal basic;Functional basic Awareness: Impaired Awareness Impairment: Intellectual impairment Problem Solving: Impaired Problem Solving Impairment: Functional basic Behaviors: Other (comment) (tangential  in speech) Safety/Judgment: Impaired [employer] Scales of Cognitive Functioning: Automatic/appropriate Sensation Sensation Light Touch: Impaired by gross assessment Proprioception: Impaired by gross assessment Coordination Gross Motor Movements are Fluid and Coordinated: No Fine Motor Movements are Fluid and Coordinated: No Motor  Motor Motor: Abnormal postural alignment and control;Motor apraxia Motor - Skilled Clinical Observations: delayed, efforful.   Trunk/Postural Assessment  Cervical Assessment Cervical Assessment: Exceptions to [facility] (forward head)  Thoracic Assessment Thoracic Assessment: Exceptions to [facility] (rounded shlders) Lumbar Assessment Lumbar Assessment: Exceptions to [facility] (sacral sitting, post pelvic tilt) Postural Control Postural Control: Deficits on evaluation (delayed)  Balance Balance Balance Assessed: Yes Static Sitting Balance Static Sitting - Balance Support: Feet supported;Right upper extremity supported Static Sitting - Level of Assistance: 4: Min assist Dynamic Sitting Balance Dynamic Sitting - Balance Support: No upper extremity supported Dynamic Sitting - Level of Assistance: 4: Min assist Static Standing Balance Static Standing - Level of Assistance: 2: Max assist Static Standing - Comment/# of Minutes:  face-to-face with no AD and B knee block Extremity Assessment  RUE Assessment RUE Assessment: Exceptions to [facility] General Strength Comments: Grossly 4/5, generalized weakness LUE Assessment LUE Assessment: Exceptions to [facility] General Strength Comments: Grossly 4/5, generalized weakness with decreased coordination/inattention RLE Assessment RLE Assessment: Exceptions to [facility] General Strength Comments: ankle 4/5, knee 4/5, hip 4/5 LLE Assessment LLE Assessment: Exceptions to [facility] General Strength Comments: ankle 4-/5, knee 4-/5, hip 3+/5  Care Tool Care Tool Bed Mobility Roll left and right activity   Roll left and right assist level: Moderate Assistance - Patient 50 - 74%    Sit to lying activity   Sit to lying assist level: Maximal Assistance - Patient 25 - 49%    Lying to sitting edge of bed activity   Lying to sitting edge of bed assist level: Maximal Assistance - Patient 25 - 49%     Care Tool Transfers Sit to stand transfer   Sit to stand assist level: Maximal Assistance - Patient 25 - 49%    Chair/bed transfer   Chair/bed transfer assist level: Maximal Assistance - Patient 25 - 49% (squat-pivot)     Toilet transfer   Assist Level: Dependent - Patient 0% (stedy)    Car transfer Car transfer activity did not occur: Safety/medical concerns (symptomatic hypotension)        Care Tool Locomotion Ambulation Ambulation activity did not occur: Safety/medical concerns (symptomatic hypotension)        Walk 10 feet activity Walk 10 feet activity did not occur: Safety/medical concerns       Walk 50 feet with 2 turns activity Walk 50 feet with 2 turns activity did not occur: Safety/medical concerns      Walk 150 feet activity Walk 150 feet activity did not occur: Safety/medical concerns      Walk 10 feet on uneven surfaces activity Walk 10 feet on uneven surfaces activity did not occur: Safety/medical concerns      Stairs Stair activity did not occur: Safety/medical  concerns        Walk up/down 1 step activity Walk up/down 1 step or curb (drop down) activity did not occur: Safety/medical concerns     Walk up/down 4 steps activity did not occuR: Safety/medical concerns  Walk up/down 4 steps activity      Walk up/down 12 steps activity Walk up/down 12 steps activity did not occur: Safety/medical concerns      Pick up small objects from floor Pick up small object from the floor (from standing position) activity did not occur: Safety/medical concerns      Wheelchair Will patient use wheelchair at discharge?: No   Wheelchair activity did not occur: Safety/medical concerns      Wheel 50 feet with 2 turns activity Wheelchair 50 feet with 2 turns activity did not occur: Safety/medical concerns    Wheel 150 feet activity Wheelchair 150 feet activity did not occur: Safety/medical concerns  Refer to Care Plan for Long Term Goals  SHORT TERM GOAL WEEK 1 PT Short Term Goal 1 (Week 1): Pt will perform bed mobility with modA PT Short Term Goal 2 (Week 1): Pt will maintain sitting EOB >5 minutes with CGA PT Short Term Goal 3 (Week 1): Pt will perform bed<>chair transfer with modA and LRAD PT Short Term Goal 4 (Week 1): Pt will initiate pre-gait and standing activities with modA and LRAD  Recommendations for other services: None   Skilled Therapeutic Intervention Mobility Bed Mobility Bed Mobility: Rolling Right;Rolling Left;Sit to Supine;Supine to Sit Rolling Right: Moderate Assistance - Patient 50-74% Rolling Left: Moderate Assistance - Patient 50-74% Supine to Sit: Maximal Assistance - Patient - Patient 25-49% Sit to Supine: Maximal Assistance - Patient 25-49% Transfers Transfers: Sit to Stand;Stand to Sit;Squat Pivot Transfers Sit to Stand: Maximal Assistance - Patient 25-49% (B knee block, from w/c level height. Face-to-face technique, no AD) Stand to Sit: Maximal Assistance - Patient 25-49% Stand Pivot Transfers: Dependent - mechanical  lift Squat Pivot Transfers: Maximal Assistance - Patient 25-49% (EOB<>W/c) Transfer (Assistive device): None Transfer via Lift Equipment: Probation officer Ambulation: No Gait Gait: No Stairs / Additional Locomotion Stairs: No Wheelchair Mobility Wheelchair Mobility: No  Skilled Intervention: Pt received supine in bed, husband at bedside, pt agreeable to PT session. Pt and husband educated on role of PT, CIR, ELOS, precautions, conferences, and PT POC. CAM boot and ankle brace donned with totalA while pt supine in bed. Pt performed supine<>sit with maxA with HOB flat, assist for trunk and BLE management. Initially requiring min/modA for sitting balance, progressing to CGA. Pt with poor motor planning, following sequential steps, and is often tangential in speech requiring frequent redirection. Squat-pivot transfer with mod/maxA from EOB to w/c. Transported patient with totalA in w/c for time management to main therapy gym. On arrival in gym, pt complaining of worsening dizziness, rates 7/10. Pt with slightly decreased palor and minor word slurring. Returned patient to her room to assess vitals, NT present, BP reading seated in w/c 99/63 (74) HR 80, spO2 98% on RA. Sit<>stand from w/c level with maxA and B knee block via face-to-face technique, maintained standing for ~20 seconds before pt reports worsening dizziness. Deferred further functional mobility 2/2 symptomatic hypotension. Squat pivot with maxA back to bed, sit>supine with maxA. BP assessed supine, reading 94/61 (72), HR 72. Pt drowsy, RN Blaire notified of pt's response to mobility and BP concerns. Bed lowered, rails up, bed alarm on, needs in reach at end of session  Instructed pt in results of PT evaluation as detailed above, PT POC, rehab potential, rehab goals, and discharge recommendations. Additionally discussed CIR's policies regarding fall safety and use of chair alarm and/or quick release belt. Pt verbalized understanding and  in agreement. Will update pt's family members as they become available.   Discharge Criteria: Patient will be discharged from PT if patient refuses treatment 3 consecutive times without medical reason, if treatment goals not met, if there is a change in medical status, if patient makes no progress towards goals or if patient is discharged from hospital.  The above assessment, treatment plan, treatment alternatives and goals were discussed and mutually agreed upon: by patient and by family  Jone Baseman [facility] PT 07/09/2020, 3:35 PM

## 2020-07-09 NOTE — Evaluation (Signed)
Occupational Therapy Assessment and Plan  Patient Details  Name: Amanda Collier MRN: [id number] Date of Birth: 08-09-1946  OT Diagnosis: abnormal posture, acute pain, cognitive deficits, disturbance of vision, muscle weakness (generalized) and pain in joint Rehab Potential: Rehab Potential (ACUTE ONLY): Good ELOS: 3-4 weeks   Today's Date: 07/09/2020 OT Individual Time: 1000-1100 OT Individual Time Calculation (min): 60 min     Hospital Problem: Principal Problem:   Traumatic subdural hematoma ([place])   Past Medical History:  Past Medical History:  Diagnosis Date  . Headache syndrome 12/18/2016  . High blood pressure   . Hypertension    followed by Dr Gillian Shields medications  . Leg swelling    Hx of   Past Surgical History:  Past Surgical History:  Procedure Laterality Date  . CRANIOTOMY Bilateral 06/24/2020   Procedure: Bilateral craniotomy for evacuation of subdural hematoma;  Surgeon: Ashok Pall, MD;  Location: [facility];  Service: Neurosurgery;  Laterality: Bilateral;  . CRANIOTOMY Right 06/26/2020   Procedure: CRANIOTOMY HEMATOMA EVACUATION SUBDURAL;  Surgeon: Ashok Pall, MD;  Location: [facility];  Service: Neurosurgery;  Laterality: Right;  . NO PAST SURGERIES      Assessment & Plan Clinical Impression: .74 yo with hx of DM, balance disorder, HTN and B subdural hematomas. Pt has fallen 7 tinmes in last 3 weeks. Underwent evacuation of B hematomas 8/12 and again on 8/14. Pt with B ventriculostomy drains. On 8/2 pt had xrays of B ankles due to pain and pt with L nondisplaced fx of lateral malleolus and nondisplaced fx of lateral malleolus R fibulu; sprian of tibiofibular ligament R ankle. Patient developed severe low back pain; 07/06/2020 lumbar spine MRI showed SDH extending mainly dorsal in the lower thoracic segments and at the L5-S1 region; distal cord was mildly displaced but no abnormal cord signal was identified. There was also moderate flattening of the thecal sac  at L5-S1 due to disc bulge. All new changes since 8/11 lumbar MRI. No surgery planned     Patient currently requires max with basic self-care skills secondary to muscle weakness, decreased cardiorespiratoy endurance, unbalanced muscle activation, decreased coordination and decreased motor planning, decreased visual perceptual skills, decreased attention to left and decreased motor planning, decreased initiation, decreased attention, decreased awareness, decreased problem solving, decreased safety awareness, decreased memory and delayed processing and decreased sitting balance, decreased standing balance, decreased postural control, decreased balance strategies and difficulty maintaining precautions.  Prior to hospitalization, patient could complete BADL with independent .  Patient will benefit from skilled intervention to decrease level of assist with basic self-care skills and increase independence with basic self-care skills prior to discharge home with care partner.  Anticipate patient will require 24 hour supervision and follow up outpatient.  OT - End of Session Activity Tolerance: Tolerates 30+ min activity with multiple rests Endurance Deficit: Yes OT Assessment Rehab Potential (ACUTE ONLY): Good OT Barriers to Discharge: Inaccessible home environment;Weight bearing restrictions OT Patient demonstrates impairments in the following area(s): Balance;Behavior;Cognition;Endurance;Motor;Pain;Perception;Safety;Sensory;Skin Integrity;Vision OT Basic ADL's Functional Problem(s): Grooming;Bathing;Dressing;Toileting OT Transfers Functional Problem(s): Toilet;Tub/Shower OT Additional Impairment(s): Fuctional Use of Upper Extremity OT Plan OT Intensity: Minimum of 1-2 x/day, 45 to 90 minutes OT Frequency: 5 out of 7 days OT Duration/Estimated Length of Stay: 3-4 weeks OT Treatment/Interventions: Balance/vestibular training;Discharge planning;Pain management;Self Care/advanced ADL  retraining;Therapeutic Activities;UE/LE Coordination activities;Visual/perceptual remediation/compensation;Therapeutic Exercise;Skin care/wound managment;Patient/family education;Functional mobility training;Cognitive remediation/compensation;Disease mangement/prevention;Community reintegration;DME/adaptive equipment instruction;Psychosocial support;Neuromuscular re-education;Splinting/orthotics;UE/LE Strength taining/ROM;Wheelchair propulsion/positioning OT Self Feeding Anticipated Outcome(s): S OT Basic Self-Care Anticipated Outcome(s): S OT  Toileting Anticipated Outcome(s): S OT Bathroom Transfers Anticipated Outcome(s): S OT Recommendation Patient destination: Home Follow Up Recommendations: Home health OT Equipment Recommended: To be determined;Tub/shower seat;3 in 1 bedside comode   OT Evaluation Precautions/Restrictions  Precautions Precautions: Fall Precaution Comments: drains removed Other Brace: Cam boot L; lace up ankle brace R; has sneaker for rt foot Restrictions Weight Bearing Restrictions: Yes RLE Weight Bearing: Weight bearing as tolerated LLE Weight Bearing: Weight bearing as tolerated Other Position/Activity Restrictions: WBAT with braces per pt General Chart Reviewed: Yes Vital Signs   Pain   Home Living/Prior Functioning Home Living Family/patient expects to be discharged to:: Private residence Living Arrangements: Spouse/significant other Available Help at Discharge: Family, Available 24 hours/day Type of Home: House Home Access: Stairs to enter [employer] of Steps: 5 Entrance Stairs-Rails: Left Home Layout: Two level, Bed/bath upstairs, 1/2 bath on main level Alternate Level Stairs-Number of Steps: flight Alternate Level Stairs-Rails: Right, Left Bathroom Shower/Tub: Chiropodist: Handicapped height Bathroom Accessibility: Yes  Lives With: Spouse Prior Function 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, walks her dog and does not use assistive device at baseline, but spouse reports shuffling her feet a lot recently Vision Baseline Vision/History: Wears glasses Wears Glasses: At all times Vision Assessment?: Vision impaired- to be further tested in functional context Perception  Perception: Impaired (L inattention) Praxis Praxis: Impaired Praxis Impairment Details: Ideomotor;Motor planning;Initiation Cognition Overall Cognitive Status: Impaired/Different from baseline Arousal/Alertness: Awake/alert Orientation Level: Person;Place;Situation Person: Oriented Place: Oriented Situation: Oriented Year: 2021 Month: August Day of Week: Correct Memory: Impaired Immediate Memory Recall: Sock;Blue;Bed Memory Recall Sock: Not able to recall Memory Recall Blue: Without Cue Memory Recall Bed: Without Cue Sustained Attention: Impaired Awareness: Impaired Safety/Judgment: Impaired Sensation Sensation Light Touch: Impaired by gross assessment Proprioception: Impaired by gross assessment Coordination Gross Motor Movements are Fluid and Coordinated: No Fine Motor Movements are Fluid and Coordinated: No Motor  Motor Motor: Abnormal postural alignment and control  Trunk/Postural Assessment  Cervical Assessment Cervical Assessment:  (head forward) Thoracic Assessment Thoracic Assessment:  (rounded soudlers) Lumbar Assessment Lumbar Assessment:  (post pelvic tilt) Postural Control Postural Control: Deficits on evaluation (delayed)  Balance Balance Balance Assessed: Yes Dynamic Sitting Balance Dynamic Sitting - Level of Assistance: 4: Min assist;5: Stand by assistance Static Standing Balance Static Standing - Level of Assistance: 3: Mod assist Static Standing - Comment/# of Minutes: in stedy Extremity/Trunk Assessment RUE Assessment General Strength Comments: generalized weakness LUE Assessment LUE Assessment: Exceptions to [facility] General Strength Comments: generalized  weakness; decreased coordination and inattention  Care Tool Care Tool Self Care Eating        Oral Care    Oral Care Assist Level: Minimal Assistance - Patient > 75%    Bathing              Upper Body Dressing(including orthotics)   What is the patient wearing?: Button up shirt   Assist Level: Maximal Assistance - Patient 25 - 49%    Lower Body Dressing (excluding footwear)   What is the patient wearing?: Underwear/pull up;Pants Assist for lower body dressing: Total Assistance - Patient < 25%    Putting on/Taking off footwear   What is the patient wearing?: Shoes;Orthosis Assist for footwear: Dependent - Patient 0%       Care Tool Toileting Toileting activity   Assist for toileting: Total Assistance - Patient < 25%     Care Tool Bed Mobility Roll left and right activity  Sit to lying activity        Lying to sitting edge of bed activity         Care Tool Transfers Sit to stand transfer   Sit to stand assist level: Dependent - Patient 0% (stedy)    Chair/bed transfer   Chair/bed transfer assist level: 2 Helpers     Toilet transfer   Assist Level: Dependent - Patient 0% (stedy)     Care Tool Cognition Expression of Ideas and Wants Expression of Ideas and Wants: Some difficulty - exhibits some difficulty with expressing needs and ideas (e.g, some words or finishing thoughts) or speech is not clear   Understanding Verbal and Non-Verbal Content Understanding Verbal and Non-Verbal Content: Usually understands - understands most conversations, but misses some part/intent of message. Requires cues at times to understand   Memory/Recall Ability *first 3 days only Memory/Recall Ability *first 3 days only: Current season;That he or she is in a hospital/hospital unit    Refer to Care Plan for [place] 1 OT Short Term Goal 1 (Week 1): Pt will don shirt wiht MIN A OT Short Term Goal 2 (Week 1): Pt will thread 1LE into pants OT  Short Term Goal 3 (Week 1): PT will sit to stnad with MOD A of 1 wiht LRAD in prep for LB dressing OT Short Term Goal 4 (Week 1): Pt will sequence oral care with no VC OT Short Term Goal 5 (Week 1): Pt will initate grooming with no VC  Recommendations for other services: None    Skilled Therapeutic Intervention Pt and husband present throughout session. Edu re TBI recovery, precautions, OT role/purpose, CIR, ELOS and POC. Pt participates in functional transfer training, toileting, dressing and grooming at EOB, sink and BSC level. Initial transfer OOB requires husbnad to stabilize w/c as pt pushing w/c away and unable ot motor plan pivot during squat pivot transfer. Used stedy with MOD-MAX A for further sit to stands and transfers to toilet and EOB with VC for terminal hip extension. Exited session with pt seated in bed, exit alarm on and call light tin reach  ADL ADL Grooming: Minimal assistance Where Assessed-Grooming: Sitting at sink Upper Body Bathing: Unable to assess Lower Body Bathing: Unable to assess Upper Body Dressing: Maximal assistance Where Assessed-Upper Body Dressing: Edge of bed Lower Body Dressing: Maximal assistance Where Assessed-Lower Body Dressing: Edge of bed;Bed level Toileting: Maximal assistance Where Assessed-Toileting: Bedside Commode Toilet Transfer: Dependent (stedy) Toilet Transfer Equipment: Drop arm bedside commode Mobility  Bed Mobility Bed Mobility: Rolling Right;Rolling Left;Sit to Supine;Supine to Sit Rolling Right: Moderate Assistance - Patient 50-74% Rolling Left: Moderate Assistance - Patient 50-74% Supine to Sit: Maximal Assistance - Patient - Patient 25-49% Sit to Supine: Maximal Assistance - Patient 25-49% Transfers Sit to Stand: Dependent - mechanical lift Stand to Sit: Dependent - mechanical lift   Discharge Criteria: Patient will be discharged from OT if patient refuses treatment 3 consecutive times without medical reason, if treatment  goals not met, if there is a change in medical status, if patient makes no progress towards goals or if patient is discharged from hospital.  The above assessment, treatment plan, treatment alternatives and goals were discussed and mutually agreed upon: by patient  Tonny Branch 07/09/2020, 12:25 PM

## 2020-07-09 NOTE — Progress Notes (Signed)
[facility] PHYSICAL MEDICINE & REHABILITATION PROGRESS NOTE   Subjective/Complaints: C/o low back pain. Felt she did well with SLP today.  Hgb 11.2 today.  Reviewed results of lumbar MRI with her and husband  ROS: + pain.    Objective:   No results found. Recent Labs    07/08/20 1752 07/09/20 0644  WBC 7.7 6.9  HGB 11.2* 11.2*  HCT 33.4* 33.4*  PLT 447* 448*   Recent Labs    07/08/20 1752 07/09/20 0644  NA  --  126*  K  --  3.8  CL  --  94*  CO2  --  22  GLUCOSE  --  132*  BUN  --  15  CREATININE 0.71 0.75  CALCIUM  --  9.1    Intake/Output Summary (Last 24 hours) at 07/09/2020 1000 Last data filed at 07/09/2020 0900 Gross per 24 hour  Intake 340 ml  Output --  Net 340 ml     Physical Exam: Vital Signs Blood pressure 117/60, pulse 69, temperature 98 F (36.7 C), temperature source Oral, resp. rate 17, height 5\' 4"  (1.626 m), weight 74.8 kg, last menstrual period 11/14/2003, SpO2 97 %.  General: Alert and oriented x 3, No apparent distress HEENT: Head is normocephalic, atraumatic, PERRLA, EOMI, sclera anicteric, oral mucosa pink and moist, dentition intact, ext ear canals clear,  Neck: Supple without JVD or lymphadenopathy Heart: Reg rate and rhythm. No murmurs rubs or gallops Chest: CTA bilaterally without wheezes, rales, or rhonchi; no distress Abdomen: Soft, non-tender, non-distended, bowel sounds positive. Genitourinary:    Comments: purewick in place Musculoskeletal:     Comments: UEs 4+/5 in deltoid, biceps, triceps, WE, grip and finger abd B/L- decreased effort due to pain LEs- HF 3-/5 on R; 3/5 on L; KE 4+/5, KF 4/5, DF and PF 3/5 on R; 3-/5    Skin:    Comments: No skin breakdown on backside- small stool- unaware of it Heels normal- no bogginess or wounds Most of hair shaved due to craniotomies  Neurological:     Comments: Patient is alert no acute distress.  Follows simple commands.  Provides her name and age.  She does display some decrease  in overall awareness.  Cooperative with exam. Tangential, cannot stay focused on topic, even eating- word finding issues noted Light touch intact in all 4 extremities- c/o back pain  Psychiatric:     Comments: Tangential- difficulty with focus; bright      Assessment/Plan: 1. Functional deficits secondary to traumatic subdural hematoma which require 3+ hours per day of interdisciplinary therapy in a comprehensive inpatient rehab setting.  Physiatrist is providing close team supervision and 24 hour management of active medical problems listed below.  Physiatrist and rehab team continue to assess barriers to discharge/monitor patient progress toward functional and medical goals  Care Tool:  Bathing              Bathing assist       Upper Body Dressing/Undressing Upper body dressing   What is the patient wearing?: Button up shirt    Upper body assist Assist Level: Moderate Assistance - Patient 50 - 74%    Lower Body Dressing/Undressing Lower body dressing            Lower body assist       Toileting Toileting    Toileting assist Assist for toileting: Maximal Assistance - Patient 25 - 49%     Transfers Chair/bed transfer  Transfers assist  Locomotion Ambulation   Ambulation assist              Walk 10 feet activity   Assist           Walk 50 feet activity   Assist           Walk 150 feet activity   Assist           Walk 10 feet on uneven surface  activity   Assist           Wheelchair     Assist               Wheelchair 50 feet with 2 turns activity    Assist            Wheelchair 150 feet activity     Assist          Blood pressure 117/60, pulse 69, temperature 98 F (36.7 C), temperature source Oral, resp. rate 17, height 5\' 4"  (1.626 m), weight 74.8 kg, last menstrual period 11/14/2003, SpO2 97 %.  Medical Problem List and Plan: 1.  Decreased functional ability  with gait abnormality secondary to large subacute subdural hematoma bilaterally with significant mass-effect.  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             -needs SLP for poor focus and tangential behavior             -patient may not shower until staples out- or can cover head             -ELOS/Goals: 2-3 weeks- goals Supervision to min A  -Initial CIR evals today.  2.  Antithrombotics: -DVT/anticoagulation: Subcutaneous heparin.  Check vascular study             -antiplatelet therapy: Aspirin 81 mg twice daily 3. Pain Management/headaches: Hydrocodone scheduled 10mg  q6H since she forgets to ask for pain medications, Topamax 75 mg nightly, Depakote 250 mg daily and 750 mg nightly 4. Mood: Klonopin 0.5 mg nightly as needed             -antipsychotic agents: N/A 5. Neuropsych: This patient is capable of making decisions on her own behalf. 6. Skin/Wound Care: Routine skin checks 7. Fluids/Electrolytes/Nutrition: Routine in and outs with follow-up chemistries 8.  Subdural hemorrhage extending throughout the length of the visualized spine mainly dorsal and most notable in the lower thoracic segments and at the L5-S1 level.  Conservative care consider trial of Neurontin. Reviewed results with patient and her husband.  9.  Seizure prophylaxis.  Keppra 500 mg twice daily 10.  Hypertension.  Cozaar 100 mg daily.  Monitor with increased mobility. Well controlled 11.  Hyperlipidemia.  Lipitor 12.  Neurogenic bladder.  Urecholine 10 mg 3 times daily.  Check PVR x3- has been retaining- might need Flomax 13.  History of alcohol use. 1-2 drinks/day per pt-  Provide counseling. 14.  Left nondisplaced fracture of left lateral malleolus and nondisplaced fracture of lateral malleolus right fibula sprain of tibiofibular ligament/right ankle after recent fall 06/14/2020.  Followed by Dr.  Louanne Skye.  Weightbearing as tolerated bilateral lower extremities.  Cam boot on left and lace up ankle brace on right. 15. Neurogenic bowel- incontinent- might need bowel program.  16. Of note, pt forgets to ask for pain meds- might let nursing know to offer pain meds     LOS: 1 days A FACE TO FACE EVALUATION  WAS PERFORMED  Leslyn Monda P Mishael Krysiak 07/09/2020, 10:00 AM

## 2020-07-09 NOTE — Progress Notes (Signed)
Inpatient Rehabilitation  Patient information reviewed and entered into eRehab system by Briahnna Harries M. Graciemae Delisle, M.A., CCC/SLP, PPS Coordinator.  Information including medical coding, functional ability and quality indicators will be reviewed and updated through discharge.    

## 2020-07-09 NOTE — Progress Notes (Signed)
Lower extremity venous bilateral study completed  Preliminary results relayed to Naaman Plummer, MD.   See CV Proc for preliminary results report.   Darlin Coco

## 2020-07-10 LAB — URINALYSIS, ROUTINE W REFLEX MICROSCOPIC
Bilirubin Urine: NEGATIVE
Glucose, UA: NEGATIVE mg/dL
Hgb urine dipstick: NEGATIVE
Ketones, ur: NEGATIVE mg/dL
Nitrite: POSITIVE — AB
Protein, ur: NEGATIVE mg/dL
Specific Gravity, Urine: 1.01 (ref 1.005–1.030)
pH: 6 (ref 5.0–8.0)

## 2020-07-10 NOTE — Progress Notes (Signed)
[facility] PHYSICAL MEDICINE & REHABILITATION PROGRESS NOTE   Subjective/Complaints: Has some low back pain, kpad ordered Discussed results of indeterminate clot with her. 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 Discussed that we can repeat US Monday to assess for change. Will not start anticoagulation given hematoma  ROS: + pain.    Objective:   VAS [place] LOWER EXTREMITY VENOUS (DVT)  Result Date: 07/09/2020  Lower Venous DVTStudy Indications: Swelling.  Comparison Study: 07-31-2017 Bilateral LEV available. Performing Technologist: Darlin Coco  Examination Guidelines: A complete evaluation includes B-mode imaging, spectral Doppler, color Doppler, and power Doppler as needed of all accessible portions of each vessel. Bilateral testing is considered an integral part of a complete examination. Limited examinations for reoccurring indications may be performed as noted. The reflux portion of the exam is performed with the patient in reverse Trendelenburg.  +---------+---------------+---------+-----------+----------+--------------+ RIGHT    CompressibilityPhasicitySpontaneityPropertiesThrombus Aging +---------+---------------+---------+-----------+----------+--------------+ CFV      Full           Yes      Yes                                 +---------+---------------+---------+-----------+----------+--------------+ SFJ      Full                                                        +---------+---------------+---------+-----------+----------+--------------+ FV Prox  Full                                                        +---------+---------------+---------+-----------+----------+--------------+ FV Mid   Full                                                        +---------+---------------+---------+-----------+----------+--------------+ FV DistalFull                                                         +---------+---------------+---------+-----------+----------+--------------+ PFV      Full                                                        +---------+---------------+---------+-----------+----------+--------------+ POP      Full           Yes      Yes                                 +---------+---------------+---------+-----------+----------+--------------+ PTV      Full                                                        +---------+---------------+---------+-----------+----------+--------------+  PERO     Full                                                        +---------+---------------+---------+-----------+----------+--------------+   +---------+---------------+---------+-----------+----------+-----------------+ LEFT     CompressibilityPhasicitySpontaneityPropertiesThrombus Aging    +---------+---------------+---------+-----------+----------+-----------------+ CFV      Full           Yes      Yes                                    +---------+---------------+---------+-----------+----------+-----------------+ SFJ      Full                                                           +---------+---------------+---------+-----------+----------+-----------------+ FV Prox  Full                                                           +---------+---------------+---------+-----------+----------+-----------------+ FV Mid   Full                                                           +---------+---------------+---------+-----------+----------+-----------------+ FV DistalFull                                                           +---------+---------------+---------+-----------+----------+-----------------+ PFV      Full                                                           +---------+---------------+---------+-----------+----------+-----------------+ POP      Full           Yes      Yes                                     +---------+---------------+---------+-----------+----------+-----------------+ PTV      None           No       No                   Age Indeterminate +---------+---------------+---------+-----------+----------+-----------------+ PERO     Full                                                           +---------+---------------+---------+-----------+----------+-----------------+  Summary: RIGHT: - There is no evidence of deep vein thrombosis in the lower extremity.  - No cystic structure found in the popliteal fossa.  LEFT: - Findings consistent with age indeterminate deep vein thrombosis involving a single left posterior tibial vein. - No cystic structure found in the popliteal fossa.  *See table(s) above for measurements and observations. Electronically signed by Servando Snare MD on 07/09/2020 at 4:55:02 PM.    Final    Recent Labs    07/08/20 1752 07/09/20 0644  WBC 7.7 6.9  HGB 11.2* 11.2*  HCT 33.4* 33.4*  PLT 447* 448*   Recent Labs    07/08/20 1752 07/09/20 0644  NA  --  126*  K  --  3.8  CL  --  94*  CO2  --  22  GLUCOSE  --  132*  BUN  --  15  CREATININE 0.71 0.75  CALCIUM  --  9.1    Intake/Output Summary (Last 24 hours) at 07/10/2020 1622 Last data filed at 07/10/2020 1545 Gross per 24 hour  Intake 480 ml  Output 1100 ml  Net -620 ml     Physical Exam: Vital Signs Blood pressure 103/61, pulse 68, temperature 98 F (36.7 C), temperature source Oral, resp. rate 17, height 5\' 4"  (1.626 m), weight 74.8 kg, last menstrual period 11/14/2003, SpO2 97 %. General: Alert and oriented x 3, No apparent distress HEENT: Head is normocephalic, atraumatic, PERRLA, EOMI, sclera anicteric, oral mucosa pink and moist, dentition intact, ext ear canals clear,  Neck: Supple without JVD or lymphadenopathy Heart: Reg rate and rhythm. No murmurs rubs or gallops Chest: CTA bilaterally without wheezes, rales, or rhonchi; no distress Abdomen: Soft, non-tender, non-distended,  bowel sounds positive. Genitourinary:    Comments: purewick in place Musculoskeletal:     Comments: UEs 4+/5 in deltoid, biceps, triceps, WE, grip and finger abd B/L- decreased effort due to pain LEs- HF 3-/5 on R; 3/5 on L; KE 4+/5, KF 4/5, DF and PF 3/5 on R; 3-/5    Skin:    Comments: No skin breakdown on backside- small stool- unaware of it Heels normal- no bogginess or wounds Most of hair shaved due to craniotomies  Neurological:     Comments: Patient is alert no acute distress.  Follows simple commands.  Provides her name and age.  She does display some decrease in overall awareness.  Cooperative with exam. Tangential, cannot stay focused on topic, even eating- word finding issues noted Light touch intact in all 4 extremities- c/o back pain  Psychiatric:     Comments: Tangential- difficulty with focus; bright       Assessment/Plan: 1. Functional deficits secondary to traumatic subdural hematoma which require 3+ hours per day of interdisciplinary therapy in a comprehensive inpatient rehab setting.  Physiatrist is providing close team supervision and 24 hour management of active medical problems listed below.  Physiatrist and rehab team continue to assess barriers to discharge/monitor patient progress toward functional and medical goals  Care Tool:  Bathing              Bathing assist       Upper Body Dressing/Undressing Upper body dressing   What is the patient wearing?: Button up shirt    Upper body assist Assist Level: Maximal Assistance - Patient 25 - 49%    Lower Body Dressing/Undressing Lower body dressing      What is the patient wearing?: Underwear/pull up, Pants     Lower body assist Assist for  lower body dressing: Total Assistance - Patient < 25%     Toileting Toileting    Toileting assist Assist for toileting: Maximal Assistance - Patient 25 - 49%     Transfers Chair/bed transfer  Transfers assist     Chair/bed transfer assist level:  Maximal Assistance - Patient 25 - 49% (squat-pivot)     Locomotion Ambulation   Ambulation assist   Ambulation activity did not occur: Safety/medical concerns (symptomatic hypotension)          Walk 10 feet activity   Assist  Walk 10 feet activity did not occur: Safety/medical concerns        Walk 50 feet activity   Assist Walk 50 feet with 2 turns activity did not occur: Safety/medical concerns         Walk 150 feet activity   Assist Walk 150 feet activity did not occur: Safety/medical concerns         Walk 10 feet on uneven surface  activity   Assist Walk 10 feet on uneven surfaces activity did not occur: Safety/medical concerns         Wheelchair     Assist Will patient use wheelchair at discharge?: No   Wheelchair activity did not occur: Safety/medical concerns         Wheelchair 50 feet with 2 turns activity    Assist    Wheelchair 50 feet with 2 turns activity did not occur: Safety/medical concerns       Wheelchair 150 feet activity     Assist  Wheelchair 150 feet activity did not occur: Safety/medical concerns       Blood pressure 103/61, pulse 68, temperature 98 F (36.7 C), temperature source Oral, resp. rate 17, height 5\' 4"  (1.626 m), weight 74.8 kg, last menstrual period 11/14/2003, SpO2 97 %.  Medical Problem List and Plan: 1.  Decreased functional ability with gait abnormality secondary to large subacute subdural hematoma bilaterally with significant mass-effect.  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             -needs SLP for poor focus and tangential behavior             -patient may not shower until staples out- or can cover head             -ELOS/Goals: 2-3 weeks- goals Supervision to min A  -Continue CIR 2.  Antithrombotics: -DVT/anticoagulation: Subcutaneous heparin.  vascular  study 8/27 shows age indeterminate clot. 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 Discussed repeating US Monday to assess for change. Will not start anticoagulation given hematoma.              -antiplatelet therapy: Aspirin 81 mg twice daily 3. Pain Management/headaches: Hydrocodone scheduled 10mg  q6H since she forgets to ask for pain medications, Topamax 75 mg nightly, Depakote 250 mg daily and 750 mg nightly. Worst pain is her low back pain- ordered kpad.  4. Mood: Klonopin 0.5 mg nightly as needed. Feels down about her condition at times. Has great sense of humor.              -antipsychotic agents: N/A 5. Neuropsych: This patient is capable of making decisions on her own behalf. 6. Skin/Wound Care: Routine skin checks 7. Fluids/Electrolytes/Nutrition: Routine in and outs with follow-up chemistries 8.  Subdural hemorrhage extending throughout the length of the visualized spine mainly dorsal and most notable in the lower  thoracic segments and at the L5-S1 level.  Conservative care consider trial of Neurontin. Reviewed results with patient and her husband.  9.  Seizure prophylaxis.  Keppra 500 mg twice daily 10.  Hypertension.  Cozaar 100 mg daily.  Monitor with increased mobility. Well controlled 11.  Hyperlipidemia.  Lipitor 12.  Neurogenic bladder.  Urecholine 10 mg 3 times daily.  Check PVR x3- has been retaining- might need Flomax 13.  History of alcohol use. 1-2 drinks/day per pt-  Provide counseling. 14.  Left nondisplaced fracture of left lateral malleolus and nondisplaced fracture of lateral malleolus right fibula sprain of tibiofibular ligament/right ankle after recent fall 06/14/2020.  Followed by Dr. Louanne Skye.  Weightbearing as tolerated bilateral lower extremities.  Cam boot on left and lace up ankle brace on right. 15. Neurogenic bowel- incontinent- might need bowel program.  16. Of note, pt forgets to ask for pain meds- might let nursing know to offer pain meds     LOS: 2 days A FACE  TO FACE EVALUATION WAS PERFORMED  Clide Deutscher Tunya Held 07/10/2020, 4:22 PM

## 2020-07-11 ENCOUNTER — Inpatient Hospital Stay (HOSPITAL_COMMUNITY): Payer: Medicare Other

## 2020-07-11 ENCOUNTER — Inpatient Hospital Stay (HOSPITAL_COMMUNITY): Payer: Medicare Other | Admitting: Speech Pathology

## 2020-07-11 MED ORDER — BISACODYL 10 MG RE SUPP
10.0000 mg | Freq: Once | RECTAL | Status: AC
  Administered 2020-07-11: 10 mg via RECTAL
  Filled 2020-07-11: qty 1

## 2020-07-11 MED ORDER — CEPHALEXIN 250 MG PO CAPS
500.0000 mg | ORAL_CAPSULE | Freq: Two times a day (BID) | ORAL | Status: DC
  Administered 2020-07-11 – 2020-07-12 (×2): 500 mg via ORAL
  Filled 2020-07-11 (×3): qty 2

## 2020-07-11 MED ORDER — LOSARTAN POTASSIUM 50 MG PO TABS
75.0000 mg | ORAL_TABLET | Freq: Every day | ORAL | Status: DC
  Administered 2020-07-12 – 2020-07-23 (×12): 75 mg via ORAL
  Filled 2020-07-11 (×12): qty 2

## 2020-07-11 NOTE — Progress Notes (Signed)
[facility] PHYSICAL MEDICINE & REHABILITATION PROGRESS NOTE   Subjective/Complaints: Has foul smelling urine. UA + for nitrites and leukocytes. Will start empiric keflex. UC pending Discussed with husband also plan to repeat US Monday Eating lunch  ROS: + pain.    Objective:   VAS [place] LOWER EXTREMITY VENOUS (DVT)  Result Date: 07/09/2020  Lower Venous DVTStudy Indications: Swelling.  Comparison Study: 07-31-2017 Bilateral LEV available. Performing Technologist: Darlin Coco  Examination Guidelines: A complete evaluation includes B-mode imaging, spectral Doppler, color Doppler, and power Doppler as needed of all accessible portions of each vessel. Bilateral testing is considered an integral part of a complete examination. Limited examinations for reoccurring indications may be performed as noted. The reflux portion of the exam is performed with the patient in reverse Trendelenburg.  +---------+---------------+---------+-----------+----------+--------------+ RIGHT    CompressibilityPhasicitySpontaneityPropertiesThrombus Aging +---------+---------------+---------+-----------+----------+--------------+ CFV      Full           Yes      Yes                                 +---------+---------------+---------+-----------+----------+--------------+ SFJ      Full                                                        +---------+---------------+---------+-----------+----------+--------------+ FV Prox  Full                                                        +---------+---------------+---------+-----------+----------+--------------+ FV Mid   Full                                                        +---------+---------------+---------+-----------+----------+--------------+ FV DistalFull                                                        +---------+---------------+---------+-----------+----------+--------------+ PFV      Full                                                         +---------+---------------+---------+-----------+----------+--------------+ POP      Full           Yes      Yes                                 +---------+---------------+---------+-----------+----------+--------------+ PTV      Full                                                        +---------+---------------+---------+-----------+----------+--------------+  PERO     Full                                                        +---------+---------------+---------+-----------+----------+--------------+   +---------+---------------+---------+-----------+----------+-----------------+ LEFT     CompressibilityPhasicitySpontaneityPropertiesThrombus Aging    +---------+---------------+---------+-----------+----------+-----------------+ CFV      Full           Yes      Yes                                    +---------+---------------+---------+-----------+----------+-----------------+ SFJ      Full                                                           +---------+---------------+---------+-----------+----------+-----------------+ FV Prox  Full                                                           +---------+---------------+---------+-----------+----------+-----------------+ FV Mid   Full                                                           +---------+---------------+---------+-----------+----------+-----------------+ FV DistalFull                                                           +---------+---------------+---------+-----------+----------+-----------------+ PFV      Full                                                           +---------+---------------+---------+-----------+----------+-----------------+ POP      Full           Yes      Yes                                    +---------+---------------+---------+-----------+----------+-----------------+ PTV      None           No       No                    Age Indeterminate +---------+---------------+---------+-----------+----------+-----------------+ PERO     Full                                                           +---------+---------------+---------+-----------+----------+-----------------+  Summary: RIGHT: - There is no evidence of deep vein thrombosis in the lower extremity.  - No cystic structure found in the popliteal fossa.  LEFT: - Findings consistent with age indeterminate deep vein thrombosis involving a single left posterior tibial vein. - No cystic structure found in the popliteal fossa.  *See table(s) above for measurements and observations. Electronically signed by Servando Snare MD on 07/09/2020 at 4:55:02 PM.    Final    Recent Labs    07/08/20 1752 07/09/20 0644  WBC 7.7 6.9  HGB 11.2* 11.2*  HCT 33.4* 33.4*  PLT 447* 448*   Recent Labs    07/08/20 1752 07/09/20 0644  NA  --  126*  K  --  3.8  CL  --  94*  CO2  --  22  GLUCOSE  --  132*  BUN  --  15  CREATININE 0.71 0.75  CALCIUM  --  9.1    Intake/Output Summary (Last 24 hours) at 07/11/2020 1354 Last data filed at 07/10/2020 2249 Gross per 24 hour  Intake 560 ml  Output 1100 ml  Net -540 ml     Physical Exam: Vital Signs Blood pressure 107/69, pulse 70, temperature 98.3 F (36.8 C), temperature source Oral, resp. rate 16, height 5\' 4"  (1.626 m), weight 74.8 kg, last menstrual period 11/14/2003, SpO2 95 %. General: Alert and oriented x 3, No apparent distress HEENT: Head is normocephalic, atraumatic, PERRLA, EOMI, sclera anicteric, oral mucosa pink and moist, dentition intact, ext ear canals clear,  Neck: Supple without JVD or lymphadenopathy Heart: Reg rate and rhythm. No murmurs rubs or gallops Chest: CTA bilaterally without wheezes, rales, or rhonchi; no distress Abdomen: Soft, non-tender, non-distended, bowel sounds positive. Genitourinary:    Comments: purewick in place Musculoskeletal:     Comments: UEs 4+/5 in deltoid,  biceps, triceps, WE, grip and finger abd B/L- decreased effort due to pain LEs- HF 3-/5 on R; 3/5 on L; KE 4+/5, KF 4/5, DF and PF 3/5 on R; 3-/5    Skin:    Comments: No skin breakdown on backside- small stool- unaware of it Heels normal- no bogginess or wounds Most of hair shaved due to craniotomies  Neurological:     Comments: Patient is alert no acute distress.  Follows simple commands.  Provides her name and age.  She does display some decrease in overall awareness.  Cooperative with exam. Tangential, cannot stay focused on topic, even eating- word finding issues noted Light touch intact in all 4 extremities- c/o back pain  Psychiatric:     Comments: Tangential- difficulty with focus; bright        Assessment/Plan: 1. Functional deficits secondary to traumatic subdural hematoma which require 3+ hours per day of interdisciplinary therapy in a comprehensive inpatient rehab setting.  Physiatrist is providing close team supervision and 24 hour management of active medical problems listed below.  Physiatrist and rehab team continue to assess barriers to discharge/monitor patient progress toward functional and medical goals  Care Tool:  Bathing              Bathing assist       Upper Body Dressing/Undressing Upper body dressing   What is the patient wearing?: Button up shirt    Upper body assist Assist Level: Maximal Assistance - Patient 25 - 49%    Lower Body Dressing/Undressing Lower body dressing      What is the patient wearing?: Underwear/pull up, Incontinence brief     Lower body assist  Assist for lower body dressing: Total Assistance - Patient < 25%     Toileting Toileting    Toileting assist Assist for toileting: Maximal Assistance - Patient 25 - 49%     Transfers Chair/bed transfer  Transfers assist     Chair/bed transfer assist level: Maximal Assistance - Patient 25 - 49% (squat-pivot)     Locomotion Ambulation   Ambulation assist    Ambulation activity did not occur: Safety/medical concerns (symptomatic hypotension)          Walk 10 feet activity   Assist  Walk 10 feet activity did not occur: Safety/medical concerns        Walk 50 feet activity   Assist Walk 50 feet with 2 turns activity did not occur: Safety/medical concerns         Walk 150 feet activity   Assist Walk 150 feet activity did not occur: Safety/medical concerns         Walk 10 feet on uneven surface  activity   Assist Walk 10 feet on uneven surfaces activity did not occur: Safety/medical concerns         Wheelchair     Assist Will patient use wheelchair at discharge?: No   Wheelchair activity did not occur: Safety/medical concerns         Wheelchair 50 feet with 2 turns activity    Assist    Wheelchair 50 feet with 2 turns activity did not occur: Safety/medical concerns       Wheelchair 150 feet activity     Assist  Wheelchair 150 feet activity did not occur: Safety/medical concerns       Blood pressure 107/69, pulse 70, temperature 98.3 F (36.8 C), temperature source Oral, resp. rate 16, height 5\' 4"  (1.626 m), weight 74.8 kg, last menstrual period 11/14/2003, SpO2 95 %.  Medical Problem List and Plan: 1.  Decreased functional ability with gait abnormality secondary to large subacute subdural hematoma bilaterally with significant mass-effect.  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             -needs SLP for poor focus and tangential behavior             -patient may not shower until staples out- or can cover head             -ELOS/Goals: 2-3 weeks- goals Supervision to min A  -Continue CIR 2.  Antithrombotics: -DVT/anticoagulation: Subcutaneous heparin.  vascular study 8/27 shows age indeterminate clot. 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 Discussed repeating US  Monday to assess for change. Will not start anticoagulation given hematoma.              -antiplatelet therapy: Aspirin 81 mg twice daily 3. Pain Management/headaches: Hydrocodone scheduled 10mg  q6H since she forgets to ask for pain medications, Topamax 75 mg nightly, Depakote 250 mg daily and 750 mg nightly. Worst pain is her low back pain- ordered kpad.  4. Mood: Klonopin 0.5 mg nightly as needed. Feels down about her condition at times. Has great sense of humor.              -antipsychotic agents: N/A 5. Neuropsych: This patient is capable of making decisions on her own behalf. 6. Skin/Wound Care: Routine skin checks 7. Fluids/Electrolytes/Nutrition: Routine in and outs with follow-up chemistries 8.  Subdural hemorrhage extending throughout the length of the visualized spine mainly dorsal and most notable in  the lower thoracic segments and at the L5-S1 level.  Conservative care consider trial of Neurontin. Reviewed results with patient and her husband.  9.  Seizure prophylaxis.  Keppra 500 mg twice daily 10.  Hypertension.  Cozaar 100 mg daily.  Monitor with increased mobility. Well controlled  8/29: hypotensive last 8 reads. Will decrease Cozaar to 75mg .  11.  Hyperlipidemia.  Lipitor 12.  Neurogenic bladder.  Urecholine 10 mg 3 times daily.  Check PVR x3- has been retaining- might need Flomax  8/29: Cloudy urine. UA + for nitrites and leukocytes. Started empiric Keflex. Culture pending.  13.  History of alcohol use. 1-2 drinks/day per pt-  Provide counseling. 14.  Left nondisplaced fracture of left lateral malleolus and nondisplaced fracture of lateral malleolus right fibula sprain of tibiofibular ligament/right ankle after recent fall 06/14/2020.  Followed by Dr. Louanne Skye.  Weightbearing as tolerated bilateral lower extremities.  Cam boot on left and lace up ankle brace on right. 15. Neurogenic bowel- incontinent- might need bowel program.  16. Of note, pt forgets to ask for pain meds- might let  nursing know to offer pain meds     LOS: 3 days A FACE TO FACE EVALUATION WAS PERFORMED  Martha Clan P Evanthia Maund 07/11/2020, 1:54 PM

## 2020-07-11 NOTE — IPOC Note (Signed)
Overall Plan of Care [facility]) Patient Details Name: Amanda Collier MRN: [id number] DOB: 07-15-1946  Admitting Diagnosis: Traumatic subdural hematoma [facility])  Hospital Problems: Principal Problem:   Traumatic subdural hematoma ([place])     Functional Problem List: Nursing Bladder, Bowel, Edema, Medication Management, Pain, Safety, Skin Integrity, Behavior  PT Balance, Behavior, Endurance, Motor, Nutrition, Safety, Sensory, Perception  OT Balance, Behavior, Cognition, Endurance, Motor, Pain, Perception, Safety, Sensory, Skin Integrity, Vision  SLP Cognition  TR         Basic ADL's: OT Grooming, Bathing, Dressing, Toileting     Advanced  ADL's: OT       Transfers: PT Bed Mobility, Bed to Chair, Teacher, early years/pre, Tub/Shower     Locomotion: PT Ambulation, Stairs     Additional Impairments: OT Fuctional Use of Upper Extremity  SLP Communication, Social Cognition expression Social Interaction, Problem Solving, Memory, Attention  TR      Anticipated Outcomes Item Anticipated Outcome  Self Feeding S  Swallowing      Basic self-care  S  Toileting  S   Bathroom Transfers S  Bowel/Bladder  patient will be able to have elimination needs met while on CIR  Transfers  CGA  Locomotion  CGA  Communication  Mod I  Cognition  Supervision  Pain  Patients pain will be managed to an acceptable level while on CIR  Safety/Judgment  Patient will have no falls with injury while on CIR   Therapy Plan: PT Intensity: Minimum of 1-2 x/day ,45 to 90 minutes PT Frequency: 5 out of 7 days PT Duration Estimated Length of Stay: 3-4 weeks OT Intensity: Minimum of 1-2 x/day, 45 to 90 minutes OT Frequency: 5 out of 7 days OT Duration/Estimated Length of Stay: 3-4 weeks SLP Intensity: Minumum of 1-2 x/day, 30 to 90 minutes SLP Frequency: 3 to 5 out of 7 days SLP Duration/Estimated Length of Stay: 3-4 weeks   Due to the current state of emergency, patients may not be receiving their  3-hours of Medicare-mandated therapy.   Team Interventions: Nursing Interventions Patient/Family Education, Bladder Management, Bowel Management, Disease Management/Prevention, Pain Management, Medication Management, Skin Care/Wound Management, Cognitive Remediation/Compensation, Dysphagia/Aspiration Precaution Training, Discharge Planning, Psychosocial Support  PT interventions Ambulation/gait training, Discharge planning, Functional mobility training, Psychosocial support, Therapeutic Activities, Visual/perceptual remediation/compensation, Wheelchair propulsion/positioning, Neuromuscular re-education, Skin care/wound management, Therapeutic Exercise, Disease management/prevention, Medical illustrator training, Cognitive remediation/compensation, DME/adaptive equipment instruction, Pain management, Splinting/orthotics, UE/LE Strength taining/ROM, UE/LE Coordination activities, Stair training, Patient/family education, Functional electrical stimulation, Community reintegration  OT Interventions Training and development officer, Discharge planning, Pain management, Self Care/advanced ADL retraining, Therapeutic Activities, UE/LE Coordination activities, Visual/perceptual remediation/compensation, Therapeutic Exercise, Skin care/wound managment, Patient/family education, Functional mobility training, Cognitive remediation/compensation, Disease mangement/prevention, Academic librarian, Engineer, drilling, Psychosocial support, Neuromuscular re-education, Splinting/orthotics, UE/LE Strength taining/ROM, Wheelchair propulsion/positioning  SLP Interventions Cognitive remediation/compensation, Speech/Language facilitation, Therapeutic Activities, Environmental controls, Cueing hierarchy, Functional tasks, Patient/family education, Internal/external aids  TR Interventions    SW/CM Interventions Discharge Planning, Psychosocial Support, Patient/Family Education   Barriers to Discharge MD  Medical  stability  Nursing      PT Inaccessible home environment, Decreased caregiver support, Home environment access/layout, Lack of/limited family support, Weight bearing restrictions bed/bath on 2nd lvl with full flight to access. 4-5 STE home  OT Inaccessible home environment, Weight bearing restrictions    SLP      SW       Team Discharge Planning: Destination: PT-  ,OT- Home , SLP-Home Projected Follow-up: PT-24 hour supervision/assistance, Home health PT,  Outpatient PT (OPPT vs HHPT pending progress), OT-  Home health OT, SLP-Home Health SLP, Outpatient SLP, 24 hour supervision/assistance Projected Equipment Needs: PT-To be determined, OT- To be determined, Tub/shower seat, 3 in 1 bedside comode, SLP-None recommended by SLP Equipment Details: PT-Husband repors they have a RW, OT-  Patient/family involved in discharge planning: PT- Patient, Family Midwife,  OT-Patient, Family member/caregiver, SLP-Patient  MD ELOS: 2-3 weeks Medical Rehab Prognosis:  Excellent Assessment: Amanda Collier is a 74 year old woman admitted to CIR with decreased functional ability with gait abnormality secondary to large subacute subdural hematoma bilaterally with significant mass-effect. 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 Vascular study on 8/27 shows age indeterminate clot- study will be repeated on Monday. Her pain is being controlled with Hydrocodone, Topamax, Depakote, and kpad. Her anxiety is managed with Klonopin. She is on Keppra for seizure prophylaxis. Her BP is being monitored three times per day and is well controlled on Cozaar. Her neurogenic bladder is being managed with urecholine.     See Team Conference Notes for weekly updates to the plan of care

## 2020-07-11 NOTE — Progress Notes (Signed)
Patient tried to have a bowel movement but failed. RN checked rectal vault and felt formed stool; tried to disimpact but stool keeps going in. Dr. Ranell Patrick informed with order.

## 2020-07-11 NOTE — Progress Notes (Signed)
Occupational Therapy Session Note  Patient Details  Name: Amanda Collier MRN: [id number] Date of Birth: 28-Feb-1946  Today's Date: 07/11/2020 OT Individual Time: 6803-2122 OT Individual Time Calculation (min): 70 min    Short Term Goals: Week 1:  OT Short Term Goal 1 (Week 1): Pt will don shirt wiht MIN A OT Short Term Goal 2 (Week 1): Pt will thread 1LE into pants OT Short Term Goal 3 (Week 1): PT will sit to stnad with MOD A of 1 wiht LRAD in prep for LB dressing OT Short Term Goal 4 (Week 1): Pt will sequence oral care with no VC OT Short Term Goal 5 (Week 1): Pt will initate grooming with no VC  Skilled Therapeutic Interventions/Progress Updates:    Pt received supine, assisting RN in boosting up pt in bed to improve positioning to eat breakfast. Pt verbose throughout session requiring frequent redirection to task. BP obtained supine- 100/57. Pt able to recall her LE precautions and injuries. Pt required mod A to transition to EOB. Increased time and cueig required to scoot hips forward and maintain EOB posture. Cam boot and ankle brace donned total A. Attempted squat pivot x3 but pt was unable to motor plan transfer nor initiate with max A. Pt was returned to supine with max A. For safety of pt and therapist a stedy was obtained and +2 assistance. Pt completed sit > stand in the stedy with mod A, requiring most assistance to come into complete stand and rotate pelvis neutrally. Pt was transferred to her w/c dependently. Pt completed UB bathing with min cueing for initiation. Pt donned shirt with min A to pull down posteriorly. Pt completed sit > stand with mod A with BUE support on the sink. Pt required heavy assist to remain standing with R lean. Pt required total A for peri hygiene in standing. Total A to don pants 2/2 time constraints. Pt was left sitting up in the w/c with her breakfast positioned in front of her. Chair alarm belt fastened.   Therapy Documentation Precautions:   Precautions Precautions: Fall Precaution Comments: drains removed Required Braces or Orthoses: Other Brace Other Brace: L cam boot; R lace up ankle brace; has sneaker for R foot Restrictions Weight Bearing Restrictions: Yes RLE Weight Bearing: Weight bearing as tolerated LLE Weight Bearing: Weight bearing as tolerated Other Position/Activity Restrictions: WBAT with braces  Therapy/Group: Individual Therapy  Curtis Sites 07/11/2020, 7:11 AM

## 2020-07-11 NOTE — Progress Notes (Signed)
Speech Language Pathology Daily Session Note  Patient Details  Name: SULEMA BRAID MRN: [id number] Date of Birth: [birth year]  Today's Date: 07/11/2020 SLP Individual Time: 1403-1500 SLP Individual Time Calculation (min): 57 min  Short Term Goals: Week 1: SLP Short Term Goal 1 (Week 1): Patient will utilize speech intelligibility strategies at the conversation levle with Min A verbal cues. SLP Short Term Goal 2 (Week 1): Patient will demonstrate functional problem solving for mildly complex tasks with Mod A verbal cues. SLP Short Term Goal 3 (Week 1): Patient will recall new, daily information with Mod A verbal and visual cues. SLP Short Term Goal 4 (Week 1): Patient will demonstrate sustained attention to functional tasks for 30 minutes with Mod verbal cues for redirection.  Skilled Therapeutic Interventions: Pt was seen for skilled ST targeting cognitive goals. Upon arrival, pt requested bedpan. She stated awareness of her incontinence but that she should attempt to urinate. Max A verbal and visual cues were required for sustained attention and to reduce verbosity while performing bed mobility and attempting to use bedpan. Pt ultimately unable to void. During a structured money management task using cash and coins, pt's sustained attention improved, requiring only Min A verbal cues for redirection. Mod A verbal cues (repetition) and use of written aid required for recall within money management task, as well as for functional problem solving. Pt identified memory and attention as 2 acute cognitive deficits in functional conversation. She also accurately recalled earlier OT and PT sessions with Min A question cues. Pt left laying in bed with alarm set and needs within reach. Continue per current plan of care.          Pain Pain Assessment Pain Scale: 0-10 Pain Score: 0-No pain  Therapy/Group: Individual Therapy  Arbutus Leas 07/11/2020, 3:07 PM

## 2020-07-11 NOTE — Progress Notes (Signed)
Physical Therapy Session Note  Patient Details  Name: Amanda Collier MRN: [id number] Date of Birth: [birth year]  Today's Date: 07/11/2020 PT Individual Time: 1004-1107 and 1300-1327 PT Individual Time Calculation (min): 63 min and 27 min   Short Term Goals: Week 1:  PT Short Term Goal 1 (Week 1): Pt will perform bed mobility with modA PT Short Term Goal 2 (Week 1): Pt will maintain sitting EOB >5 minutes with CGA PT Short Term Goal 3 (Week 1): Pt will perform bed<>chair transfer with modA and LRAD PT Short Term Goal 4 (Week 1): Pt will initiate pre-gait and standing activities with modA and LRAD  Skilled Therapeutic Interventions/Progress Updates:     Session 1: Patient in w/c in the room with her husband leaving the room upon PT arrival. Patient alert and agreeable to PT session. Patient reported 4-5/10 back pain during session, RN made aware. PT provided repositioning, rest breaks, and distraction as pain interventions throughout session.  Patient's husband reported that his wife had asked him to step out due to frustration. Showed him where the family room was and educated patient and her husband on general BI education including, lability, poor frustration tolerance, decreased cognition and memory, and provided strategies for her husband to communicate with the patient when she may be confused or frustrated. Patient and her husband appreciative of education.   Patient reported dizziness in sitting. BP 88/66, HR 83. Patient returned to bed, see mobility below, BP 80/52 HR 72. Applied thigh high TED hose following discussion with LPN, after 10 min in supine and TEDs donned BP 84/55, HR 75, RN made aware.  Therapeutic Activity: Bed Mobility: Patient performed sit to supine for LE managment. Provided verbal cues for performing log roll technique for comfort due to back pain. Doffed R ankle brace and L CAM boot with total A once patient returned to bed. Noted mild B ankle edema and increased  redness from braces that resolved <10 min with braces doffed. Transfers: Patient performed a squat pivot transfer w/c>bed with max A. Provided cues for hand placement and head-hips relationship for proper technique and decreased assist with transfers. Noted patient tentative and shaking during transfer. Patient reports significant fear of falling since having several falls PTA. Also, noted increased time for initiation and heavy facilitation for transfer due to poor motor planning with transfer.  Patient was tangential throughout session. Attempted to have patient work out time-line for falls and hospital course of stay. Patient unable to attend and would veer off topic during discussion. Easily redirectable to original topic, but unable to return to appropriate part of time-line. Perseverated on her orthopedic doctor not being informed of her hospital admission.   Therapeutic Exercise: Patient performed the following exercises with verbal and tactile cues for proper technique. -bridging x5 -B heel slides x10 -B SLR x10  Patient in bed with her husband at bedside at end of session with breaks locked, bed alarm set, and all needs within reach. Patient required increased time with all mobility due to dizziness and poor attention throughout session.  Session 2: Patient in bed with her lunch tray in front of her, untouched, upon PT arrival. Patient alert and agreeable to PT session. Patient reported 4/10 back pain during session, RN made aware. PT provided repositioning, rest breaks, and distraction as pain interventions throughout session.   Reported dizziness lying in the bed. BP 79/50, HR 69. Sat EOB BP 113/79, HR 74 then dropped to 95/54 and patient requesting to lie down due  to feeling light-headed after 2 min in sitting. RN made aware of vitals. Thigh high TED hose donned throughout session.   Therapeutic Activity: Bed Mobility: Patient performed supine to site with min A for trunk and LE  support using bed rail and sit to supine with mod A for LE managment. Provided verbal cues for performing log roll technique for comfort due to back pain. She performed scooting up in the bed with max-total A of 1 person with bed in trendelenburg position.   Educated patient on potential causes for hypotension and interventions that may be implemented once discussed with MD. Encouraged patient to eat and drink well to improve hypotention and set patient up to eat lunch at end of session.    Patient continues to be tangential with poor attention throughout session resulting in increased time for all mobility.   Patient in bed at end of session with breaks locked, bed alarm set, and all needs within reach.    Therapy Documentation Precautions:  Precautions Precautions: Fall Precaution Comments: drains removed Required Braces or Orthoses: Other Brace Other Brace: L cam boot; R lace up ankle brace; has sneaker for R foot Restrictions Weight Bearing Restrictions: Yes RLE Weight Bearing: Weight bearing as tolerated LLE Weight Bearing: Weight bearing as tolerated Other Position/Activity Restrictions: WBAT with braces   Therapy/Group: Individual Therapy  Jamaurion Slemmer L Naziah Weckerly PT, DPT  07/11/2020, 3:54 PM

## 2020-07-12 ENCOUNTER — Inpatient Hospital Stay (HOSPITAL_COMMUNITY): Payer: Medicare Other | Admitting: Speech Pathology

## 2020-07-12 ENCOUNTER — Inpatient Hospital Stay (HOSPITAL_COMMUNITY): Payer: Medicare Other

## 2020-07-12 ENCOUNTER — Encounter (HOSPITAL_COMMUNITY): Payer: Medicare Other

## 2020-07-12 DIAGNOSIS — S065X1S Traumatic subdural hemorrhage with loss of consciousness of 30 minutes or less, sequela: Secondary | ICD-10-CM

## 2020-07-12 DIAGNOSIS — E871 Hypo-osmolality and hyponatremia: Secondary | ICD-10-CM

## 2020-07-12 DIAGNOSIS — S8262XS Displaced fracture of lateral malleolus of left fibula, sequela: Secondary | ICD-10-CM

## 2020-07-12 DIAGNOSIS — A499 Bacterial infection, unspecified: Secondary | ICD-10-CM

## 2020-07-12 DIAGNOSIS — S8261XS Displaced fracture of lateral malleolus of right fibula, sequela: Secondary | ICD-10-CM

## 2020-07-12 DIAGNOSIS — N390 Urinary tract infection, site not specified: Secondary | ICD-10-CM

## 2020-07-12 LAB — CBC
HCT: 34.4 % — ABNORMAL LOW (ref 36.0–46.0)
Hemoglobin: 11.6 g/dL — ABNORMAL LOW (ref 12.0–15.0)
MCH: 32.1 pg (ref 26.0–34.0)
MCHC: 33.7 g/dL (ref 30.0–36.0)
MCV: 95.3 fL (ref 80.0–100.0)
Platelets: 438 10*3/uL — ABNORMAL HIGH (ref 150–400)
RBC: 3.61 MIL/uL — ABNORMAL LOW (ref 3.87–5.11)
RDW: 11.7 % (ref 11.5–15.5)
WBC: 6.8 10*3/uL (ref 4.0–10.5)
nRBC: 0 % (ref 0.0–0.2)

## 2020-07-12 LAB — BASIC METABOLIC PANEL
Anion gap: 13 (ref 5–15)
BUN: 14 mg/dL (ref 8–23)
CO2: 20 mmol/L — ABNORMAL LOW (ref 22–32)
Calcium: 8.9 mg/dL (ref 8.9–10.3)
Chloride: 95 mmol/L — ABNORMAL LOW (ref 98–111)
Creatinine, Ser: 0.7 mg/dL (ref 0.44–1.00)
GFR calc Af Amer: >60 mL/min (ref >60)
GFR calc non Af Amer: >60 mL/min (ref >60)
Glucose, Bld: 124 mg/dL — ABNORMAL HIGH (ref 70–99)
Potassium: 3.5 mmol/L (ref 3.5–5.1)
Sodium: 128 mmol/L — ABNORMAL LOW (ref 135–145)

## 2020-07-12 MED ORDER — SODIUM CHLORIDE 0.9 % IV SOLN
1.0000 g | Freq: Once | INTRAVENOUS | Status: AC
  Administered 2020-07-12: 1 g via INTRAVENOUS
  Filled 2020-07-12: qty 1

## 2020-07-12 MED ORDER — DIVALPROEX SODIUM ER 500 MG PO TB24
750.0000 mg | ORAL_TABLET | Freq: Every day | ORAL | Status: DC
  Administered 2020-07-12 – 2020-07-16 (×5): 750 mg via ORAL
  Administered 2020-07-17: 250 mg via ORAL
  Administered 2020-07-18 – 2020-08-03 (×17): 750 mg via ORAL
  Filled 2020-07-12 (×23): qty 1

## 2020-07-12 NOTE — Progress Notes (Signed)
Speech Language Pathology Daily Session Note  Patient Details  Name: Amanda Collier MRN: [id number] Date of Birth: [birth year]  Today's Date: 07/12/2020 SLP Individual Time: 9470-7615 SLP Individual Time Calculation (min): 27 min  Short Term Goals: Week 1: SLP Short Term Goal 1 (Week 1): Patient will utilize speech intelligibility strategies at the conversation levle with Min A verbal cues. SLP Short Term Goal 2 (Week 1): Patient will demonstrate functional problem solving for mildly complex tasks with Mod A verbal cues. SLP Short Term Goal 3 (Week 1): Patient will recall new, daily information with Mod A verbal and visual cues. SLP Short Term Goal 4 (Week 1): Patient will demonstrate sustained attention to functional tasks for 30 minutes with Mod verbal cues for redirection.  Skilled Therapeutic Interventions: Pt was seen for skilled ST targeting cognitive goals. Due to time constraints, SLP did not target putting together pill box according to pt's personal list of medications (as recommended by earlier ST session). However, basic medication management task from the ALFA administered, during which pt required Min A verbal cues for interpreting information from medication labels (specifcally, dosages) to accurately organize a basic QID pill chart. Pt also required Mod A verbal cues for redirection throughout session, due to internal distractions. Pt left sitting upright in bed with alarm on and needs within reach. Continue per current plan of care.       Pain Pain Assessment Pain Scale: 0-10 Pain Score: 0-No pain  Therapy/Group: Individual Therapy  Arbutus Leas 07/12/2020, 3:01 PM

## 2020-07-12 NOTE — Progress Notes (Signed)
Physical Therapy Session Note  Patient Details  Name: Amanda Collier MRN: [id number] Date of Birth: January 24, 1946  Today's Date: 07/12/2020 PT Individual Time: 1045-1130 PT Individual Time Calculation (min): 45 min   Short Term Goals: Week 1:  PT Short Term Goal 1 (Week 1): Pt will perform bed mobility with modA PT Short Term Goal 2 (Week 1): Pt will maintain sitting EOB >5 minutes with CGA PT Short Term Goal 3 (Week 1): Pt will perform bed<>chair transfer with modA and LRAD PT Short Term Goal 4 (Week 1): Pt will initiate pre-gait and standing activities with modA and LRAD  Skilled Therapeutic Interventions/Progress Updates:     Patient in bed asleep upon PT arrival. Patient required verbal and tactile stimulation to arouse and remained lethargic with difficulty opening her eyes throughout session. Patient agreeable to PT session to trial sitting EOB and getting OOB if tolerated. Patient reported unrated low back pain with grimacing with mobility during session, RN made aware. PT provided repositioning, rest breaks, and distraction as pain interventions throughout session.   Patient with labile BP with sitting EOB during session. Also reports dizziness and reports the room is spinning with mobility, especially rolling with horizontal nystagmus lasting >30 sec x1. Mobility limited by lethargy and dizziness during session. Patient also with intermittent confusion and slurred speech due to lethargy.  Vitals:  Supine: BP 116/70, HR 68 Sitting: BP 161/88, MAP 110, HR 97 Sitting x2 min: BP 138/78, HR 72  Therapeutic Activity: Bed Mobility: Patient performed supine to/from sit and rolling R/L with mod-max A with bed flat and use of be rails. Provided verbal cues for performing log rolling to manage back pain, pushing through B UEs to sit up, and bringing knees to chest. She sat EOB ~2 min with min A-CGA with mild L posterior lean. Provided cues and facilitation for midline orientation and L trunk  elongation. Patient then asking to lie down due to lethargy and "feeling woozy."  Patient was incontinent of bowl during session, however, she did report when she was having a bowl movement. Required total A for bed pan placement, LB clothing management, and peri-care during toileting.   Patient in bed with NTs in the room at end of session with breaks locked, bed alarm set, and all needs within reach. RN and MD made aware of patient's change in presentation today. Patient missed 30 min of skilled PT due to lethargy/dizziness, RN made aware. Will attempt to make-up missed time as able.     Therapy Documentation Precautions:  Precautions Precautions: Fall Precaution Comments: drains removed Required Braces or Orthoses: Other Brace Other Brace: L cam boot; R lace up ankle brace; has sneaker for R foot Restrictions Weight Bearing Restrictions: Yes RLE Weight Bearing: Weight bearing as tolerated LLE Weight Bearing: Weight bearing as tolerated Other Position/Activity Restrictions: WBAT with braces General: PT Amount of Missed Time (min): 30 Minutes PT Missed Treatment Reason: Patient fatigue   Therapy/Group: Individual Therapy  Emalene Welte L Sayuri Rhames PT, DPT  07/12/2020, 12:34 PM

## 2020-07-12 NOTE — Progress Notes (Signed)
Occupational Therapy Session Note  Patient Details  Name: Amanda Collier MRN: [id number] Date of Birth: Aug 11, 1946  Today's Date: 07/12/2020 OT Individual Time: 1000-1040 OT Individual Time Calculation (min): 40 min    Short Term Goals: Week 1:  OT Short Term Goal 1 (Week 1): Pt will don shirt wiht MIN A OT Short Term Goal 2 (Week 1): Pt will thread 1LE into pants OT Short Term Goal 3 (Week 1): PT will sit to stnad with MOD A of 1 wiht LRAD in prep for LB dressing OT Short Term Goal 4 (Week 1): Pt will sequence oral care with no VC OT Short Term Goal 5 (Week 1): Pt will initate grooming with no VC  Skilled Therapeutic Interventions/Progress Updates:    Pt received supine with husband Merry Proud present initially but leaving soon thereafter. Focus of session on hemodynamic stability and sitting balance. Pt quite drowsy/lethargic this session, drifting off to sleep frequently. Pt aware she has UTI. Pt oriented x4. Pt's brief was checked and she was incontinent of BM. Max A hygiene provided and brief changed. Pt required mod A to roll R and L. Pt was given mod cueing for log rolling technique to transition from supine to EOB- mod lifting assistance required as well. Pt with L lean, requiring multimodal cueing to maintain midline orientation. Min-mod A overall required for sitting balance. Balance improved with therapist positioned directly anterior to pt and with cueing for her BUE to be resting on therapist legs to encourage forward weight shift- this also visibly reduced pt fear of falling. BP obtained as reported below. Pt reported increasing dizziness and requested to return to supine. Mod A required to transition back into bed. With bed in trend position pt required mod A and mod cueing for scooting herself up in bed. Pt was left supine with HOB elevated to 40 degrees to increase alertness and more upright posture. Bed alarm set.   BP Supine: 114/64 BP EOB 0 min: 138/81 BP EOB 2 min:  143/79  Therapy Documentation Precautions:  Precautions Precautions: Fall Precaution Comments: drains removed Required Braces or Orthoses: Other Brace Other Brace: L cam boot; R lace up ankle brace; has sneaker for R foot Restrictions Weight Bearing Restrictions: Yes RLE Weight Bearing: Weight bearing as tolerated LLE Weight Bearing: Weight bearing as tolerated Other Position/Activity Restrictions: WBAT with braces  Therapy/Group: Individual Therapy  Curtis Sites 07/12/2020, 6:56 AM

## 2020-07-12 NOTE — Plan of Care (Addendum)
Behavioral Plan   [place] Level: VIII  Behavior to decrease/ eliminate:  Verbosity   Changes to environment:  Lights on blinds up during day, lights off and quiet environment at night  Limit distractions during functional tasks  Interventions: Redirect as needed    Recommendations for interactions with patient: Discourage multi-tasking  Attendees:  Weston Anna, SLP, Laverle Hobby, OT, Apolinar Junes, PT,

## 2020-07-12 NOTE — Progress Notes (Signed)
[facility] PHYSICAL MEDICINE & REHABILITATION PROGRESS NOTE   Subjective/Complaints: Pt up in bed. EXTREMELY talkative, alert with me. Therpy reports later this morning that pt was more drowsy and lethargic. Pt c/o neck pain and coccycgeal pain this morning.   ROS: Patient denies fever, rash, sore throat, blurred vision, nausea, vomiting, diarrhea, cough, shortness of breath or chest pain, headache, or mood change.    Objective:   No results found. No results for input(s): WBC, HGB, HCT, PLT in the last 72 hours. No results for input(s): NA, K, CL, CO2, GLUCOSE, BUN, CREATININE, CALCIUM in the last 72 hours.  Intake/Output Summary (Last 24 hours) at 07/12/2020 1157 Last data filed at 07/12/2020 0633 Gross per 24 hour  Intake 400 ml  Output 1700 ml  Net -1300 ml     Physical Exam: Vital Signs Blood pressure 126/63, pulse 70, temperature 98.4 F (36.9 C), resp. rate 17, height 5\' 4"  (1.626 m), weight 74.8 kg, last menstrual period 11/14/2003, SpO2 97 %. Constitutional: No distress . Vital signs reviewed. HEENT: EOMI, oral membranes moist. Scalp wounds cdi Neck: supple Cardiovascular: RRR without murmur. No JVD    Respiratory/Chest: CTA Bilaterally without wheezes or rales. Normal effort    GI/Abdomen: BS +, non-tender, non-distended Ext: no clubbing, cyanosis, or edema Psych: pleasant and cooperative Musculoskeletal:     Comments: UEs 4+/5 in deltoid, biceps, triceps, WE, grip and finger abd B/L- decreased effort due to pain LEs- HF 3-/5 on R; 3/5 on L; KE 4+/5, KF 4/5, DF and PF 3/5 on R; 3-/5   -tended along lower sacrum, no bruising or swelling.  Skin:    Comments: No skin breakdown on backside- Heels normal- no bogginess or wounds Most of hair shaved due to craniotomies  Neurological:     Comments: alert, oriented to person, place. Extremely verbose and tangential. Light touch intact in all 4 extremities- moves all 4's.         Assessment/Plan: 1. Functional  deficits secondary to traumatic subdural hematoma which require 3+ hours per day of interdisciplinary therapy in a comprehensive inpatient rehab setting.  Physiatrist is providing close team supervision and 24 hour management of active medical problems listed below.  Physiatrist and rehab team continue to assess barriers to discharge/monitor patient progress toward functional and medical goals  Care Tool:  Bathing              Bathing assist       Upper Body Dressing/Undressing Upper body dressing   What is the patient wearing?: Button up shirt    Upper body assist Assist Level: Maximal Assistance - Patient 25 - 49%    Lower Body Dressing/Undressing Lower body dressing      What is the patient wearing?: Underwear/pull up, Incontinence brief     Lower body assist Assist for lower body dressing: Total Assistance - Patient < 25%     Toileting Toileting    Toileting assist Assist for toileting: Total Assistance - Patient < 25%     Transfers Chair/bed transfer  Transfers assist     Chair/bed transfer assist level: Maximal Assistance - Patient 25 - 49%     Locomotion Ambulation   Ambulation assist   Ambulation activity did not occur: Safety/medical concerns (symptomatic hypotension)          Walk 10 feet activity   Assist  Walk 10 feet activity did not occur: Safety/medical concerns        Walk 50 feet activity   Assist Walk  50 feet with 2 turns activity did not occur: Safety/medical concerns         Walk 150 feet activity   Assist Walk 150 feet activity did not occur: Safety/medical concerns         Walk 10 feet on uneven surface  activity   Assist Walk 10 feet on uneven surfaces activity did not occur: Safety/medical concerns         Wheelchair     Assist Will patient use wheelchair at discharge?: No   Wheelchair activity did not occur: Safety/medical concerns         Wheelchair 50 feet with 2 turns  activity    Assist    Wheelchair 50 feet with 2 turns activity did not occur: Safety/medical concerns       Wheelchair 150 feet activity     Assist  Wheelchair 150 feet activity did not occur: Safety/medical concerns       Blood pressure 126/63, pulse 70, temperature 98.4 F (36.9 C), resp. rate 17, height 5\' 4"  (1.626 m), weight 74.8 kg, last menstrual period 11/14/2003, SpO2 97 %.  Medical Problem List and Plan: 1.  Decreased functional ability with gait abnormality secondary to large subacute subdural hematoma bilaterally with significant mass-effect.  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             -needs SLP for poor focus and tangential behavior             -patient may shower--dry head well immediately after shower             -ELOS/Goals: 2-3 weeks- goals Supervision to min A  -Continue CIR  -lethargy: check labs, rx UTI as below. If persistent issues will re-check CT of head 2.  Antithrombotics: -DVT/anticoagulation: Subcutaneous heparin.  vascular study 8/27 shows age indeterminate clot left posterior tib vein. 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   -repeat u/s at some point             -antiplatelet therapy: Aspirin 81 mg twice daily 3. Pain Management/headaches: Hydrocodone scheduled 10mg  q6H since she forgets to ask for pain medications, Topamax 75 mg nightly, Depakote 250 mg daily and 750 mg nightly.   Worst pain is her low back/sacral pain- continue kpad, appropriate positioning/posture in bed and chair 4. Mood: Klonopin 0.5 mg nightly as needed. Feels down about her condition at times. Has great sense of humor.              -antipsychotic agents: N/A 5. Neuropsych: This patient is capable of making decisions on her own behalf. 6. Skin/Wound Care: Routine skin checks 7. Fluids/Electrolytes/Nutrition: pt hyponatremic on most recent  labs  -recheck blood work today 8.  Subdural hemorrhage extending throughout the length of the visualized spine mainly dorsal and most notable in the lower thoracic segments and at the L5-S1 level.  Unclear origin  - continue consvt care  9.  Seizure prophylaxis.  Keppra 500 mg twice daily 10.  Hypertension.  Cozaar 100 mg daily.  Monitor with increased mobility. Well controlled  8/30 bp better, continue cozaar 75mg   -encourage fluids 11.  Hyperlipidemia.  Lipitor 12.  Neurogenic bladder.  Urecholine 10 mg 3 times daily.  Check PVR x3- has been retaining- might need Flomax  8/30 100k E coli in urine  -day 2 keflex, sensitivity pending. Given her episode of lethargy, will go ahead and give dose  of IV rocephin today and re-evaluate tomorrow 53.  History of alcohol use. 1-2 drinks/day per pt-  Provide counseling. 14.  Left nondisplaced fracture of left lateral malleolus and nondisplaced fracture of lateral malleolus right fibula sprain of tibiofibular ligament/right ankle after recent fall 06/14/2020.     - WBAT bilateral lower extremities.    -Cam boot on left and lace up ankle brace on right. 15. Neurogenic bowel- incontinent- might need bowel program.        LOS: 4 days A FACE TO FACE EVALUATION WAS PERFORMED  Meredith Staggers 07/12/2020, 11:57 AM

## 2020-07-12 NOTE — Progress Notes (Addendum)
Physical Therapy Note  Patient Details  Name: Amanda Collier MRN: [id number] Date of Birth: 08-17-1946 Today's Date: 07/12/2020    Returned to attempt to make up missed time with patient. Patient asleep in bed with her husband at bedside upon PT arrival. Patient difficulty to arouse, breathing deeply. Required several attempts of verbal and tactile stimulation to arouse patient. Patient reported that she has been sleeping deeply all day. She did recall that Dr. Naaman Plummer, MD came by to discuss patient's new onset of lethargy today. Patient attempted to report what the doctor had told her, however, fell back asleep while speaking. PT relayed what the doctor wrote in the patient's chart to the husband. Also, reviewed behavior plan with patient and her husband, however, patient asleep. Posted behavior plan on patient's bathroom door for staff review. Patient sleeping soundly at this time. Will attempt to make up missed time at a later time as able.    Tor Tsuda L Jamail Cullers PT, DPT  07/12/2020, 11:32 AM

## 2020-07-12 NOTE — Progress Notes (Signed)
Speech Language Pathology Daily Session Note  Patient Details  Name: Amanda Collier MRN: [id number] Date of Birth: 1946-09-17  Today's Date: 07/12/2020 SLP Individual Time: 0725-0820 SLP Individual Time Calculation (min): 55 min  Short Term Goals: Week 1: SLP Short Term Goal 1 (Week 1): Patient will utilize speech intelligibility strategies at the conversation levle with Min A verbal cues. SLP Short Term Goal 2 (Week 1): Patient will demonstrate functional problem solving for mildly complex tasks with Mod A verbal cues. SLP Short Term Goal 3 (Week 1): Patient will recall new, daily information with Mod A verbal and visual cues. SLP Short Term Goal 4 (Week 1): Patient will demonstrate sustained attention to functional tasks for 30 minutes with Mod verbal cues for redirection.  Skilled Therapeutic Interventions: Skilled treatment session focused on cognitive goals. Upon arrival, patient was asleep with her breakfast tray in front of her. SLP provided Max verbal and tactile cues for arousal. Max A multimodal cues were also needed for attention to self-feeding and tray set-up due to verbosity with tangents. Despite more than a reasonable amount of time, patient was unable to finish her breakfast tray within the session.  SLP also facilitated session by providing Min A verbal cues for recall of her current medications and their functions. Recommend organizing a pill box during next session. Overall, patient appeared more lethargic today. Patient left upright in bed with alarm on and all needs within reach. Continue with current plan of care.      Pain No/Denies Pain   Therapy/Group: Individual Therapy  Amanda Collier 07/12/2020, 12:23 PM

## 2020-07-12 NOTE — Plan of Care (Signed)
  Problem: Consults Goal: RH BRAIN INJURY PATIENT EDUCATION Description: Description: See Patient Education module for eduction specifics Outcome: Progressing Goal: Skin Care Protocol Initiated - if Braden Score 18 or less Description: If consults are not indicated, leave blank or document N/A Outcome: Progressing Goal: Nutrition Consult-if indicated Outcome: Progressing   Problem: RH BOWEL ELIMINATION Goal: RH STG MANAGE BOWEL WITH ASSISTANCE Description: STG Manage Bowel with mod I Assistance. Outcome: Progressing Goal: RH STG MANAGE BOWEL W/MEDICATION W/ASSISTANCE Description: STG Manage Bowel with Medication with mod I Assistance. Outcome: Progressing   Problem: RH BLADDER ELIMINATION Goal: RH STG MANAGE BLADDER WITH ASSISTANCE Description: STG Manage Bladder With mod I Assistance Outcome: Progressing Goal: RH STG MANAGE BLADDER WITH MEDICATION WITH ASSISTANCE Description: STG Manage Bladder With Medication With mod I Assistance. Outcome: Progressing   Problem: RH SKIN INTEGRITY Goal: RH STG SKIN FREE OF INFECTION/BREAKDOWN Description: Pt will be free of skin breakdown/infection prior to DC with min assist.  Outcome: Progressing Goal: RH STG ABLE TO PERFORM INCISION/WOUND CARE W/ASSISTANCE Description: STG Able To Perform Incision/Wound Care With min Assistance. Outcome: Progressing   Problem: RH SAFETY Goal: RH STG ADHERE TO SAFETY PRECAUTIONS W/ASSISTANCE/DEVICE Description: STG Adhere to Safety Precautions With cues/reminders with AD Outcome: Progressing   Problem: RH COGNITION-NURSING Goal: RH STG USES MEMORY AIDS/STRATEGIES W/ASSIST TO PROBLEM SOLVE Description: STG Uses Memory Aids/Strategies With cues/reminders Assistance to Problem Solve. Outcome: Progressing   Problem: RH PAIN MANAGEMENT Goal: RH STG PAIN MANAGED AT OR BELOW PT'S PAIN GOAL Description: Less than 3 on 0-10 scale Outcome: Progressing   Problem: RH KNOWLEDGE DEFICIT BRAIN INJURY Goal: RH  STG INCREASE KNOWLEDGE OF SELF CARE AFTER BRAIN INJURY Description: Pt will be able to demonstrate understanding of BI teaching, safety precautions, falls, and medication management with supervision assist using handouts/booklets prior to DC.  Outcome: Progressing   Problem: Consults Goal: RH BRAIN INJURY PATIENT EDUCATION Description: Description: See Patient Education module for eduction specifics Outcome: Progressing

## 2020-07-13 ENCOUNTER — Inpatient Hospital Stay (HOSPITAL_COMMUNITY): Payer: Medicare Other | Admitting: Speech Pathology

## 2020-07-13 ENCOUNTER — Inpatient Hospital Stay (HOSPITAL_COMMUNITY): Payer: Medicare Other

## 2020-07-13 LAB — URINE CULTURE: Culture: >=100000 — AB

## 2020-07-13 LAB — BASIC METABOLIC PANEL
Anion gap: 12 (ref 5–15)
BUN: 10 mg/dL (ref 8–23)
CO2: 22 mmol/L (ref 22–32)
Calcium: 9.4 mg/dL (ref 8.9–10.3)
Chloride: 98 mmol/L (ref 98–111)
Creatinine, Ser: 0.67 mg/dL (ref 0.44–1.00)
GFR calc Af Amer: >60 mL/min (ref >60)
GFR calc non Af Amer: >60 mL/min (ref >60)
Glucose, Bld: 110 mg/dL — ABNORMAL HIGH (ref 70–99)
Potassium: 3.5 mmol/L (ref 3.5–5.1)
Sodium: 132 mmol/L — ABNORMAL LOW (ref 135–145)

## 2020-07-13 MED ORDER — CEPHALEXIN 250 MG PO CAPS
500.0000 mg | ORAL_CAPSULE | Freq: Two times a day (BID) | ORAL | Status: AC
  Administered 2020-07-14 – 2020-07-18 (×10): 500 mg via ORAL
  Filled 2020-07-13 (×10): qty 2

## 2020-07-13 MED ORDER — SODIUM CHLORIDE 0.9 % IV SOLN
1.0000 g | Freq: Once | INTRAVENOUS | Status: AC
  Administered 2020-07-13: 1 g via INTRAVENOUS
  Filled 2020-07-13: qty 1

## 2020-07-13 MED ORDER — METHYLPHENIDATE HCL 5 MG PO TABS
5.0000 mg | ORAL_TABLET | Freq: Two times a day (BID) | ORAL | Status: DC
  Administered 2020-07-14 – 2020-07-16 (×5): 5 mg via ORAL
  Filled 2020-07-13 (×5): qty 1

## 2020-07-13 NOTE — Progress Notes (Signed)
Occupational Therapy Session Note  Patient Details  Name: Amanda Collier MRN: [id number] Date of Birth: May 10, 1946  Today's Date: 07/13/2020 OT Individual Time: 1330-1415 OT Individual Time Calculation (min): 45 min    Short Term Goals: Week 1:  OT Short Term Goal 1 (Week 1): Pt will don shirt wiht MIN A OT Short Term Goal 2 (Week 1): Pt will thread 1LE into pants OT Short Term Goal 3 (Week 1): PT will sit to stnad with MOD A of 1 wiht LRAD in prep for LB dressing OT Short Term Goal 4 (Week 1): Pt will sequence oral care with no VC OT Short Term Goal 5 (Week 1): Pt will initate grooming with no VC  Skilled Therapeutic Interventions/Progress Updates:    Pt received eating lunch EOB with PT. Pt with much improved alertness and sitting balance EOB. Pt with slight R lean when drinking but able to self correct. BP EOB 140/80. Pt suddenly reported high back pain and returned to supine quickly. Min A to return to supine. Pt required mod cueing and min A to scoot up in bed using bed features- rail and slight trend feature. Pt given several minutes to rest. HOB elevated and pt completed oral care with set up assist. Pt declined coming EOB 2/2 back pain but was willing to complete therapeutic activity supine. Pt completed simple-moderate pipe tree puzzles with min-mod cueing for both following far model, working memory, and sequencing orientation of pieces. Pt with fair error recognition overall throughout task. Great improvement in sustained attention to task this session. Pt was left supine with all needs met, bed alarm set.   Therapy Documentation Precautions:  Precautions Precautions: Fall Precaution Comments: drains removed Required Braces or Orthoses: Other Brace Other Brace: L cam boot; R lace up ankle brace; has sneaker for R foot Restrictions Weight Bearing Restrictions: Yes RLE Weight Bearing: Weight bearing as tolerated LLE Weight Bearing: Weight bearing as tolerated Other  Position/Activity Restrictions: WBAT with braces   Therapy/Group: Individual Therapy  Curtis Sites 07/13/2020, 7:09 AM

## 2020-07-13 NOTE — Progress Notes (Signed)
Patient ID: Amanda Collier, female   DOB: 11-12-1946, 74 y.o.   MRN: [id number]  SW met with pt in room to provide updates from team conference, and d/c date 9/22. SW to follow-up with pt husband to provide updates as he was at home waiting ot meet with contractors for renovation.    Loralee Pacas, MSW, [place] Office: [phone] Cell: [phone] Fax: [phone]

## 2020-07-13 NOTE — Progress Notes (Signed)
[facility] PHYSICAL MEDICINE & REHABILITATION PROGRESS NOTE   Subjective/Complaints: Pt had a reasonable night. Says she's feeling ok today. Ready for more therapy. Still with headache but manageable.   ROS: Patient denies fever, rash, sore throat, blurred vision, nausea, vomiting, diarrhea, cough, shortness of breath or chest pain, joint or back pain,  or mood change.    Objective:   No results found. Recent Labs    07/12/20 1230  WBC 6.8  HGB 11.6*  HCT 34.4*  PLT 438*   Recent Labs    07/12/20 1230 07/13/20 0608  NA 128* 132*  K 3.5 3.5  CL 95* 98  CO2 20* 22  GLUCOSE 124* 110*  BUN 14 10  CREATININE 0.70 0.67  CALCIUM 8.9 9.4    Intake/Output Summary (Last 24 hours) at 07/13/2020 1226 Last data filed at 07/13/2020 0444 Gross per 24 hour  Intake --  Output 450 ml  Net -450 ml     Physical Exam: Vital Signs Blood pressure 122/75, pulse 66, temperature (!) 97.5 F (36.4 C), resp. rate 18, height 5\' 4"  (1.626 m), weight 74.8 kg, last menstrual period 11/14/2003, SpO2 95 %. Constitutional: No distress . Vital signs reviewed. HEENT: EOMI, oral membranes moist Neck: supple Cardiovascular: RRR without murmur. No JVD    Respiratory/Chest: CTA Bilaterally without wheezes or rales. Normal effort    GI/Abdomen: BS +, non-tender, non-distended Ext: no clubbing, cyanosis, or edema Psych: pleasant and cooperative Musculoskeletal:   -tended along lower sacrum, no bruising or swelling seen.   Skin:    Crani incisions CDI. Neurological:     Comments: alert, oriented to person, place. Remains verbose and tangential. Distracted.  Light touch intact in all 4 extremities- moves all 4's.  Motor: 4+/5 in UE. LE 3-/5 prox to 4-/5 distally with pain inhibition in both feet       Assessment/Plan: 1. Functional deficits secondary to traumatic subdural hematoma which require 3+ hours per day of interdisciplinary therapy in a comprehensive inpatient rehab  setting.  Physiatrist is providing close team supervision and 24 hour management of active medical problems listed below.  Physiatrist and rehab team continue to assess barriers to discharge/monitor patient progress toward functional and medical goals  Care Tool:  Bathing  Bathing activity did not occur: Safety/medical concerns (Per MD orders )           Bathing assist       Upper Body Dressing/Undressing Upper body dressing   What is the patient wearing?: Button up shirt    Upper body assist Assist Level: Maximal Assistance - Patient 25 - 49%    Lower Body Dressing/Undressing Lower body dressing      What is the patient wearing?: Underwear/pull up, Incontinence brief     Lower body assist Assist for lower body dressing: Total Assistance - Patient < 25%     Toileting Toileting    Toileting assist Assist for toileting: Total Assistance - Patient < 25%     Transfers Chair/bed transfer  Transfers assist     Chair/bed transfer assist level: Maximal Assistance - Patient 25 - 49%     Locomotion Ambulation   Ambulation assist   Ambulation activity did not occur: Safety/medical concerns (symptomatic hypotension)          Walk 10 feet activity   Assist  Walk 10 feet activity did not occur: Safety/medical concerns        Walk 50 feet activity   Assist Walk 50 feet with 2 turns activity  did not occur: Safety/medical concerns         Walk 150 feet activity   Assist Walk 150 feet activity did not occur: Safety/medical concerns         Walk 10 feet on uneven surface  activity   Assist Walk 10 feet on uneven surfaces activity did not occur: Safety/medical concerns         Wheelchair     Assist Will patient use wheelchair at discharge?: No   Wheelchair activity did not occur: Safety/medical concerns         Wheelchair 50 feet with 2 turns activity    Assist    Wheelchair 50 feet with 2 turns activity did not occur:  Safety/medical concerns       Wheelchair 150 feet activity     Assist  Wheelchair 150 feet activity did not occur: Safety/medical concerns       Blood pressure 122/75, pulse 66, temperature (!) 97.5 F (36.4 C), resp. rate 18, height 5\' 4"  (1.626 m), weight 74.8 kg, last menstrual period 11/14/2003, SpO2 95 %.  Medical Problem List and Plan: 1.  Decreased functional ability with gait abnormality secondary to large subacute subdural hematoma bilaterally with significant mass-effect.  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             -needs SLP for poor focus and tangential behavior             -patient may shower--dry head well immediately after shower             -ELOS/Goals: 08/04/20- goals Supervision to min A  -Continue CIR  -lethargy: likely d/t UTI. Looks better today 8/31. Continue to observe 2.  Antithrombotics: -DVT/anticoagulation: Subcutaneous heparin.  vascular study 8/27 shows age indeterminate clot left posterior tib vein. 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   -repeat u/s next week             -antiplatelet therapy: Aspirin 81 mg twice daily 3. Pain Management/headaches: Hydrocodone scheduled 10mg  q6H since she forgets to ask for pain medications, Topamax 75 mg nightly, Depakote 250 mg daily and 750 mg nightly.   Worst pain is her low back/sacral pain- continue kpad, appropriate positioning/posture in bed and chair 4. Mood: Klonopin 0.5 mg nightly as needed.   -remains motivated. Occasionally anxious             -antipsychotic agents: N/A 5. Neuropsych: This patient is capable of making decisions on her own behalf.  -consider trial of ritalin to improve concentration 6. Skin/Wound Care: Routine skin checks 7. Fluids/Electrolytes/Nutrition: pt hyponatremic on most recent labs  -sodium up to 132 today  -f/u Friday 8.  Subdural hemorrhage  extending throughout the length of the visualized spine mainly dorsal and most notable in the lower thoracic segments and at the L5-S1 level.  Unclear origin  - continue consvt care  9.  Seizure prophylaxis.  Keppra 500 mg twice daily 10.  Hypertension.  Cozaar 100 mg daily.  Monitor with increased mobility. Well controlled  8/30 bp better, continue cozaar 75mg   -encourage fluids 11.  Hyperlipidemia.  Lipitor 12.  Neurogenic bladder.  Urecholine 10 mg 3 times daily.  Check PVR x3- has been retaining- might need Flomax  8/30 100k E coli in urine S to ctx/cephalexin   -8/31 give one more dose of ctx today then change back to cephalexin for 5 days 13.  History of alcohol use. 1-2 drinks/day per pt-  Provide counseling. 14.  Left nondisplaced fracture of left lateral malleolus and nondisplaced fracture of lateral malleolus right fibula sprain of tibiofibular ligament/right ankle after recent fall 06/14/2020.     - WBAT bilateral lower extremities.    -Cam boot on left and lace up ankle brace on right. 15. Neurogenic bowel- incontinent  -should improve as mobility and cognitively she improves       LOS: 5 days A FACE TO FACE EVALUATION WAS PERFORMED  Meredith Staggers 07/13/2020, 12:26 PM

## 2020-07-13 NOTE — Progress Notes (Signed)
Physical Therapy Session Note  Patient Details  Name: Amanda Collier MRN: [id number] Date of Birth: 1946/04/19  Today's Date: 07/13/2020 PT Individual Time: 1030-1135 and 1300-1330 PT Individual Time Calculation (min): 65 min and 30 min   Short Term Goals: Week 1:  PT Short Term Goal 1 (Week 1): Pt will perform bed mobility with modA PT Short Term Goal 2 (Week 1): Pt will maintain sitting EOB >5 minutes with CGA PT Short Term Goal 3 (Week 1): Pt will perform bed<>chair transfer with modA and LRAD PT Short Term Goal 4 (Week 1): Pt will initiate pre-gait and standing activities with modA and LRAD  Skilled Therapeutic Interventions/Progress Updates:     Session 1: Patient in bed upon PT arrival. Patient alert and agreeable to PT session, reports feeling better than yesterday, but still having dizziness when rolling in the bed. Patient reported 2-3/10 low back/sacral pain during session, RN made aware. PT provided repositioning, rest breaks, and distraction as pain interventions throughout session. Patient was alert and oriented throughout session. Continues to be easily distracted and tangential, however, improved today from previous days. BP stable throughout session.  Orthostatic Vitals:  Supine: BP 104/72, HR 63 (asymptomatic) Sitting: BP 139/82, HR 64 (asymptomatic) Standing: BP 133/62, HR 71 (asymptomatic)  Therapeutic Activity: Bed Mobility: Donned B TED hose and pants with patient in supine with total A prior to mobility. Patient performed a small bridge following cues for technique to allow PT to pull pants up over her hips. Patient performed supine to/from sit with min A for trunk and LE support. Provided verbal cues for performing log rolling to reduce back pain with mobility. Patient denied dizziness when sitting up, but reported dizziness with horizontal beating nystagmus upon rolling from R side-lying to supine when returning to lying at end of session. Symptoms and nystagmus  lasted ~30 sec, then resolved. Patient sat EOB with supervision today as PT donned L CAM boot and R ankle brace and shoe with total A.  Transfers: Patient performed sit to/from stand transfers using the steady bed>BSC over the toilet and BSC>w/c with min A for boosting from lower surfaces and CGA for steadying support standing from the Arnold seat. Provided verbal cues for hand placement, forward weight shift, and hip and knee extension in standing. Patient was continent of bowl on the [facility], only a very small BM at this time. Required total A for peri-care and LB dressing in standing due to decreased balance without UE support. Provided cues for attending to toileting due to distractibility, and avoiding Val Slava maneuver to reduced increased intraabdominal pressure and risk of elevated BP or syncope during toileting. Encouraged patient to relax and perform gentle rocking to stimulate bowls. She performed a stand pivot back to bed with mod A with B hands on PT's shoulders. Required facilitation for boosting up, forward weight shift, weight shift for small steps to pivot, and controlled descent. Provided cues for technique and sequencing throughout. Patient was physically shaking during the transfer due to increased anxiety about falling. PT provided reassurance during and after the transfer and patient stated that she did feel safe during the transfer, but has a lot of residual fear from multiple falls PTA.   Patient sat in TIS w/c with hybrid foam and Roho cushion with 30 deg recline. PT placed pillows behind her back and neck for comfort and adjusted leg rests length and height for appropriate sitting posture and patient comfort. Patient stated that she felt comfortable in the chair and  tolerated sitting x5 min before requested to return to the bed due to fatigue and B ankle soreness, reports soreness is from wearing her braces.   Patient in bed at end of session with breaks locked, bed alarm set, and all  needs within reach.   Session 2: Patient sitting up in bed eating lunch, <10% of lunch eaten at this time and TV on upon PT arrival. Patient alert and agreeable to PT session. Patient denied pain during session. Focused session on sitting balance/tolerance and attention focused on eating lunch sitting EOB. Patient reported that Dr. Naaman Plummer had discussed adding a medication to assist her with her attention and stated that she needed to work on attending to eating as well.   Therapeutic Activity: Bed Mobility: Patient performed supine to sit as above without reports of dizziness. Patient sat EOB with supervision 25 min with intermittent cues for erect posture and L trunk elongation due to L and posterior lean with fatigue. Patient ate >75% of her meal with cues x2 to attend to eating her lunch rather than talking. 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 Also, discussed patient's progress with mobility in previous session and goals for initiating gait training in // bars tomorrow if progress continues. Patient in agreement and attentive to education while maintaining focus on eating her lunch.   Patient in sitting EOB when handed off to [place] at end of session.   Therapy Documentation Precautions:  Precautions Precautions: Fall Precaution Comments: drains removed Required Braces or Orthoses: Other Brace Other Brace: L cam boot; R lace up ankle brace; has sneaker for R foot Restrictions Weight Bearing Restrictions: Yes RLE Weight Bearing: Weight bearing as tolerated LLE Weight Bearing: Weight bearing as tolerated Other Position/Activity Restrictions: WBAT with braces   Therapy/Group: Individual Therapy  Suhail Peloquin L Neithan Day PT, DPT  07/13/2020, 12:57 PM

## 2020-07-13 NOTE — Progress Notes (Signed)
Speech Language Pathology Daily Session Note  Patient Details  Name: Amanda Collier MRN: [id number] Date of Birth: 21-Apr-1946  Today's Date: 07/13/2020 SLP Individual Time: 0725-0825 SLP Individual Time Calculation (min): 60 min  Short Term Goals: Week 1: SLP Short Term Goal 1 (Week 1): Patient will utilize speech intelligibility strategies at the conversation levle with Min A verbal cues. SLP Short Term Goal 2 (Week 1): Patient will demonstrate functional problem solving for mildly complex tasks with Mod A verbal cues. SLP Short Term Goal 3 (Week 1): Patient will recall new, daily information with Mod A verbal and visual cues. SLP Short Term Goal 4 (Week 1): Patient will demonstrate sustained attention to functional tasks for 30 minutes with Mod verbal cues for redirection.  Skilled Therapeutic Interventions: Skilled treatment session focused on cognitive goals. Upon arrival, patient had not began eating her breakfast. SLP facilitated session by providing set-up assist to maximize attention and completion of the meal. Patient required Max A verbal cues to self-feeding due to verbosity with tangents. Prolonged mastication was also noted today due to poor awareness of bolus. Patient reported she had completed her breakfast after ~30 minutes and had only consumed ~25% of her meal. Recommend patient continue current diet. SLP also facilitated session by providing overall Mod A verbal cues for organization and problem solving during a complex medication management task. Task will be completed during next session. Patient left upright in bed with alarm on and all needs within reach. Continue with current plan of care.      Pain Pain Assessment Pain Scale: 0-10 Pain Score: 4  Pain Type: Acute pain Pain Location: Back Pain Orientation: Lower Pain Descriptors / Indicators: Aching Pain Frequency: Constant Pain Onset: On-going Patients Stated Pain Goal: 2 Pain Intervention(s): Medication (See  eMAR)  Therapy/Group: Individual Therapy  Felicia Bloomquist 07/13/2020, 10:06 AM

## 2020-07-13 NOTE — Patient Care Conference (Signed)
Inpatient RehabilitationTeam Conference and Plan of Care Update Date: 07/13/2020   Time: 10:08 AM    Patient Name: Amanda Collier      Medical Record Number: [id number]  Date of Birth: [birth year] Sex: Female         Room/Bed: 4W20C/4W20C-01 Payor Info: Payor: MEDICARE / Plan: MEDICARE PART A AND B / Product Type: *No Product type* /    Admit Date/Time:  07/08/2020  4:58 PM  Primary Diagnosis:  Traumatic subdural hematoma [facility])  Hospital Problems: Principal Problem:   Traumatic subdural hematoma [facility])    Expected Discharge Date: Expected Discharge Date: 08/04/20  Team Members Present: Physician leading conference: Dr. Alger Simons Care Coodinator Present: Loralee Pacas, LCSWA;Andrina Locken Creig Hines, RN, BSN, [place] Nurse Present: Rayne Du, LPN PT Present: Apolinar Junes, PT OT Present: Laverle Hobby, OT SLP Present: Weston Anna, SLP PPS Coordinator present : Ileana Ladd, Burna Mortimer, SLP     Current Status/Progress Goal Weekly Team Focus  Bowel/Bladder   Patient is currently being I&O cathed or incontinent for bladder and continent of bowel  Patient will become continent of bowel and bladder  Assess need for toileting schedule   Swallow/Nutrition/ Hydration             ADL's   Max A +2 transfers, max A +2 LB dressing, min A UB dressing. Poor attention to task, rancho VII  Supervision overall  ADL retraining, cognition, attention, ADL transfers   Mobility   Mod A bed mobility, max A transfers, min A sitting balance, limited by hypotension x2 days and lethargy x1 day  Min A overall, gait 75 ft and 8 steps to second floor of home  Arousal, interventions and assessment for dizziness, functional mobility, activity tolerance, sitting balance, sitting tolerance, initiate gait training, attention, patient/caregiver education   Communication   Min A  Mod I  increase use of speech intelligibility strategies   Safety/Cognition/ Behavioral Observations  Mod-Max A   Supervision  attention, self-monitoring of verbosity, recall and problem solving   Pain   Patient currenlty reports pain 6 or above of pain  Patient will remain below a 6 for pain  Assess need for pain medication each shift   Skin   Patient's skin is currently intact with no issues besides inicision sites, which have staples  Skin will remain free of infection  Assess skin each shift for breakdown     Discharge Planning:  D/c to home with her husband who will provide 24/7 care. Reports they are in the process of renovations at their home and there is a bedroom and bathroom upstairs where pt can stay.   Team Discussion: Patient has bouts of confusion, Continent of Bowel, Incontinent of bladder and I&O cathed. Max +2 transfers, Max+2 lower body dressing, Supervision goals. Min assist goals and stair goals, has stood in the stedy. Reg textures with thin liquids, setup assist, intermittent assist, poor attention and concentration. Patient on target to meet rehab goals: yes  *See Care Plan and progress notes for long and short-term goals.   Revisions to Treatment Plan:  None  Teaching Needs: Continue with family education  Current Barriers to Discharge: Hypotension and back pain is limiting.  Possible Resolutions to Barriers: Monitor patient's blood pressure and continue with medication regimen.      Medical Summary Current Status: bilateral SDH's. bilateral ankle fx's. pain issues, urine retention/UTI. recent fatigue and somnolence likely d/t uti  Barriers to Discharge: Medical stability   Possible Resolutions to Barriers/Weekly Focus: rx uti,  mgt nutrition/labs, maximize sleep-wake and pain control   Continued Need for Acute Rehabilitation Level of Care: The patient requires daily medical management by a physician with specialized training in physical medicine and rehabilitation for the following reasons: Direction of a multidisciplinary physical rehabilitation program to maximize  functional independence : Yes Medical management of patient stability for increased activity during participation in an intensive rehabilitation regime.: Yes Analysis of laboratory values and/or radiology reports with any subsequent need for medication adjustment and/or medical intervention. : Yes   I attest that I was present, lead the team conference, and concur with the assessment and plan of the team.   Cristi Loron 07/13/2020, 1:24 PM

## 2020-07-14 ENCOUNTER — Inpatient Hospital Stay (HOSPITAL_COMMUNITY): Payer: Medicare Other | Admitting: Speech Pathology

## 2020-07-14 ENCOUNTER — Inpatient Hospital Stay (HOSPITAL_COMMUNITY): Payer: Medicare Other

## 2020-07-14 MED ORDER — SORBITOL 70 % SOLN
60.0000 mL | Status: AC
  Administered 2020-07-14: 60 mL via ORAL
  Filled 2020-07-14: qty 60

## 2020-07-14 MED ORDER — BISACODYL 10 MG RE SUPP
10.0000 mg | Freq: Every day | RECTAL | Status: DC | PRN
  Administered 2020-07-16: 10 mg via RECTAL
  Filled 2020-07-14: qty 1

## 2020-07-14 NOTE — Progress Notes (Signed)
Patient ID: Amanda Collier, female   DOB: 09-13-1946, 74 y.o.   MRN: [id number]   Sw spoke with pt husband Amanda Collier [phone]) to provide updates from team conference, and pt d/c date. SW dicussed family education closer towards end of discharge. Discussed alternative options in the event pt does not make enough progress. SW informed on short term rehab as an alternative option. SW stressed the importance of family education to allow a more informed decision. Husband is hopeful for progress, and that renovation to downstairs level of home is completed in hopes that pt can stay on ground level. SW informed there will continue to be weekly updates.   Loralee Pacas, MSW, [place] Office: [phone] Cell: [phone] Fax: [phone]

## 2020-07-14 NOTE — Progress Notes (Signed)
[place] PHYSICAL MEDICINE & REHABILITATION PROGRESS NOTE   Subjective/Complaints: Feels constipated. Often has urge to void but can't except for smears. Seems to be voiding ok  ROS: Patient denies fever, rash, sore throat, blurred vision, nausea, vomiting, diarrhea, cough, shortness of breath or chest pain, joint or back pain,  , or mood change.     Objective:   No results found. Recent Labs    07/12/20 1230  WBC 6.8  HGB 11.6*  HCT 34.4*  PLT 438*   Recent Labs    07/12/20 1230 07/13/20 0608  NA 128* 132*  K 3.5 3.5  CL 95* 98  CO2 20* 22  GLUCOSE 124* 110*  BUN 14 10  CREATININE 0.70 0.67  CALCIUM 8.9 9.4    Intake/Output Summary (Last 24 hours) at 07/14/2020 0803 Last data filed at 07/13/2020 2342 Gross per 24 hour  Intake 340 ml  Output --  Net 340 ml     Physical Exam: Vital Signs Blood pressure 99/67, pulse 63, temperature 98.4 F (36.9 C), resp. rate 20, height 5\' 4"  (1.626 m), weight 74.8 kg, last menstrual period 11/14/2003, SpO2 97 %. Constitutional: No distress . Vital signs reviewed. HEENT: EOMI, oral membranes moist Neck: supple Cardiovascular: RRR without murmur. No JVD    Respiratory/Chest: CTA Bilaterally without wheezes or rales. Normal effort    GI/Abdomen: BS +, non-tender, non-distended Ext: no clubbing, cyanosis, or edema Psych: pleasant and cooperative Musculoskeletal:   -tended along lower sacrum, no bruising or swelling seen.   Skin:    Crani incisions CDI bilaterally Neurological:     Comments: alert, oriented to person, place. Remains verbose and tangential. Appears less distracted.  Light touch intact in all 4 extremities- moves all 4's.  Motor: 4+/5 in UE. LE 3-/5 prox to 4-/5 distally with pain inhibition in both feet still present       Assessment/Plan: 1. Functional deficits secondary to traumatic subdural hematoma which require 3+ hours per day of interdisciplinary therapy in a comprehensive inpatient rehab  setting.  Physiatrist is providing close team supervision and 24 hour management of active medical problems listed below.  Physiatrist and rehab team continue to assess barriers to discharge/monitor patient progress toward functional and medical goals  Care Tool:  Bathing  Bathing activity did not occur: Safety/medical concerns (Per MD orders )           Bathing assist       Upper Body Dressing/Undressing Upper body dressing   What is the patient wearing?: Button up shirt    Upper body assist Assist Level: Maximal Assistance - Patient 25 - 49%    Lower Body Dressing/Undressing Lower body dressing      What is the patient wearing?: Underwear/pull up, Incontinence brief     Lower body assist Assist for lower body dressing: Total Assistance - Patient < 25%     Toileting Toileting    Toileting assist Assist for toileting: Total Assistance - Patient < 25%     Transfers Chair/bed transfer  Transfers assist     Chair/bed transfer assist level: Moderate Assistance - Patient 50 - 74%     Locomotion Ambulation   Ambulation assist   Ambulation activity did not occur: Safety/medical concerns (symptomatic hypotension)          Walk 10 feet activity   Assist  Walk 10 feet activity did not occur: Safety/medical concerns        Walk 50 feet activity   Assist Walk 50 feet with  2 turns activity did not occur: Safety/medical concerns         Walk 150 feet activity   Assist Walk 150 feet activity did not occur: Safety/medical concerns         Walk 10 feet on uneven surface  activity   Assist Walk 10 feet on uneven surfaces activity did not occur: Safety/medical concerns         Wheelchair     Assist Will patient use wheelchair at discharge?: No   Wheelchair activity did not occur: Safety/medical concerns         Wheelchair 50 feet with 2 turns activity    Assist    Wheelchair 50 feet with 2 turns activity did not occur:  Safety/medical concerns       Wheelchair 150 feet activity     Assist  Wheelchair 150 feet activity did not occur: Safety/medical concerns       Blood pressure 99/67, pulse 63, temperature 98.4 F (36.9 C), resp. rate 20, height 5\' 4"  (1.626 m), weight 74.8 kg, last menstrual period 11/14/2003, SpO2 97 %.  Medical Problem List and Plan: 1.  Decreased functional ability with gait abnormality secondary to large subacute subdural hematoma bilaterally with significant mass-effect.  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             -needs SLP for poor focus and tangential behavior             -patient may shower--dry head well immediately after shower             -ELOS/Goals: 08/04/20- goals Supervision to min A  -Continue CIR  -lethargy: improved. Likely d/t UTI 2.  Antithrombotics: -DVT/anticoagulation: Subcutaneous heparin.  vascular study 8/27 shows age indeterminate clot left posterior tib vein. 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   -repeat u/s next week             -antiplatelet therapy: Aspirin 81 mg twice daily 3. Pain Management/headaches: Hydrocodone scheduled 10mg  q6H since she forgets to ask for pain medications, Topamax 75 mg nightly, Depakote 250 mg daily and 750 mg nightly.   Kpad for low back/sacral pain 4. Mood: Klonopin 0.5 mg nightly as needed.   -remains motivated. Occasionally anxious             -antipsychotic agents: N/A 5. Neuropsych: This patient is capable of making decisions on her own behalf.  -beginning trial of ritalin to improve concentration 6. Skin/Wound Care: Routine skin checks 7. Fluids/Electrolytes/Nutrition: pt hyponatremic on most recent labs  -sodium up to 132 8/31  -f/u Friday 8.  Subdural hemorrhage extending throughout the length of the visualized spine mainly dorsal and most notable in the lower thoracic segments  and at the L5-S1 level.  Unclear origin  - continue consvt care  9.  Seizure prophylaxis.  Keppra 500 mg twice daily 10.  Hypertension.  Cozaar 100 mg daily.  Monitor with increased mobility. Well controlled  8/30 bp better, continue cozaar 75mg   -encourage fluids 11.  Hyperlipidemia.  Lipitor 12.  Neurogenic bladder.  Urecholine 10 mg 3 times daily.  Check PVR x3- has been retaining- might need Flomax  8/30 100k E coli in urine S to ctx/cephalexin   -9/1: keflex  for 5 days 13.  History of alcohol use. 1-2 drinks/day per pt-  Provide counseling. 14.  Left nondisplaced fracture of left lateral malleolus and nondisplaced fracture of  lateral malleolus right fibula sprain of tibiofibular ligament/right ankle after recent fall 06/14/2020.     - WBAT bilateral lower extremities.    -Cam boot on left and lace up ankle brace on right. 15. Neurogenic bowel- incontinent  -should improve as mobility and cognitively she improves       LOS: 6 days A FACE TO FACE EVALUATION WAS PERFORMED  Amanda Collier 07/14/2020, 8:03 AM

## 2020-07-14 NOTE — Progress Notes (Signed)
   Patient Details  Name: Amanda Collier MRN: [id number] Date of Birth: 03-03-1946  Today's Date: 07/14/2020  Hospital Problems: Principal Problem:   Traumatic subdural hematoma [facility]  Past Medical History:  Past Medical History:  Diagnosis Date  . Headache syndrome 12/18/2016  . High blood pressure   . Hypertension    followed by Dr Gillian Shields medications  . Leg swelling    Hx of   Past Surgical History:  Past Surgical History:  Procedure Laterality Date  . CRANIOTOMY Bilateral 06/24/2020   Procedure: Bilateral craniotomy for evacuation of subdural hematoma;  Surgeon: Ashok Pall, MD;  Location: [place];  Service: Neurosurgery;  Laterality: Bilateral;  . CRANIOTOMY Right 06/26/2020   Procedure: CRANIOTOMY HEMATOMA EVACUATION SUBDURAL;  Surgeon: Ashok Pall, MD;  Location: [place];  Service: Neurosurgery;  Laterality: Right;  . NO PAST SURGERIES     Social History:  reports that she has never smoked. She has never used smokeless tobacco. She reports current alcohol use of about 7.0 standard drinks of alcohol per week. She reports that she does not use drugs.  Family / Support Systems Marital Status: Married How Long?: 53 years Patient Roles: Spouse Spouse/Significant Other: Dellis Filbert (husband): [phone] Children: 2 children; 1 local (other child in [place]) Other Supports: None reported Anticipated Caregiver: Husband Ability/Limitations of Caregiver: None reported Caregiver Availability: 24/7 Family Dynamics: Pt lives with husband. They both travel to [place] in the summer and return to [facility] during winter.  Social History Preferred language: English Religion: Catholic Cultural Background: Pt worked with Technical sales engineer. Education: college grad Read: Yes Write: Yes Employment Status: Retired Public relations account executive Issues: Denies Guardian/Conservator: N/A   Abuse/Neglect Abuse/Neglect Assessment Can Be Completed:  Yes Physical Abuse: Denies Verbal Abuse: Denies Sexual Abuse: Denies Exploitation of patient/patient's resources: Denies Self-Neglect: Denies  Emotional Status Pt's affect, behavior and adjustment status: Pt in good spirits at time of visit Recent Psychosocial Issues: Denies Psychiatric History: Dr. Toy Care- sees for anxiety for last 3 years (counseling and med managment) Substance Abuse History: Denies; admits to wine on occassion.  Patient / Family Perceptions, Expectations & Goals Pt/Family understanding of illness & functional limitations: Pt and husband have general understanding of care needs Premorbid pt/family roles/activities: Independent with use of AD at times (RW) Anticipated changes in roles/activities/participation: Assistance with ADLs/IADLs  Education officer, environmental Agencies: None Premorbid Home Care/DME Agencies: None Transportation available at discharge: Husband Resource referrals recommended: Neuropsychology  Discharge Planning Living Arrangements: Spouse/significant other Support Systems: Spouse/significant other Type of Residence: Private residence Insurance Resources: Chartered certified accountant Resources: Social Security, [place] Referred: No Living Expenses: Own Money Management: Patient, Spouse Does the patient have any problems obtaining your medications?: No Care Coordinator Barriers to Discharge: Decreased caregiver support, Lack of/limited family support Care Coordinator Anticipated Follow Up Needs: HH/OP  Clinical Impression SW met with pt and pt husband in room to introduce self, explain role, and discuss discharge process. Pt HCPOA is her husband Merry Proud. Pt is not a English as a second language teacher. Pt husband is a English as a second language teacher but does not use any VA benefits. DME: Cornelious Bryant 07/14/2020, 4:07 PM

## 2020-07-14 NOTE — Progress Notes (Signed)
Occupational Therapy Session Note  Patient Details  Name: Amanda Collier MRN: [id number] Date of Birth: [birth year]  Today's Date: 07/14/2020 OT Individual Time: 1021-1173 OT Individual Time Calculation (min): 43 min    Short Term Goals: Week 1:  OT Short Term Goal 1 (Week 1): Pt will don shirt wiht MIN A OT Short Term Goal 2 (Week 1): Pt will thread 1LE into pants OT Short Term Goal 3 (Week 1): PT will sit to stnad with MOD A of 1 wiht LRAD in prep for LB dressing OT Short Term Goal 4 (Week 1): Pt will sequence oral care with no VC OT Short Term Goal 5 (Week 1): Pt will initate grooming with no VC  Skilled Therapeutic Interventions/Progress Updates:    1;1. Pt received in bed agreeable to OT. Pt with "a little" pain in tailbone. Pt provided with ed on log rolling for LBP management. Pt completes self feeding with min VC for attention to eating meal instead of talking. Pt sits EOB with set up for sitting balance while OT educates on BP management, potential vestibular issues and TBI recovery. Vitals assessed at EOB 117/79 EOB pulse 105 O2 97. Door opened for working on selective attention to meal with busy hallway. Pt states, "I know Im a chatty cathy but I just miss talking to my friends/socializing." Edu re group therapy opportunities and pt verbalized wanting to participate. 106/71 BP after MOD A stand pivot transfer with VC for steppign around to w/c and VC for anterior weight shift. Exited session with ptseated in TIS tilted back, exit alarm on and call light in readh  Therapy Documentation Precautions:  Precautions Precautions: Fall Precaution Comments: drains removed Required Braces or Orthoses: Other Brace Other Brace: L cam boot; R lace up ankle brace; has sneaker for R foot Restrictions Weight Bearing Restrictions: Yes RLE Weight Bearing: Weight bearing as tolerated LLE Weight Bearing: Weight bearing as tolerated Other Position/Activity Restrictions: WBAT with  braces General:   Vital Signs:   Pain:   ADL: ADL Grooming: Minimal assistance Where Assessed-Grooming: Sitting at sink Upper Body Bathing: Unable to assess Lower Body Bathing: Unable to assess Upper Body Dressing: Maximal assistance Where Assessed-Upper Body Dressing: Edge of bed Lower Body Dressing: Maximal assistance Where Assessed-Lower Body Dressing: Edge of bed, Bed level Toileting: Maximal assistance Where Assessed-Toileting: Bedside Commode Toilet Transfer: Dependent (stedy) Toilet Transfer Equipment: Drop arm bedside commode Vision   Perception    Praxis   Exercises:   Other Treatments:     Therapy/Group: Individual Therapy  Tonny Branch 07/14/2020, 1:15 PM

## 2020-07-14 NOTE — Progress Notes (Signed)
Occupational Therapy Session Note  Patient Details  Name: Amanda Collier MRN: [id number] Date of Birth: [birth year]  Today's Date: 07/14/2020 OT Individual Time: 1000-1100 OT Individual Time Calculation (min): 60 min    Short Term Goals: Week 1:  OT Short Term Goal 1 (Week 1): Pt will don shirt wiht MIN A OT Short Term Goal 2 (Week 1): Pt will thread 1LE into pants OT Short Term Goal 3 (Week 1): PT will sit to stnad with MOD A of 1 wiht LRAD in prep for LB dressing OT Short Term Goal 4 (Week 1): Pt will sequence oral care with no VC OT Short Term Goal 5 (Week 1): Pt will initate grooming with no VC  Skilled Therapeutic Interventions/Progress Updates:    Pt received supine, in good spirits and eager to participate. Pt completed bed mobility to EOB with min A to scoot hips forward and maintain midline orientation. Pt declined full ADLs but washed face and requested to don pants. Pt able to thread RLE through pants sitting EOB but required assistance to thread LLE. Thigh high teds donned as well as cam boot and ankle brace with total A. Pt completed stand pivot transfer with mod A to the TIS w/c with pt limited by anxiety, requiring mod-max cueing for sequencing. Pt given rest break with chair reclined, BP obtained. Pt then completed stand pivot transfer to the [facility] over the toilet with min A with heavy use of the grab bars. Pt sat for several minutes and attempted to void BM. Pt required max A for peri hygiene in standing with use of the stedy. Min A to power up in stedy. Via stedy pt was transferred back to bed. She was left supine with all needs met, bed alarm set.   BP EOB: 130/75 BP in TIS: 107/66 BP on toilet: 108/76  Therapy Documentation Precautions:  Precautions Precautions: Fall Precaution Comments: drains removed Required Braces or Orthoses: Other Brace Other Brace: L cam boot; R lace up ankle brace; has sneaker for R foot Restrictions Weight Bearing Restrictions: Yes RLE  Weight Bearing: Weight bearing as tolerated LLE Weight Bearing: Weight bearing as tolerated Other Position/Activity Restrictions: WBAT with braces   Therapy/Group: Individual Therapy  Curtis Sites 07/14/2020, 6:30 AM

## 2020-07-14 NOTE — Progress Notes (Signed)
Physical Therapy Session Note  Patient Details  Name: Amanda Collier MRN: [id number] Date of Birth: [birth year]  Today's Date: 07/14/2020 PT Individual Time: 0623-7628 and 1530-1555 PT Individual Time Calculation (min): 45 min and 35 min  Short Term Goals: Week 1:  PT Short Term Goal 1 (Week 1): Pt will perform bed mobility with modA PT Short Term Goal 2 (Week 1): Pt will maintain sitting EOB >5 minutes with CGA PT Short Term Goal 3 (Week 1): Pt will perform bed<>chair transfer with modA and LRAD PT Short Term Goal 4 (Week 1): Pt will initiate pre-gait and standing activities with modA and LRAD  Skilled Therapeutic Interventions/Progress Updates:     Session 1: Patient in TIS w/c upon PT arrival. Patient alert and agreeable to PT session. Patient denied pain during session. Patient reported feeling that her heart was racing, stated RN aware and said her pulse with WNL. Vitals: BP 115/70, HR 102. Patient requesting to get back to bed due to feeling fatigued from previous therapy sessions and poor sleep last night. Reports that she has had frequent unsuccessful trips to the bathroom.   Therapeutic Activity: Bed Mobility: R ankle brace and L CAM boot doffed with total A EOB due to patient fatigue. Patient performed sit-supine with min-mod A for LE manamgnet and for scooting sideways and up in the bed. Provided verbal cues for log roll technique and use of LEs to move in the bed. Transfers: Patient performed sit to/from stand with min A and transferred to the bed taking small shuffling steps with poor initiation and sitting before completing the turn requiring mod A for balance and safety. Provided verbal cues for initiation and sequencing throughout.  Patient required increased time with all mobility due to delayed initiation and decreased attention. Continued to be verbose throughout session requiring intermittent redirection to attend to task. Attempted to initiate bed level LE exercises.  Patient reported increased fatigue and asking to rest. Patient agreeable to attempt to finish session after 30-45 min rest break.   Patient in bed at end of session with breaks locked, bed alarm set, and all needs within reach.   Session 2: Patient in bed on the bed pan attempting to have a BM upon PT return. Patient alert and agreeable to PT session, asking to try the [facility]. Patient reported un-rated sacral and rectal pain during session, RN made aware. PT provided repositioning, rest breaks, and distraction as pain interventions throughout session.   Therapeutic Activity: Bed Mobility: Patient performed rolling R for peri-care with total A. Patient was unsuccessful with BM on the bed pan. She then performed supine to/from sit with min A for LE and trunk management with use of bed rail in a flat bed. Patient able to recall log roll technique this session. R ankle brace and L CAM boot donned/doffed EOB with total A prior to and following mobility respectively. Transfers: Patient performed stand pivot bed<>BSC with mod A with less complete stand this session due to fatigue. Provided verbal cues for forward weight shift, hand placement, initiation of stepping, and completion of turn prior to sitting for safety. Patient was unsuccessful with BM on BSC. Required total A for peri-care and LB clothing management during toileting. Applied barrier cream for improved rectal pain. Noted increased swelling while applying cream, RN made aware.   Patient in bed at end of session with breaks locked, bed alarm set, and all needs within reach.    Therapy Documentation Precautions:  Precautions Precautions: Fall Precaution Comments:  drains removed Required Braces or Orthoses: Other Brace Other Brace: L cam boot; R lace up ankle brace; has sneaker for R foot Restrictions Weight Bearing Restrictions: Yes RLE Weight Bearing: Weight bearing as tolerated LLE Weight Bearing: Weight bearing as tolerated Other  Position/Activity Restrictions: WBAT with braces   Therapy/Group: Individual Therapy  Chrstopher Malenfant L Tarl Cephas PT, DPT  07/14/2020, 3:16 PM

## 2020-07-14 NOTE — Progress Notes (Signed)
Pt calling staff to the room requesting to be changed multiple times. The pts brief has been dry and not soiled. Pt appears to be anxious and unable to sleep. PRN medications given earlier in the shift. Pt encouraged to turn lights and tv off, and try to relax so she can sleep.

## 2020-07-14 NOTE — Progress Notes (Signed)
Speech Language Pathology Daily Session Note  Patient Details  Name: Amanda Collier MRN: [id number] Date of Birth: 27-Apr-1946  Today's Date: 07/14/2020 SLP Individual Time: 0730-0800 SLP Individual Time Calculation (min): 30 min  Short Term Goals: Week 1: SLP Short Term Goal 1 (Week 1): Patient will utilize speech intelligibility strategies at the conversation levle with Min A verbal cues. SLP Short Term Goal 2 (Week 1): Patient will demonstrate functional problem solving for mildly complex tasks with Mod A verbal cues. SLP Short Term Goal 3 (Week 1): Patient will recall new, daily information with Mod A verbal and visual cues. SLP Short Term Goal 4 (Week 1): Patient will demonstrate sustained attention to functional tasks for 30 minutes with Mod verbal cues for redirection.  Skilled Therapeutic Interventions: Skilled treatment session focused on cognitive goals. SLP facilitated session by providing more than a reasonable amount of time and overall Min-Mod A verbal cues for sustained attention to a complex medication management task. Due to decreased attention, patient only able to complete organizing 2 pills by end of session. Recommend to complete task during next session. Patient left upright in bed with alarm on and all needs within reach. Continue with current plan of care.      Pain No/Denies Pain   Therapy/Group: Individual Therapy  Margert Edsall 07/14/2020, 3:16 PM

## 2020-07-15 ENCOUNTER — Inpatient Hospital Stay (HOSPITAL_COMMUNITY): Payer: Medicare Other

## 2020-07-15 ENCOUNTER — Inpatient Hospital Stay (HOSPITAL_COMMUNITY): Payer: Medicare Other | Admitting: Speech Pathology

## 2020-07-15 MED ORDER — BETHANECHOL CHLORIDE 25 MG PO TABS
25.0000 mg | ORAL_TABLET | Freq: Three times a day (TID) | ORAL | Status: DC
  Administered 2020-07-15 – 2020-07-30 (×44): 25 mg via ORAL
  Filled 2020-07-15 (×45): qty 1

## 2020-07-15 MED ORDER — OXYCODONE HCL 5 MG PO TABS
5.0000 mg | ORAL_TABLET | Freq: Two times a day (BID) | ORAL | Status: DC
  Administered 2020-07-15 – 2020-07-28 (×25): 5 mg via ORAL
  Filled 2020-07-15 (×26): qty 1

## 2020-07-15 MED ORDER — CLONAZEPAM 0.5 MG PO TABS
0.5000 mg | ORAL_TABLET | Freq: Two times a day (BID) | ORAL | Status: DC | PRN
  Administered 2020-07-16 – 2020-07-21 (×6): 0.5 mg via ORAL
  Filled 2020-07-15 (×6): qty 1

## 2020-07-15 NOTE — Progress Notes (Signed)
Physical Therapy Weekly Progress Note  Patient Details  Name: Amanda Collier MRN: [id number] Date of Birth: Jul 19, 1946  Beginning of progress report period: July 09, 2020 End of progress report period: July 15, 2020  Today's Date: 07/15/2020 PT Individual Time: 1300-1405 PT Individual Time Calculation (min): 65 min   Patient has met 4 of 4 short term goals.  Patient has made steady progress this week. She has been limited by intermittent lethargy, labile BP, frequent urge for BM, and dizziness throughout the week. She currently requires min-mod A for bed mobility with use of bed rails, min-mod A for squat pivot and stand pivot transfers, is tolerating sitting OOB ~30 min before fatigue, sitting balance EOB with supervision >25 min, gait 8 feet with L rail and R HHA and w/c follow for safety, see gait details below.   Patient continues to demonstrate the following deficits muscle weakness and muscle joint tightness, decreased cardiorespiratoy endurance, impaired timing and sequencing, unbalanced muscle activation, decreased coordination and decreased motor planning, decreased initiation, decreased attention and decreased awareness, peripheral and decreased sitting balance, decreased standing balance, decreased postural control and decreased balance strategies and therefore will continue to benefit from skilled PT intervention to increase functional independence with mobility.  Patient progressing toward long term goals..  Continue plan of care.  PT Short Term Goals Week 1:  PT Short Term Goal 1 (Week 1): Pt will perform bed mobility with modA PT Short Term Goal 1 - Progress (Week 1): Met PT Short Term Goal 2 (Week 1): Pt will maintain sitting EOB >5 minutes with CGA PT Short Term Goal 2 - Progress (Week 1): Met PT Short Term Goal 3 (Week 1): Pt will perform bed<>chair transfer with modA and LRAD PT Short Term Goal 3 - Progress (Week 1): Met PT Short Term Goal 4 (Week 1): Pt will  initiate pre-gait and standing activities with modA and LRAD PT Short Term Goal 4 - Progress (Week 1): Met Week 2:  PT Short Term Goal 1 (Week 2): Patient will perform bed mobility with min A consitently without use of hospital bed features. PT Short Term Goal 2 (Week 2): Patient will tolerate sitting OOB >1 hour each day. PT Short Term Goal 3 (Week 2): Patient will perform basic transfers with min A using LRAD. PT Short Term Goal 4 (Week 2): Patient will ambulate >30 feet using LRAD with min A.  Skilled Therapeutic Interventions/Progress Updates:     Patient in bed finishing lunch upon PT arrival. Patient alert and agreeable to PT session. Patient reported 6/10 sacral pain during session, RN provided pain meds prior to session. PT provided repositioning, rest breaks, and distraction as pain interventions throughout session. Patient reported dizziness x1 during session, BP 145/93. Also reports feeling anxiety about BM, mobility, and falls RN made aware.   Therapeutic Activity: Bed Mobility: Patient performed supine to/from sit with supervision with use of bed rail. Provided verbal cues for log roll technique to manage back pain and setting bottom elbow to push up. Performed scooting EOB x5 due to minimal movement initially, provided cues for reciprocal scooting with improved movement. Donned/doffed R ankle brace and shoe and L CAM boot with total A with patient sitting EOB with supervision for sitting balance. Transfers: Patient performed stand pivot bed<>w/c with min A to stand and facilitate stepping then mod A to complete pivot and control descent. Provided cues for sequencing and initiation of stepping throughout due to decreased motor planning and patient's fear of falling. Patient  performed sit to/from stand x4 using a L rail and R HHA with min A. Provided verbal cues for scooting forward, forward weight shift, increased knee and hip extension to stand, and reaching back for the arm rest to  control descent.  Pre-Gait and Gait Training:  Patient performed lateral weight shift x20 holding L rail and R HHA in front of a mirror for visual feedback and increased attention. Provided facilitation for weight shifts and cues for lateral hip stability in weight bearing limb. She then progressed to marching x20 with the same cues as above. Patient ambulated 8 feet x2 using L rail and R HHA with min-mod A and a w/c follow for safety. Ambulated with shuffling gait, decreased step length and height R>L, increased R posterior pelvic rotation, and decreased awareness of L UE placement. Provided verbal cues for sequencing, facilitation for weight shifts, increased R step length and L weight shift, and symmetrical pelvic positioning in the mirror. Noted much improved weight shift, step length, and sequencing on second trial follow PT demonstration for corrections and feedback from first trial. Provided positive reinforcement throughout with patient very receptive to this feedback.   Wheelchair Mobility:  Patient was transported in the w/c with total A throughout session for energy conservation and time management.  Patient in bed at end of session with breaks locked, bed alarm set, and all needs within reach.    Therapy Documentation Precautions:  Precautions Precautions: Fall Precaution Comments: drains removed Required Braces or Orthoses: Other Brace Other Brace: L cam boot; R lace up ankle brace; has sneaker for R foot Restrictions Weight Bearing Restrictions: Yes RLE Weight Bearing: Weight bearing as tolerated LLE Weight Bearing: Weight bearing as tolerated Other Position/Activity Restrictions: WBAT with braces  Therapy/Group: Individual Therapy  Tabor Denham L Marjarie Irion PT, DPT  07/15/2020, 4:50 PM

## 2020-07-15 NOTE — Progress Notes (Signed)
Staples removed with no drainage. Incision still approximated. Incision site cleaned with minimal scabbed areas.  [place]

## 2020-07-15 NOTE — Progress Notes (Signed)
[place] PHYSICAL MEDICINE & REHABILITATION PROGRESS NOTE   Subjective/Complaints: Still no bm despite interventions yesterday. Minimal gas. Having low back/sacral pain  ROS: Patient denies fever, rash, sore throat, blurred vision, nausea, vomiting, diarrhea, cough, shortness of breath or chest pain,   headache, or mood change.    Objective:   No results found. Recent Labs    07/12/20 1230  WBC 6.8  HGB 11.6*  HCT 34.4*  PLT 438*   Recent Labs    07/12/20 1230 07/13/20 0608  NA 128* 132*  K 3.5 3.5  CL 95* 98  CO2 20* 22  GLUCOSE 124* 110*  BUN 14 10  CREATININE 0.70 0.67  CALCIUM 8.9 9.4    Intake/Output Summary (Last 24 hours) at 07/15/2020 1108 Last data filed at 07/15/2020 0900 Gross per 24 hour  Intake 600 ml  Output 900 ml  Net -300 ml     Physical Exam: Vital Signs Blood pressure 128/77, pulse 76, temperature 98.5 F (36.9 C), resp. rate 18, height 5\' 4"  (1.626 m), weight 74.8 kg, last menstrual period 11/14/2003, SpO2 95 %. Constitutional: No distress . Vital signs reviewed. HEENT: EOMI, oral membranes moist Neck: supple Cardiovascular: RRR without murmur. No JVD    Respiratory/Chest: CTA Bilaterally without wheezes or rales. Normal effort    GI/Abdomen: BS +, non-tender, non-distended Ext: no clubbing, cyanosis, or edema Psych: pleasant and cooperative Musculoskeletal:   -pain along low back into sacrum, no bruising or swelling seen.   Skin:    Crani incisions CDI bilaterally Neurological:     Comments: alert, oriented to person, place. Remains verbose but less tangential. Appears less distracted.  Light touch intact in all 4 extremities- moves all 4's.  Motor: 4+/5 in UE. LE 3-/5 prox to 4-/5 distally with ongoing pain inhibition in both legs still present       Assessment/Plan: 1. Functional deficits secondary to traumatic subdural hematoma which require 3+ hours per day of interdisciplinary therapy in a comprehensive inpatient rehab  setting.  Physiatrist is providing close team supervision and 24 hour management of active medical problems listed below.  Physiatrist and rehab team continue to assess barriers to discharge/monitor patient progress toward functional and medical goals  Care Tool:  Bathing  Bathing activity did not occur: Safety/medical concerns (Per MD orders )           Bathing assist       Upper Body Dressing/Undressing Upper body dressing   What is the patient wearing?: Button up shirt    Upper body assist Assist Level: Maximal Assistance - Patient 25 - 49%    Lower Body Dressing/Undressing Lower body dressing      What is the patient wearing?: Underwear/pull up, Incontinence brief     Lower body assist Assist for lower body dressing: Total Assistance - Patient < 25%     Toileting Toileting    Toileting assist Assist for toileting: Total Assistance - Patient < 25%     Transfers Chair/bed transfer  Transfers assist     Chair/bed transfer assist level: Moderate Assistance - Patient 50 - 74%     Locomotion Ambulation   Ambulation assist   Ambulation activity did not occur: Safety/medical concerns (symptomatic hypotension)          Walk 10 feet activity   Assist  Walk 10 feet activity did not occur: Safety/medical concerns        Walk 50 feet activity   Assist Walk 50 feet with 2 turns activity did  not occur: Safety/medical concerns         Walk 150 feet activity   Assist Walk 150 feet activity did not occur: Safety/medical concerns         Walk 10 feet on uneven surface  activity   Assist Walk 10 feet on uneven surfaces activity did not occur: Safety/medical concerns         Wheelchair     Assist Will patient use wheelchair at discharge?: No   Wheelchair activity did not occur: Safety/medical concerns         Wheelchair 50 feet with 2 turns activity    Assist    Wheelchair 50 feet with 2 turns activity did not occur:  Safety/medical concerns       Wheelchair 150 feet activity     Assist  Wheelchair 150 feet activity did not occur: Safety/medical concerns       Blood pressure 128/77, pulse 76, temperature 98.5 F (36.9 C), resp. rate 18, height 5\' 4"  (1.626 m), weight 74.8 kg, last menstrual period 11/14/2003, SpO2 95 %.  Medical Problem List and Plan: 1.  Decreased functional ability with gait abnormality secondary to large subacute subdural hematoma bilaterally with significant mass-effect.  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             -needs SLP for poor focus and tangential behavior             -patient may shower--dry head well immediately after shower             -ELOS/Goals: 08/04/20- goals Supervision to min A  -Continue CIR  -lethargy: improved. Likely d/t UTI 2.  Antithrombotics: -DVT/anticoagulation: Subcutaneous heparin.  vascular study 8/27 shows age indeterminate clot left posterior tib vein. 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   -repeat u/s next week             -antiplatelet therapy: Aspirin 81 mg twice daily 3. Pain Management/headaches: Hydrocodone scheduled 10mg  q6H since she forgets to ask for pain medications, Topamax 75 mg nightly, Depakote 250 mg daily and 750 mg nightly.   -pt has blood tracking all the way to sacrum which is definitely related to her pain c/o  Kpad for low back/sacral pain  -will add scheduled oxy IR at 0700 and 1200 to help with pain during activity 4. Mood: Klonopin 0.5 mg nightly as needed.   -remains motivated. Occasionally anxious             -antipsychotic agents: N/A 5. Neuropsych: This patient is capable of making decisions on her own behalf.  -beginning trial of ritalin to improve concentration 6. Skin/Wound Care: Routine skin checks 7. Fluids/Electrolytes/Nutrition: pt hyponatremic on most recent labs  -sodium  up to 132 8/31  -f/u tomorrow 8.  Subdural hemorrhage extending throughout the length of the visualized spine mainly dorsal and most notable in the lower thoracic segments and at the L5-S1 level.  Unclear origin  - continue consvt care  9.  Seizure prophylaxis.  Keppra 500 mg twice daily 10.  Hypertension.  Cozaar 100 mg daily.  Monitor with increased mobility. Well controlled  8/30 bp better, continue cozaar 75mg   -encourage fluids 11.  Hyperlipidemia.  Lipitor 12.  Neurogenic bladder.  Urecholine 10 mg 3 times daily.  Check PVR x3- has been retaining- might need Flomax  8/30 100k E coli in urine S to ctx/cephalexin   -9/1: keflex  for 5 days  9/2 still retaining. Increase urecholine to 25mg  tid 13.  History of alcohol use. 1-2 drinks/day per pt-  Provide counseling. 14.  Left nondisplaced fracture of left lateral malleolus and nondisplaced fracture of lateral malleolus right fibula sprain of tibiofibular ligament/right ankle after recent fall 06/14/2020.     - WBAT bilateral lower extremities.    -Cam boot on left and lace up ankle brace on right. 15. Neurogenic bowel- incontinent  -should improve as mobility and cognitively she improves       LOS: 7 days A FACE TO FACE EVALUATION WAS PERFORMED  Meredith Staggers 07/15/2020, 11:08 AM

## 2020-07-15 NOTE — Progress Notes (Signed)
Occupational Therapy Session Note  Patient Details  Name: Amanda Collier MRN: [id number] Date of Birth: 09/04/1946  Today's Date: 07/15/2020 OT Individual Time: 1106-1200 OT Individual Time Calculation (min): 54 min    Short Term Goals: Week 1:  OT Short Term Goal 1 (Week 1): Pt will don shirt wiht MIN A OT Short Term Goal 2 (Week 1): Pt will thread 1LE into pants OT Short Term Goal 3 (Week 1): PT will sit to stnad with MOD A of 1 wiht LRAD in prep for LB dressing OT Short Term Goal 4 (Week 1): Pt will sequence oral care with no VC OT Short Term Goal 5 (Week 1): Pt will initate grooming with no VC  Skilled Therapeutic Interventions/Progress Updates:    1;1. Pt declines bathing this date with pain only reported in tailbone with repositioning PRN throughout session. Pt completes supine>sitting EOB with S and VC for reaching L to achieve midline on EOB. Emphasis of session on stand pivot transfer training with anterior weight shifting and holding OT BUE. Pt requires MIN A to rise and min-MOD A for shuffling pivot steps EOB<>w/c<>EOM with rest and vitals assessed after each transfer. Pt nervous about uneven transfer with mat lower than w/c going to mat therefore mat raised to w/c height to ease fear. Pt rested in [address] with chair tilted back fully to relieve pressure off tailbone. Pt returned to room to complete oral care with VC for initiation and continuation of task. Exited session with pt seated in bed, exit alarm on and call light in reach  Therapy Documentation Precautions:  Precautions Precautions: Fall Precaution Comments: drains removed Required Braces or Orthoses: Other Brace Other Brace: L cam boot; R lace up ankle brace; has sneaker for R foot Restrictions Weight Bearing Restrictions: Yes RLE Weight Bearing: Weight bearing as tolerated LLE Weight Bearing: Weight bearing as tolerated Other Position/Activity Restrictions: WBAT with braces General:   Vital Signs: Therapy  Vitals Temp: 98.5 F (36.9 C) Pulse Rate: 76 BP: 128/77 Patient Position (if appropriate): Lying Oxygen Therapy SpO2: 95 % O2 Device: Room Air Pain:   ADL: ADL Grooming: Minimal assistance Where Assessed-Grooming: Sitting at sink Upper Body Bathing: Unable to assess Lower Body Bathing: Unable to assess Upper Body Dressing: Maximal assistance Where Assessed-Upper Body Dressing: Edge of bed Lower Body Dressing: Maximal assistance Where Assessed-Lower Body Dressing: Edge of bed, Bed level Toileting: Maximal assistance Where Assessed-Toileting: Bedside Commode Toilet Transfer: Dependent (stedy) Toilet Transfer Equipment: Drop arm bedside commode Vision   Perception    Praxis   Exercises:   Other Treatments:     Therapy/Group: Individual Therapy  Tonny Branch 07/15/2020, 7:59 AM

## 2020-07-15 NOTE — Progress Notes (Signed)
Physical Therapy Session Note  Patient Details  Name: Amanda Collier MRN: [id number] Date of Birth: 10-Feb-1946  Today's Date: 07/15/2020 PT Individual Time: 0900-0927 PT Individual Time Calculation (min): 27 min   Short Term Goals: Week 1:  PT Short Term Goal 1 (Week 1): Pt will perform bed mobility with modA PT Short Term Goal 1 - Progress (Week 1): Met PT Short Term Goal 2 (Week 1): Pt will maintain sitting EOB >5 minutes with CGA PT Short Term Goal 2 - Progress (Week 1): Met PT Short Term Goal 3 (Week 1): Pt will perform bed<>chair transfer with modA and LRAD PT Short Term Goal 3 - Progress (Week 1): Met PT Short Term Goal 4 (Week 1): Pt will initiate pre-gait and standing activities with modA and LRAD PT Short Term Goal 4 - Progress (Week 1): Progressing toward goal  Skilled Therapeutic Interventions/Progress Updates:   Received pt supine in bed with husband present at bedside, pt agreeable to therapy, and reported minor discomfort along sacrum from lying in bed. Pt verbose throughout session and required min cues for sustained attention. Pt also required increased time for all mobility due to dizziness. Session with emphasis on functional mobility, dressing, generalized strengthening, dynamic sitting balance/coordination, and improved activity tolerance. Doffed non skid socks and donned bilateral ted hose total A and re-donned non-skid socks. Donned pants in supine with mod A and pt rolled to L and R with supervision and required mod A to pull pants over hips. Pt transferred supine<>sitting EOB with min A and cues for logroll technique. Pt reported increased dizziness upon sitting EOB but reported relief with increased time and when focusing on one object. No nystagmus noted. Doffed dirty shirt and donned bra and clean pull over shirt with mod A. Pt reported increased dizziness while getting dressed EOB and frequently closed eyes. Pt applied deodorant with set up assist while sitting EOB. Pt  requested to lie back down and transferred sit<>supine with CGA and required total A to scoot to [facility] to reposition. Pt reported relief in symptoms upon laying down and requested to take a nap. Concluded session with pt supine in bed, needs within reach, and bed alarm on.   Therapy Documentation Precautions:  Precautions Precautions: Fall Precaution Comments: drains removed Required Braces or Orthoses: Other Brace Other Brace: L cam boot; R lace up ankle brace; has sneaker for R foot Restrictions Weight Bearing Restrictions: Yes RLE Weight Bearing: Weight bearing as tolerated LLE Weight Bearing: Weight bearing as tolerated Other Position/Activity Restrictions: WBAT with braces   Therapy/Group: Individual Therapy Leya Paige PT, DPT   07/15/2020, 7:20 AM

## 2020-07-15 NOTE — Progress Notes (Addendum)
Speech Language Pathology Daily Session Note  Patient Details  Name: Amanda Collier MRN: [id number] Date of Birth: 04/17/1946  Today's Date: 07/15/2020 SLP Individual Time: 1400-1500 SLP Individual Time Calculation (min): 60 min  Short Term Goals: Week 1: SLP Short Term Goal 1 (Week 1): Patient will utilize speech intelligibility strategies at the conversation levle with Min A verbal cues. SLP Short Term Goal 2 (Week 1): Patient will demonstrate functional problem solving for mildly complex tasks with Mod A verbal cues. SLP Short Term Goal 3 (Week 1): Patient will recall new, daily information with Mod A verbal and visual cues. SLP Short Term Goal 4 (Week 1): Patient will demonstrate sustained attention to functional tasks for 30 minutes with Mod verbal cues for redirection.  Skilled Therapeutic Interventions:   Patient seen for skilled ST session focusing on complex level problem solving and attention task with medication management and looking up calories in booklet to calculate meal ticket calories. Patient performed best when SLP had back turned to her, as she would become distracted trying to talk to SLP while performing task. She did demonstrate good awareness to errors, such as when she noticed that she had not put a 'pill' in one of the days using a pill box. She spontaneously requested that SLP close the door when she was working on tasks because the hallway noise was distracting her. Overall, patient exhibited good problem solving and seems to be improving with her awareness to deficits including poor attention. She did not recall SLP that worked with her previous date and when SLP told her the name, she did not demonstrate recall, except that she worked on the pill box task and "didn't get much done". Patient continues to benefit from skilled ST treatment to focus on cognitive-linguistic and speech goals.   Pain Pain Assessment Pain Scale: 0-10 Pain Score: 0-No pain  Therapy/Group:  Individual Therapy  Sonia Baller, MA, CCC-SLP 07/15/20 4:24 PM

## 2020-07-16 ENCOUNTER — Inpatient Hospital Stay (HOSPITAL_COMMUNITY): Payer: Medicare Other | Admitting: Speech Pathology

## 2020-07-16 ENCOUNTER — Other Ambulatory Visit: Payer: Self-pay | Admitting: Neurology

## 2020-07-16 ENCOUNTER — Inpatient Hospital Stay (HOSPITAL_COMMUNITY): Payer: Medicare Other

## 2020-07-16 ENCOUNTER — Inpatient Hospital Stay (HOSPITAL_COMMUNITY): Payer: Medicare Other | Admitting: Physical Therapy

## 2020-07-16 LAB — CBC
HCT: 35.7 % — ABNORMAL LOW (ref 36.0–46.0)
Hemoglobin: 12 g/dL (ref 12.0–15.0)
MCH: 31.7 pg (ref 26.0–34.0)
MCHC: 33.6 g/dL (ref 30.0–36.0)
MCV: 94.4 fL (ref 80.0–100.0)
Platelets: 413 10*3/uL — ABNORMAL HIGH (ref 150–400)
RBC: 3.78 MIL/uL — ABNORMAL LOW (ref 3.87–5.11)
RDW: 11.9 % (ref 11.5–15.5)
WBC: 5.9 10*3/uL (ref 4.0–10.5)
nRBC: 0 % (ref 0.0–0.2)

## 2020-07-16 LAB — BASIC METABOLIC PANEL
Anion gap: 13 (ref 5–15)
BUN: 11 mg/dL (ref 8–23)
CO2: 19 mmol/L — ABNORMAL LOW (ref 22–32)
Calcium: 9 mg/dL (ref 8.9–10.3)
Chloride: 92 mmol/L — ABNORMAL LOW (ref 98–111)
Creatinine, Ser: 0.67 mg/dL (ref 0.44–1.00)
GFR calc Af Amer: >60 mL/min (ref >60)
GFR calc non Af Amer: >60 mL/min (ref >60)
Glucose, Bld: 147 mg/dL — ABNORMAL HIGH (ref 70–99)
Potassium: 3.7 mmol/L (ref 3.5–5.1)
Sodium: 124 mmol/L — ABNORMAL LOW (ref 135–145)

## 2020-07-16 LAB — OSMOLALITY: Osmolality: 271 mOsm/kg — ABNORMAL LOW (ref 275–295)

## 2020-07-16 MED ORDER — MAGNESIUM CITRATE PO SOLN
1.0000 | Freq: Once | ORAL | Status: AC
  Administered 2020-07-16: 1 via ORAL
  Filled 2020-07-16: qty 296

## 2020-07-16 MED ORDER — PHENYLEPHRINE-MINERAL OIL-PET 0.25-14-74.9 % RE OINT: 1.0000 "application " | TOPICAL_OINTMENT | Freq: Two times a day (BID) | RECTAL | Status: DC | PRN

## 2020-07-16 MED ORDER — METHYLPHENIDATE HCL 5 MG PO TABS
10.0000 mg | ORAL_TABLET | Freq: Two times a day (BID) | ORAL | Status: DC
  Administered 2020-07-16 – 2020-08-04 (×37): 10 mg via ORAL
  Filled 2020-07-16 (×40): qty 2

## 2020-07-16 MED ORDER — WITCH HAZEL-GLYCERIN EX PADS
MEDICATED_PAD | CUTANEOUS | Status: DC | PRN
  Filled 2020-07-16: qty 100

## 2020-07-16 MED ORDER — PHENYLEPHRINE-MINERAL OIL-PET 0.25-14-74.9 % RE OINT
1.0000 "application " | TOPICAL_OINTMENT | Freq: Two times a day (BID) | RECTAL | Status: DC | PRN
  Filled 2020-07-16: qty 57

## 2020-07-16 NOTE — Progress Notes (Signed)
Patient with frequent episodes of restlessness,and anxiety throughout the shift,verbalizes to writer to make a note for Dr.Swartz because she "has several issues that she need to talk with him about in the morning", Im having difficulty in sleeping and discomfort with my hemorrhoids. Continue to provide support prn Tylenol and Klonopin administered

## 2020-07-16 NOTE — Progress Notes (Signed)
Speech Language Pathology Weekly Progress and Session Note  Patient Details  Name: MIYANA MORDECAI MRN: [id number] Date of Birth: 1946-11-10  Beginning of progress report period: July 09, 2020 End of progress report period: July 16, 2020  Today's Date: 07/16/2020 SLP Individual Time: 9381-0175 SLP Individual Time Calculation (min): 25 min  Short Term Goals: Week 1: SLP Short Term Goal 1 (Week 1): Patient will utilize speech intelligibility strategies at the conversation levle with Min A verbal cues. SLP Short Term Goal 1 - Progress (Week 1): Met SLP Short Term Goal 2 (Week 1): Patient will demonstrate functional problem solving for mildly complex tasks with Mod A verbal cues. SLP Short Term Goal 2 - Progress (Week 1): Met SLP Short Term Goal 3 (Week 1): Patient will recall new, daily information with Mod A verbal and visual cues. SLP Short Term Goal 3 - Progress (Week 1): Met SLP Short Term Goal 4 (Week 1): Patient will demonstrate sustained attention to functional tasks for 30 minutes with Mod verbal cues for redirection. SLP Short Term Goal 4 - Progress (Week 1): Not met    New Short Term Goals: Week 2: SLP Short Term Goal 1 (Week 2): Patient will utilize speech intelligibility strategies at the conversation levle with supervision verbal cues to achieve ~90% intelligibility. SLP Short Term Goal 2 (Week 2): Patient will demonstrate functional problem solving for mildly complex tasks with Min A verbal cues. SLP Short Term Goal 3 (Week 2): Patient will recall new, daily information with Min A verbal and visual cues. SLP Short Term Goal 4 (Week 2): Patient will demonstrate sustained attention to functional tasks for 30 minutes with Mod verbal cues for redirection.  Weekly Progress Updates: Patient has made functional gains and has met 3 of 4 STGs this reporting period. Currently, patient requires overall Mod A verbal cues to complete functional and mildly complex tasks safely in  regards to problem solving and recall but continues to requires overall Max A verbal cues for attention to task due to verbosity. Patient also demonstrates improved speech intelligibility at the conversation level with overall 90-100% intelligibility with min verbal cues. Patient and family education ongoing. Patient would benefit from f/u SLP services to maximize her cognitive functioning and overall functional independence prior to discharge.      Intensity: Minumum of 1-2 x/day, 30 to 90 minutes Frequency: 3 to 5 out of 7 days Duration/Length of Stay: 08/04/20 Treatment/Interventions: Cognitive remediation/compensation;Speech/Language facilitation;Therapeutic Activities;Environmental controls;Cueing hierarchy;Functional tasks;Patient/family education;Internal/external aids   Daily Session  Skilled Therapeutic Interventions: Skilled treatment session focused on cognitive goals. SLP facilitated session by providing overall Min A verbal cues and time constraints with focus on maximizing attention in order to complete her task of self-feeding her breakfast meal. Patient with decreased verbosity and completed her breakfast within the allotted time frame! Patient left upright in bed with alarm on and all needs within reach. Continue with current plan of care.      Pain Discomfort due to constipation   Therapy/Group: Individual Therapy  Ghazal Pevey 07/16/2020, 6:22 AM

## 2020-07-16 NOTE — Progress Notes (Signed)
Patient unable to void after po intake encouraged and several self attempts to void within 8 hours written order Successful in I/O catherization of 450 cc urine, tolerated well, peri care provided, Made as comfortable as possible, call bell placed within reach, bed alarm on, HOB elevated for comfort, monitor and assisted

## 2020-07-16 NOTE — Progress Notes (Signed)
Speech Language Pathology Daily Session Note  Patient Details  Name: LANESHA AZZARO MRN: [id number] Date of Birth: 1946-06-14  Today's Date: 07/16/2020 SLP Individual Time: 1100-1130 SLP Individual Time Calculation (min): 30 min  Short Term Goals: Week 2: SLP Short Term Goal 1 (Week 2): Patient will utilize speech intelligibility strategies at the conversation levle with supervision verbal cues to achieve ~90% intelligibility. SLP Short Term Goal 2 (Week 2): Patient will demonstrate functional problem solving for mildly complex tasks with Min A verbal cues. SLP Short Term Goal 3 (Week 2): Patient will recall new, daily information with Min A verbal and visual cues. SLP Short Term Goal 4 (Week 2): Patient will demonstrate sustained attention to functional tasks for 30 minutes with Mod verbal cues for redirection.  Skilled Therapeutic Interventions: Skilled treatment session focused on cognitive goals. SLP facilitated session by providing extra time and overall Min A verbal cues for error awareness and problem solving during a complex scheduling task. Patient was also overall Mod I for sustained attention throughout task. Patient left upright in bed with alarm on and all needs within reach. Continue with current plan of care.      Pain Discomfort due to constipation   Therapy/Group: Individual Therapy  Jamorion Gomillion 07/16/2020, 1:13 PM

## 2020-07-16 NOTE — Progress Notes (Addendum)
Amanda Collier PHYSICAL MEDICINE & REHABILITATION PROGRESS NOTE   Subjective/Complaints: Still hasn't moved bowels. Received sorbitol and suppository yesterday. Feels bloated. Also having hemorrhoidal discomfort  ROS: Patient denies fever, rash, sore throat, blurred vision, nausea, vomiting, diarrhea, cough, shortness of breath or chest pain, joint or back pain, headache, or mood change.     Objective:   No results found. Recent Labs    07/16/20 1002  WBC 5.9  HGB 12.0  HCT 35.7*  PLT 413*   Recent Labs    07/16/20 1002  NA 124*  K 3.7  CL 92*  CO2 19*  GLUCOSE 147*  BUN 11  CREATININE 0.67  CALCIUM 9.0    Intake/Output Summary (Last 24 hours) at 07/16/2020 1256 Last data filed at 07/16/2020 0800 Gross per 24 hour  Intake 660 ml  Output 1050 ml  Net -390 ml     Physical Exam: Vital Signs Blood pressure 122/74, pulse 71, temperature 97.9 F (36.6 C), temperature source Oral, resp. rate 16, height 5\' 4"  (1.626 m), weight 74.8 kg, last menstrual period 11/14/2003, SpO2 96 %. Constitutional: No distress . Vital signs reviewed. HEENT: EOMI, oral membranes moist Neck: supple Cardiovascular: RRR without murmur. No JVD    Respiratory/Chest: CTA Bilaterally without wheezes or rales. Normal effort    GI/Abdomen: BS +, non-tender, belly sl distended Ext: no clubbing, cyanosis, or edema Psych: pleasant and cooperative Musculoskeletal:   -pain along low back into sacrum, no bruising or swelling seen.   Skin:    Crani incisions CDI bilaterally Neurological:     Comments: alert, oriented to person, place. Remains verbose but more redirectable. Appears less distracted.  Light touch intact in all 4 extremities- moves all 4's.  Motor: 4+/5 in UE. LE 3-/5 prox to 4-/5 distally with ongoing pain inhibition in both legs still present       Assessment/Plan: 1. Functional deficits secondary to traumatic subdural hematoma which require 3+ hours per day of interdisciplinary therapy  in a comprehensive inpatient rehab setting.  Physiatrist is providing close team supervision and 24 hour management of active medical problems listed below.  Physiatrist and rehab team continue to assess barriers to discharge/monitor patient progress toward functional and medical goals  Care Tool:  Bathing  Bathing activity did not occur: Safety/medical concerns (Per MD orders )           Bathing assist       Upper Body Dressing/Undressing Upper body dressing   What is the patient wearing?: Button up shirt    Upper body assist Assist Level: Maximal Assistance - Patient 25 - 49%    Lower Body Dressing/Undressing Lower body dressing      What is the patient wearing?: Underwear/pull up, Incontinence brief     Lower body assist Assist for lower body dressing: Total Assistance - Patient < 25%     Toileting Toileting    Toileting assist Assist for toileting: Total Assistance - Patient < 25%     Transfers Chair/bed transfer  Transfers assist     Chair/bed transfer assist level: Moderate Assistance - Patient 50 - 74%     Locomotion Ambulation   Ambulation assist   Ambulation activity did not occur: Safety/medical concerns (symptomatic hypotension)  Assist level: Moderate Assistance - Patient 50 - 74% Assistive device: Other (comment) (L rail with HHA on R) Max distance: 8 ft   Walk 10 feet activity   Assist  Walk 10 feet activity did not occur: Safety/medical concerns  Walk 50 feet activity   Assist Walk 50 feet with 2 turns activity did not occur: Safety/medical concerns         Walk 150 feet activity   Assist Walk 150 feet activity did not occur: Safety/medical concerns         Walk 10 feet on uneven surface  activity   Assist Walk 10 feet on uneven surfaces activity did not occur: Safety/medical concerns         Wheelchair     Assist Will patient use wheelchair at discharge?: No   Wheelchair activity did not  occur: Safety/medical concerns         Wheelchair 50 feet with 2 turns activity    Assist    Wheelchair 50 feet with 2 turns activity did not occur: Safety/medical concerns       Wheelchair 150 feet activity     Assist  Wheelchair 150 feet activity did not occur: Safety/medical concerns       Blood pressure 122/74, pulse 71, temperature 97.9 F (36.6 C), temperature source Oral, resp. rate 16, height 5\' 4"  (1.626 m), weight 74.8 kg, last menstrual period 11/14/2003, SpO2 96 %.  Medical Problem List and Plan: 1.  Decreased functional ability with gait abnormality secondary to large subacute subdural hematoma bilaterally with significant mass-effect.  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             -needs SLP for poor focus and tangential behavior             -patient may shower--dry head well immediately after shower             -ELOS/Goals: 08/04/20- goals Supervision to min A  -Continue CIR  -lethargy: improved. Likely d/t UTI 2.  Antithrombotics: -DVT/anticoagulation: Subcutaneous heparin.  vascular study 8/27 shows age indeterminate clot left posterior tib vein. 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   -repeat u/s next week             -antiplatelet therapy: Aspirin 81 mg twice daily 3. Pain Management/headaches: Hydrocodone scheduled 10mg  q6H since she forgets to ask for pain medications, Topamax 75 mg nightly, Depakote 250 mg daily and 750 mg nightly.   -pt has blood tracking all the way to sacrum which is definitely related to her pain c/o  Kpad for low back/sacral pain  -added scheduled oxy IR at 0700 and 1200 to help with pain during activity 4. Mood: Klonopin 0.5 mg nightly as needed.   -remains motivated. Occasionally anxious             -antipsychotic agents: N/A 5. Neuropsych: This patient is capable of making decisions on her own  behalf.  -continue ritalin 10mg  bid for concentration 6. Skin/Wound Care: Routine skin checks 7. Fluids/Electrolytes/Nutrition:    -9/3 sodium back down to 124 today after trending up recently!  -continue to follow serially  -likely centrally driven.   -check urine sodium, add serum osmolality to today's labs  -start 1200 cc FR 8.  Subdural hemorrhage extending throughout the length of the visualized spine mainly dorsal and most notable in the lower thoracic segments and at the L5-S1 level.  Unclear origin  - continue consvt care  9.  Seizure prophylaxis.  Keppra 500 mg twice daily 10.  Hypertension.  Cozaar 100 mg daily.  Monitor with increased mobility. Well controlled  8/30 bp better, continue cozaar 75mg   -encourage fluids  11.  Hyperlipidemia.  Lipitor 12.  Neurogenic bladder.  Urecholine 10 mg 3 times daily.  Check PVR x3- has been retaining- might need Flomax  8/30 100k E coli in urine S to ctx/cephalexin   -9/1: keflex  for 5 days  9/3 some improvement with urecholine 25mg  tid---continue   -OOB to void 13.  History of alcohol use. 1-2 drinks/day per pt-  Provide counseling. 14.  Left nondisplaced fracture of left lateral malleolus and nondisplaced fracture of lateral malleolus right fibula sprain of tibiofibular ligament/right ankle after recent fall 06/14/2020.     - WBAT bilateral lower extremities.    -Cam boot on left and lace up ankle brace on right. 15. Neurogenic bowel- incontinent  -should improve as mobility and cognitively she improves   -mag citrate, SSE today  -hemorrhoidal cream added    LOS: 8 days A FACE TO FACE EVALUATION WAS PERFORMED  Meredith Staggers 07/16/2020, 12:56 PM

## 2020-07-16 NOTE — Progress Notes (Signed)
Physical Therapy Session Note  Patient Details  Name: Amanda Collier MRN: [id number] Date of Birth: 07-22-1946  Today's Date: 07/16/2020 PT Individual Time: 1545-1640 PT Individual Time Calculation (min): 55 min   Short Term Goals: Week 2:  PT Short Term Goal 1 (Week 2): Patient will perform bed mobility with min A consitently without use of hospital bed features. PT Short Term Goal 2 (Week 2): Patient will tolerate sitting OOB >1 hour each day. PT Short Term Goal 3 (Week 2): Patient will perform basic transfers with min A using LRAD. PT Short Term Goal 4 (Week 2): Patient will ambulate >30 feet using LRAD with min A. Week 3:     Skilled Therapeutic Interventions/Progress Updates:   Pt received supine in bed and agreeable to PT. Pt reporting feeling severe pressure like she could have BM. PT applied Cam boot and ankle brace to BLE. Supine>sit transfer with mod assist and cues for use of UE placement as well as LE placement. Stand pivot transfer to the L with mod assist and UE supported on PT. No reports of dizziness or orthostatic s/s when sitting on BSC. Min-supervision assist to maintain sitting balance on BSC x 27minutes to pt to perform large continent BM. RN present to assist with disimpaction, and then pt able to clear additional stool. Per car completed by PT with BUE supported on arms of BSC and min assist to prevent anterior LOB. 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 Sit>supine with mod assist for BLE management. PT doffed CAM boot and ankle brace. Pt left supine in bed with call bell in reach and alarm set.    VS supine: 139/76. HR 82 Sitting 137/105, HR 105.  Sitting following BM. 124/110, HR 122.       Therapy Documentation Precautions:  Precautions Precautions: Fall Precaution Comments: drains removed Required Braces or Orthoses: Other Brace Other Brace: L cam boot; R  lace up ankle brace; has sneaker for R foot Restrictions Weight Bearing Restrictions: Yes RLE Weight Bearing: WBAT with brace LLE Weight Bearing: WBAT with CAM boot.  Other Position/Activity Restrictions: WBAT with braces    Vital Signs: Therapy Vitals Temp: 98.4 F (36.9 C) Pulse Rate: 81 Resp: 18 BP: 131/74 Patient Position (if appropriate): Lying Oxygen Therapy SpO2: 96 % O2 Device: Room Air Pain: denies   Therapy/Group: Individual Therapy  Lorie Phenix 07/16/2020, 4:42 PM

## 2020-07-16 NOTE — Progress Notes (Signed)
Occupational Therapy Weekly Progress Note  Patient Details  Name: Amanda Collier MRN: [id number] Date of Birth: 14-Oct-1946  Beginning of progress report period: July 08, 2020 End of progress report period: July 16, 2020  Today's Date: 07/16/2020 OT Individual Time: 1000-1100 OT Individual Time Calculation (min): 60 min    Patient has met 3 of 5 short term goals.  Pt is making steady progress towards LTGs with improvements made in functional transfers (MOD A), motor planning and cognitive skills improveing pts ability to perform ADLs. Pt is currently min-mod A for UB dressing, MAX A for LB dressing, total A for footwear, and MAX A for toileting. Pt continues to have labile, tailbone pain and cognitive deficits continue impact efficiency of tx.  Patient continues to demonstrate the following deficits: muscle weakness, decreased cardiorespiratoy endurance, impaired timing and sequencing, unbalanced muscle activation, motor apraxia, decreased coordination and decreased motor planning, decreased visual perceptual skills, decreased motor planning, decreased initiation, decreased attention, decreased awareness, decreased problem solving, decreased safety awareness, decreased memory and delayed processing, central origin and decreased sitting balance, decreased standing balance, decreased postural control, decreased balance strategies and difficulty maintaining precautions and therefore will continue to benefit from skilled OT intervention to enhance overall performance with BADL and iADL.  Patient progressing toward long term goals..  Continue plan of care.  OT Short Term Goals Week 1:  OT Short Term Goal 1 (Week 1): Pt will don shirt wiht MIN A OT Short Term Goal 1 - Progress (Week 1): Met OT Short Term Goal 2 (Week 1): Pt will thread 1LE into pants OT Short Term Goal 2 - Progress (Week 1): Progressing toward goal OT Short Term Goal 3 (Week 1): PT will sit to stnad with MOD A of 1 wiht LRAD  in prep for LB dressing OT Short Term Goal 3 - Progress (Week 1): Met OT Short Term Goal 4 (Week 1): Pt will sequence oral care with no VC OT Short Term Goal 4 - Progress (Week 1): Met OT Short Term Goal 5 (Week 1): Pt will initate grooming with no VC OT Short Term Goal 5 - Progress (Week 1): Progressing toward goal Week 2:  OT Short Term Goal 1 (Week 2): Pt will complete oral care with set up OT Short Term Goal 2 (Week 2): Pt will transfer to toilet with MIN A and LRAD OT Short Term Goal 3 (Week 2): Pt will thread 1LE into pants with AE PRN OT Short Term Goal 4 (Week 2): Pt will bathe UB with no VC for sequencing  Skilled Therapeutic Interventions/Progress Updates:    1;1. Pt perseverating on pooping and getting to [name] with pain in tailbone unrated but rest and repositioning provided. Pt supine>sitting EOB with MOD A overall and bed rails. Pt sits EOB with S overall for light UB bathing declining soap and washing anything other than armpits and under breasts. Pt dons shirt and bra with S. Pt requries rest in supine d/c pain prior to attempt to transfer to [facility]. Pt completes MOD A transfer EOB<>BSC with with 2 hand held assist with brief doffed off hips prior to transfer. Pt able to void urine on commode but unable to void bowel d/t decreased tolerance for sitting position on BSC. Pt returned to bed with new brief donned, call light in reach and sitting in chair postiong to improve upright tolerance.   Therapy Documentation Precautions:  Precautions Precautions: Fall Precaution Comments: drains removed Required Braces or Orthoses: Other Brace Other Brace: L  cam boot; R lace up ankle brace; has sneaker for R foot Restrictions Weight Bearing Restrictions: Yes RLE Weight Bearing: Non weight bearing LLE Weight Bearing: Non weight bearing Other Position/Activity Restrictions: WBAT with braces General:   Vital Signs: Therapy Vitals Temp: 97.9 F (36.6 C) Temp Source: Oral Pulse Rate:  71 Resp: 16 BP: 122/74 Patient Position (if appropriate): Lying Oxygen Therapy SpO2: 96 % O2 Device: Room Air Pain:   ADL: ADL Grooming: Minimal assistance Where Assessed-Grooming: Sitting at sink Upper Body Bathing: Unable to assess Lower Body Bathing: Unable to assess Upper Body Dressing: Maximal assistance Where Assessed-Upper Body Dressing: Edge of bed Lower Body Dressing: Maximal assistance Where Assessed-Lower Body Dressing: Edge of bed, Bed level Toileting: Maximal assistance Where Assessed-Toileting: Bedside Commode Toilet Transfer: Dependent (stedy) Toilet Transfer Equipment: Drop arm bedside commode Vision   Perception    Praxis   Exercises:   Other Treatments:     Therapy/Group: Individual Therapy  Tonny Branch 07/16/2020, 6:45 AM

## 2020-07-17 NOTE — Progress Notes (Signed)
[place] PHYSICAL MEDICINE & REHABILITATION PROGRESS NOTE   Subjective/Complaints:   Has hemorrhoid cream and finally pooped- feeling MUCH better.   Tailbone hurts-   Husband is concerned that pt needs f/u xrays of ankle fractures.     ROS:  Pt denies SOB, abd pain, CP, N/V/C/D, and vision changes    Objective:   No results found. Recent Labs    07/16/20 1002  WBC 5.9  HGB 12.0  HCT 35.7*  PLT 413*   Recent Labs    07/16/20 1002  NA 124*  K 3.7  CL 92*  CO2 19*  GLUCOSE 147*  BUN 11  CREATININE 0.67  CALCIUM 9.0    Intake/Output Summary (Last 24 hours) at 07/17/2020 1542 Last data filed at 07/17/2020 0123 Gross per 24 hour  Intake 60 ml  Output 425 ml  Net -365 ml     Physical Exam: Vital Signs Blood pressure 100/66, pulse 81, temperature 97.6 F (36.4 C), resp. rate 18, height 5\' 4"  (1.626 m), weight 74.8 kg, last menstrual period 11/14/2003, SpO2 99 %. Constitutional: No distress . Vital signs reviewed. Head almost completely shaved, laying in bed- bright, NAD HEENT: EOMI, oral membranes moist- staples out of scalp- looks good Neck: supple Cardiovascular: RRR   Respiratory/Chest:  Pt denies SOB, abd pain, CP, N/V/C/D, and vision changes GI/Abdomen: Soft, NT, ND, (+)BS  Ext: no clubbing, cyanosis, or edema Psych: appropriate- forgetful- husband at side Musculoskeletal:   -pain along low back into sacrum, no bruising or swelling seen.   Skin:    Crani incisions CDI bilaterally Neurological:     Comments: alert, oriented to person, place. Remains verbose but more redirectable. Appears less distracted.  Light touch intact in all 4 extremities- moves all 4's.  Motor: 4+/5 in UE. LE 3-/5 prox to 4-/5 distally with ongoing pain inhibition in both legs still present       Assessment/Plan: 1. Functional deficits secondary to traumatic subdural hematoma which require 3+ hours per day of interdisciplinary therapy in a comprehensive inpatient rehab  setting.  Physiatrist is providing close team supervision and 24 hour management of active medical problems listed below.  Physiatrist and rehab team continue to assess barriers to discharge/monitor patient progress toward functional and medical goals  Care Tool:  Bathing  Bathing activity did not occur: Safety/medical concerns (Per MD orders )           Bathing assist       Upper Body Dressing/Undressing Upper body dressing   What is the patient wearing?: Button up shirt    Upper body assist Assist Level: Maximal Assistance - Patient 25 - 49%    Lower Body Dressing/Undressing Lower body dressing      What is the patient wearing?: Underwear/pull up, Incontinence brief     Lower body assist Assist for lower body dressing: Total Assistance - Patient < 25%     Toileting Toileting    Toileting assist Assist for toileting: Total Assistance - Patient < 25%     Transfers Chair/bed transfer  Transfers assist     Chair/bed transfer assist level: Moderate Assistance - Patient 50 - 74%     Locomotion Ambulation   Ambulation assist   Ambulation activity did not occur: Safety/medical concerns (symptomatic hypotension)  Assist level: Moderate Assistance - Patient 50 - 74% Assistive device: Other (comment) (L rail with HHA on R) Max distance: 8 ft   Walk 10 feet activity   Assist  Walk 10 feet activity did  not occur: Safety/medical concerns        Walk 50 feet activity   Assist Walk 50 feet with 2 turns activity did not occur: Safety/medical concerns         Walk 150 feet activity   Assist Walk 150 feet activity did not occur: Safety/medical concerns         Walk 10 feet on uneven surface  activity   Assist Walk 10 feet on uneven surfaces activity did not occur: Safety/medical concerns         Wheelchair     Assist Will patient use wheelchair at discharge?: No   Wheelchair activity did not occur: Safety/medical concerns          Wheelchair 50 feet with 2 turns activity    Assist    Wheelchair 50 feet with 2 turns activity did not occur: Safety/medical concerns       Wheelchair 150 feet activity     Assist  Wheelchair 150 feet activity did not occur: Safety/medical concerns       Blood pressure 100/66, pulse 81, temperature 97.6 F (36.4 C), resp. rate 18, height 5\' 4"  (1.626 m), weight 74.8 kg, last menstrual period 11/14/2003, SpO2 99 %.  Medical Problem List and Plan: 1.  Decreased functional ability with gait abnormality secondary to large subacute subdural hematoma bilaterally with significant mass-effect.  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             -needs SLP for poor focus and tangential behavior             -patient may shower--dry head well immediately after shower             -ELOS/Goals: 08/04/20- goals Supervision to min A  -Continue CIR  -lethargy: improved. Likely d/t UTI 2.  Antithrombotics: -DVT/anticoagulation: Subcutaneous heparin.  vascular study 8/27 shows age indeterminate clot left posterior tib vein. 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   -repeat u/s next week             -antiplatelet therapy: Aspirin 81 mg twice daily 3. Pain Management/headaches: Hydrocodone scheduled 10mg  q6H since she forgets to ask for pain medications, Topamax 75 mg nightly, Depakote 250 mg daily and 750 mg nightly.   -pt has blood tracking all the way to sacrum which is definitely related to her pain c/o  Kpad for low back/sacral pain  -added scheduled oxy IR at 0700 and 1200 to help with pain during activity  9/4- pain adequately controlled, but needs to keep off tailbone when possible per pt 4. Mood: Klonopin 0.5 mg nightly as needed.   -remains motivated. Occasionally anxious             -antipsychotic agents: N/A 5. Neuropsych: This patient is capable of making  decisions on her own behalf.  -continue ritalin 10mg  bid for concentration 6. Skin/Wound Care: Routine skin checks 7. Fluids/Electrolytes/Nutrition:    -9/3 sodium back down to 124 today after trending up recently!  -continue to follow serially  -likely centrally driven.   -check urine sodium, add serum osmolality to today's labs  -start 1200 cc FR  9/4- labs Monday- will double check-  8.  Subdural hemorrhage extending throughout the length of the visualized spine mainly dorsal and most notable in the lower thoracic segments and at the L5-S1 level.  Unclear origin  - continue consvt care  9.  Seizure prophylaxis.  Keppra 500 mg twice daily 10.  Hypertension.  Cozaar 100 mg daily.  Monitor with increased mobility. Well controlled  8/30 bp better, continue cozaar 75mg   -encourage fluids  9/4- BP well controlled- a little soft- con't regimen 11.  Hyperlipidemia.  Lipitor 12.  Neurogenic bladder.  Urecholine 10 mg 3 times daily.  Check PVR x3- has been retaining- might need Flomax  8/30 100k E coli in urine S to ctx/cephalexin   -9/1: keflex  for 5 days  9/3 some improvement with urecholine 25mg  tid---continue   -OOB to void 13.  History of alcohol use. 1-2 drinks/day per pt-  Provide counseling. 14.  Left nondisplaced fracture of left lateral malleolus and nondisplaced fracture of lateral malleolus right fibula sprain of tibiofibular ligament/right ankle after recent fall 06/14/2020.     - WBAT bilateral lower extremities.    -Cam boot on left and lace up ankle brace on right. 15. Neurogenic bowel- incontinent  -should improve as mobility and cognitively she improves   -mag citrate, SSE today  -hemorrhoidal cream added  9/4- had a large BM- feeling better    LOS: 9 days A FACE TO FACE EVALUATION WAS PERFORMED  Amanda Collier 07/17/2020, 3:42 PM

## 2020-07-18 ENCOUNTER — Inpatient Hospital Stay (HOSPITAL_COMMUNITY): Payer: Medicare Other | Admitting: Occupational Therapy

## 2020-07-18 ENCOUNTER — Inpatient Hospital Stay (HOSPITAL_COMMUNITY): Payer: Medicare Other

## 2020-07-18 ENCOUNTER — Inpatient Hospital Stay (HOSPITAL_COMMUNITY): Payer: Medicare Other | Admitting: Speech Pathology

## 2020-07-18 NOTE — Progress Notes (Signed)
Occupational Therapy Session Note  Patient Details  Name: Amanda Collier MRN: [id number] Date of Birth: 1946/06/19  Today's Date: 07/18/2020 OT Individual Time: 0715-0800 OT Individual Time Calculation (min): 45 min    Short Term Goals: Week 1:  OT Short Term Goal 1 (Week 1): Pt will don shirt wiht MIN A OT Short Term Goal 1 - Progress (Week 1): Met OT Short Term Goal 2 (Week 1): Pt will thread 1LE into pants OT Short Term Goal 2 - Progress (Week 1): Progressing toward goal OT Short Term Goal 3 (Week 1): PT will sit to stnad with MOD A of 1 wiht LRAD in prep for LB dressing OT Short Term Goal 3 - Progress (Week 1): Met OT Short Term Goal 4 (Week 1): Pt will sequence oral care with no VC OT Short Term Goal 4 - Progress (Week 1): Met OT Short Term Goal 5 (Week 1): Pt will initate grooming with no VC OT Short Term Goal 5 - Progress (Week 1): Progressing toward goal  Skilled Therapeutic Interventions/Progress Updates:    1;1. Pt received in bed agreeable to OT requesting to don pants and eat breakfast EOB. Pt with decreased sitting balance EOB requeing up to CGA/TC for correction of posural alignment, L trunk elongation/R trunk sortening and L lean in sitting during eating breakfast. T requires VC for sustained attention to eating d/t increased distraction/perseveration to schedule changes. Pt able to thread BLE into pants using reacher with MIN A and mod VC for opening/closing reacher to grab onto pants and pull up to thighs. Total A to don CAM boot and brace. Direct handoff to PT.   Therapy Documentation Precautions:  Precautions Precautions: Fall Precaution Comments: drains removed Required Braces or Orthoses: Other Brace Other Brace: L cam boot; R lace up ankle brace; has sneaker for R foot Restrictions Weight Bearing Restrictions: No RLE Weight Bearing: Non weight bearing LLE Weight Bearing: Non weight bearing Other Position/Activity Restrictions: WBAT with braces General:    Vital Signs: Therapy Vitals Temp: 97.8 F (36.6 C) Pulse Rate: 69 Resp: 18 BP: 105/65 Patient Position (if appropriate): Lying Oxygen Therapy SpO2: 94 % O2 Device: Room Air Pain: Pain Assessment Pain Scale: 0-10 Pain Score: 5  Pain Intervention(s): Medication (See eMAR);Repositioned;Emotional support ADL: ADL Grooming: Minimal assistance Where Assessed-Grooming: Sitting at sink Upper Body Bathing: Unable to assess Lower Body Bathing: Unable to assess Upper Body Dressing: Maximal assistance Where Assessed-Upper Body Dressing: Edge of bed Lower Body Dressing: Maximal assistance Where Assessed-Lower Body Dressing: Edge of bed, Bed level Toileting: Maximal assistance Where Assessed-Toileting: Bedside Commode Toilet Transfer: Dependent (stedy) Toilet Transfer Equipment: Drop arm bedside commode Vision   Perception    Praxis   Exercises:   Other Treatments:     Therapy/Group: Individual Therapy  Tonny Branch 07/18/2020, 7:19 AM

## 2020-07-18 NOTE — Progress Notes (Signed)
Speech Language Pathology Daily Session Note  Patient Details  Name: Amanda Collier MRN: [id number] Date of Birth: 1946-11-11  Today's Date: 07/18/2020 SLP Individual Time: 0935-1030 SLP Individual Time Calculation (min): 55 min  Short Term Goals: Week 2: SLP Short Term Goal 1 (Week 2): Patient will utilize speech intelligibility strategies at the conversation levle with supervision verbal cues to achieve ~90% intelligibility. SLP Short Term Goal 2 (Week 2): Patient will demonstrate functional problem solving for mildly complex tasks with Min A verbal cues. SLP Short Term Goal 3 (Week 2): Patient will recall new, daily information with Min A verbal and visual cues. SLP Short Term Goal 4 (Week 2): Patient will demonstrate sustained attention to functional tasks for 30 minutes with Mod verbal cues for redirection.  Skilled Therapeutic Interventions:   Pt was seen for skilled ST targeting cognitive goals.  SLP facilitated the session with ongoing medication management tasks to address problem solving and attention goals.  Pt needed max cues to recognize and correct errors from previous attempts at med management and mod assist multimodal cues to recognize and correct errors in the moment when organizing new meds into a 3x daily pill box.  Verbosity was not limiting during today's session but pt was hesitant when loading pills into the pill box and would repeatedly check behind herself as well as ask therapist for verification as to the accuracy of her attempts.  Pt did not appear overly distractible to external stimuli but appeared to lose focus to task due to internal distraction and/or task hesitancy as mentioned above and apologized often for amount of time required to finish task.  Question whether anxiety was impacting task performance versus acute challenges in attention.  SLP provided encouragement and reassurance as needed throughout task.  Task is still not completed due to the amount of time  needed for task organization.  Pt was left in bed with bed alarm set and call bell within reach.    Pain Pain Assessment Pain Scale: 0-10 Pain Score: 0-No pain   Therapy/Group: Individual Therapy  Sherene Plancarte, Selinda Orion 07/18/2020, 12:21 PM

## 2020-07-18 NOTE — Progress Notes (Signed)
Physical Therapy Session Note  Patient Details  Name: Amanda Collier MRN: [id number] Date of Birth: [birth year]  Today's Date: 07/18/2020 PT Individual Time: 0800-0905 PT Individual Time Calculation (min): 65 min   Short Term Goals: Week 2:  PT Short Term Goal 1 (Week 2): Patient will perform bed mobility with min A consitently without use of hospital bed features. PT Short Term Goal 2 (Week 2): Patient will tolerate sitting OOB >1 hour each day. PT Short Term Goal 3 (Week 2): Patient will perform basic transfers with min A using LRAD. PT Short Term Goal 4 (Week 2): Patient will ambulate >30 feet using LRAD with min A.  Skilled Therapeutic Interventions/Progress Updates:     Patient sitting EOB with brace, shoe, and CAM boot donned eating breakfast when received from [facility] OT upon PT arrival. Patient alert and agreeable to PT session. Patient reported 3-4/10 sacral pain during session, RN made aware. PT provided repositioning, rest breaks, and distraction as pain interventions throughout session.   Patient asked to finish breakfast sitting EOB. Required supervision for sitting balance, cues for posture and midline orientation due to mild posterior bias, and cues to attend to eating breakfast. Required 25 min to finish her english muffin and drink 1/2 of her hot cocoa due to decreased attention and very slow chewing and initiation with eating. Remains verbose throughout and requied cues for redirection to each task as opposed to initiating conversation. Continues to frequently ask to use the bathroom, stating "I might have to have a BM," redirected and asked patient to inform only when she is having a BM or feels the urge to have a BM. Continued with mobility and patient did not have a BM during session.   Therapeutic Activity: Bed Mobility: Patient performed sit to supine with min A for LE management due to decreased motor planning. Provided verbal cues for performing log rolling for reduced  back pain with mobility. Doffed R ankle brace and CAM boot with total A for time managementt and energy conservation with patient sitting EOB, prior to lying down. Transfers: Patient performed sit to/from stand x3 with CGA-min A using a RW and // bars and stand pivot x1 using RW and x1 holding PT's shoulders, both with mod A, improved stepping with RW, just poor safety awareness with RW while turning. Provided verbal cues for sequencing, weight shift for improved stepping L>R, and keeping RW close to her for safety.  Gait Training:  Patient ambulated 16 feet x2 with 2 180 deg turns using // bars with min A-CGA. Ambulated with step-to with intermittent step-though with cues, leading with L, demonstrates decreased weight shift L with decreased R step length and height. Provided verbal cues for sequencing, increased step length emphasized R swing through gait pattern, and facilitated L weight shift.  Wheelchair Mobility:  Patient was transported in the [place] w/c with total A throughout session for energy conservation and time management.  Patient in bed at end of session with breaks locked, bed alarm set, and all needs within reach.    Therapy Documentation Precautions:  Precautions Precautions: Fall Precaution Comments: drains removed Required Braces or Orthoses: Other Brace Other Brace: L cam boot; R lace up ankle brace; has sneaker for R foot Restrictions Weight Bearing Restrictions: No RLE Weight Bearing: Non weight bearing LLE Weight Bearing: Non weight bearing Other Position/Activity Restrictions: WBAT with braces   Therapy/Group: Individual Therapy  Shevaun Lovan L Moira Umholtz PT, DPT  07/18/2020, 12:19 PM

## 2020-07-18 NOTE — Progress Notes (Signed)
[facility] PHYSICAL MEDICINE & REHABILITATION PROGRESS NOTE   Subjective/Complaints:  Pt reports LBM was 2 days ago, but denies constipation- thinks can wait until Monday to see if needs Sorbitol/something to go   [facility] in parallel bars this AM- denies any issues otherwise.    ROS:   Pt denies SOB, abd pain, CP, N/V/C/D, and vision changes    Objective:   No results found. Recent Labs    07/16/20 1002  WBC 5.9  HGB 12.0  HCT 35.7*  PLT 413*   Recent Labs    07/16/20 1002  NA 124*  K 3.7  CL 92*  CO2 19*  GLUCOSE 147*  BUN 11  CREATININE 0.67  CALCIUM 9.0    Intake/Output Summary (Last 24 hours) at 07/18/2020 1926 Last data filed at 07/18/2020 1900 Gross per 24 hour  Intake 720 ml  Output 1000 ml  Net -280 ml     Physical Exam: Vital Signs Blood pressure 105/65, pulse 69, temperature 97.8 F (36.6 C), resp. rate 18, height 5\' 4"  (1.626 m), weight 74.8 kg, last menstrual period 11/14/2003, SpO2 94 %. Constitutional: laying in bed- appropriate, slightly tangential; head almost completely shaved, NAD HEENT: EOMI, oral membranes moist- staples out of scalp- healing well Neck: supple Cardiovascular: RRR  Respiratory/Chest: CTA B/L- no W/R/R- good air movement GI/Abdomen: Soft, NT, ND, (+)BS  Ext: no clubbing, cyanosis, or edema Psych: appropriate- tangential somewhat- alone today Musculoskeletal:   -pain along low back into sacrum, no bruising or swelling seen.   Skin:    Crani incisions CDI bilaterally Neurological:     Comments: alert, oriented to person, place. Remains verbose but more redirectable. Appears less distracted.  Light touch intact in all 4 extremities- moves all 4's.  Motor: 4+/5 in UE. LE 3-/5 prox to 4-/5 distally with ongoing pain inhibition in both legs still present       Assessment/Plan: 1. Functional deficits secondary to traumatic subdural hematoma which require 3+ hours per day of interdisciplinary therapy in a comprehensive  inpatient rehab setting.  Physiatrist is providing close team supervision and 24 hour management of active medical problems listed below.  Physiatrist and rehab team continue to assess barriers to discharge/monitor patient progress toward functional and medical goals  Care Tool:  Bathing  Bathing activity did not occur: Safety/medical concerns (Per MD orders )           Bathing assist       Upper Body Dressing/Undressing Upper body dressing   What is the patient wearing?: Button up shirt    Upper body assist Assist Level: Maximal Assistance - Patient 25 - 49%    Lower Body Dressing/Undressing Lower body dressing      What is the patient wearing?: Underwear/pull up, Incontinence brief     Lower body assist Assist for lower body dressing: Total Assistance - Patient < 25%     Toileting Toileting    Toileting assist Assist for toileting: Total Assistance - Patient < 25%     Transfers Chair/bed transfer  Transfers assist     Chair/bed transfer assist level: Moderate Assistance - Patient 50 - 74%     Locomotion Ambulation   Ambulation assist   Ambulation activity did not occur: Safety/medical concerns (symptomatic hypotension)  Assist level: Minimal Assistance - Patient > 75% Assistive device: Parallel bars Max distance: 18 ft   Walk 10 feet activity   Assist  Walk 10 feet activity did not occur: Safety/medical concerns  Assist level: Minimal Assistance -  Patient > 75% Assistive device: Parallel bars   Walk 50 feet activity   Assist Walk 50 feet with 2 turns activity did not occur: Safety/medical concerns         Walk 150 feet activity   Assist Walk 150 feet activity did not occur: Safety/medical concerns         Walk 10 feet on uneven surface  activity   Assist Walk 10 feet on uneven surfaces activity did not occur: Safety/medical concerns         Wheelchair     Assist Will patient use wheelchair at discharge?: No    Wheelchair activity did not occur: Safety/medical concerns         Wheelchair 50 feet with 2 turns activity    Assist    Wheelchair 50 feet with 2 turns activity did not occur: Safety/medical concerns       Wheelchair 150 feet activity     Assist  Wheelchair 150 feet activity did not occur: Safety/medical concerns       Blood pressure 105/65, pulse 69, temperature 97.8 F (36.6 C), resp. rate 18, height 5\' 4"  (1.626 m), weight 74.8 kg, last menstrual period 11/14/2003, SpO2 94 %.  Medical Problem List and Plan: 1.  Decreased functional ability with gait abnormality secondary to large subacute subdural hematoma bilaterally with significant mass-effect.  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             -needs SLP for poor focus and tangential behavior             -patient may shower--dry head well immediately after shower             -ELOS/Goals: 08/04/20- goals Supervision to min A  -Continue CIR  -lethargy: improved. Likely d/t UTI 2.  Antithrombotics: -DVT/anticoagulation: Subcutaneous heparin.  vascular study 8/27 shows age indeterminate clot left posterior tib vein. 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   -repeat u/s next week             -antiplatelet therapy: Aspirin 81 mg twice daily 3. Pain Management/headaches: Hydrocodone scheduled 10mg  q6H since she forgets to ask for pain medications, Topamax 75 mg nightly, Depakote 250 mg daily and 750 mg nightly.   -pt has blood tracking all the way to sacrum which is definitely related to her pain c/o  Kpad for low back/sacral pain  -added scheduled oxy IR at 0700 and 1200 to help with pain during activity  9/4- pain adequately controlled, but needs to keep off tailbone when possible per pt  9/5- pain doing well today- con't regimen 4. Mood: Klonopin 0.5 mg nightly as needed.   -remains  motivated. Occasionally anxious             -antipsychotic agents: N/A 5. Neuropsych: This patient is capable of making decisions on her own behalf.  -continue ritalin 10mg  bid for concentration 6. Skin/Wound Care: Routine skin checks 7. Fluids/Electrolytes/Nutrition:    -9/3 sodium back down to 124 today after trending up recently!  -continue to follow serially  -likely centrally driven.   -check urine sodium, add serum osmolality to today's labs  -start 1200 cc FR  9/4- labs Monday- will double check-  8.  Subdural hemorrhage extending throughout the length of the visualized spine mainly dorsal and most notable in the lower thoracic segments and at the L5-S1 level.  Unclear origin  - continue consvt  care  9.  Seizure prophylaxis.  Keppra 500 mg twice daily 10.  Hypertension.  Cozaar 100 mg daily.  Monitor with increased mobility. Well controlled  8/30 bp better, continue cozaar 75mg   -encourage fluids  9/4- BP well controlled- a little soft- con't regimen  9/5- BP 100s/50s- con't Cozaar 11.  Hyperlipidemia.  Lipitor 12.  Neurogenic bladder.  Urecholine 10 mg 3 times daily.  Check PVR x3- has been retaining- might need Flomax  8/30 100k E coli in urine S to ctx/cephalexin   -9/1: keflex  for 5 days  9/3 some improvement with urecholine 25mg  tid---continue   -OOB to void 13.  History of alcohol use. 1-2 drinks/day per pt-  Provide counseling. 14.  Left nondisplaced fracture of left lateral malleolus and nondisplaced fracture of lateral malleolus right fibula sprain of tibiofibular ligament/right ankle after recent fall 06/14/2020.     - WBAT bilateral lower extremities.    -Cam boot on left and lace up ankle brace on right. 15. Neurogenic bowel- incontinent  -should improve as mobility and cognitively she improves   -mag citrate, SSE today  -hemorrhoidal cream added  9/4- had a large BM- feeling better  9/5- wants to see if can go by tomorrow, before gets additional bowel meds-  willing to take sorbitol if no BM by 3pm Monday    LOS: 10 days A FACE TO FACE EVALUATION WAS PERFORMED  Ranie Chinchilla 07/18/2020, 7:26 PM

## 2020-07-19 ENCOUNTER — Inpatient Hospital Stay (HOSPITAL_COMMUNITY): Payer: Medicare Other

## 2020-07-19 ENCOUNTER — Inpatient Hospital Stay (HOSPITAL_COMMUNITY): Payer: Medicare Other | Admitting: Physical Therapy

## 2020-07-19 ENCOUNTER — Inpatient Hospital Stay (HOSPITAL_COMMUNITY): Payer: Medicare Other | Admitting: Speech Pathology

## 2020-07-19 LAB — CBC WITH DIFFERENTIAL/PLATELET
Abs Immature Granulocytes: 0.09 10*3/uL — ABNORMAL HIGH (ref 0.00–0.07)
Basophils Absolute: 0.1 10*3/uL (ref 0.0–0.1)
Basophils Relative: 1 %
Eosinophils Absolute: 0.2 10*3/uL (ref 0.0–0.5)
Eosinophils Relative: 4 %
HCT: 34.7 % — ABNORMAL LOW (ref 36.0–46.0)
Hemoglobin: 11.7 g/dL — ABNORMAL LOW (ref 12.0–15.0)
Immature Granulocytes: 2 %
Lymphocytes Relative: 21 %
Lymphs Abs: 1.2 10*3/uL (ref 0.7–4.0)
MCH: 32.5 pg (ref 26.0–34.0)
MCHC: 33.7 g/dL (ref 30.0–36.0)
MCV: 96.4 fL (ref 80.0–100.0)
Monocytes Absolute: 0.6 10*3/uL (ref 0.1–1.0)
Monocytes Relative: 11 %
Neutro Abs: 3.6 10*3/uL (ref 1.7–7.7)
Neutrophils Relative %: 61 %
Platelets: 389 10*3/uL (ref 150–400)
RBC: 3.6 MIL/uL — ABNORMAL LOW (ref 3.87–5.11)
RDW: 12.2 % (ref 11.5–15.5)
WBC: 5.8 10*3/uL (ref 4.0–10.5)
nRBC: 0 % (ref 0.0–0.2)

## 2020-07-19 LAB — BASIC METABOLIC PANEL
Anion gap: 10 (ref 5–15)
BUN: 12 mg/dL (ref 8–23)
CO2: 23 mmol/L (ref 22–32)
Calcium: 9 mg/dL (ref 8.9–10.3)
Chloride: 96 mmol/L — ABNORMAL LOW (ref 98–111)
Creatinine, Ser: 0.8 mg/dL (ref 0.44–1.00)
GFR calc Af Amer: >60 mL/min (ref >60)
GFR calc non Af Amer: >60 mL/min (ref >60)
Glucose, Bld: 106 mg/dL — ABNORMAL HIGH (ref 70–99)
Potassium: 4.1 mmol/L (ref 3.5–5.1)
Sodium: 129 mmol/L — ABNORMAL LOW (ref 135–145)

## 2020-07-19 LAB — SODIUM, URINE, RANDOM: Sodium, Ur: 17 mmol/L

## 2020-07-19 NOTE — Progress Notes (Signed)
[facility] PHYSICAL MEDICINE & REHABILITATION PROGRESS NOTE   Subjective/Complaints:  No new complaints. Moved bowels this weekend. Emptying bladder better. Tail bone pain still  ROS: Patient denies fever, rash, sore throat, blurred vision, nausea, vomiting, diarrhea, cough, shortness of breath or chest pain,   headache, or mood change.    Objective:   No results found. Recent Labs    07/16/20 1002 07/19/20 0627  WBC 5.9 5.8  HGB 12.0 11.7*  HCT 35.7* 34.7*  PLT 413* 389   Recent Labs    07/16/20 1002 07/19/20 0627  NA 124* 129*  K 3.7 4.1  CL 92* 96*  CO2 19* 23  GLUCOSE 147* 106*  BUN 11 12  CREATININE 0.67 0.80  CALCIUM 9.0 9.0    Intake/Output Summary (Last 24 hours) at 07/19/2020 0947 Last data filed at 07/18/2020 1900 Gross per 24 hour  Intake 480 ml  Output 1000 ml  Net -520 ml     Physical Exam: Vital Signs Blood pressure 119/63, pulse 73, temperature 98 F (36.7 C), resp. rate 17, height 5\' 4"  (1.626 m), weight 74.8 kg, last menstrual period 11/14/2003, SpO2 97 %. Constitutional: No distress . Vital signs reviewed. HEENT: EOMI, oral membranes moist Neck: supple Cardiovascular: RRR without murmur. No JVD    Respiratory/Chest: CTA Bilaterally without wheezes or rales. Normal effort    GI/Abdomen: BS +, non-tender, non-distended Ext: no clubbing, cyanosis, or edema Psych: pleasant and cooperative Musculoskeletal:   -pain along low back into sacrum ongoing Skin:    Crani incisions CDI bilaterally Neurological:     Comments: alert, oriented to person, place. Distracted. verbose  Light touch intact in all 4 extremities- moves all 4's.  Motor: 4+/5 in UE. LE 3-/5 prox to 4-/5 distally with ongoing pain inhibition in both legs still present       Assessment/Plan: 1. Functional deficits secondary to traumatic subdural hematoma which require 3+ hours per day of interdisciplinary therapy in a comprehensive inpatient rehab setting.  Physiatrist is  providing close team supervision and 24 hour management of active medical problems listed below.  Physiatrist and rehab team continue to assess barriers to discharge/monitor patient progress toward functional and medical goals  Care Tool:  Bathing  Bathing activity did not occur: Safety/medical concerns (Per MD orders )           Bathing assist       Upper Body Dressing/Undressing Upper body dressing   What is the patient wearing?: Button up shirt    Upper body assist Assist Level: Maximal Assistance - Patient 25 - 49%    Lower Body Dressing/Undressing Lower body dressing      What is the patient wearing?: Underwear/pull up, Incontinence brief     Lower body assist Assist for lower body dressing: Total Assistance - Patient < 25%     Toileting Toileting    Toileting assist Assist for toileting: Total Assistance - Patient < 25%     Transfers Chair/bed transfer  Transfers assist     Chair/bed transfer assist level: Moderate Assistance - Patient 50 - 74%     Locomotion Ambulation   Ambulation assist   Ambulation activity did not occur: Safety/medical concerns (symptomatic hypotension)  Assist level: Minimal Assistance - Patient > 75% Assistive device: Parallel bars Max distance: 18 ft   Walk 10 feet activity   Assist  Walk 10 feet activity did not occur: Safety/medical concerns  Assist level: Minimal Assistance - Patient > 75% Assistive device: Parallel bars  Walk 50 feet activity   Assist Walk 50 feet with 2 turns activity did not occur: Safety/medical concerns         Walk 150 feet activity   Assist Walk 150 feet activity did not occur: Safety/medical concerns         Walk 10 feet on uneven surface  activity   Assist Walk 10 feet on uneven surfaces activity did not occur: Safety/medical concerns         Wheelchair     Assist Will patient use wheelchair at discharge?: No   Wheelchair activity did not occur:  Safety/medical concerns         Wheelchair 50 feet with 2 turns activity    Assist    Wheelchair 50 feet with 2 turns activity did not occur: Safety/medical concerns       Wheelchair 150 feet activity     Assist  Wheelchair 150 feet activity did not occur: Safety/medical concerns       Blood pressure 119/63, pulse 73, temperature 98 F (36.7 C), resp. rate 17, height 5\' 4"  (1.626 m), weight 74.8 kg, last menstrual period 11/14/2003, SpO2 97 %.  Medical Problem List and Plan: 1.  Decreased functional ability with gait abnormality secondary to large subacute subdural hematoma bilaterally with significant mass-effect.  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             -needs SLP for poor focus and tangential behavior             -patient may shower--dry head well immediately after shower             -ELOS/Goals: 08/04/20- goals Supervision to min A  -Continue CIR    2.  Antithrombotics: -DVT/anticoagulation: Subcutaneous heparin.  vascular study 8/27 shows age indeterminate clot left posterior tib vein. 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   -repeat u/s next week             -antiplatelet therapy: Aspirin 81 mg twice daily 3. Pain Management/headaches: Hydrocodone scheduled 10mg  q6H since she forgets to ask for pain medications, Topamax 75 mg nightly, Depakote 250 mg daily and 750 mg nightly.   -pt has blood tracking all the way to sacrum which is definitely related to her pain c/o  Kpad for low back/sacral pain  -added scheduled oxy IR at 0700 and 1200 to help with pain during activity  9/6 pain with reasonble control 4. Mood: Klonopin 0.5 mg nightly as needed.   -remains motivated. Occasionally anxious             -antipsychotic agents: N/A 5. Neuropsych: This patient is capable of making decisions on her own behalf.  -continue ritalin 10mg  bid  for concentration 6. Skin/Wound Care: Routine skin checks 7. Fluids/Electrolytes/Nutrition:    -9/6 sodium up to 129  -continue 1200cc FR 8.  Subdural hemorrhage extending throughout the length of the visualized spine mainly dorsal and most notable in the lower thoracic segments and at the L5-S1 level.  Unclear origin  - continue consvt care  9.  Seizure prophylaxis.  Keppra 500 mg twice daily 10.  Hypertension.  Cozaar 75 mg daily.  Monitor with increased mobility. Well controlled  9/6- BP 100s/50s- con't Cozaar 11.  Hyperlipidemia.  Lipitor 12.  Neurogenic bladder.  Urecholine 10 mg 3 times daily.  Check PVR x3- has been retaining- might need Flomax  8/30 100k E coli  in urine S to ctx/cephalexin   -9/1: keflex completed  9/6   improvement with urecholine 25mg  tid---continue   -OOB to void 13.  History of alcohol use. 1-2 drinks/day per pt-  Provide counseling. 14.  Left nondisplaced fracture of left lateral malleolus and nondisplaced fracture of lateral malleolus right fibula sprain of tibiofibular ligament/right ankle after recent fall 06/14/2020.     - WBAT bilateral lower extremities.    -Cam boot on left and lace up ankle brace on right. 15. Neurogenic bowel- incontinent  -should improve as mobility and cognitively she improves   -mag citrate, SSE 9/3  -hemorrhoidal cream added  9/4- had a large BM- feeling better  9/5- had another bm   LOS: 11 days A FACE TO [place] 07/19/2020, 9:47 AM

## 2020-07-19 NOTE — Progress Notes (Signed)
Physical Therapy Session Note  Patient Details  Name: Amanda Collier MRN: [id number] Date of Birth: [birth year]  Today's Date: 07/19/2020 PT Individual Time: 1115-1200 PT Individual Time Calculation (min): 45 min   Short Term Goals: Week 2:  PT Short Term Goal 1 (Week 2): Patient will perform bed mobility with min A consitently without use of hospital bed features. PT Short Term Goal 2 (Week 2): Patient will tolerate sitting OOB >1 hour each day. PT Short Term Goal 3 (Week 2): Patient will perform basic transfers with min A using LRAD. PT Short Term Goal 4 (Week 2): Patient will ambulate >30 feet using LRAD with min A.  Skilled Therapeutic Interventions/Progress Updates:     Patient in bed upon PT arrival. Patient alert and agreeable to PT session. Patient denied pain during session, reports that she was incontinent of bladder upon PT arrival. Patient also stated that she told the NT that they were not cleared to don her braces and take her to the bathroom. PT instructed patient and nursing staff that they can don her braces and use the Stedy to transfer the patient into the bathroom as they have been doing. Patient did not recall that this has been done before. Noted patient with delayed initiation and increased time with all mobility today.   Therapeutic Activity: Bed Mobility: Patient performed rolling R/L with min A for PT to perform peri-care and don/doff incontinence brief with total A. performed supine to sit with CGA and significantly increased time with use of bed rail to push up. Provided verbal cues for log rolling for reduced back pain with mobility, setting bottom elbow to push up to sitting, and forward weight shift due to posterior bias when coming to sitting. Patient sat EOB >5 min with increased posterior bias requiring frequent tactile and verbal cues to maintain midline. PT donned B TED hose, pants, R ankle brace and shoe, and L CAM boot with total A with patient sitting EOB.  Patient doffed night gown with min A and cues for sequencing, and donned a bra with max A and t-shirt with min A.  Transfers: Patient performed sit to/from stand x2 with RW with min A for forward weight shift, continued to have mild posterior bias in standing. Pulled up patient's pants with total A during first stand and performed weight shifts and small marching in standing on second trial. Provided verbal cues for hand placement, forward weight shift, hip/trunk/knee extension in standing, and reaching back to sit to control descent. She performed standing pivot without a device, after initiating pivot patient unable to initiate stepping pattern despite verbal and tactile cues and manual facilitation for weight shifting, eventually PT provided max A to pivot patient to the chair and place her hips on the seat. Patient stated that she was dizzy and that was why she could not move. BP 92/59, HR 84.   Recommended patient attempt to sit in the TIS w/c for lunch. RN made aware that patient may not tolerate sitting up for a long time due to sacral pain in sitting and reinforced intermittent supervision with eating due to distractibility.   Patient in [place] w/c at end of session with breaks locked, seat belt alarm set, and all needs within reach. Doffed ankle brace, shoe, and CAM boot with patient in sitting for improved comfort, RN made aware.    Therapy Documentation Precautions:  Precautions Precautions: Fall Precaution Comments: drains removed Required Braces or Orthoses: Other Brace Other Brace: L cam boot; R  lace up ankle brace; has sneaker for R foot Restrictions Weight Bearing Restrictions: No RLE Weight Bearing: Non weight bearing LLE Weight Bearing: Non weight bearing Other Position/Activity Restrictions: WBAT with braces   Therapy/Group: Individual Therapy  Amanda Collier PT, DPT  07/19/2020, 4:12 PM

## 2020-07-19 NOTE — Progress Notes (Signed)
Physical Therapy Session Note  Patient Details  Name: Amanda Collier MRN: [id number] Date of Birth: Sep 01, 1946  Today's Date: 07/19/2020 PT Individual Time: 1630-1700 PT Individual Time Calculation (min): 30 min   Short Term Goals: Week 2:  PT Short Term Goal 1 (Week 2): Patient will perform bed mobility with min A consitently without use of hospital bed features. PT Short Term Goal 2 (Week 2): Patient will tolerate sitting OOB >1 hour each day. PT Short Term Goal 3 (Week 2): Patient will perform basic transfers with min A using LRAD. PT Short Term Goal 4 (Week 2): Patient will ambulate >30 feet using LRAD with min A.  Skilled Therapeutic Interventions/Progress Updates:    Pt received seated in bed, agreeable to PT session. No complaints of pain. Supine to sit with min A for some trunk control. Assisted pt with donning R ankle brace, R shoe, and L CAM boot while seated EOB. Sit to stand with CGA to stedy. Stedy transfer to [place]. Dependent transport via TIS to/from therapy gym. Sit to stand in // bars with close Supervision to [place]. Session focus on increasing step length in // bars. Trial use of hockey stick for visual cue for pt to step over. Pt struggles with increasing step length and height and frequently hits obstacle. Trial use of colored targets on ground with alt L/R stepping. Pt exhibits improved step length with use of targets. Pt requests to return to bed at end of session. Stedy transfer back to bed. Min A for RLE management back into bed. Pt left supine in bed with needs in reach, bed alarm in place at end of session.  Therapy Documentation Precautions:  Precautions Precautions: Fall Precaution Comments: drains removed Required Braces or Orthoses: Other Brace Other Brace: L cam boot; R lace up ankle brace; has sneaker for R foot Restrictions Weight Bearing Restrictions: No RLE Weight Bearing: Non weight bearing LLE Weight Bearing: Non weight bearing Other Position/Activity  Restrictions: WBAT with braces    Therapy/Group: Individual Therapy   Excell Seltzer, PT, DPT  07/19/2020, 5:18 PM

## 2020-07-19 NOTE — Progress Notes (Signed)
Speech Language Pathology Daily Session Note  Patient Details  Name: Amanda Collier MRN: [id number] Date of Birth: [birth year]  Today's Date: 07/19/2020 SLP Individual Time: 1000-1055 SLP Individual Time Calculation (min): 55 min  Short Term Goals: Week 2: SLP Short Term Goal 1 (Week 2): Patient will utilize speech intelligibility strategies at the conversation levle with supervision verbal cues to achieve ~90% intelligibility. SLP Short Term Goal 2 (Week 2): Patient will demonstrate functional problem solving for mildly complex tasks with Min A verbal cues. SLP Short Term Goal 3 (Week 2): Patient will recall new, daily information with Min A verbal and visual cues. SLP Short Term Goal 4 (Week 2): Patient will demonstrate sustained attention to functional tasks for 30 minutes with Mod verbal cues for redirection.  Skilled Therapeutic Interventions: Skilled treatment session focused on cognitive goals. Patient performed a mildly complex calendar/appointment making task with Mod I for problem solving, organization and sustained attention.  However, mild verbal cues were needed for topic maintenance during an informal conversation in which the SLP had to ask the specific question X 3 in order for patient to answer appropriately due to verbosity and tangents at times. Patient left upright in bed with alarm on and all needs within reach. Continue with current plan of care.      Pain Pain Assessment Pain Scale: 0-10 Pain Score: 0-No pain  Therapy/Group: Individual Therapy  Zackery Brine 07/19/2020, 10:58 AM

## 2020-07-19 NOTE — Progress Notes (Addendum)
Occupational Therapy Session Note  Patient Details  Name: Amanda Collier MRN: [id number] Date of Birth: November 17, 1945  Today's Date: 07/19/2020 OT Individual Time: 2481-8590 OT Individual Time Calculation (min): 58 min    Short Term Goals: Week 1:  OT Short Term Goal 1 (Week 1): Pt will don shirt wiht MIN A OT Short Term Goal 1 - Progress (Week 1): Met OT Short Term Goal 2 (Week 1): Pt will thread 1LE into pants OT Short Term Goal 2 - Progress (Week 1): Progressing toward goal OT Short Term Goal 3 (Week 1): PT will sit to stnad with MOD A of 1 wiht LRAD in prep for LB dressing OT Short Term Goal 3 - Progress (Week 1): Met OT Short Term Goal 4 (Week 1): Pt will sequence oral care with no VC OT Short Term Goal 4 - Progress (Week 1): Met OT Short Term Goal 5 (Week 1): Pt will initate grooming with no VC OT Short Term Goal 5 - Progress (Week 1): Progressing toward goal  Skilled Therapeutic Interventions/Progress Updates:    1:1. Pt received in bed with no report of pain initially but tail bone pain by end of session. Repositioning provided. Pt completes supine to sitting EOB with MIN A and OT provides total A to don CAM/ankle brace. Pt completes min-mod A SPT to TIS with VC for exaggerated movements. Pt completes oral care at sink with increased time for applying toothpaste to toothbrush d/t visual spatial deficits. Pt provided with 5 items to recall once down at the gift shop and cued to pay attention to directions as pt would find way back to room in w/c. Pt able ot recall 3/5 items once down at giftshop but it was closed therefore couldn't do visual scanning portion of activity. Pt taken to outside courtyard and completes 3 STS with MIN A pushing up from w/c and OT cued pt to hug OT to encourage forward weight shift and terminal hip extension. Pt able to direct OT pushing w/c back to room with use of external aides to locate room. Pt completes 10' functional mobility with cuing for large steps and  increased weight shift onto LLE when stepping with R foot. Exited session with pt seated in w/c, exit alarm ona dn call light tin reach  Therapy Documentation Precautions:  Precautions Precautions: Fall Precaution Comments: drains removed Required Braces or Orthoses: Other Brace Other Brace: L cam boot; R lace up ankle brace; has sneaker for R foot Restrictions Weight Bearing Restrictions: No RLE Weight Bearing: Non weight bearing LLE Weight Bearing: Non weight bearing Other Position/Activity Restrictions: WBAT with braces General:   Vital Signs:   Pain:   ADL: ADL Grooming: Minimal assistance Where Assessed-Grooming: Sitting at sink Upper Body Bathing: Unable to assess Lower Body Bathing: Unable to assess Upper Body Dressing: Maximal assistance Where Assessed-Upper Body Dressing: Edge of bed Lower Body Dressing: Maximal assistance Where Assessed-Lower Body Dressing: Edge of bed, Bed level Toileting: Maximal assistance Where Assessed-Toileting: Bedside Commode Toilet Transfer: Dependent (stedy) Toilet Transfer Equipment: Drop arm bedside commode Vision   Perception    Praxis   Exercises:   Other Treatments:     Therapy/Group: Individual Therapy  Tonny Branch 07/19/2020, 2:02 PM

## 2020-07-20 ENCOUNTER — Inpatient Hospital Stay (HOSPITAL_COMMUNITY): Payer: Medicare Other

## 2020-07-20 ENCOUNTER — Inpatient Hospital Stay (HOSPITAL_COMMUNITY): Payer: Medicare Other | Admitting: Speech Pathology

## 2020-07-20 MED ORDER — MAGNESIUM CITRATE PO SOLN
1.0000 | Freq: Once | ORAL | Status: AC
  Administered 2020-07-20: 1 via ORAL
  Filled 2020-07-20: qty 296

## 2020-07-20 NOTE — Progress Notes (Signed)
Patient ID: Amanda Collier, female   DOB: 19-Mar-1946, 74 y.o.   MRN: [id number]  SW met with pt in room at bedside to provide updates from team conference. Pt called husband while SW in room to discuss current updates. Pt husband to take pictures of home to discuss with with therapy which would be the best location for pt given that stairs could be a potential barrier to home. Family education schedule for 9/15-9/17 8am-12pm.   Loralee Pacas, MSW, [place] Office: [phone] Cell: [phone] Fax: [phone]

## 2020-07-20 NOTE — Progress Notes (Signed)
Physical Therapy Session Note  Patient Details  Name: Amanda Collier MRN: [id number] Date of Birth: [birth year]  Today's Date: 07/20/2020 PT Individual Time: 0900-0958 PT Individual Time Calculation (min): 58 min   Short Term Goals: Week 2:  PT Short Term Goal 1 (Week 2): Patient will perform bed mobility with min A consitently without use of hospital bed features. PT Short Term Goal 2 (Week 2): Patient will tolerate sitting OOB >1 hour each day. PT Short Term Goal 3 (Week 2): Patient will perform basic transfers with min A using LRAD. PT Short Term Goal 4 (Week 2): Patient will ambulate >30 feet using LRAD with min A.  Skilled Therapeutic Interventions/Progress Updates:     Patient in TIS w/c upon PT arrival. Patient alert and agreeable to PT session. Patient did not report pain throughout session.   Therapeutic Activity: Bed Mobility: Patient performed sit to supine with supervision. Provided verbal cues for log roll technique and shifting in the bed for improved positioning in the bed. Transfers: Patient performed sit to/from stand x3 and blocked practice stand pivot transfers x6, w/c<>mat table, and x1 w/c>bed with CGA using the RW. Provided verbal cues for hand placement on RW, scooting forward and forward weight shift to stand, sequencing for pivot, placing both legs against what she was going to sit on, and reaching back to control descent.  Gait Training:  Pre-gait: Patient performed alternating step-taps on 6" steps x15 with B UE support on rails for improved weight shift to stance leg and increased step height in preparation for gait training.  Patient ambulated 78 feet and 30 feet using RW with min A to facilitate weight shift L>R. Ambulated with significantly decreased step length and step height L>R and decreased gait speed. Focused on increased step length and height cuing for step-through gait pattern and manually pushing RW forward and a steady rate to promote reciprocal  stepping. She also ambulated 15 feet with visual targets of colored dots on the floor followed by 15 ft without targets for improved step length and reciprocal gait pattern.  Wheelchair Mobility:  Patient was transported in the [place] w/c with total A throughout session for energy conservation and time management.  Patient in bed at end of session with breaks locked, bed alarm set, and all needs within reach.    Therapy Documentation Precautions:  Precautions Precautions: Fall Precaution Comments: drains removed Required Braces or Orthoses: Other Brace Other Brace: L cam boot; R lace up ankle brace; has sneaker for R foot Restrictions Weight Bearing Restrictions: No RLE Weight Bearing: Non weight bearing LLE Weight Bearing: Non weight bearing Other Position/Activity Restrictions: WBAT with braces   Therapy/Group: Individual Therapy  Joncarlo Friberg L Shontavia Mickel PT, DPT  07/20/2020, 12:34 PM

## 2020-07-20 NOTE — Progress Notes (Signed)
Speech Language Pathology Daily Session Notes  Patient Details  Name: Amanda Collier MRN: [id number] Date of Birth: 1946/02/25  Today's Date: 07/20/2020  Session 1: SLP Individual Time: 1030-1100 SLP Individual Time Calculation (min): 30 min   Session 1: SLP Individual Time: 1130-1155 SLP Individual Time Calculation (min): 25 min   Short Term Goals: Week 2: SLP Short Term Goal 1 (Week 2): Patient will utilize speech intelligibility strategies at the conversation levle with supervision verbal cues to achieve ~90% intelligibility. SLP Short Term Goal 2 (Week 2): Patient will demonstrate functional problem solving for mildly complex tasks with Min A verbal cues. SLP Short Term Goal 3 (Week 2): Patient will recall new, daily information with Min A verbal and visual cues. SLP Short Term Goal 4 (Week 2): Patient will demonstrate sustained attention to functional tasks for 30 minutes with Mod verbal cues for redirection.  Skilled Therapeutic Interventions:  Session 1: Skilled treatment session focused on cognitive goals. SLP facilitated session by providing extra time and overall Supervision verbal cues for problem solving and overall Mod I for attention during a complex, familiar task. Patient demonstrates improved attention to structured tasks but continues to require multiple repetitions of questions for direct responses due to verbosity and tangents at time. Patient left upright in bed with alarm on and all needs within reach. Continue with current plan of care.   Session 2: Skilled treatment session focused on cognitive goals. SLP facilitated session by providing extra time and overall Min A verbal cues for reasoning and inferences during a flight itinerary task. Patient left upright in bed with alarm on and all needs within reach. Continue with current plan of care.    Pain Pain Assessment Pain Scale: 0-10 Pain Score: 6  Pain Type: Acute pain Pain Location: Coccyx Pain Orientation:  Posterior Pain Descriptors / Indicators: Aching Pain Frequency: Constant Pain Onset: On-going Pain Intervention(s): Medication (See eMAR)  Therapy/Group: Individual Therapy  Jerrid Forgette 07/20/2020, 11:17 AM

## 2020-07-20 NOTE — Patient Care Conference (Signed)
Inpatient RehabilitationTeam Conference and Plan of Care Update Date: 07/20/2020   Time: 10:03 AM    Patient Name: Amanda Collier      Medical Record Number: [id number]  Date of Birth: Nov 28, 1945 Sex: Female         Room/Bed: 4W20C/4W20C-01 Payor Info: Payor: MEDICARE / Plan: MEDICARE PART A AND B / Product Type: *No Product type* /    Admit Date/Time:  07/08/2020  4:58 PM  Primary Diagnosis:  Traumatic subdural hematoma [facility])  Hospital Problems: Principal Problem:   Traumatic subdural hematoma [facility])    Expected Discharge Date: Expected Discharge Date: 08/04/20  Team Members Present: Physician leading conference: Dr. Alger Simons Care Coodinator Present: Loralee Pacas, LCSWA;Shreshta Medley Creig Hines, RN, BSN, [place] Nurse Present: Other (comment) Hyacinth Meeker, RN) PT Present: Apolinar Junes, PT OT Present: Laverle Hobby, OT SLP Present: Weston Anna, SLP PPS Coordinator present : Ileana Ladd, Burna Mortimer, SLP     Current Status/Progress Goal Weekly Team Focus  Bowel/Bladder   incontinent of bowel and bladder; urinary retention requiring i/o cath  keep clean and dry to prevent skin breakdown  continue to assess need for pericare and i/o cath to relieve urinary retention   Swallow/Nutrition/ Hydration             ADL's   Min A UB dressing, mod A LB dressing,  min-mod A SPT, poor attention to task  Supervision overall- will need to be downgraded to min A  ADL retraining, cognitive retraining, ADL transfers, family education   Mobility   Min A bed mobility and sit<>stand, mod A stand pivot, min A-CGA gait in // bars 16 ft  Min A overall, gait 75 ft and 8 steps to second level of home  balance, transfers, gait and stair training, general strengthening, attention, functional mobility, activity tolerance, sitting tolerance, patient/caregiver education   Communication   Mod I  Mod I  Goal Met   Safety/Cognition/ Behavioral Observations  Min-Mod A  Supervision  attention,  problem solving and recall   Pain   scheduled vicodin; prn tylenol  pain rating <6  assess pain rating qshift and prn; continue to assist with repositioning to help decrease pain rating   Skin   skin is warm, dry, and intact; surgical incisions bilateral head approximated  keep skin clean and dry to prevent breakdown  assess skin qshift, at each brief change and prn     Discharge Planning:  D/c to home with her husband who will provide 24/7 care. Reports they are in the process of renovating at their home and there is a bedroom and bathroom upstairs where pt can stay.   Team Discussion: Pain to sacrum, bilateral LE pain, urinary retention/incontinent B/B. OT may upgrade goals to Supervision. PT standing at min assist with min assist goals. Patient on target to meet rehab goals: yes  *See Care Plan and progress notes for long and short-term goals.   Revisions to Treatment Plan:  None  Teaching Needs: Continue with family education.  Current Barriers to Discharge: Decreased caregiver support, Home enviroment access/layout, Incontinence and pain, stairs, and problem solving.  Possible Resolutions to Barriers: MD giving meds for urinary retention, constipation, family education ongoing, nursing managing pain with scheduled and prn medication regimen. PT focusing on stairs with goal of 8 stairs to enter home, and SLP working on strategies to assist with problem solving.     Medical Summary Current Status: improving cognitively, responding to ritalin. bladder now emptying. constipation. hyponatremia  Barriers to Discharge:  Medical stability   Possible Resolutions to [employer] Focus: 1200 cc FR, urecholine for bladder. rx for constipation, ritalin titration   Continued Need for Acute Rehabilitation Level of Care: The patient requires daily medical management by a physician with specialized training in physical medicine and rehabilitation for the following reasons: Direction of a  multidisciplinary physical rehabilitation program to maximize functional independence : Yes Medical management of patient stability for increased activity during participation in an intensive rehabilitation regime.: Yes Analysis of laboratory values and/or radiology reports with any subsequent need for medication adjustment and/or medical intervention. : Yes   I attest that I was present, lead the team conference, and concur with the assessment and plan of the team.   Cristi Loron 07/20/2020, 12:14 PM

## 2020-07-20 NOTE — Progress Notes (Signed)
Occupational Therapy Session Note  Patient Details  Name: Amanda Collier MRN: [id number] Date of Birth: 10-17-1946  Today's Date: 07/20/2020 OT Individual Time: 6269-4854 OT Individual Time Calculation (min): 70 min    Short Term Goals: Week 2:  OT Short Term Goal 1 (Week 2): Pt will complete oral care with set up OT Short Term Goal 2 (Week 2): Pt will transfer to toilet with MIN A and LRAD OT Short Term Goal 3 (Week 2): Pt will thread 1LE into pants with AE PRN OT Short Term Goal 4 (Week 2): Pt will bathe UB with no VC for sequencing  Skilled Therapeutic Interventions/Progress Updates:    Pt received supine eating breakfast. Pt initially unable to remember therapist but after reminder of weekend beach trip pt able to recall specific beach OT went to. Pt fully oriented with min use of external aids- no cueing required to use. Pt c/o pain in her tailbone 6/10, no request for intervention. BP 113/78 while supine. Pt transitioned to EOB with use of bed rail, HOB slightly elevated with supervision. Pt able to sit EOB for 30+ minutes with no LOB. Pt reporting the room is "bouncing", BP 122/79 after 10 min sitting- pt able to recall and use gaze stabilization as a strategy without cueing. Mod A to don bra, to fasten anteriorly. Min A to don shirt. Cam boot and ankle brace donned total A. BP EOB 122/79. Pt threaded pants over BLE with mod A. Pt completed sit > stand from elevated EOB with min A! Min A to pull up pants in standing, removing BUE from RW. Stand pivot transfer to w/c with min A- great improvement for pt. Pt completed oral care at the sink with set up assist. No change in pain levels in tailbone following transfer. Pt completed 10 ft of functional mobility with RW with min A with +2 for w/c follow. Pt was returned to her TIS and left sitting up with all needs met, RN present attending to needs. BP at end of session 125/73.   Therapy Documentation Precautions:  Precautions Precautions:  Fall Precaution Comments: drains removed Required Braces or Orthoses: Other Brace Other Brace: L cam boot; R lace up ankle brace; has sneaker for R foot Restrictions Weight Bearing Restrictions: No RLE Weight Bearing: Non weight bearing LLE Weight Bearing: Non weight bearing Other Position/Activity Restrictions: WBAT with braces  Therapy/Group: Individual Therapy  Curtis Sites 07/20/2020, 7:00 AM

## 2020-07-20 NOTE — Plan of Care (Signed)
  Problem: Consults Goal: RH BRAIN INJURY PATIENT EDUCATION Description: Description: See Patient Education module for eduction specifics Outcome: Progressing Goal: Skin Care Protocol Initiated - if Braden Score 18 or less Description: If consults are not indicated, leave blank or document N/A Outcome: Progressing Goal: Nutrition Consult-if indicated Outcome: Progressing   Problem: RH BOWEL ELIMINATION Goal: RH STG MANAGE BOWEL WITH ASSISTANCE Description: STG Manage Bowel with mod I Assistance. Outcome: Progressing Goal: RH STG MANAGE BOWEL W/MEDICATION W/ASSISTANCE Description: STG Manage Bowel with Medication with mod I Assistance. Outcome: Progressing   Problem: RH BLADDER ELIMINATION Goal: RH STG MANAGE BLADDER WITH ASSISTANCE Description: STG Manage Bladder With mod I Assistance Outcome: Progressing Goal: RH STG MANAGE BLADDER WITH MEDICATION WITH ASSISTANCE Description: STG Manage Bladder With Medication With mod I Assistance. Outcome: Progressing   Problem: RH SKIN INTEGRITY Goal: RH STG SKIN FREE OF INFECTION/BREAKDOWN Description: Pt will be free of skin breakdown/infection prior to DC with min assist.  Outcome: Progressing Goal: RH STG ABLE TO PERFORM INCISION/WOUND CARE W/ASSISTANCE Description: STG Able To Perform Incision/Wound Care With min Assistance. Outcome: Progressing   Problem: RH SAFETY Goal: RH STG ADHERE TO SAFETY PRECAUTIONS W/ASSISTANCE/DEVICE Description: STG Adhere to Safety Precautions With cues/reminders with AD Outcome: Progressing   Problem: RH COGNITION-NURSING Goal: RH STG USES MEMORY AIDS/STRATEGIES W/ASSIST TO PROBLEM SOLVE Description: STG Uses Memory Aids/Strategies With cues/reminders Assistance to Problem Solve. Outcome: Progressing   Problem: RH PAIN MANAGEMENT Goal: RH STG PAIN MANAGED AT OR BELOW PT'S PAIN GOAL Description: Less than 3 on 0-10 scale Outcome: Progressing   Problem: RH KNOWLEDGE DEFICIT BRAIN INJURY Goal: RH  STG INCREASE KNOWLEDGE OF SELF CARE AFTER BRAIN INJURY Description: Pt will be able to demonstrate understanding of BI teaching, safety precautions, falls, and medication management with supervision assist using handouts/booklets prior to DC.  Outcome: Progressing   Problem: Consults Goal: RH BRAIN INJURY PATIENT EDUCATION Description: Description: See Patient Education module for eduction specifics Outcome: Progressing

## 2020-07-20 NOTE — Progress Notes (Signed)
[facility] PHYSICAL MEDICINE & REHABILITATION PROGRESS NOTE   Subjective/Complaints: Reports only small bm yesterday. Doesn't feel bloated or bad. Ongoing low back pain  ROS: Patient denies fever, rash, sore throat, blurred vision, nausea, vomiting, diarrhea, cough, shortness of breath or chest pain, joint or back pain, headache, or mood change.    Objective:   No results found. Recent Labs    07/19/20 0627  WBC 5.8  HGB 11.7*  HCT 34.7*  PLT 389   Recent Labs    07/19/20 0627  NA 129*  K 4.1  CL 96*  CO2 23  GLUCOSE 106*  BUN 12  CREATININE 0.80  CALCIUM 9.0    Intake/Output Summary (Last 24 hours) at 07/20/2020 0925 Last data filed at 07/19/2020 1326 Gross per 24 hour  Intake --  Output 900 ml  Net -900 ml     Physical Exam: Vital Signs Blood pressure 95/65, pulse 78, temperature 98.2 F (36.8 C), resp. rate 16, height 5\' 4"  (1.626 m), weight 74.8 kg, last menstrual period 11/14/2003, SpO2 94 %. Constitutional: No distress . Vital signs reviewed. HEENT: EOMI, oral membranes moist Neck: supple Cardiovascular: RRR without murmur. No JVD    Respiratory/Chest: CTA Bilaterally without wheezes or rales. Normal effort    GI/Abdomen: BS scarce but soft,  non-tender, non-distended Ext: no clubbing, cyanosis, or edema Psych: pleasant and cooperative Musculoskeletal:   -pain along low back into sacrum ongoing Skin:    Crani incisions CDI bilaterally Neurological:     Comments: alert and oriented. Still verbose but more focused.  Light touch intact in all 4 extremities- moves all 4's.  Motor: 4+/5 in UE. LE 3-/5 prox to 4-/5 distally with ongoing pain inhibition in both legs still present       Assessment/Plan: 1. Functional deficits secondary to traumatic subdural hematoma which require 3+ hours per day of interdisciplinary therapy in a comprehensive inpatient rehab setting.  Physiatrist is providing close team supervision and 24 hour management of active  medical problems listed below.  Physiatrist and rehab team continue to assess barriers to discharge/monitor patient progress toward functional and medical goals  Care Tool:  Bathing  Bathing activity did not occur: Safety/medical concerns (Per MD orders ) Body parts bathed by patient: Right arm, Left arm, Chest, Abdomen, Right upper leg, Left upper leg, Face   Body parts bathed by helper: Right lower leg, Left lower leg     Bathing assist Assist Level: Moderate Assistance - Patient 50 - 74%     Upper Body Dressing/Undressing Upper body dressing   What is the patient wearing?: Pull over shirt    Upper body assist Assist Level: Minimal Assistance - Patient > 75%    Lower Body Dressing/Undressing Lower body dressing      What is the patient wearing?: Pants     Lower body assist Assist for lower body dressing: Moderate Assistance - Patient 50 - 74%     Toileting Toileting    Toileting assist Assist for toileting: Total Assistance - Patient < 25%     Transfers Chair/bed transfer  Transfers assist     Chair/bed transfer assist level: Dependent - mechanical lift (stedy)     Locomotion Ambulation   Ambulation assist   Ambulation activity did not occur: Safety/medical concerns (symptomatic hypotension)  Assist level: Minimal Assistance - Patient > 75% Assistive device: Parallel bars Max distance: 18 ft   Walk 10 feet activity   Assist  Walk 10 feet activity did not occur: Safety/medical concerns  Assist level: Minimal Assistance - Patient > 75% Assistive device: Parallel bars   Walk 50 feet activity   Assist Walk 50 feet with 2 turns activity did not occur: Safety/medical concerns         Walk 150 feet activity   Assist Walk 150 feet activity did not occur: Safety/medical concerns         Walk 10 feet on uneven surface  activity   Assist Walk 10 feet on uneven surfaces activity did not occur: Safety/medical concerns          Wheelchair     Assist Will patient use wheelchair at discharge?: No   Wheelchair activity did not occur: Safety/medical concerns         Wheelchair 50 feet with 2 turns activity    Assist    Wheelchair 50 feet with 2 turns activity did not occur: Safety/medical concerns       Wheelchair 150 feet activity     Assist  Wheelchair 150 feet activity did not occur: Safety/medical concerns       Blood pressure 95/65, pulse 78, temperature 98.2 F (36.8 C), resp. rate 16, height 5\' 4"  (1.626 m), weight 74.8 kg, last menstrual period 11/14/2003, SpO2 94 %.  Medical Problem List and Plan: 1.  Decreased functional ability with gait abnormality secondary to large subacute subdural hematoma bilaterally with significant mass-effect.  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             -needs SLP for poor focus and tangential behavior             -patient may shower-             -ELOS/Goals: 08/04/20- goals Supervision to min A  -team conference today    2.  Antithrombotics: -DVT/anticoagulation: Subcutaneous heparin.  vascular study 8/27 shows age indeterminate clot left posterior tib vein. 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   -repeat u/s next week             -antiplatelet therapy: Aspirin 81 mg twice daily 3. Pain Management/headaches: Hydrocodone scheduled 10mg  q6H since she forgets to ask for pain medications, Topamax 75 mg nightly, Depakote 250 mg daily and 750 mg nightly.   -pt has blood tracking all the way to sacrum which is definitely related to her pain c/o  Kpad for low back/sacral pain  -added scheduled oxy IR at 0700 and 1200 to help with pain during activity  9/7 pain with reasonble control 4. Mood: Klonopin 0.5 mg nightly as needed.   -remains motivated. Occasionally anxious             -antipsychotic agents: N/A 5. Neuropsych: This  patient is capable of making decisions on her own behalf.  -continue ritalin 10mg  bid for concentration 6. Skin/Wound Care: Routine skin checks 7. Fluids/Electrolytes/Nutrition:    -9/6 sodium up to 129  -continue 1200cc FR  -recheck labs on Thursday 8.  Subdural hemorrhage extending throughout the length of the visualized spine mainly dorsal and most notable in the lower thoracic segments and at the L5-S1 level.  Unclear origin  - continue consvt care  9.  Seizure prophylaxis.  Keppra 500 mg twice daily 10.  Hypertension.  Cozaar 75 mg daily.  Monitor with increased mobility. Well controlled  9/6- BP 100s/50s- con't Cozaar 11.  Hyperlipidemia.  Lipitor 12.  Neurogenic bladder.  Urecholine 10 mg 3 times daily.  Check PVR x3- has been retaining- might need Flomax  8/30 100k E coli in urine S to ctx/cephalexin   -9/1: keflex completed  9/7   improvement with urecholine 25mg  tid-    -OOB to void 13.  History of alcohol use. 1-2 drinks/day per pt-  Provide counseling. 14.  Left nondisplaced fracture of left lateral malleolus and nondisplaced fracture of lateral malleolus right fibula sprain of tibiofibular ligament/right ankle after recent fall 06/14/2020.     - WBAT bilateral lower extremities.    -Cam boot on left and lace up ankle brace on right. 15. Neurogenic bowel- incontinent  -should improve as mobility and cognitively she improves   -mag citrate, SSE 9/3---repeat today as had results with this on 9/3  LOS: 12 days A FACE TO [place] 07/20/2020, 9:25 AM

## 2020-07-21 ENCOUNTER — Other Ambulatory Visit: Payer: Self-pay | Admitting: Neurology

## 2020-07-21 ENCOUNTER — Inpatient Hospital Stay (HOSPITAL_COMMUNITY): Payer: Medicare Other

## 2020-07-21 ENCOUNTER — Inpatient Hospital Stay (HOSPITAL_COMMUNITY): Payer: Medicare Other | Admitting: Speech Pathology

## 2020-07-21 NOTE — Progress Notes (Signed)
Pt received soap suds enema and had a small bowel movement. No other concerns to report.

## 2020-07-21 NOTE — Progress Notes (Signed)
Patient ID: Amanda Collier, female   DOB: February 04, 1946, 74 y.o.   MRN: [id number]   SW met with pt in room to provide [facility] list ([email or web address]). SW to follow up with regard to preference.   Loralee Pacas, MSW, [place] Office: [phone] Cell: [phone] Fax: [phone]

## 2020-07-21 NOTE — Plan of Care (Signed)
  Problem: Consults Goal: RH BRAIN INJURY PATIENT EDUCATION Description: Description: See Patient Education module for eduction specifics Outcome: Progressing Goal: Skin Care Protocol Initiated - if Braden Score 18 or less Description: If consults are not indicated, leave blank or document N/A Outcome: Progressing Goal: Nutrition Consult-if indicated Outcome: Progressing   Problem: RH BOWEL ELIMINATION Goal: RH STG MANAGE BOWEL WITH ASSISTANCE Description: STG Manage Bowel with mod I Assistance. Outcome: Progressing Goal: RH STG MANAGE BOWEL W/MEDICATION W/ASSISTANCE Description: STG Manage Bowel with Medication with mod I Assistance. Outcome: Progressing   Problem: RH BLADDER ELIMINATION Goal: RH STG MANAGE BLADDER WITH ASSISTANCE Description: STG Manage Bladder With mod I Assistance Outcome: Progressing Goal: RH STG MANAGE BLADDER WITH MEDICATION WITH ASSISTANCE Description: STG Manage Bladder With Medication With mod I Assistance. Outcome: Progressing   Problem: RH SKIN INTEGRITY Goal: RH STG SKIN FREE OF INFECTION/BREAKDOWN Description: Pt will be free of skin breakdown/infection prior to DC with min assist.  Outcome: Progressing Goal: RH STG ABLE TO PERFORM INCISION/WOUND CARE W/ASSISTANCE Description: STG Able To Perform Incision/Wound Care With min Assistance. Outcome: Progressing   Problem: RH SAFETY Goal: RH STG ADHERE TO SAFETY PRECAUTIONS W/ASSISTANCE/DEVICE Description: STG Adhere to Safety Precautions With cues/reminders with AD Outcome: Progressing   Problem: RH COGNITION-NURSING Goal: RH STG USES MEMORY AIDS/STRATEGIES W/ASSIST TO PROBLEM SOLVE Description: STG Uses Memory Aids/Strategies With cues/reminders Assistance to Problem Solve. Outcome: Progressing   Problem: RH PAIN MANAGEMENT Goal: RH STG PAIN MANAGED AT OR BELOW PT'S PAIN GOAL Description: Less than 3 on 0-10 scale Outcome: Progressing   Problem: RH KNOWLEDGE DEFICIT BRAIN INJURY Goal: RH  STG INCREASE KNOWLEDGE OF SELF CARE AFTER BRAIN INJURY Description: Pt will be able to demonstrate understanding of BI teaching, safety precautions, falls, and medication management with supervision assist using handouts/booklets prior to DC.  Outcome: Progressing   Problem: Consults Goal: RH BRAIN INJURY PATIENT EDUCATION Description: Description: See Patient Education module for eduction specifics Outcome: Progressing

## 2020-07-21 NOTE — Progress Notes (Signed)
Speech Language Pathology Daily Session Note  Patient Details  Name: Amanda Collier MRN: [id number] Date of Birth: [birth year]  Today's Date: 07/21/2020 SLP Individual Time: 2703-5009 SLP Individual Time Calculation (min): 55 min  Short Term Goals: Week 2: SLP Short Term Goal 1 (Week 2): Patient will utilize speech intelligibility strategies at the conversation levle with supervision verbal cues to achieve ~90% intelligibility. SLP Short Term Goal 2 (Week 2): Patient will demonstrate functional problem solving for mildly complex tasks with Min A verbal cues. SLP Short Term Goal 3 (Week 2): Patient will recall new, daily information with Min A verbal and visual cues. SLP Short Term Goal 4 (Week 2): Patient will demonstrate sustained attention to functional tasks for 30 minutes with Mod verbal cues for redirection.  Skilled Therapeutic Interventions: Skilled treatment session focused on cognitive goals. Upon arrival, patient had just opened her cereal. Patient demonstrated alternating attention between self-feeding and setting-up/self-feeding her cereal with overall Min A verbal cues for ~30 minutes. Patient continues to be verbose at times and will often repeat herself throughout the session. SLP facilitated functional conversation about current cognitive functioning, cognitive gains and ongoing cognitive goals needed to address ongoing issues with attention and complex organization. She verbalized understanding and agreement. Patient continues to attempt to utilize visual aids for recall but information is written down in an disorganized fashion making it difficult to utilize effectively. Therefore, the next session will be focused on containing all functional information within one folder, area, etc. Patient left upright in bed with alarm on and all needs within reach. Continue with current plan of care.      Pain No/Denies Pain  Therapy/Group: Individual Therapy  Alejandra Hunt 07/21/2020,  2:45 PM

## 2020-07-21 NOTE — Progress Notes (Signed)
[facility] PHYSICAL MEDICINE & REHABILITATION PROGRESS NOTE   Subjective/Complaints: Had small BM after soap suds enema yesterday.  No complaints this morning. Denies pain.  Eating banana and cheerios.  ROS: Patient denies fever, rash, sore throat, blurred vision, nausea, vomiting, diarrhea, cough, shortness of breath or chest pain, joint or back pain, headache, or mood change.    Objective:   No results found. Recent Labs    07/19/20 0627  WBC 5.8  HGB 11.7*  HCT 34.7*  PLT 389   Recent Labs    07/19/20 0627  NA 129*  K 4.1  CL 96*  CO2 23  GLUCOSE 106*  BUN 12  CREATININE 0.80  CALCIUM 9.0    Intake/Output Summary (Last 24 hours) at 07/21/2020 7846 Last data filed at 07/20/2020 2042 Gross per 24 hour  Intake 420 ml  Output 1300 ml  Net -880 ml     Physical Exam: Vital Signs Blood pressure 101/64, pulse 74, temperature 98.6 F (37 C), resp. rate 17, height 5\' 4"  (1.626 m), weight 74.8 kg, last menstrual period 11/14/2003, SpO2 96 %. General: Alert and oriented x 3, No apparent distress HEENT: Head is normocephalic, atraumatic, PERRLA, EOMI, sclera anicteric, oral mucosa pink and moist, dentition intact, ext ear canals clear,  Neck: Supple without JVD or lymphadenopathy Heart: Reg rate and rhythm. No murmurs rubs or gallops Chest: CTA bilaterally without wheezes, rales, or rhonchi; no distress Abdomen: Soft, non-tender, non-distended, bowel sounds positive. Musculoskeletal:   -pain along low back into sacrum ongoing Skin:    Crani incisions CDI bilaterally Neurological:     Comments: alert and oriented. Still verbose but more focused.  Light touch intact in all 4 extremities- moves all 4's.  Motor: 4+/5 in UE. LE 3-/5 prox to 4-/5 distally with ongoing pain inhibition in both legs still present Psych: verbose, tangential, very pleasant  Assessment/Plan: 1. Functional deficits secondary to traumatic subdural hematoma which require 3+ hours per day of  interdisciplinary therapy in a comprehensive inpatient rehab setting.  Physiatrist is providing close team supervision and 24 hour management of active medical problems listed below.  Physiatrist and rehab team continue to assess barriers to discharge/monitor patient progress toward functional and medical goals  Care Tool:  Bathing  Bathing activity did not occur: Safety/medical concerns (Per MD orders ) Body parts bathed by patient: Right arm, Left arm, Chest, Abdomen, Right upper leg, Left upper leg, Face   Body parts bathed by helper: Right lower leg, Left lower leg     Bathing assist Assist Level: Moderate Assistance - Patient 50 - 74%     Upper Body Dressing/Undressing Upper body dressing   What is the patient wearing?: Pull over shirt    Upper body assist Assist Level: Minimal Assistance - Patient > 75%    Lower Body Dressing/Undressing Lower body dressing      What is the patient wearing?: Pants     Lower body assist Assist for lower body dressing: Moderate Assistance - Patient 50 - 74%     Toileting Toileting    Toileting assist Assist for toileting: Total Assistance - Patient < 25%     Transfers Chair/bed transfer  Transfers assist     Chair/bed transfer assist level: Minimal Assistance - Patient > 75%     Locomotion Ambulation   Ambulation assist   Ambulation activity did not occur: Safety/medical concerns (symptomatic hypotension)  Assist level: Minimal Assistance - Patient > 75% Assistive device: Walker-rolling Max distance: 78 ft  Walk 10 feet activity   Assist  Walk 10 feet activity did not occur: Safety/medical concerns  Assist level: Minimal Assistance - Patient > 75% Assistive device: Walker-rolling   Walk 50 feet activity   Assist Walk 50 feet with 2 turns activity did not occur: Safety/medical concerns  Assist level: Minimal Assistance - Patient > 75% Assistive device: Walker-rolling    Walk 150 feet  activity   Assist Walk 150 feet activity did not occur: Safety/medical concerns         Walk 10 feet on uneven surface  activity   Assist Walk 10 feet on uneven surfaces activity did not occur: Safety/medical concerns         Wheelchair     Assist Will patient use wheelchair at discharge?: No   Wheelchair activity did not occur: Safety/medical concerns         Wheelchair 50 feet with 2 turns activity    Assist    Wheelchair 50 feet with 2 turns activity did not occur: Safety/medical concerns       Wheelchair 150 feet activity     Assist  Wheelchair 150 feet activity did not occur: Safety/medical concerns       Blood pressure 101/64, pulse 74, temperature 98.6 F (37 C), resp. rate 17, height 5\' 4"  (1.626 m), weight 74.8 kg, last menstrual period 11/14/2003, SpO2 96 %.  Medical Problem List and Plan: 1.  Decreased functional ability with gait abnormality secondary to large subacute subdural hematoma bilaterally with significant mass-effect.  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             -needs SLP for poor focus and tangential behavior             -patient may shower-             -ELOS/Goals: 08/04/20- goals Supervision to min A  -continue CIR  2.  Antithrombotics: -DVT/anticoagulation: Subcutaneous heparin.  vascular study 8/27 shows age indeterminate clot left posterior tib vein. 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   -repeat u/s next week             -antiplatelet therapy: Aspirin 81 mg twice daily 3. Pain Management/headaches: Hydrocodone scheduled 10mg  q6H since she forgets to ask for pain medications, Topamax 75 mg nightly, Depakote 250 mg daily and 750 mg nightly.   -pt has blood tracking all the way to sacrum which is definitely related to her pain c/o  Kpad for low back/sacral pain  -added scheduled oxy IR at  0700 and 1200 to help with pain during activity  9/8 well controlled.  4. Mood: Klonopin 0.5 mg nightly as needed.   -remains motivated. Occasionally anxious             -antipsychotic agents: N/A 5. Neuropsych: This patient is capable of making decisions on her own behalf.  -continue ritalin 10mg  bid for concentration 6. Skin/Wound Care: Routine skin checks 7. Fluids/Electrolytes/Nutrition:    -9/6 sodium up to 129  -continue 1200cc FR  -recheck labs on Thursday 8.  Subdural hemorrhage extending throughout the length of the visualized spine mainly dorsal and most notable in the lower thoracic segments and at the L5-S1 level.  Unclear origin  - continue consvt care  9.  Seizure prophylaxis.  Keppra 500 mg twice daily 10.  Hypertension.  Cozaar 75 mg daily.  Monitor with increased mobility. Well controlled  9/6-  BP 100s/50s- con't Cozaar  9/8: BP is soft, asymptomatic 11.  Hyperlipidemia.  Lipitor 12.  Neurogenic bladder.  Urecholine 10 mg 3 times daily.  Check PVR x3- has been retaining- might need Flomax  8/30 100k E coli in urine S to ctx/cephalexin   -9/1: keflex completed  9/7   improvement with urecholine 25mg  tid-    -OOB to void 13.  History of alcohol use. 1-2 drinks/day per pt-  Provide counseling. 14.  Left nondisplaced fracture of left lateral malleolus and nondisplaced fracture of lateral malleolus right fibula sprain of tibiofibular ligament/right ankle after recent fall 06/14/2020.     - WBAT bilateral lower extremities.    -Cam boot on left and lace up ankle brace on right. 15. Neurogenic bowel- incontinent  -should improve as mobility and cognitively she improves   -mag citrate, SSE 9/3---repeat today as had results with this on 9/3  LOS: 13 days A FACE TO [place] 07/21/2020, 9:23 AM

## 2020-07-21 NOTE — Progress Notes (Signed)
Physical Therapy Session Note  Patient Details  Name: Amanda Collier MRN: [id number] Date of Birth: 02-02-1946  Today's Date: 07/21/2020 PT Individual Time: 1415-1530 PT Individual Time Calculation (min): 75 min   Short Term Goals: Week 2:  PT Short Term Goal 1 (Week 2): Patient will perform bed mobility with min A consitently without use of hospital bed features. PT Short Term Goal 2 (Week 2): Patient will tolerate sitting OOB >1 hour each day. PT Short Term Goal 3 (Week 2): Patient will perform basic transfers with min A using LRAD. PT Short Term Goal 4 (Week 2): Patient will ambulate >30 feet using LRAD with min A.  Skilled Therapeutic Interventions/Progress Updates:     Patient in bed upon PT arrival. Patient alert and agreeable to PT session. Patient reported 8/10 back/sacral pain during session, RN made aware. PT provided repositioning, rest breaks, and distraction as pain interventions throughout session.   Therapeutic Activity: Bed Mobility: Patient performed supine to/from sit with supervision using the bed rail. Provided verbal cues for keeping hips and shoulder in line while log rolling to reduce back pain with mobility. Donned/doffed R ankle brace and shoe and L CAM boot with total A with patient sitting EOB with supervision for sitting balance.  Transfers: Patient performed sit to/from stand x2 with CGA and stand pivot x3 with min A for balance and AD management using a RW for transfers. Provided verbal cues using teach back method for hand placement, both LEs against the surface she was going to sit on, and reaching back to control descent.  Gait Training:  Patient ambulated 50 feet and 60 feet using RW with min A for balance and AD management. Ambulated with step-to gait pattern leading with L, decreased step length and height with shuffling gait during turns, and easy distractibility requiring redirection x3 during gait training. Provided verbal cues and facilitation for RW  management due to patient steering to the L and to promote increased step length and gait speed with increased forward movement of the RW, also facilitation L weight shift to promote step-through gait pattern with poor success this session, limited by patient's fixation on CAM boot feeling "unsteady."  Wheelchair Mobility:  Patient was transported in the TIS w/c with total A throughout session for energy conservation and time management.  Neuromuscular Re-ed: Patient performed the following balance and LE motor control activities for improved confidence with balance and functional mobility: -sit to/from stand without AD x4 with min A, focused on forward weight shift, trunk/hip, and knee extension to stand, and controlled descent with mirror in front for internal feedback -standing balance x4 20-65 sec progressing from min A-close supervision/CGA without UE support, focused on forward weight shift to toes due to mild posterior bias and erect posture in standing with mirror in front for internal feedback  Patient very verbose throughout session and distracted by verbosity during activities. Easily redirectable, but unable to sustain focus requiring further redirection intermittently. Patient required increased time with all mobility due to delayed initiation, decreased activity tolerance, and distractibility.   Patient in bed at end of session with breaks locked, bed alarm set, and all needs within reach.    Therapy Documentation Precautions:  Precautions Precautions: Fall Precaution Comments: drains removed Required Braces or Orthoses: Other Brace Other Brace: L cam boot; R lace up ankle brace; has sneaker for R foot Restrictions Weight Bearing Restrictions: No RLE Weight Bearing: Non weight bearing LLE Weight Bearing: Non weight bearing Other Position/Activity Restrictions: WBAT with braces  Therapy/Group: Individual Therapy  Angala Hilgers L Bruce Mayers PT, DPT  07/21/2020, 5:13 PM

## 2020-07-21 NOTE — Progress Notes (Signed)
Occupational Therapy Session Note  Patient Details  Name: Amanda Collier MRN: [id number] Date of Birth: 20-Jul-1946  Today's Date: 07/21/2020 OT Individual Time: 1000-1100 OT Individual Time Calculation (min): 60 min    Short Term Goals: Week 1:  OT Short Term Goal 1 (Week 1): Pt will don shirt wiht MIN A OT Short Term Goal 1 - Progress (Week 1): Met OT Short Term Goal 2 (Week 1): Pt will thread 1LE into pants OT Short Term Goal 2 - Progress (Week 1): Progressing toward goal OT Short Term Goal 3 (Week 1): PT will sit to stnad with MOD A of 1 wiht LRAD in prep for LB dressing OT Short Term Goal 3 - Progress (Week 1): Met OT Short Term Goal 4 (Week 1): Pt will sequence oral care with no VC OT Short Term Goal 4 - Progress (Week 1): Met OT Short Term Goal 5 (Week 1): Pt will initate grooming with no VC OT Short Term Goal 5 - Progress (Week 1): Progressing toward goal  Skilled Therapeutic Interventions/Progress Updates:    1;1. Pt received in bed agreeable to shower and dress this date. Footwear donned throughout for brace/ted application with total A. OT utilized stedy to transfer into/out of bathroom for time management/shower transfer with S at sit to stand level. Pt requires MAX VC for sequencing and attention to bathing body parts and A to wash buttocks and B feet seated on BSC with cutout utilized to wash peri/buttocks. Pt completes UB dressing seated in TIS with VC for finishing each step of task to completion prior to starting next step of UB dressing as pt gets herself stuck. Pt utilizes reacher with MIN A to thread BLE into pants d/t back precautions and MAX VC for opening/closing reacher. Pt sit to stand at sink for OT to advance pant past hips. Pt requires VC for not talking during dressing tasks as pt distracted by discussing renovation of house. Pt grooms at sink with VC for pacing. Exited session with pt seated in bed, exit alarm on and call light inr each  Therapy  Documentation Precautions:  Precautions Precautions: Fall Precaution Comments: drains removed Required Braces or Orthoses: Other Brace Other Brace: L cam boot; R lace up ankle brace; has sneaker for R foot Restrictions Weight Bearing Restrictions: No RLE Weight Bearing: Non weight bearing LLE Weight Bearing: Non weight bearing Other Position/Activity Restrictions: WBAT with braces General:   Vital Signs:   Pain: Pain Assessment Pain Scale: 0-10 Pain Score: 0-No pain ADL: ADL Grooming: Minimal assistance Where Assessed-Grooming: Sitting at sink Upper Body Bathing: Unable to assess Lower Body Bathing: Unable to assess Upper Body Dressing: Maximal assistance Where Assessed-Upper Body Dressing: Edge of bed Lower Body Dressing: Maximal assistance Where Assessed-Lower Body Dressing: Edge of bed, Bed level Toileting: Maximal assistance Where Assessed-Toileting: Bedside Commode Toilet Transfer: Dependent (stedy) Toilet Transfer Equipment: Drop arm bedside commode Vision   Perception    Praxis   Exercises:   Other Treatments:     Therapy/Group: Individual Therapy  Tonny Branch 07/21/2020, 11:00 AM

## 2020-07-22 ENCOUNTER — Inpatient Hospital Stay (HOSPITAL_COMMUNITY): Payer: Medicare Other

## 2020-07-22 ENCOUNTER — Inpatient Hospital Stay (HOSPITAL_COMMUNITY): Payer: Medicare Other | Admitting: Occupational Therapy

## 2020-07-22 ENCOUNTER — Inpatient Hospital Stay (HOSPITAL_COMMUNITY): Payer: Medicare Other | Admitting: Speech Pathology

## 2020-07-22 LAB — BASIC METABOLIC PANEL
Anion gap: 12 (ref 5–15)
BUN: 12 mg/dL (ref 8–23)
CO2: 22 mmol/L (ref 22–32)
Calcium: 8.8 mg/dL — ABNORMAL LOW (ref 8.9–10.3)
Chloride: 96 mmol/L — ABNORMAL LOW (ref 98–111)
Creatinine, Ser: 0.91 mg/dL (ref 0.44–1.00)
GFR calc Af Amer: >60 mL/min (ref >60)
GFR calc non Af Amer: >60 mL/min (ref >60)
Glucose, Bld: 106 mg/dL — ABNORMAL HIGH (ref 70–99)
Potassium: 4 mmol/L (ref 3.5–5.1)
Sodium: 130 mmol/L — ABNORMAL LOW (ref 135–145)

## 2020-07-22 MED ORDER — SENNOSIDES-DOCUSATE SODIUM 8.6-50 MG PO TABS
2.0000 | ORAL_TABLET | Freq: Two times a day (BID) | ORAL | Status: DC
  Administered 2020-07-22 – 2020-07-26 (×8): 2 via ORAL
  Filled 2020-07-22 (×8): qty 2

## 2020-07-22 NOTE — Progress Notes (Signed)
Speech Language Pathology Daily Session Note  Patient Details  Name: Amanda Collier MRN: [id number] Date of Birth: 1945-12-12  Today's Date: 07/22/2020 SLP Individual Time: 1000-1055 SLP Individual Time Calculation (min): 55 min  Short Term Goals: Week 2: SLP Short Term Goal 1 (Week 2): Patient will utilize speech intelligibility strategies at the conversation levle with supervision verbal cues to achieve ~90% intelligibility. SLP Short Term Goal 2 (Week 2): Patient will demonstrate functional problem solving for mildly complex tasks with Min A verbal cues. SLP Short Term Goal 3 (Week 2): Patient will recall new, daily information with Min A verbal and visual cues. SLP Short Term Goal 4 (Week 2): Patient will demonstrate sustained attention to functional tasks for 30 minutes with Mod verbal cues for redirection.  Skilled Therapeutic Interventions: Skilled treatment session focused on cognitive goals. SLP facilitated session by providing overall Min A verbal and visual cues for organization of functional information in order to maximize recall and carryover. SLP provided the patient with a folder to keep her daily schedules and other "medical" papers in as well as structured sheets of paper labeled "today's thoughts" that also has a place for the date for patient to record daily information as well as questions for the physician. Patient initiated use of the sheets to record a question regarding her fluid restriction to ask the physician tomorrow. Patient also verbalized a specific place to keep the folder and notebook for a quick reference. Patient left upright in bed with alarm on and all needs within reach. Continue with current plan of care.       Pain Pain Assessment Pain Scale: 0-10 Pain Score: 4  Pain Type: Chronic pain Pain Location: Coccyx Pain Intervention(s): Medication (See eMAR)  Therapy/Group: Individual Therapy  Amanda Collier 07/22/2020, 12:41 PM

## 2020-07-22 NOTE — Plan of Care (Signed)
  Problem: RH Ambulation Goal: LTG Patient will ambulate in controlled environment (PT) Description: LTG: Patient will ambulate in a controlled environment, # of feet with assistance (PT). Flowsheets (Taken 07/22/2020 0822) LTG: Pt will ambulate in controlled environ  assist needed:: (upgraded goal due to patient's progress with gait training and improved balance.) Contact Guard/Touching assist LTG: Ambulation distance in controlled environment: 150 ft Note: upgraded goal due to patient's progress with gait training and improved balance. Goal: LTG Patient will ambulate in home environment (PT) Description: LTG: Patient will ambulate in home environment, # of feet with assistance (PT). Flowsheets Taken 07/22/2020 0822 by Apolinar Junes L, PT LTG: Pt will ambulate in home environ  assist needed:: (upgraded goal due to patient's progress with gait training and improved balance.) Contact Guard/Touching assist Taken 07/09/2020 1551 by Ginnie Smart P, PT LTG: Ambulation distance in home environment: 31ft Note: upgraded goal due to patient's progress with gait training and improved balance.

## 2020-07-22 NOTE — Progress Notes (Signed)
Physical Therapy Weekly Progress Note  Patient Details  Name: Amanda Collier MRN: [id number] Date of Birth: [birth year]  Beginning of progress report period: July 15, 2020 End of progress report period: July 22, 2020  Today's Date: 07/22/2020 PT Individual Time: 0800-0900 PT Individual Time Calculation (min): 60 min   Patient has met 3 of 4 short term goals.  Patient with steady progress with functional mobility this week. She is performing bed mobility with supervision-min A, transfers min A-CGA with RW, and gait 60 ft using a RW with min A-CGA. She continues to be verbose with decreased attention intermittently, but overall improved focus when redirected. She also continues to present with decreased motor planning and fear of falling as greatest limitations with progress with functional mobility at this time.   Patient continues to demonstrate the following deficits muscle weakness, decreased cardiorespiratoy endurance, impaired timing and sequencing, decreased coordination and decreased motor planning, decreased initiation, decreased attention, decreased problem solving, decreased memory and delayed processing and decreased sitting balance, decreased standing balance, decreased postural control and decreased balance strategies and therefore will continue to benefit from skilled PT intervention to increase functional independence with mobility.  Patient progressing toward long term goals..  Plan of care revisions: Upgraded LTGs: Gait goal upgraded to 150 ft CGA in controlled environment due to progress with gait training..  PT Short Term Goals Week 2:  PT Short Term Goal 1 (Week 2): Patient will perform bed mobility with min A consitently without use of hospital bed features. PT Short Term Goal 1 - Progress (Week 2): Met PT Short Term Goal 2 (Week 2): Patient will tolerate sitting OOB >1 hour each day. PT Short Term Goal 2 - Progress (Week 2): Progressing toward goal PT Short Term  Goal 3 (Week 2): Patient will perform basic transfers with min A using LRAD. PT Short Term Goal 3 - Progress (Week 2): Met PT Short Term Goal 4 (Week 2): Patient will ambulate >30 feet using LRAD with min A. PT Short Term Goal 4 - Progress (Week 2): Met Week 3:  PT Short Term Goal 1 (Week 3): Patient will perform basic transfers with CGA and min cues for sequencing using LRAD. PT Short Term Goal 2 (Week 3): Patient will ambulate >100 feet min A using LRAD. PT Short Term Goal 3 (Week 3): Patient will initate stair training.  Skilled Therapeutic Interventions/Progress Updates:     Patient in bed eating breakfast upon PT arrival. Patient alert and agreeable to PT session. Patient reported 4/10 back and sacral pain during session, RN made aware. PT provided repositioning, rest breaks, and distraction as pain interventions throughout session.   Therapeutic Activity: Bed Mobility: Patient pulled pants over hips in lying with mod A performing bridging technique. She performed supine to sit with min A-CGA in a flat bed without use of bed rail. Provided verbal cues for log rolling for reduced back pain and reaching to grab the edge of bed to roll then set bottom elbow to push to her elbow then her hand to come to sitting. She performed sit to supine with supervision for cues for log rolling in a flat bed without use of a bed rail. Patient sat EOB to finish breakfast with distant supervision. Reviewed patient's goals and progresses and set goals for next week with patient as she ate. Also reviewed patient's chart regarding her B malliolar fractures and R ankle sprain. B TED hose donned prior to session. Donned R ankle brace and L CAM boot  with total A for energy conservation and time management. Transfers: Patient performed stand pivot transfers bed<>TIS w/c with min A for AD management x1 and with CGA x. Utilized teach back method or sequencing and technique, required cues for reaching back to sit  Both trials.  She performed sit to/from stand x2 with CGA using RW and stair rails. Patient utilized teach back method for hand placement and sequencing requiring min A for recall.   Gait Training:  Patient ambulated 50 feet using RW with min A-CGA. Ambulated with step-to gait pattern leading with L, tends to trail R foot behind and shuffle feet with fatigue and/or distraction, requires focal point to look at during gait to maintain attention throughout, did not require cues to attend to gait today, however showed increased fatigue and delayed processing at end of trial. Patient reports that it is challenging to process everything she needs to do while walking. Provided verbal cues for increased R step length and height, and manual A for RW, decreased assist from yesterday, but continues to veer slightly L and require assist on turns. Patient ascended descended 1-6" step x3 and 2-6" steps x1 using B rails with min A-CGA. Performed step-to gait pattern leading with R while ascending and L while descending. Provided cues for technique and sequencing.   Wheelchair Mobility:  Patient was transported in the w/c with total A throughout session for energy conservation and time management.  Patient in bed at end of session with breaks locked, bed alarm set, and all needs within reach.    Therapy Documentation Precautions:  Precautions Precautions: Fall Precaution Comments: drains removed Required Braces or Orthoses: Other Brace Other Brace: L cam boot; R lace up ankle brace; has sneaker for R foot Restrictions Weight Bearing Restrictions: No RLE Weight Bearing: Non weight bearing LLE Weight Bearing: Non weight bearing Other Position/Activity Restrictions: WBAT with braces  Therapy/Group: Individual Therapy  Leona Pressly L Phoenix Dresser PT, DPT  07/22/2020, 12:23 PM

## 2020-07-22 NOTE — Progress Notes (Signed)
Occupational Therapy Session Note  Patient Details  Name: Amanda Collier MRN: [id number] Date of Birth: 07/29/1946  Today's Date: 07/22/2020 OT Individual Time: 7846-9629 OT Individual Time Calculation (min): 59 min    Short Term Goals: Week 2:  OT Short Term Goal 1 (Week 2): Pt will complete oral care with set up OT Short Term Goal 2 (Week 2): Pt will transfer to toilet with MIN A and LRAD OT Short Term Goal 3 (Week 2): Pt will thread 1LE into pants with AE PRN OT Short Term Goal 4 (Week 2): Pt will bathe UB with no VC for sequencing  Skilled Therapeutic Interventions/Progress Updates:    Pt greeted at time of session supine in bed resting agreeable to OT session, supine to sit EOB with supervision and donned braces total A with help of therapist. Sit to stand from EOB with Min/CGA to RW, side step L/R at side of bed with pt taking small steps, requiring verbal cues for when to move L/R foot.Transferred via [place] per pt request to [place] chair, with pt CGA/Min for sit to stand from bed level. Transported to gym via wheelchair for time management, performed [employer] seated for trial run to increase comfort and understand directions before performing in standing for 5 rounds of 1 minute intervals, with varying levels of difficulty including red/green lights, using different hands, remembering directions, etc. With pt using correct hand for colors with 100% accuracy and able to stand with unilateral support. Rest breaks in between sets. SPT back to bed with CGA, Max verbal cues for moving L/R foot in correct direction for problem solving and sequencing. Sit to supine Supervision. Alarm on, call bell in reach.   Therapy Documentation Precautions:  Precautions Precautions: Fall Precaution Comments: drains removed Required Braces or Orthoses: Other Brace Other Brace: L cam boot; R lace up ankle brace; has sneaker for R foot Restrictions Weight Bearing Restrictions: No RLE Weight Bearing: Non  weight bearing LLE Weight Bearing: Non weight bearing Other Position/Activity Restrictions: WBAT with braces     Therapy/Group: Individual Therapy  Viona Gilmore 07/22/2020, 3:17 PM

## 2020-07-22 NOTE — Progress Notes (Signed)
[place] PHYSICAL MEDICINE & REHABILITATION PROGRESS NOTE   Subjective/Complaints: Feels better after large BM yesterday. No new complaints. Asked about WB in bilateral legs, use of brace/boot  ROS: Patient denies fever, rash, sore throat, blurred vision, nausea, vomiting, diarrhea, cough, shortness of breath or chest pain, headache, or mood change.    Objective:   No results found. No results for input(s): WBC, HGB, HCT, PLT in the last 72 hours. No results for input(s): NA, K, CL, CO2, GLUCOSE, BUN, CREATININE, CALCIUM in the last 72 hours.  Intake/Output Summary (Last 24 hours) at 07/22/2020 1107 Last data filed at 07/22/2020 0800 Gross per 24 hour  Intake 360 ml  Output --  Net 360 ml     Physical Exam: Vital Signs Blood pressure 118/68, pulse 73, temperature 98.2 F (36.8 C), resp. rate 17, height 5\' 4"  (1.626 m), weight 74.8 kg, last menstrual period 11/14/2003, SpO2 95 %. Constitutional: No distress . Vital signs reviewed. HEENT: EOMI, oral membranes moist Neck: supple Cardiovascular: RRR without murmur. No JVD    Respiratory/Chest: CTA Bilaterally without wheezes or rales. Normal effort    GI/Abdomen: BS +, non-tender, non-distended Ext: no clubbing, cyanosis, or edema Psych: pleasant and cooperative Musculoskeletal:   -pain along low back into sacrum ongoing Skin:    Crani incisions CDI bilaterally Neurological:     Comments: alert and oriented. Focus better.  Light touch intact in all 4 extremities- moves all 4's.  Motor: 4+/5 in UE. LE 3-/5 prox to 4-/5 distally with ongoing pain inhibition in both legs still present   Assessment/Plan: 1. Functional deficits secondary to traumatic subdural hematoma which require 3+ hours per day of interdisciplinary therapy in a comprehensive inpatient rehab setting.  Physiatrist is providing close team supervision and 24 hour management of active medical problems listed below.  Physiatrist and rehab team continue to assess  barriers to discharge/monitor patient progress toward functional and medical goals  Care Tool:  Bathing  Bathing activity did not occur: Safety/medical concerns (Per MD orders ) Body parts bathed by patient: Right arm, Left arm, Chest, Abdomen, Right upper leg, Left upper leg, Face   Body parts bathed by helper: Right lower leg, Left lower leg     Bathing assist Assist Level: Moderate Assistance - Patient 50 - 74%     Upper Body Dressing/Undressing Upper body dressing   What is the patient wearing?: Pull over shirt    Upper body assist Assist Level: Minimal Assistance - Patient > 75%    Lower Body Dressing/Undressing Lower body dressing      What is the patient wearing?: Pants     Lower body assist Assist for lower body dressing: Moderate Assistance - Patient 50 - 74%     Toileting Toileting    Toileting assist Assist for toileting: Total Assistance - Patient < 25%     Transfers Chair/bed transfer  Transfers assist     Chair/bed transfer assist level: Minimal Assistance - Patient > 75% Chair/bed transfer assistive device: Programmer, multimedia   Ambulation assist   Ambulation activity did not occur: Safety/medical concerns (symptomatic hypotension)  Assist level: Minimal Assistance - Patient > 75% Assistive device: Walker-rolling Max distance: 60 ft   Walk 10 feet activity   Assist  Walk 10 feet activity did not occur: Safety/medical concerns  Assist level: Minimal Assistance - Patient > 75% Assistive device: Walker-rolling   Walk 50 feet activity   Assist Walk 50 feet with 2 turns activity did not occur:  Safety/medical concerns  Assist level: Minimal Assistance - Patient > 75% Assistive device: Walker-rolling    Walk 150 feet activity   Assist Walk 150 feet activity did not occur: Safety/medical concerns         Walk 10 feet on uneven surface  activity   Assist Walk 10 feet on uneven surfaces activity did not occur:  Safety/medical concerns         Wheelchair     Assist Will patient use wheelchair at discharge?: No   Wheelchair activity did not occur: Safety/medical concerns         Wheelchair 50 feet with 2 turns activity    Assist    Wheelchair 50 feet with 2 turns activity did not occur: Safety/medical concerns       Wheelchair 150 feet activity     Assist  Wheelchair 150 feet activity did not occur: Safety/medical concerns       Blood pressure 118/68, pulse 73, temperature 98.2 F (36.8 C), resp. rate 17, height 5\' 4"  (1.626 m), weight 74.8 kg, last menstrual period 11/14/2003, SpO2 95 %.  Medical Problem List and Plan: 1.  Decreased functional ability with gait abnormality secondary to large subacute subdural hematoma bilaterally with significant mass-effect.  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             -needs SLP for poor focus and tangential behavior             -patient may shower-             -ELOS/Goals: 08/04/20- goals Supervision to min A  -continue CIR  2.  Antithrombotics: -DVT/anticoagulation: Subcutaneous heparin.  vascular study 8/27 shows age indeterminate clot left posterior tib vein. 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   -repeat u/s next week             -antiplatelet therapy: Aspirin 81 mg twice daily 3. Pain Management/headaches: Hydrocodone scheduled 10mg  q6H since she forgets to ask for pain medications, Topamax 75 mg nightly, Depakote 250 mg daily and 750 mg nightly.   -pt has blood tracking all the way to sacrum which is definitely related to her pain c/o  Kpad for low back/sacral pain  -added scheduled oxy IR at 0700 and 1200 to help with pain during activity  9/9 well controlled.  4. Mood: Klonopin 0.5 mg nightly as needed.   -remains motivated. Occasionally anxious             -antipsychotic agents: N/A 5.  Neuropsych: This patient is capable of making decisions on her own behalf.  -continue ritalin 10mg  bid for concentration 6. Skin/Wound Care: Routine skin checks 7. Fluids/Electrolytes/Nutrition:    -9/6 sodium up to 129  -continue 1200cc FR  9/9 labs still pending today! 8.  Subdural hemorrhage extending throughout the length of the visualized spine mainly dorsal and most notable in the lower thoracic segments and at the L5-S1 level.  Unclear origin  - continue consvt care  9.  Seizure prophylaxis.  Keppra 500 mg twice daily 10.  Hypertension.  Cozaar 75 mg daily.  Monitor with increased mobility. Well controlled  9/6- BP 100s/50s- con't Cozaar  9/8: BP is soft, asymptomatic 11.  Hyperlipidemia.  Lipitor 12.  Neurogenic bladder.  Urecholine 10 mg 3 times daily.  Check PVR x3- has been retaining- might need Flomax  8/30 100k E coli in urine S to ctx/cephalexin   -  9/1: keflex completed  9/7   improvement with urecholine 25mg  tid-    -OOB to void 13.  History of alcohol use. 1-2 drinks/day per pt-  Provide counseling. 14.  Left nondisplaced fracture of left lateral malleolus and nondisplaced fracture of lateral malleolus right fibula sprain of tibiofibular ligament/right ankle after recent fall 06/14/2020.     - WBAT bilateral lower extremities.    -Cam boot on left and lace up ankle brace on right.  -we should be able to liberate her from the braces but have checked with ortho first. 15. Neurogenic bowel- incontinent  -should improve as mobility and cognitively she improves   -mag citrate, SSE with results  -increase daily regimen to senkot-s 2 tabs bid  LOS: 14 days A FACE TO FACE EVALUATION WAS PERFORMED  Meredith Staggers 07/22/2020, 11:07 AM

## 2020-07-23 ENCOUNTER — Inpatient Hospital Stay (HOSPITAL_COMMUNITY): Payer: Medicare Other | Admitting: Occupational Therapy

## 2020-07-23 ENCOUNTER — Inpatient Hospital Stay (HOSPITAL_COMMUNITY): Payer: Medicare Other

## 2020-07-23 ENCOUNTER — Inpatient Hospital Stay (HOSPITAL_COMMUNITY): Payer: Medicare Other | Admitting: Speech Pathology

## 2020-07-23 MED ORDER — LOSARTAN POTASSIUM 50 MG PO TABS
50.0000 mg | ORAL_TABLET | Freq: Every day | ORAL | Status: DC
  Administered 2020-07-24 – 2020-07-31 (×8): 50 mg via ORAL
  Filled 2020-07-23 (×8): qty 1

## 2020-07-23 NOTE — Progress Notes (Signed)
Physical Therapy Session Note  Patient Details  Name: Amanda Collier MRN: [id number] Date of Birth: 07-10-1946  Today's Date: 07/23/2020 PT Individual Time: 6333-5456 PT Individual Time Calculation (min): 60 min   Short Term Goals: Week 1:  PT Short Term Goal 1 (Week 1): Pt will perform bed mobility with modA PT Short Term Goal 1 - Progress (Week 1): Met PT Short Term Goal 2 (Week 1): Pt will maintain sitting EOB >5 minutes with CGA PT Short Term Goal 2 - Progress (Week 1): Met PT Short Term Goal 3 (Week 1): Pt will perform bed<>chair transfer with modA and LRAD PT Short Term Goal 3 - Progress (Week 1): Met PT Short Term Goal 4 (Week 1): Pt will initiate pre-gait and standing activities with modA and LRAD PT Short Term Goal 4 - Progress (Week 1): Met Week 2:  PT Short Term Goal 1 (Week 2): Patient will perform bed mobility with min A consitently without use of hospital bed features. PT Short Term Goal 1 - Progress (Week 2): Met PT Short Term Goal 2 (Week 2): Patient will tolerate sitting OOB >1 hour each day. PT Short Term Goal 2 - Progress (Week 2): Progressing toward goal PT Short Term Goal 3 (Week 2): Patient will perform basic transfers with min A using LRAD. PT Short Term Goal 3 - Progress (Week 2): Met PT Short Term Goal 4 (Week 2): Patient will ambulate >30 feet using LRAD with min A. PT Short Term Goal 4 - Progress (Week 2): Met Week 3:  PT Short Term Goal 1 (Week 3): Patient will perform basic transfers with CGA and min cues for sequencing using LRAD. PT Short Term Goal 2 (Week 3): Patient will ambulate >100 feet min A using LRAD. PT Short Term Goal 3 (Week 3): Patient will initate stair training.  Skilled Therapeutic Interventions/Progress Updates:    PAIN  Pt initially supine and agreeable to session w/focus on am ADLs, gait pattern, weight shifting.  Pt dons pants to hips in supine w/therapist assist to thread pants, pt raises to hips.  Supine to side to sit w/min  assist. Ankle brace, shoe, and Cam boot applied by therapist.   Removes nightshirt in sitting w/supervision.  Dons bra and clean shirt w/set up, total assist to fasten clasp of bra. STS w/mod assist from low bed, raises pants w/max asssist.  SPT to wc w/RW and mod assist, very shuffled steps w/turning.  Pt transported to gym for continued session.   STS from wc w/cues for hand placement, sequencing, some paucity of movement noted, pt states "I am thinking".  Gait 76f w/RW and min assist, very short step length B but emphasized on R.  Pt w/much difficulty despite verbal cues and self cueing.     NMRE: In standing w/RW worked on stepping increasing distances w/RLE to numbered targets placed on floor to simulate normal step length. Repeated 4 sets of 10 w/seated rest between efforts. Then transitioned to gait x 511fw/RW using advancing visual target on floor to promote increased step length on R.  Pt able to achieve normal step length approx 20% of time, stepped to target approx 50% time but then retreacted foot back/aware she was doing but unable to control, shuffles 30%5ime.   May benefit from cast shoe on R to also equalize height diff created by [place].  Pt transported back to room at end of session.  Pt left oob in wc w/alarm belt set and needs in reach  Therapy Documentation Precautions:  Precautions Precautions: Fall Precaution Comments: drains removed Required Braces or Orthoses: Other Brace Other Brace: L cam boot; R lace up ankle brace; has sneaker for R foot Restrictions Weight Bearing Restrictions: No RLE Weight Bearing: Non weight bearing LLE Weight Bearing: Non weight bearing Other Position/Activity Restrictions: WBAT with braces    Therapy/Group: Individual Therapy  Amanda Collier, PT   Amanda Collier 07/23/2020, 12:46 PM

## 2020-07-23 NOTE — Progress Notes (Signed)
Physical Therapy Session Note  Patient Details  Name: Amanda Collier MRN: [id number] Date of Birth: 1945-11-23  Today's Date: 07/23/2020 PT Individual Time: 8882-8003 PT Individual Time Calculation (min): 25 min   Short Term Goals: Week 2:  PT Short Term Goal 1 (Week 2): Patient will perform bed mobility with min A consitently without use of hospital bed features. PT Short Term Goal 1 - Progress (Week 2): Met PT Short Term Goal 2 (Week 2): Patient will tolerate sitting OOB >1 hour each day. PT Short Term Goal 2 - Progress (Week 2): Progressing toward goal PT Short Term Goal 3 (Week 2): Patient will perform basic transfers with min A using LRAD. PT Short Term Goal 3 - Progress (Week 2): Met PT Short Term Goal 4 (Week 2): Patient will ambulate >30 feet using LRAD with min A. PT Short Term Goal 4 - Progress (Week 2): Met Week 3:  PT Short Term Goal 1 (Week 3): Patient will perform basic transfers with CGA and min cues for sequencing using LRAD. PT Short Term Goal 2 (Week 3): Patient will ambulate >100 feet min A using LRAD. PT Short Term Goal 3 (Week 3): Patient will initate stair training.  Skilled Therapeutic Interventions/Progress Updates:   Received pt supine in bed, pt agreeable to therapy, and reported pain 4/10 on tailbone. Repositioning and distraction done to reduce pain levels. Session with emphasis on functional mobility/transfers, generalized strengthening, dynamic standing balance/coordinaiton, attention, and improved activity tolerance. Pt continues to remain verbose throughout session and requires min cues for redirection. Pt required increased time with mobility due to decreased attention as pt is easily distracted. Pt performed bed mobility with supervision and use of bedrails x 2 trials throughout session. Donned L cam boot and R lace up ankle brace sitting EOB with total A. Pt transferred sit<>stand with RW and min A x 3 trials and performed the following exercises with  bilateral UE support on RW, CGA, and verbal/visual cues for technique: -marching x15 bilaterally -hip abduction x10 bilaterally Doffed L cam boot and R lace up ankle brace total A and pt transferred sit<>supine with supervision. Concluded session with pt supine in bed, needs within reach, and bed alarm on.   Therapy Documentation Precautions:  Precautions Precautions: Fall Precaution Comments: drains removed Required Braces or Orthoses: Other Brace Other Brace: L cam boot; R lace up ankle brace; has sneaker for R foot Restrictions Weight Bearing Restrictions: No RLE Weight Bearing: Non weight bearing LLE Weight Bearing: Non weight bearing Other Position/Activity Restrictions: WBAT with braces   Therapy/Group: Individual Therapy Jazlynne Milliner PT, DPT   07/23/2020, 7:35 AM

## 2020-07-23 NOTE — Progress Notes (Signed)
Occupational Therapy Weekly Progress Note  Patient Details  Name: Amanda Collier MRN: [id number] Date of Birth: Oct 05, 1946  Beginning of progress report period: July 16, 2020 End of progress report period: July 23, 2020  Today's Date: 07/23/2020 OT Individual Time: 0932-6712 OT Individual Time Calculation (min): 61 min    Patient has met 3 of 4 short term goals.  Pt is making steady progress towards goals.  Pt currently requires Min assist for stand pivot transfers with RW, however requires increased encouragement to attempt as pt very fearful of falling.  Pt is demonstrating improved sequencing and organization during self-care tasks, continues to benefit from focus on cognition in ADL and IADL tasks.    Patient continues to demonstrate the following deficits: muscle weakness, decreased cardiorespiratoy endurance, impaired timing and sequencing, unbalanced muscle activation, motor apraxia, decreased coordination and decreased motor planning, decreased visual perceptual skills, decreased motor planning, decreased initiation, decreased attention, decreased awareness, decreased problem solving, decreased safety awareness, decreased memory and delayed processing, central origin and decreased sitting balance, decreased standing balance, decreased postural control, decreased balance strategies and difficulty maintaining precautions  and therefore will continue to benefit from skilled OT intervention to enhance overall performance with BADL and Reduce care partner burden.  Patient progressing toward long term goals..  Continue plan of care.  OT Short Term Goals Week 2:  OT Short Term Goal 1 (Week 2): Pt will complete oral care with set up OT Short Term Goal 1 - Progress (Week 2): Met OT Short Term Goal 2 (Week 2): Pt will transfer to toilet with MIN A and LRAD OT Short Term Goal 2 - Progress (Week 2): Met OT Short Term Goal 3 (Week 2): Pt will thread 1LE into pants with AE PRN OT Short  Term Goal 3 - Progress (Week 2): Progressing toward goal OT Short Term Goal 4 (Week 2): Pt will bathe UB with no VC for sequencing OT Short Term Goal 4 - Progress (Week 2): Met Week 3:  OT Short Term Goal 1 (Week 3): Pt will thread 1LE into pants with AE PRN OT Short Term Goal 2 (Week 3): Pt will complete toilet transfers with CGA with LRAD 3 out of 4 trials OT Short Term Goal 3 (Week 3): Pt will complete 2 grooming tasks in standing with CGA to demonstrate increased standing balance  Skilled Therapeutic Interventions/Progress Updates:    Treatment session with focus on functional transfers, sit > stand, dynamic standing balance, and cognitive function during tasks.  Pt received seated EOB in handoff from staff.  Pt requested to utilize [employer] for transfer OOB.  Therapist donned Rt ankle brace and CAM boot to Lt foot prior to transfer.  Pt completed sit > stand CGA in to [place].  Transferred to w/c and positioned w/c at sink.  Pt completed oral care and UB dressing with only setup assist and no cues for sequencing, min cues for problem solving as shirt inside out when attempting to don.  Engaged in sit > stand from w/c at high-low table with focus on anterior weight shift, therapist providing cues 50% of time for hand placement with sit > stand.  Engaged in cognitive grocery task with focus on sorting and categorizing food items while incorporating functional reach and weight shifting.  Engaged in pattern replication with "key" of color bank to increase challenge with interpreting key and replicating pattern.  Pt required mod question cues for awareness of errors.  Returned to room and completed short ambulatory transfer to  bed with RW and min assist.  Pt returned to semi-reclined in bed and left with all needs in reach.  Therapy Documentation Precautions:  Precautions Precautions: Fall Precaution Comments: drains removed Required Braces or Orthoses: Other Brace Other Brace: L cam boot; R lace up ankle  brace; has sneaker for R foot Restrictions Weight Bearing Restrictions: No Other Position/Activity Restrictions: WBAT with braces Pain:  Pt with no c/o pain   Therapy/Group: Individual Therapy  Simonne Come 07/23/2020, 12:24 PM

## 2020-07-23 NOTE — Plan of Care (Signed)
  Problem: Consults Goal: RH BRAIN INJURY PATIENT EDUCATION Description: Description: See Patient Education module for eduction specifics Outcome: Progressing Goal: Skin Care Protocol Initiated - if Braden Score 18 or less Description: If consults are not indicated, leave blank or document N/A Outcome: Progressing Goal: Nutrition Consult-if indicated Outcome: Progressing   Problem: RH BOWEL ELIMINATION Goal: RH STG MANAGE BOWEL WITH ASSISTANCE Description: STG Manage Bowel with mod I Assistance. Outcome: Progressing Goal: RH STG MANAGE BOWEL W/MEDICATION W/ASSISTANCE Description: STG Manage Bowel with Medication with mod I Assistance. Outcome: Progressing   Problem: RH BLADDER ELIMINATION Goal: RH STG MANAGE BLADDER WITH ASSISTANCE Description: STG Manage Bladder With mod I Assistance Outcome: Progressing Goal: RH STG MANAGE BLADDER WITH MEDICATION WITH ASSISTANCE Description: STG Manage Bladder With Medication With mod I Assistance. Outcome: Progressing   Problem: RH SKIN INTEGRITY Goal: RH STG SKIN FREE OF INFECTION/BREAKDOWN Description: Pt will be free of skin breakdown/infection prior to DC with min assist.  Outcome: Progressing Goal: RH STG ABLE TO PERFORM INCISION/WOUND CARE W/ASSISTANCE Description: STG Able To Perform Incision/Wound Care With min Assistance. Outcome: Progressing   Problem: RH SAFETY Goal: RH STG ADHERE TO SAFETY PRECAUTIONS W/ASSISTANCE/DEVICE Description: STG Adhere to Safety Precautions With cues/reminders with AD Outcome: Progressing   Problem: RH COGNITION-NURSING Goal: RH STG USES MEMORY AIDS/STRATEGIES W/ASSIST TO PROBLEM SOLVE Description: STG Uses Memory Aids/Strategies With cues/reminders Assistance to Problem Solve. Outcome: Progressing   Problem: RH PAIN MANAGEMENT Goal: RH STG PAIN MANAGED AT OR BELOW PT'S PAIN GOAL Description: Less than 3 on 0-10 scale Outcome: Progressing   Problem: RH KNOWLEDGE DEFICIT BRAIN INJURY Goal: RH  STG INCREASE KNOWLEDGE OF SELF CARE AFTER BRAIN INJURY Description: Pt will be able to demonstrate understanding of BI teaching, safety precautions, falls, and medication management with supervision assist using handouts/booklets prior to DC.  Outcome: Progressing   Problem: Consults Goal: RH BRAIN INJURY PATIENT EDUCATION Description: Description: See Patient Education module for eduction specifics Outcome: Progressing

## 2020-07-23 NOTE — Progress Notes (Signed)
[place] PHYSICAL MEDICINE & REHABILITATION PROGRESS NOTE   Subjective/Complaints: Asks what she is on for her bladder and what it does. Na 130 Has some tailbone pain. She asks what this is and what she can do for this. Says she has good pain tolerance.   ROS: Patient denies  fever, rash, sore throat, blurred vision, nausea, vomiting, diarrhea, cough, shortness of breath or chest pain, headache, or mood change.    Objective:   No results found. No results for input(s): WBC, HGB, HCT, PLT in the last 72 hours. Recent Labs    07/22/20 1201  NA 130*  K 4.0  CL 96*  CO2 22  GLUCOSE 106*  BUN 12  CREATININE 0.91  CALCIUM 8.8*    Intake/Output Summary (Last 24 hours) at 07/23/2020 0852 Last data filed at 07/23/2020 0715 Gross per 24 hour  Intake 340 ml  Output --  Net 340 ml     Physical Exam: Vital Signs Blood pressure 116/74, pulse 74, temperature 98.5 F (36.9 C), resp. rate 16, height 5\' 4"  (1.626 m), weight 74.8 kg, last menstrual period 11/14/2003, SpO2 97 %. General: Alert and oriented x 3, No apparent distress HEENT: Head is normocephalic, atraumatic, PERRLA, EOMI, sclera anicteric, oral mucosa pink and moist, dentition intact, ext ear canals clear,  Neck: Supple without JVD or lymphadenopathy Heart: Reg rate and rhythm. No murmurs rubs or gallops Chest: CTA bilaterally without wheezes, rales, or rhonchi; no distress Abdomen: Soft, non-tender, non-distended, bowel sounds positive. Extremities: No clubbing, cyanosis, or edema. Pulses are 2+ Skin: Clean and intact without signs of breakdown Musculoskeletal:   -pain along low back into sacrum ongoing Skin:    Crani incisions CDI bilaterally Neurological:     Comments: alert and oriented. Focus better.  Light touch intact in all 4 extremities- moves all 4's.  Motor: 4+/5 in UE. LE 3-/5 prox to 4-/5 distally with ongoing pain inhibition in both legs still present    Assessment/Plan: 1. Functional deficits  secondary to traumatic subdural hematoma which require 3+ hours per day of interdisciplinary therapy in a comprehensive inpatient rehab setting.  Physiatrist is providing close team supervision and 24 hour management of active medical problems listed below.  Physiatrist and rehab team continue to assess barriers to discharge/monitor patient progress toward functional and medical goals  Care Tool:  Bathing  Bathing activity did not occur: Safety/medical concerns (Per MD orders ) Body parts bathed by patient: Right arm, Left arm, Chest, Abdomen, Right upper leg, Left upper leg, Face   Body parts bathed by helper: Right lower leg, Left lower leg     Bathing assist Assist Level: Moderate Assistance - Patient 50 - 74%     Upper Body Dressing/Undressing Upper body dressing   What is the patient wearing?: Pull over shirt    Upper body assist Assist Level: Minimal Assistance - Patient > 75%    Lower Body Dressing/Undressing Lower body dressing      What is the patient wearing?: Pants     Lower body assist Assist for lower body dressing: Moderate Assistance - Patient 50 - 74%     Toileting Toileting    Toileting assist Assist for toileting: Total Assistance - Patient < 25%     Transfers Chair/bed transfer  Transfers assist     Chair/bed transfer assist level: Contact Guard/Touching assist Chair/bed transfer assistive device: Walker   Locomotion Ambulation   Ambulation assist   Ambulation activity did not occur: Safety/medical concerns (symptomatic hypotension)  Assist  level: Minimal Assistance - Patient > 75% Assistive device: Walker-rolling Max distance: 50 ft   Walk 10 feet activity   Assist  Walk 10 feet activity did not occur: Safety/medical concerns  Assist level: Contact Guard/Touching assist Assistive device: Walker-rolling   Walk 50 feet activity   Assist Walk 50 feet with 2 turns activity did not occur: Safety/medical concerns  Assist level:  Contact Guard/Touching assist Assistive device: Walker-rolling    Walk 150 feet activity   Assist Walk 150 feet activity did not occur: Safety/medical concerns         Walk 10 feet on uneven surface  activity   Assist Walk 10 feet on uneven surfaces activity did not occur: Safety/medical concerns         Wheelchair     Assist Will patient use wheelchair at discharge?: No   Wheelchair activity did not occur: Safety/medical concerns         Wheelchair 50 feet with 2 turns activity    Assist    Wheelchair 50 feet with 2 turns activity did not occur: Safety/medical concerns       Wheelchair 150 feet activity     Assist  Wheelchair 150 feet activity did not occur: Safety/medical concerns       Blood pressure 116/74, pulse 74, temperature 98.5 F (36.9 C), resp. rate 16, height 5\' 4"  (1.626 m), weight 74.8 kg, last menstrual period 11/14/2003, SpO2 97 %.  Medical Problem List and Plan: 1.  Decreased functional ability with gait abnormality secondary to large subacute subdural hematoma bilaterally with significant mass-effect.  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             -needs SLP for poor focus and tangential behavior             -patient may shower-             -ELOS/Goals: 08/04/20- goals Supervision to min A  -Continue CIR  2.  Antithrombotics: -DVT/anticoagulation: Subcutaneous heparin.  vascular study 8/27 shows age indeterminate clot left posterior tib vein. 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   -repeat u/s next week             -antiplatelet therapy: Aspirin 81 mg twice daily 3. Pain Management/headaches: Hydrocodone scheduled 10mg  q6H since she forgets to ask for pain medications, Topamax 75 mg nightly, Depakote 250 mg daily and 750 mg nightly.   -pt has blood tracking all the way to sacrum which is definitely  related to her pain c/o  Kpad for low back/sacral pain  -added scheduled oxy IR at 0700 and 1200 to help with pain during activity  9/10: has lower lumbar spine pain. Advised that subdural hemorrhage extends down to L5/S1. Advised ice. Ordered.  4. Mood: Klonopin 0.5 mg nightly as needed.   -remains motivated. Occasionally anxious             -antipsychotic agents: N/A 5. Neuropsych: This patient is capable of making decisions on her own behalf.  -continue ritalin 10mg  bid for concentration 6. Skin/Wound Care: Routine skin checks 7. Fluids/Electrolytes/Nutrition:    -9/6 sodium up to 129  9/10: Na up to 130.   -continue 1200cc FR  9/9 labs still pending today! 8.  Subdural hemorrhage extending throughout the length of the visualized spine mainly dorsal and most notable in the lower thoracic segments and at the L5-S1 level.  Unclear origin  -  continue consvt care  9.  Seizure prophylaxis.  Keppra 500 mg twice daily 10.  Hypertension.  Cozaar 75 mg daily.  Monitor with increased mobility. Well controlled  9/6- BP 100s/50s- con't Cozaar  9/8: BP is soft, asymptomatic  9/10: BP continues to be soft. Decrease Cozaar to 50mg .  11.  Hyperlipidemia.  Lipitor 12.  Neurogenic bladder.  Urecholine 10 mg 3 times daily.  Check PVR x3- has been retaining- might need Flomax  8/30 100k E coli in urine S to ctx/cephalexin   -9/1: keflex completed  9/7   improvement with urecholine 25mg  tid-    -OOB to void 13.  History of alcohol use. 1-2 drinks/day per pt-  Provide counseling. 14.  Left nondisplaced fracture of left lateral malleolus and nondisplaced fracture of lateral malleolus right fibula sprain of tibiofibular ligament/right ankle after recent fall 06/14/2020.     - WBAT bilateral lower extremities.    -Cam boot on left and lace up ankle brace on right.  -we should be able to liberate her from the braces but have checked with ortho first. 15. Neurogenic bowel- incontinent  -should improve as  mobility and cognitively she improves   -mag citrate, SSE with results  -increase daily regimen to senkot-s 2 tabs bid  LOS: 15 days A FACE TO FACE EVALUATION WAS PERFORMED  Constance Hackenberg P Penni Penado 07/23/2020, 8:52 AM

## 2020-07-23 NOTE — Progress Notes (Signed)
Speech Language Pathology Weekly Progress and Session Note  Patient Details  Name: Amanda Collier MRN: [id number] Date of Birth: November 05, 1946  Beginning of progress report period: July 16, 2020 End of progress report period: July 23, 2020  Today's Date: 07/23/2020 SLP Individual Time: 1320-1415 SLP Individual Time Calculation (min): 55 min  Short Term Goals: Week 2: SLP Short Term Goal 1 (Week 2): Patient will utilize speech intelligibility strategies at the conversation levle with supervision verbal cues to achieve ~90% intelligibility. SLP Short Term Goal 1 - Progress (Week 2): Met SLP Short Term Goal 2 (Week 2): Patient will demonstrate functional problem solving for mildly complex tasks with Min A verbal cues. SLP Short Term Goal 2 - Progress (Week 2): Met SLP Short Term Goal 3 (Week 2): Patient will recall new, daily information with Min A verbal and visual cues. SLP Short Term Goal 3 - Progress (Week 2): Met SLP Short Term Goal 4 (Week 2): Patient will demonstrate sustained attention to functional tasks for 30 minutes with Mod verbal cues for redirection. SLP Short Term Goal 4 - Progress (Week 2): Met    New Short Term Goals: Week 3: SLP Short Term Goal 1 (Week 3): Patient will demonstrate functional problem solving for mildly complex tasks with Supervision verbal cues. SLP Short Term Goal 2 (Week 3): Patient will recall new, daily information with Supervision verbal and visual cues. SLP Short Term Goal 3 (Week 3): Patient will demonstrate sustained attention to functional tasks for 45 minutes with Min verbal cues for redirection.  Weekly Progress Updates: Patient continues to make functional gains and has met 4 of 4 STGs this reporting period. Patient is 100% intelligible at the conversation level with overall mod I for use of speech intelligibility strategies. Patient also requires overall Min A verbal cues to complete functional and mildly complex tasks safely in regards  to problem solving, recall and attention. Patient and family education ongoing. Patient would benefit from continued skilled SLP intervention to maximize her cognitive functioning and overall functional independence prior to discharge.      Intensity: Minumum of 1-2 x/day, 30 to 90 minutes Frequency: 3 to 5 out of 7 days Duration/Length of Stay: 08/04/20 Treatment/Interventions: Cognitive remediation/compensation;Therapeutic Activities;Environmental controls;Cueing hierarchy;Functional tasks;Patient/family education;Internal/external aids   Daily Session  Skilled Therapeutic Interventions: Skilled treatment session focused on cognitive goals. SLP facilitated session by providing Mod A verbal cues and more than a reasonable amount of time for problem solving and use of memory strategies during a mildly complex budget task. Patient also appeared to have difficulty operating the calculator and asked the clinician, "where is the comma on this keyboard."  Patient left upright in bed with alarm on and all needs within reach. Continue with current plan of care.      Pain No/Denies Pain   Therapy/Group: Individual Therapy  Zalma Channing 07/23/2020, 6:37 AM

## 2020-07-24 DIAGNOSIS — E871 Hypo-osmolality and hyponatremia: Secondary | ICD-10-CM

## 2020-07-24 DIAGNOSIS — K592 Neurogenic bowel, not elsewhere classified: Secondary | ICD-10-CM

## 2020-07-24 DIAGNOSIS — I10 Essential (primary) hypertension: Secondary | ICD-10-CM

## 2020-07-24 DIAGNOSIS — D62 Acute posthemorrhagic anemia: Secondary | ICD-10-CM

## 2020-07-24 DIAGNOSIS — S065X1D Traumatic subdural hemorrhage with loss of consciousness of 30 minutes or less, subsequent encounter: Secondary | ICD-10-CM

## 2020-07-24 DIAGNOSIS — N319 Neuromuscular dysfunction of bladder, unspecified: Secondary | ICD-10-CM

## 2020-07-24 DIAGNOSIS — I82542 Chronic embolism and thrombosis of left tibial vein: Secondary | ICD-10-CM

## 2020-07-24 MED ORDER — LEVETIRACETAM 250 MG PO TABS
250.0000 mg | ORAL_TABLET | Freq: Two times a day (BID) | ORAL | Status: DC
  Administered 2020-07-24 – 2020-08-03 (×20): 250 mg via ORAL
  Filled 2020-07-24 (×20): qty 1

## 2020-07-24 NOTE — Plan of Care (Signed)
  Problem: Consults Goal: RH BRAIN INJURY PATIENT EDUCATION Description: Description: See Patient Education module for eduction specifics Outcome: Progressing Goal: Skin Care Protocol Initiated - if Braden Score 18 or less Description: If consults are not indicated, leave blank or document N/A Outcome: Progressing Goal: Nutrition Consult-if indicated Outcome: Progressing   Problem: RH BOWEL ELIMINATION Goal: RH STG MANAGE BOWEL WITH ASSISTANCE Description: STG Manage Bowel with mod I Assistance. Outcome: Progressing Goal: RH STG MANAGE BOWEL W/MEDICATION W/ASSISTANCE Description: STG Manage Bowel with Medication with mod I Assistance. Outcome: Progressing   Problem: RH BLADDER ELIMINATION Goal: RH STG MANAGE BLADDER WITH ASSISTANCE Description: STG Manage Bladder With mod I Assistance Outcome: Progressing Goal: RH STG MANAGE BLADDER WITH MEDICATION WITH ASSISTANCE Description: STG Manage Bladder With Medication With mod I Assistance. Outcome: Progressing   Problem: RH SKIN INTEGRITY Goal: RH STG SKIN FREE OF INFECTION/BREAKDOWN Description: Pt will be free of skin breakdown/infection prior to DC with min assist.  Outcome: Progressing Goal: RH STG ABLE TO PERFORM INCISION/WOUND CARE W/ASSISTANCE Description: STG Able To Perform Incision/Wound Care With min Assistance. Outcome: Progressing   Problem: RH SAFETY Goal: RH STG ADHERE TO SAFETY PRECAUTIONS W/ASSISTANCE/DEVICE Description: STG Adhere to Safety Precautions With cues/reminders with AD Outcome: Progressing   Problem: RH COGNITION-NURSING Goal: RH STG USES MEMORY AIDS/STRATEGIES W/ASSIST TO PROBLEM SOLVE Description: STG Uses Memory Aids/Strategies With cues/reminders Assistance to Problem Solve. Outcome: Progressing   Problem: RH PAIN MANAGEMENT Goal: RH STG PAIN MANAGED AT OR BELOW PT'S PAIN GOAL Description: Less than 3 on 0-10 scale Outcome: Progressing   Problem: RH KNOWLEDGE DEFICIT BRAIN INJURY Goal: RH  STG INCREASE KNOWLEDGE OF SELF CARE AFTER BRAIN INJURY Description: Pt will be able to demonstrate understanding of BI teaching, safety precautions, falls, and medication management with supervision assist using handouts/booklets prior to DC.  Outcome: Progressing   Problem: Consults Goal: RH BRAIN INJURY PATIENT EDUCATION Description: Description: See Patient Education module for eduction specifics Outcome: Progressing

## 2020-07-24 NOTE — Progress Notes (Signed)
[place] PHYSICAL MEDICINE & REHABILITATION PROGRESS NOTE   Subjective/Complaints: Patient seen laying in bed this morning.  She states she slept well overnight.  She states she is looking forward to a day of rest today.  She remembers me from over 3 weeks ago.  ROS: Denies CP, SOB, N/V/D  Objective:   No results found. No results for input(s): WBC, HGB, HCT, PLT in the last 72 hours. Recent Labs    07/22/20 1201  NA 130*  K 4.0  CL 96*  CO2 22  GLUCOSE 106*  BUN 12  CREATININE 0.91  CALCIUM 8.8*    Intake/Output Summary (Last 24 hours) at 07/24/2020 1102 Last data filed at 07/23/2020 1700 Gross per 24 hour  Intake 240 ml  Output --  Net 240 ml     Physical Exam: Vital Signs Blood pressure 114/81, pulse 72, temperature 97.6 F (36.4 C), temperature source Oral, resp. rate 18, height 5\' 4"  (1.626 m), weight 74.8 kg, last menstrual period 11/14/2003, SpO2 99 %. Constitutional: No distress . Vital signs reviewed. HENT: Normocephalic.  + Crani. Eyes: EOMI. No discharge. Cardiovascular: No JVD.  RRR. Respiratory: Normal effort.  No stridor.  Bilateral clear to auscultation. GI: Non-distended.  BS +. Skin: Warm and dry.  Crani incision CDI Psych: Normal mood.  Normal behavior. Musc: No edema in extremities.  No tenderness in extremities. Neuro: Alert Motor: Bilateral upper extremities: 4+/5 proximal distal Bilateral lower extremities: Hip flexion, knee extension 4/5, ankle dorsiflexion 4+/5   Assessment/Plan: 1. Functional deficits secondary to traumatic subdural hematoma which require 3+ hours per day of interdisciplinary therapy in a comprehensive inpatient rehab setting.  Physiatrist is providing close team supervision and 24 hour management of active medical problems listed below.  Physiatrist and rehab team continue to assess barriers to discharge/monitor patient progress toward functional and medical goals  Care Tool:  Bathing  Bathing activity did not  occur: Safety/medical concerns (Per MD orders ) Body parts bathed by patient: Right arm, Left arm, Chest, Abdomen, Right upper leg, Left upper leg, Face   Body parts bathed by helper: Right lower leg, Left lower leg     Bathing assist Assist Level: Moderate Assistance - Patient 50 - 74%     Upper Body Dressing/Undressing Upper body dressing   What is the patient wearing?: Pull over shirt    Upper body assist Assist Level: Minimal Assistance - Patient > 75%    Lower Body Dressing/Undressing Lower body dressing      What is the patient wearing?: Pants     Lower body assist Assist for lower body dressing: Moderate Assistance - Patient 50 - 74%     Toileting Toileting    Toileting assist Assist for toileting: Total Assistance - Patient < 25%     Transfers Chair/bed transfer  Transfers assist     Chair/bed transfer assist level: Minimal Assistance - Patient > 75% Chair/bed transfer assistive device: Programmer, multimedia   Ambulation assist   Ambulation activity did not occur: Safety/medical concerns (symptomatic hypotension)  Assist level: Minimal Assistance - Patient > 75% Assistive device: Walker-rolling Max distance: 50 ft   Walk 10 feet activity   Assist  Walk 10 feet activity did not occur: Safety/medical concerns  Assist level: Contact Guard/Touching assist Assistive device: Walker-rolling   Walk 50 feet activity   Assist Walk 50 feet with 2 turns activity did not occur: Safety/medical concerns  Assist level: Minimal Assistance - Patient > 75% Assistive device: Walker-rolling  Walk 150 feet activity   Assist Walk 150 feet activity did not occur: Safety/medical concerns         Walk 10 feet on uneven surface  activity   Assist Walk 10 feet on uneven surfaces activity did not occur: Safety/medical concerns         Wheelchair     Assist Will patient use wheelchair at discharge?: No   Wheelchair activity did not  occur: Safety/medical concerns         Wheelchair 50 feet with 2 turns activity    Assist    Wheelchair 50 feet with 2 turns activity did not occur: Safety/medical concerns       Wheelchair 150 feet activity     Assist  Wheelchair 150 feet activity did not occur: Safety/medical concerns       Blood pressure 114/81, pulse 72, temperature 97.6 F (36.4 C), temperature source Oral, resp. rate 18, height 5\' 4"  (1.626 m), weight 74.8 kg, last menstrual period 11/14/2003, SpO2 99 %.  Medical Problem List and Plan: 1.  Decreased functional ability with gait abnormality secondary to large subacute subdural hematoma bilaterally with significant mass-effect.  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             -needs SLP for poor focus and tangential behavior  Continue CIR 2.  Antithrombotics: -DVT/anticoagulation: Subcutaneous heparin.  vascular study 8/27 shows age indeterminate clot left posterior tib vein. 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    -repeat u/s next week, repeat ultrasound ordered             -antiplatelet therapy: Aspirin 81 mg twice daily 3. Pain Management/headaches: Hydrocodone scheduled 10mg  q6H since she forgets to ask for pain medications, Topamax 75 mg nightly, Depakote 250 mg daily and 750 mg nightly.   Kpad for low back/sacral pain  Added scheduled oxy IR at 0700 and 1200 to help with pain during activity  Controlled with meds on 9/11 4. Mood: Klonopin 0.5 mg nightly as needed.   -remains motivated. Occasionally anxious             -antipsychotic agents: N/A 5. Neuropsych: This patient is capable of making decisions on her own behalf.  -continue ritalin 10mg  bid for concentration 6. Skin/Wound Care: Routine skin checks 7. Fluids/Electrolytes/Nutrition: Routine I/Os 8.  Subdural hemorrhage extending throughout the length of the  visualized spine mainly dorsal and most notable in the lower thoracic segments and at the L5-S1 level.  Unclear origin  - continue consvt care  9.  Seizure prophylaxis.  Keppra decreased to 250 daily on 9/11 10.  Hypertension.  Monitor with increased mobility. Well controlled  Decreased Cozaar to 50mg .   Controlled on 9/11 11.  Hyperlipidemia.  Lipitor 12.  Neurogenic bladder.  Urecholine 10 mg 3 times daily.    8/30 100k E coli in urine S to ctx/cephalexin   -9/1: keflex completed  Improvement with urecholine 25mg  tid, but inconsistent   -OOB to void 13.  History of alcohol use. 1-2 drinks/day per pt-  Provide counseling. 14.  Left nondisplaced fracture of left lateral malleolus and nondisplaced fracture of lateral malleolus right fibula sprain of tibiofibular ligament/right ankle after recent fall 06/14/2020.     - WBAT bilateral lower extremities.    -Cam boot on left and lace up ankle brace on right. 15. Neurogenic bowel- incontinent  -should improve as mobility and cognitively she  improves  -increase daily regimen to senkot-s 2 tabs bid  Improving 16.  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  Mild acute blood loss anemia  Hemoglobin 11.7 on 9/6  Continue to monitor   LOS: 16 days A FACE TO FACE EVALUATION WAS PERFORMED  Candido Flott Lorie Phenix 07/24/2020, 11:02 AM

## 2020-07-25 ENCOUNTER — Inpatient Hospital Stay (HOSPITAL_COMMUNITY): Payer: Medicare Other

## 2020-07-25 DIAGNOSIS — G441 Vascular headache, not elsewhere classified: Secondary | ICD-10-CM

## 2020-07-25 NOTE — Progress Notes (Signed)
Physical Therapy Session Note  Patient Details  Name: Amanda Collier MRN: [id number] Date of Birth: 03-06-1946  Today's Date: 07/25/2020 PT Individual Time: 1300-1400 PT Individual Time Calculation (min): 60 min   Short Term Goals: Week 3:  PT Short Term Goal 1 (Week 3): Patient will perform basic transfers with CGA and min cues for sequencing using LRAD. PT Short Term Goal 2 (Week 3): Patient will ambulate >100 feet min A using LRAD. PT Short Term Goal 3 (Week 3): Patient will initate stair training.  Skilled Therapeutic Interventions/Progress Updates:     Patient in TIS w/c upon PT arrival. Patient alert and agreeable to PT session. Patient reported 8-9/10 sacral/low back pain upon arrival reporting that she had been in the chair >2 hours and chair was unable to be tilted back by staff, RN made aware and provided pain medicine during session. PT provided repositioning, rest breaks, and distraction as pain interventions throughout session. Patient requesting to go to the bathroom prior to leaving the room.  Therapeutic Activity: Bed Mobility: Patient performed sit to supine with supervision with min use of bed rail. Provided verbal cues for log roll technique for reduced back pain with mobility. Transfers: Patient performed a toilet transfer using a grab bar and BSC with CGA. Patient was continent of urin on [facility] over the toilet, required set-up assist for peri-care and min A for LB clothing management with increased time due to balance and motor planning deficits. She performed sit to/from stand x5 with CGA with and without RW. Provided verbal cues for pushing up and reaching back to sit x1, otherwise used teach-back method to have patient recall cues other trials.  Gait Training:  Patient ambulated 68 feet using RW with CGA-min A. Noted patient stated various cues to herself and increased mental strain at end of gait trial. Educated patient on focusing on less while ambulating due to mental  fatigue and distraction with multiple cues. Focused the remainder of gait training on attaining reciprocal pattern and decreased external cues with improved activity tolerance and gait pattern. She ambulated 64 ft with intermittent use of L rail and arm around therapist waist to assist weight shift and pacing for improved automatic stepping pattern. She then ambulated with hand on therapist's shoulders 60 feet with 180 deg turn half way with min A with reduced assist for weight shift and improved reciprocal stepping pattern. Limited external verbal cues from therapist and patient to herself during last 2 trials, cued patient to walk from w/c to a targeted end point (chair or turn at doorway then return to the w/c).   Wheelchair Mobility:  Patient was transported in the [place] w/c with total A throughout session for energy conservation and time management.   Neuromuscular Re-ed: Patient performed the following standing balance activities: -standing balance x3 min without UE support progressing from CGA-close supervision, focused and forward weight shift with weight into toes due to mild posterior bias and increased confidence with balance without UE support  Patient required increased time and rest breaks with all mobility. 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  Patient in bed at end of session with breaks locked, bed alarm set, and all needs within reach.    Therapy Documentation Precautions:  Precautions Precautions: Fall Precaution Comments: drains removed Required Braces or Orthoses: Other Brace Other Brace: L cam boot; R lace up ankle brace; has sneaker for R foot Restrictions Weight Bearing  Restrictions: No RLE Weight Bearing: Weight bearing as tolerated LLE Weight Bearing: Weight bearing as tolerated Other Position/Activity Restrictions: WBAT with braces   Therapy/Group:  Individual Therapy  Renesha Lizama L Petrea Fredenburg PT, DPT  07/25/2020, 3:39 PM

## 2020-07-25 NOTE — Progress Notes (Signed)
[facility] PHYSICAL MEDICINE & REHABILITATION PROGRESS NOTE   Subjective/Complaints: Patient seen laying in bed this morning.  She enjoyed a day of rest yesterday.  She denies complaints.  ROS: Denies CP, SOB, N/V/D  Objective:   No results found. No results for input(s): WBC, HGB, HCT, PLT in the last 72 hours. No results for input(s): NA, K, CL, CO2, GLUCOSE, BUN, CREATININE, CALCIUM in the last 72 hours.  Intake/Output Summary (Last 24 hours) at 07/25/2020 1458 Last data filed at 07/25/2020 0923 Gross per 24 hour  Intake 120 ml  Output --  Net 120 ml     Physical Exam: Vital Signs Blood pressure 99/76, pulse 88, temperature 98.2 F (36.8 C), resp. rate 18, height 5\' 4"  (1.626 m), weight 74.8 kg, last menstrual period 11/14/2003, SpO2 99 %.  Constitutional: No distress . Vital signs reviewed. HENT: Normocephalic.  + Crani. Eyes: EOMI. No discharge. Cardiovascular: No JVD.  RRR. Respiratory: Normal effort.  No stridor.  Bilateral clear to auscultation. GI: Non-distended.  BS +. Skin: Warm and dry.  Crani incision CDI Psych: Normal mood.  Normal behavior. Musc: No edema in extremities.  No tenderness in extremities. Neuro: Alert Motor: Bilateral upper extremities: 4+/5 proximal distal Bilateral lower extremities: Hip flexion, knee extension 4/5, ankle dorsiflexion 4+/5, unchanged   Assessment/Plan: 1. Functional deficits secondary to traumatic subdural hematoma which require 3+ hours per day of interdisciplinary therapy in a comprehensive inpatient rehab setting.  Physiatrist is providing close team supervision and 24 hour management of active medical problems listed below.  Physiatrist and rehab team continue to assess barriers to discharge/monitor patient progress toward functional and medical goals  Care Tool:  Bathing  Bathing activity did not occur: Safety/medical concerns (Per MD orders ) Body parts bathed by patient: Right arm, Left arm, Chest, Abdomen, Right  upper leg, Left upper leg, Face   Body parts bathed by helper: Right lower leg, Left lower leg     Bathing assist Assist Level: Moderate Assistance - Patient 50 - 74%     Upper Body Dressing/Undressing Upper body dressing   What is the patient wearing?: Pull over shirt    Upper body assist Assist Level: Minimal Assistance - Patient > 75%    Lower Body Dressing/Undressing Lower body dressing      What is the patient wearing?: Pants     Lower body assist Assist for lower body dressing: Moderate Assistance - Patient 50 - 74%     Toileting Toileting    Toileting assist Assist for toileting: Total Assistance - Patient < 25%     Transfers Chair/bed transfer  Transfers assist     Chair/bed transfer assist level: Minimal Assistance - Patient > 75% Chair/bed transfer assistive device: Programmer, multimedia   Ambulation assist   Ambulation activity did not occur: Safety/medical concerns (symptomatic hypotension)  Assist level: Minimal Assistance - Patient > 75% Assistive device: Walker-rolling Max distance: 50 ft   Walk 10 feet activity   Assist  Walk 10 feet activity did not occur: Safety/medical concerns  Assist level: Contact Guard/Touching assist Assistive device: Walker-rolling   Walk 50 feet activity   Assist Walk 50 feet with 2 turns activity did not occur: Safety/medical concerns  Assist level: Minimal Assistance - Patient > 75% Assistive device: Walker-rolling    Walk 150 feet activity   Assist Walk 150 feet activity did not occur: Safety/medical concerns         Walk 10 feet on uneven surface  activity  Assist Walk 10 feet on uneven surfaces activity did not occur: Safety/medical concerns         Wheelchair     Assist Will patient use wheelchair at discharge?: No   Wheelchair activity did not occur: Safety/medical concerns         Wheelchair 50 feet with 2 turns activity    Assist    Wheelchair 50  feet with 2 turns activity did not occur: Safety/medical concerns       Wheelchair 150 feet activity     Assist  Wheelchair 150 feet activity did not occur: Safety/medical concerns       Blood pressure 99/76, pulse 88, temperature 98.2 F (36.8 C), resp. rate 18, height 5\' 4"  (1.626 m), weight 74.8 kg, last menstrual period 11/14/2003, SpO2 99 %.  Medical Problem List and Plan: 1.  Decreased functional ability with gait abnormality secondary to large subacute subdural hematoma bilaterally with significant mass-effect.  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             -needs SLP for poor focus and tangential behavior  Continue CIR 2.  Antithrombotics: -DVT/anticoagulation: Subcutaneous heparin.  vascular study 8/27 shows age indeterminate clot left posterior tib vein. 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    -repeat u/s next week, repeat ultrasound ordered             -antiplatelet therapy: Aspirin 81 mg twice daily 3. Pain Management/headaches: Hydrocodone scheduled 10mg  q6H since she forgets to ask for pain medications, Topamax 75 mg nightly, Depakote 250 mg daily and 750 mg nightly.   Kpad for low back/sacral pain  Added scheduled oxy IR at 0700 and 1200 to help with pain during activity  Controlled with meds on 9/12 4. Mood: Klonopin 0.5 mg nightly as needed.   -remains motivated. Occasionally anxious             -antipsychotic agents: N/A 5. Neuropsych: This patient is capable of making decisions on her own behalf.  -continue ritalin 10mg  bid for concentration 6. Skin/Wound Care: Routine skin checks 7. Fluids/Electrolytes/Nutrition: Routine I/Os 8.  Subdural hemorrhage extending throughout the length of the visualized spine mainly dorsal and most notable in the lower thoracic segments and at the L5-S1 level.  Unclear origin  - continue consvt care   9.  Seizure prophylaxis.  Keppra decreased to 250 daily on 9/11 10.  Hypertension.  Monitor with increased mobility.  Decreased Cozaar to 50mg .   Controlled on 9/12, avoid hypoperfusion 11.  Hyperlipidemia.  Lipitor 12.  Neurogenic bladder.  Urecholine 10 mg 3 times daily.    8/30 100k E coli in urine S to ctx/cephalexin   -9/1: keflex completed  Improvement with urecholine 25mg  tid, but inconsistent   -OOB to void 13.  History of alcohol use. 1-2 drinks/day per pt-  Provide counseling. 14.  Left nondisplaced fracture of left lateral malleolus and nondisplaced fracture of lateral malleolus right fibula sprain of tibiofibular ligament/right ankle after recent fall 06/14/2020.     - WBAT bilateral lower extremities.    -Cam boot on left and lace up ankle brace on right. 15. Neurogenic bowel- incontinent  -should improve as mobility and cognitively she improves  -increase daily regimen to senkot-s 2 tabs bid  Improving 16.  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  Mild acute  blood loss anemia  Hemoglobin 11.7 on 9/6  Continue to monitor   LOS: 17 days A FACE TO FACE EVALUATION WAS PERFORMED  Amariana Mirando Lorie Phenix 07/25/2020, 2:58 PM

## 2020-07-25 NOTE — Progress Notes (Signed)
Occupational Therapy Session Note  Patient Details  Name: Amanda Collier MRN: [id number] Date of Birth: 1946-02-05  Today's Date: 07/25/2020 OT Individual Time: 1100-1200 OT Individual Time Calculation (min): 60 min    Short Term Goals: Week 3:  OT Short Term Goal 1 (Week 3): Pt will thread 1LE into pants with AE PRN OT Short Term Goal 2 (Week 3): Pt will complete toilet transfers with CGA with LRAD 3 out of 4 trials OT Short Term Goal 3 (Week 3): Pt will complete 2 grooming tasks in standing with CGA to demonstrate increased standing balance  Skilled Therapeutic Interventions/Progress Updates:    Pt resting in bed upon arrival.  Pt requested to change clothing and brush teeth.  OT intervention with focus on bed mobility, sit<>stand in [place], standing balance in [place], dressing with sit<>stand, and problem solving activities to increase independence with BADLs. Bed mobility with supervision.  Pt informed therapist boot is for L foot and brace for R foot but unable to provided instructions on how to don. Sit<>stand in [place] with CGA and standing balance with CGA but required assistance pulling over hips.  Pt required max verbal cues to attend to task. Pt required assistance with front closure bra. Pt required max verbal cues to attend to task when brushing teeth.  Pt is hyperverbal. Pt transitioned to gym and engaged in two (2) pipe tree activities with increased diffuculty. Pt self corrected errors with first structure but required mod verbal cues during 2nd activity. Pt returned to room and remained in w/c with belt alarm activated and all needs within reach.   Therapy Documentation Precautions:  Precautions Precautions: Fall Precaution Comments: drains removed Required Braces or Orthoses: Other Brace Other Brace: L cam boot; R lace up ankle brace; has sneaker for R foot Restrictions Weight Bearing Restrictions: No RLE Weight Bearing: Weight bearing as tolerated LLE Weight Bearing:  Weight bearing as tolerated Other Position/Activity Restrictions: WBAT with braces   Pain:  Pt denies pain this morning   Therapy/Group: Individual Therapy  Leroy Libman 07/25/2020, 12:13 PM

## 2020-07-26 ENCOUNTER — Inpatient Hospital Stay (HOSPITAL_COMMUNITY): Payer: Medicare Other

## 2020-07-26 ENCOUNTER — Inpatient Hospital Stay (HOSPITAL_COMMUNITY): Payer: Medicare Other | Admitting: Speech Pathology

## 2020-07-26 DIAGNOSIS — Z86718 Personal history of other venous thrombosis and embolism: Secondary | ICD-10-CM

## 2020-07-26 LAB — BASIC METABOLIC PANEL
Anion gap: 10 (ref 5–15)
BUN: 10 mg/dL (ref 8–23)
CO2: 22 mmol/L (ref 22–32)
Calcium: 9 mg/dL (ref 8.9–10.3)
Chloride: 101 mmol/L (ref 98–111)
Creatinine, Ser: 0.84 mg/dL (ref 0.44–1.00)
GFR calc Af Amer: >60 mL/min (ref >60)
GFR calc non Af Amer: >60 mL/min (ref >60)
Glucose, Bld: 101 mg/dL — ABNORMAL HIGH (ref 70–99)
Potassium: 4.2 mmol/L (ref 3.5–5.1)
Sodium: 133 mmol/L — ABNORMAL LOW (ref 135–145)

## 2020-07-26 LAB — CBC WITH DIFFERENTIAL/PLATELET
Abs Immature Granulocytes: 0.04 10*3/uL (ref 0.00–0.07)
Basophils Absolute: 0 10*3/uL (ref 0.0–0.1)
Basophils Relative: 1 %
Eosinophils Absolute: 0.2 10*3/uL (ref 0.0–0.5)
Eosinophils Relative: 4 %
HCT: 35.7 % — ABNORMAL LOW (ref 36.0–46.0)
Hemoglobin: 11.9 g/dL — ABNORMAL LOW (ref 12.0–15.0)
Immature Granulocytes: 1 %
Lymphocytes Relative: 33 %
Lymphs Abs: 1.4 10*3/uL (ref 0.7–4.0)
MCH: 32.2 pg (ref 26.0–34.0)
MCHC: 33.3 g/dL (ref 30.0–36.0)
MCV: 96.7 fL (ref 80.0–100.0)
Monocytes Absolute: 0.6 10*3/uL (ref 0.1–1.0)
Monocytes Relative: 14 %
Neutro Abs: 2 10*3/uL (ref 1.7–7.7)
Neutrophils Relative %: 47 %
Platelets: 301 10*3/uL (ref 150–400)
RBC: 3.69 MIL/uL — ABNORMAL LOW (ref 3.87–5.11)
RDW: 12.3 % (ref 11.5–15.5)
WBC: 4.2 10*3/uL (ref 4.0–10.5)
nRBC: 0 % (ref 0.0–0.2)

## 2020-07-26 MED ORDER — SENNOSIDES-DOCUSATE SODIUM 8.6-50 MG PO TABS
2.0000 | ORAL_TABLET | Freq: Every day | ORAL | Status: DC
  Administered 2020-07-27: 2 via ORAL
  Filled 2020-07-26 (×8): qty 2

## 2020-07-26 MED ORDER — LINACLOTIDE 145 MCG PO CAPS
145.0000 ug | ORAL_CAPSULE | Freq: Every day | ORAL | Status: DC
  Administered 2020-07-27 – 2020-07-28 (×2): 145 ug via ORAL
  Filled 2020-07-26 (×2): qty 1

## 2020-07-26 MED ORDER — MAGNESIUM CITRATE PO SOLN
1.0000 | Freq: Once | ORAL | Status: AC
  Administered 2020-07-26: 1 via ORAL
  Filled 2020-07-26: qty 296

## 2020-07-26 NOTE — Progress Notes (Signed)
Occupational Therapy Session Note  Patient Details  Name: DEION FORGUE MRN: [id number] Date of Birth: Feb 06, 1946  Today's Date: 07/26/2020 OT Individual Time: 1962-2297 OT Individual Time Calculation (min): 45 min   Session 2: OT Individual Time: 1030-1100 OT Individual Time Calculation (min): 30 min    Short Term Goals: Week 3:  OT Short Term Goal 1 (Week 3): Pt will thread 1LE into pants with AE PRN OT Short Term Goal 2 (Week 3): Pt will complete toilet transfers with CGA with LRAD 3 out of 4 trials OT Short Term Goal 3 (Week 3): Pt will complete 2 grooming tasks in standing with CGA to demonstrate increased standing balance  Skilled Therapeutic Interventions/Progress Updates:   Pt received supine complaining of constipation and associated abdominal discomfort. Abdominal massage provided and demonstrated to pt to assist with bowel movement and evacuation. Pt urgently requested transfer to the toilet. Pt completed stand pivot to TIS w/c with min A. Pt was brought into the bathroom where she used grab bar to transfer to [facility] with CGA. Boot and ankle brace donned. Trash can used to assist with positioning LE to encourage BM. Cueing required for diaphragmatic breathing to increase relaxation and reduce tensing up/anxiety re voiding. Pt was able to void a very small BM. C/o pain from hemorrhoids and encouraged to not strain. Pt completed sit > stand from [facility] and completed peri care in standing with CGA and UE support on grab bar. Assist provided to don new brief. Pt transferred to w/c with CGA. Pt sat at the sink and completed hand hygiene, oral care, and grooming tasks with set up assist. UB bathing and dressing with assist to fasten bra but (S) to don shirt. Ultrasound tech entered room and requested pt return to supine. Pt completed stand pivot transfer with min A back to bed. Pt was left supine with all needs met. 15 min missed.    Session 2: Pt received supine, no c/o pain. Requesting to  use bathroom again. Pt completed bed mobility to EOB with supervision. Cam boot and ankle brace donned total A. Pt used RW to complete functional mobility into the bathroom with CGA overall, min A required briefly for navigating elevated threshold. Pt was able to complete all toileting tasks with CGA and min cueing for sequencing. Very small BM voided. Pt used RW to complete ambulatory transfer back to the EOB with CGA. She was able to transfer back to bed with min cueing for positioning. Pt was passed off to SLP in room.   Therapy Documentation Precautions:  Precautions Precautions: Fall Precaution Comments: drains removed Required Braces or Orthoses: Other Brace Other Brace: L cam boot; R lace up ankle brace; has sneaker for R foot Restrictions Weight Bearing Restrictions: No RLE Weight Bearing: Weight bearing as tolerated LLE Weight Bearing: Weight bearing as tolerated Other Position/Activity Restrictions: WBAT with braces  Therapy/Group: Individual Therapy  Curtis Sites 07/26/2020, 6:47 AM

## 2020-07-26 NOTE — Progress Notes (Signed)
Speech Language Pathology Daily Session Note  Patient Details  Name: Amanda Collier MRN: [id number] Date of Birth: 28-Feb-1946  Today's Date: 07/26/2020 SLP Individual Time: 1100-1155 SLP Individual Time Calculation (min): 55 min  Short Term Goals: Week 3: SLP Short Term Goal 1 (Week 3): Patient will demonstrate functional problem solving for mildly complex tasks with Supervision verbal cues. SLP Short Term Goal 2 (Week 3): Patient will recall new, daily information with Supervision verbal and visual cues. SLP Short Term Goal 3 (Week 3): Patient will demonstrate sustained attention to functional tasks for 45 minutes with Min verbal cues for redirection.  Skilled Therapeutic Interventions: Skilled treatment session focused on cognitive goals. SLP facilitated session by providing overall supervision level verbal cues for complex problem solving and attention during a functional grocery budgeting task. Patient left upright in bed with alarm on and all needs within reach. Continue with current plan of care.      Pain No reports of pain throughout session  Therapy/Group: Individual Therapy  Armel Rabbani 07/26/2020, 12:31 PM

## 2020-07-26 NOTE — Plan of Care (Signed)
  Problem: Sit to Stand Goal: LTG:  Patient will perform sit to stand with assistance level (PT) Description: LTG:  Patient will perform sit to stand with assistance level (PT) Flowsheets (Taken 07/26/2020 1308) LTG: PT will perform sit to stand in preparation for functional mobility with assistance level: (upgraded goal due to improved balance and strength with functional mobility.) Contact Guard/Touching assist Note: upgraded goal due to improved balance and strength with functional mobility.   Problem: RH Bed to Chair Transfers Goal: LTG Patient will perform bed/chair transfers w/assist (PT) Description: LTG: Patient will perform bed to chair transfers with assistance (PT). Flowsheets (Taken 07/26/2020 1308) LTG: Pt will perform Bed to Chair Transfers with assistance level: (upgraded goal due to improved balance and strength with functional mobility.) Contact Guard/Touching assist Note: upgraded goal due to improved balance and strength with functional mobility.

## 2020-07-26 NOTE — Progress Notes (Signed)
Pt refused 2400 and 0600 hydrocodone "I think it is making me constipated"

## 2020-07-26 NOTE — Progress Notes (Signed)
Left lower Ext. study completed.   See CVProc for preliminary results.   Griffin Basil, RDMS, RVT

## 2020-07-26 NOTE — Progress Notes (Signed)
Physical Therapy Session Note  Patient Details  Name: Amanda Collier MRN: [id number] Date of Birth: 1945-12-22  Today's Date: 07/26/2020 PT Individual Time: 1300-1405 PT Individual Time Calculation (min): 65 min   Short Term Goals: Week 3:  PT Short Term Goal 1 (Week 3): Patient will perform basic transfers with CGA and min cues for sequencing using LRAD. PT Short Term Goal 2 (Week 3): Patient will ambulate >100 feet min A using LRAD. PT Short Term Goal 3 (Week 3): Patient will initate stair training.  Skilled Therapeutic Interventions/Progress Updates:     Patient in bed upon PT arrival. Patient alert and agreeable to PT session. Patient reported 3/10 progressing to 6/10 sacral pain during session, RN made aware. PT provided repositioning, rest breaks, and distraction as pain interventions throughout session. Reported increased pain and lack of sleep overnight, stated that she did not take pain medicine and was preoccupied with concern about having a BM. Patient educated on use of pain medicine at night to allow sleep and reducing anxiety about BM due to importance of sleep for participation in therapy and improved function throughout the day. Patient stated understanding and appreciated education.   Therapeutic Activity: Bed Mobility: Patient performed supine to/from sit with supervision with min use of bed rail. Provided verbal cues for log rolling for improved pain management with mobility. Donned/doffed R ankle brace and shoe and L CAM boot with total A with patient sitting EOB with supervision for sitting balance. Transfers: Patient performed stand pivot bed<>TIS w/c using RW with min A for boosting up and AD management. She performed sit to/from stand x2 with min A-CGA using a rail. Provided verbal cues for forward weight shift, and safety with use of RW, patient recalled cues for B LEs touching seat before sitting and hand placement on RW.  Gait Training:  Patient ascended/descended  4-6" steps using B rails and 4-6" steps using L rail with B UE support with min A ascending and mod A descending. Performed step-to gait pattern leading with R while ascending and L while descending. Provided cues for technique and sequencing. Required significantly increased time on descent and cues for R knee bend to bring L foot to the next step.  Set up patient for gait training and patient reported significant fatigue and requesting to return to the room at this time.   Wheelchair Mobility:  Patient was transported in the w/c with total A throughout session for energy conservation and time management. Reported increased sacral pain without w/c tilted back.   Discussed POC, PT goals, and home set up. Patient initially reported R rail 1/2 way up the stairs then switching to L rail the remainder of the way up on flight to second level of home. She then stated that a L rail could be used all the way up, patient's husband coming in for family edu later this week and will confirm home set-up at that time.   Patient in bed falling asleep at end of session with breaks locked, bed alarm set, and all needs within reach. Patient missed 10 min of skilled PT due to fatigue, RN made aware. Will attempt to make-up missed time as able.     Therapy Documentation Precautions:  Precautions Precautions: Fall Precaution Comments: drains removed Required Braces or Orthoses: Other Brace Other Brace: L cam boot; R lace up ankle brace; has sneaker for R foot Restrictions Weight Bearing Restrictions: No RLE Weight Bearing: Weight bearing as tolerated LLE Weight Bearing: Weight bearing as tolerated  Other Position/Activity Restrictions: WBAT with braces General: PT Amount of Missed Time (min): 10 Minutes PT Missed Treatment Reason: Patient fatigue;Pain   Therapy/Group: Individual Therapy  Kedric Bumgarner L Karthik Whittinghill PT, DPT  07/26/2020, 2:13 PM

## 2020-07-26 NOTE — Progress Notes (Signed)
[place] PHYSICAL MEDICINE & REHABILITATION PROGRESS NOTE   Subjective/Complaints: Still feels very constipated. Backed off pain meds d/t concern re: constipation.   ROS: Patient denies fever, rash, sore throat, blurred vision, nausea, vomiting, diarrhea, cough, shortness of breath or chest pain, joint or back pain, headache, or mood change.    Objective:   VAS [place] LOWER EXTREMITY VENOUS (DVT)  Result Date: 07/26/2020  Lower Venous DVTStudy Indications: Previous DVT left.  Risk Factors: DVT Left PTV. Comparison Study: 07/09/20 Left Performing Technologist: Griffin Basil RCT RDMS  Examination Guidelines: A complete evaluation includes B-mode imaging, spectral Doppler, color Doppler, and power Doppler as needed of all accessible portions of each vessel. Bilateral testing is considered an integral part of a complete examination. Limited examinations for reoccurring indications may be performed as noted. The reflux portion of the exam is performed with the patient in reverse Trendelenburg.  +---------+---------------+---------+-----------+----------+--------------+ LEFT     CompressibilityPhasicitySpontaneityPropertiesThrombus Aging +---------+---------------+---------+-----------+----------+--------------+ CFV      Full           Yes      Yes                                 +---------+---------------+---------+-----------+----------+--------------+ SFJ      Full                                                        +---------+---------------+---------+-----------+----------+--------------+ FV Prox  Full                                                        +---------+---------------+---------+-----------+----------+--------------+ FV Mid   Full                                                        +---------+---------------+---------+-----------+----------+--------------+ FV DistalFull                                                         +---------+---------------+---------+-----------+----------+--------------+ PFV      Full                                                        +---------+---------------+---------+-----------+----------+--------------+ POP      Full           Yes      Yes                                 +---------+---------------+---------+-----------+----------+--------------+ PTV      Full                                                        +---------+---------------+---------+-----------+----------+--------------+  PERO     Full                                                        +---------+---------------+---------+-----------+----------+--------------+     Summary: RIGHT: - No evidence of common femoral vein obstruction.  LEFT: - There is no evidence of deep vein thrombosis in the lower extremity.  - No cystic structure found in the popliteal fossa.  *See table(s) above for measurements and observations.    Preliminary    Recent Labs    07/26/20 0630  WBC 4.2  HGB 11.9*  HCT 35.7*  PLT 301   Recent Labs    07/26/20 0630  NA 133*  K 4.2  CL 101  CO2 22  GLUCOSE 101*  BUN 10  CREATININE 0.84  CALCIUM 9.0    Intake/Output Summary (Last 24 hours) at 07/26/2020 1139 Last data filed at 07/25/2020 1828 Gross per 24 hour  Intake 560 ml  Output --  Net 560 ml     Physical Exam: Vital Signs Blood pressure 118/65, pulse 76, temperature 97.7 F (36.5 C), temperature source Oral, resp. rate 17, height 5\' 4"  (1.626 m), weight 74.8 kg, last menstrual period 11/14/2003, SpO2 96 %.  Constitutional: No distress . Vital signs reviewed. HEENT: EOMI, oral membranes moist Neck: supple Cardiovascular: RRR without murmur. No JVD    Respiratory/Chest: CTA Bilaterally without wheezes or rales. Normal effort    GI/Abdomen: BS +, non-tender, non-distended Ext: no clubbing, cyanosis, or edema Psych: pleasant and cooperative Musc: No edema in extremities.  No tenderness in  extremities. Neuro: Alert Motor: Bilateral upper extremities: 4+/5 proximal distal Bilateral lower extremities: Hip flexion, knee extension 4/5, ankle dorsiflexion 4+/5, unchanged   Assessment/Plan: 1. Functional deficits secondary to traumatic subdural hematoma which require 3+ hours per day of interdisciplinary therapy in a comprehensive inpatient rehab setting.  Physiatrist is providing close team supervision and 24 hour management of active medical problems listed below.  Physiatrist and rehab team continue to assess barriers to discharge/monitor patient progress toward functional and medical goals  Care Tool:  Bathing  Bathing activity did not occur: Safety/medical concerns (Per MD orders ) Body parts bathed by patient: Right arm, Left arm, Chest, Abdomen, Face, Front perineal area, Buttocks   Body parts bathed by helper: Right lower leg, Left lower leg     Bathing assist Assist Level: Minimal Assistance - Patient > 75%     Upper Body Dressing/Undressing Upper body dressing   What is the patient wearing?: Pull over shirt, Bra    Upper body assist Assist Level: Minimal Assistance - Patient > 75%    Lower Body Dressing/Undressing Lower body dressing      What is the patient wearing?: Pants     Lower body assist Assist for lower body dressing: Minimal Assistance - Patient > 75%     Toileting Toileting    Toileting assist Assist for toileting: Minimal Assistance - Patient > 75%     Transfers Chair/bed transfer  Transfers assist     Chair/bed transfer assist level: Minimal Assistance - Patient > 75% Chair/bed transfer assistive device: Programmer, multimedia   Ambulation assist   Ambulation activity did not occur: Safety/medical concerns (symptomatic hypotension)  Assist level: Minimal Assistance - Patient > 75% Assistive device: No Device Max distance: 60  ft   Walk 10 feet activity   Assist  Walk 10 feet activity did not occur:  Safety/medical concerns  Assist level: Minimal Assistance - Patient > 75% Assistive device: No Device   Walk 50 feet activity   Assist Walk 50 feet with 2 turns activity did not occur: Safety/medical concerns  Assist level: Minimal Assistance - Patient > 75% Assistive device: No Device    Walk 150 feet activity   Assist Walk 150 feet activity did not occur: Safety/medical concerns         Walk 10 feet on uneven surface  activity   Assist Walk 10 feet on uneven surfaces activity did not occur: Safety/medical concerns         Wheelchair     Assist Will patient use wheelchair at discharge?: No   Wheelchair activity did not occur: Safety/medical concerns         Wheelchair 50 feet with 2 turns activity    Assist    Wheelchair 50 feet with 2 turns activity did not occur: Safety/medical concerns       Wheelchair 150 feet activity     Assist  Wheelchair 150 feet activity did not occur: Safety/medical concerns       Blood pressure 118/65, pulse 76, temperature 97.7 F (36.5 C), temperature source Oral, resp. rate 17, height 5\' 4"  (1.626 m), weight 74.8 kg, last menstrual period 11/14/2003, SpO2 96 %.  Medical Problem List and Plan: 1.  Decreased functional ability with gait abnormality secondary to large subacute subdural hematoma bilaterally with significant mass-effect.  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             -Continue CIR 2.  Antithrombotics: -DVT/anticoagulation: Subcutaneous heparin.  vascular study 8/27 shows age indeterminate clot left posterior tib vein. 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    -repeat u/s NEGATIVE today 9/13             -antiplatelet therapy: Aspirin 81 mg twice daily 3. Pain Management/headaches: Hydrocodone scheduled 10mg  q6H since she forgets to ask for pain medications, Topamax 75 mg  nightly, Depakote 250 mg daily and 750 mg nightly.   Kpad for low back/sacral pain  Added scheduled oxy IR at 0700 and 1200 to help with pain during activity  Controlled with meds on 9/13 4. Mood: Klonopin 0.5 mg nightly as needed.   -remains motivated. Occasionally anxious             -antipsychotic agents: N/A 5. Neuropsych: This patient is capable of making decisions on her own behalf.  -continue ritalin 10mg  bid for concentration 6. Skin/Wound Care: Routine skin checks 7. Fluids/Electrolytes/Nutrition: Routine I/Os 8.  Subdural hemorrhage extending throughout the length of the visualized spine mainly dorsal and most notable in the lower thoracic segments and at the L5-S1 level.  Unclear origin  - continue consvt care  9.  Seizure prophylaxis.  Keppra decreased to 250 daily on 9/11 10.  Hypertension.  Monitor with increased mobility.  Decreased Cozaar to 50mg .   Controlled on 9/12, avoid hypoperfusion 11.  Hyperlipidemia.  Lipitor 12.  Neurogenic bladder.  Urecholine 10 mg 3 times daily.    8/30 100k E coli in urine S to ctx/cephalexin   -9/1: keflex completed  Improvement with urecholine 25mg  tid   -OOB to void 13.  History of alcohol use. 1-2 drinks/day per pt-  Provide counseling. 14.  Left nondisplaced  fracture of left lateral malleolus and nondisplaced fracture of lateral malleolus right fibula sprain of tibiofibular ligament/right ankle after recent fall 06/14/2020.     - WBAT bilateral lower extremities.    -Cam boot on left and lace up ankle brace on right.  -will order f/u xrays today. Spoke with ortho Friday who will look at filims this week before advancing 15. Neurogenic bowel- incontinent  -should improve as mobility and cognitively she improves  -increased daily regimen to senkot-s 2 tabs bid  Still becoming constipated---mag citrate and SSE today   -will ad linzess to daily regimen 16.  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  Mild acute blood loss anemia  Hemoglobin 11.7 on 9/6--->11.0 9/13  Continue to monitor   LOS: 18 days A FACE TO FACE EVALUATION WAS PERFORMED  Meredith Staggers 07/26/2020, 11:39 AM

## 2020-07-26 NOTE — Plan of Care (Signed)
  Problem: Consults Goal: RH BRAIN INJURY PATIENT EDUCATION Description: Description: See Patient Education module for eduction specifics Outcome: Progressing Goal: Skin Care Protocol Initiated - if Braden Score 18 or less Description: If consults are not indicated, leave blank or document N/A Outcome: Progressing Goal: Nutrition Consult-if indicated Outcome: Progressing   Problem: RH BOWEL ELIMINATION Goal: RH STG MANAGE BOWEL WITH ASSISTANCE Description: STG Manage Bowel with mod I Assistance. Outcome: Progressing Goal: RH STG MANAGE BOWEL W/MEDICATION W/ASSISTANCE Description: STG Manage Bowel with Medication with mod I Assistance. Outcome: Progressing   Problem: RH BLADDER ELIMINATION Goal: RH STG MANAGE BLADDER WITH ASSISTANCE Description: STG Manage Bladder With mod I Assistance Outcome: Progressing Goal: RH STG MANAGE BLADDER WITH MEDICATION WITH ASSISTANCE Description: STG Manage Bladder With Medication With mod I Assistance. Outcome: Progressing   Problem: RH SKIN INTEGRITY Goal: RH STG SKIN FREE OF INFECTION/BREAKDOWN Description: Pt will be free of skin breakdown/infection prior to DC with min assist.  Outcome: Progressing Goal: RH STG ABLE TO PERFORM INCISION/WOUND CARE W/ASSISTANCE Description: STG Able To Perform Incision/Wound Care With min Assistance. Outcome: Progressing   Problem: RH SAFETY Goal: RH STG ADHERE TO SAFETY PRECAUTIONS W/ASSISTANCE/DEVICE Description: STG Adhere to Safety Precautions With cues/reminders with AD Outcome: Progressing   Problem: RH COGNITION-NURSING Goal: RH STG USES MEMORY AIDS/STRATEGIES W/ASSIST TO PROBLEM SOLVE Description: STG Uses Memory Aids/Strategies With cues/reminders Assistance to Problem Solve. Outcome: Progressing   Problem: RH PAIN MANAGEMENT Goal: RH STG PAIN MANAGED AT OR BELOW PT'S PAIN GOAL Description: Less than 3 on 0-10 scale Outcome: Progressing   Problem: RH KNOWLEDGE DEFICIT BRAIN INJURY Goal: RH  STG INCREASE KNOWLEDGE OF SELF CARE AFTER BRAIN INJURY Description: Pt will be able to demonstrate understanding of BI teaching, safety precautions, falls, and medication management with supervision assist using handouts/booklets prior to DC.  Outcome: Progressing   Problem: Consults Goal: RH BRAIN INJURY PATIENT EDUCATION Description: Description: See Patient Education module for eduction specifics Outcome: Progressing

## 2020-07-27 ENCOUNTER — Inpatient Hospital Stay (HOSPITAL_COMMUNITY): Payer: Medicare Other

## 2020-07-27 ENCOUNTER — Inpatient Hospital Stay (HOSPITAL_COMMUNITY): Payer: Medicare Other | Admitting: Speech Pathology

## 2020-07-27 ENCOUNTER — Inpatient Hospital Stay (HOSPITAL_COMMUNITY): Payer: Medicare Other | Admitting: Physical Therapy

## 2020-07-27 NOTE — Progress Notes (Signed)
Physical Therapy Session Note  Patient Details  Name: Amanda Collier MRN: [id number] Date of Birth: 1946-08-17  Today's Date: 07/27/2020 PT Individual Time: 1345-1445 PT Individual Time Calculation (min): 60 min   Short Term Goals: Week 1:  PT Short Term Goal 1 (Week 1): Pt will perform bed mobility with modA PT Short Term Goal 1 - Progress (Week 1): Met PT Short Term Goal 2 (Week 1): Pt will maintain sitting EOB >5 minutes with CGA PT Short Term Goal 2 - Progress (Week 1): Met PT Short Term Goal 3 (Week 1): Pt will perform bed<>chair transfer with modA and LRAD PT Short Term Goal 3 - Progress (Week 1): Met PT Short Term Goal 4 (Week 1): Pt will initiate pre-gait and standing activities with modA and LRAD PT Short Term Goal 4 - Progress (Week 1): Met Week 2:  PT Short Term Goal 1 (Week 2): Patient will perform bed mobility with min A consitently without use of hospital bed features. PT Short Term Goal 1 - Progress (Week 2): Met PT Short Term Goal 2 (Week 2): Patient will tolerate sitting OOB >1 hour each day. PT Short Term Goal 2 - Progress (Week 2): Progressing toward goal PT Short Term Goal 3 (Week 2): Patient will perform basic transfers with min A using LRAD. PT Short Term Goal 3 - Progress (Week 2): Met PT Short Term Goal 4 (Week 2): Patient will ambulate >30 feet using LRAD with min A. PT Short Term Goal 4 - Progress (Week 2): Met Week 3:  PT Short Term Goal 1 (Week 3): Patient will perform basic transfers with CGA and min cues for sequencing using LRAD. PT Short Term Goal 2 (Week 3): Patient will ambulate >100 feet min A using LRAD. PT Short Term Goal 3 (Week 3): Patient will initate stair training.  Skilled Therapeutic Interventions/Progress Updates:    PAIN  Pt initially supine and agreeable to treatment.  Focus of todays session: Reciprocal gait pattern.  Supine to sit w/supervision.  Pt wearing R laceup boot, Camb[place] applied by therapist to LLE.   STS and short  distance gait to wc w/hands on therapists shoulders. Pt transported to hallway for gait trials:  Gait 58f w/HHA, pt w/very slowed gait, impaired step length, perceived instability.  Decreased deficits w/use of rail on R but not signficantly and paucity of movement noted.  Gait 1135fw/hands on shoulders w/inconsistent cadence but uses step thru pattern/reciprocal gait, deviations related to cam boot and occasional decreased step length but much improved from above.  Practiced STS from couch in apartment for functional training, min assist primatily to facilitate ant wt shift w/transition.  Repeated gait trials.: Gait trial x 10050fushing grocery cart w/verbal and tactile cues to facilitate consistent cadence/tends vary and when slows, deivations increase/decreased step length tendency.  MD in and advised pt may now remove both LE Cam boot and lace up brace and to report if any increase in pain.  Therapist removed both and gripper socks donned by therapist for time management.  Pt continued w/gait trials w/grocery cart >120f9fcga and improved gait pattern/reciprocal gait and consistency of cadence out of brace/boot and w/tactile cues.   Pt c/o need for BM Commode transfer w/min assist no AD, resorts to shuffling when no AD provided. Manages pants and performs hygiene w/cga only.  Pt unable to produce BM/continued w/session.  Gait trial 90ft68frocery cart, tactile cues for cadence but notable improvement w/deviations and cadence.  Transferred to chair in hallway  due to fatigue.  Chair to wc w/min assist.  wc to bed w/min assist.  Sit to supine w/supervision and cues.  Repositioned in supine w/cues. Pt left supine w/rails up x 4, alarm set, bed in lowest position, and needs in reach.  Therapy Documentation Precautions:  Precautions Precautions: Fall Precaution Comments: drains removed Required Braces or Orthoses: Other Brace Other Brace: L cam boot; R lace up ankle brace; has sneaker  for R foot Restrictions Weight Bearing Restrictions: No RLE Weight Bearing: Weight bearing as tolerated LLE Weight Bearing: Weight bearing as tolerated Other Position/Activity Restrictions: WBAT with braces    Therapy/Group: Individual Therapy  Callie Fielding, [place] 07/27/2020, 4:16 PM

## 2020-07-27 NOTE — Patient Care Conference (Signed)
Inpatient RehabilitationTeam Conference and Plan of Care Update Date: 07/27/2020   Time: 10:22 AM    Patient Name: Amanda Collier      Medical Record Number: [id number]  Date of Birth: 1946/03/12 Sex: Female         Room/Bed: 4W20C/4W20C-01 Payor Info: Payor: MEDICARE / Plan: MEDICARE PART A AND B / Product Type: *No Product type* /    Admit Date/Time:  07/08/2020  4:58 PM  Primary Diagnosis:  Traumatic subdural hematoma [facility])  Hospital Problems: Principal Problem:   Traumatic subdural hematoma ([place]) Active Problems:   Acute blood loss anemia   Hyponatremia   Neurogenic bowel   Neurogenic bladder   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 Alger Simons Care Coodinator Present: Loralee Pacas, LCSWA;Demetrious Rainford Creig Hines, RN, BSN, [place] Nurse Present: Renda Rolls, LPN PT Present: Canary Brim, PT OT Present: Laverle Hobby, OT SLP Present: Weston Anna, SLP PPS Coordinator present : Ileana Ladd, Burna Mortimer, SLP     Current Status/Progress Goal Weekly Team Focus  Bowel/Bladder   incontinent  keep clean  Assess toileting q shift and prn   Swallow/Nutrition/ Hydration             ADL's   (S) UB dressing, min A LB dressing, CGA-min A SPT  Supervision overall  ADL retraining, cognitive retraining, ADL transfers, family education   Mobility   Min A overall, gait 78 ft, 4 steps 1 rail  Supervision-CGA overall, gait 150 ft using LRAD, stairs min A  Activity tolerance, gait and stair training, functional mobility, balance, attention, home safety, patient/caregiver education.   Communication             Safety/Cognition/ Behavioral Observations  Supervision-Min A  Supervision  higher-level problem solving, attention and recall with use of strategies   Pain   scheduled pain medication  pain  rating <6  Assess pain q shift and prn   Skin         Assess skin q shift and prn     Discharge Planning:  home with husband  Team Discussion: Bowel issues on-going. Supervision upper body, min assist lower body with min assist goals. Min assist overall goals with PT. Questioning Camboot and need for it? SLP continues to work on higher level of attention. Patient on target to meet rehab goals: yes  *See Care Plan and progress notes for long and short-term goals.   Revisions to Treatment Plan:  MD to reach out to Ortho concerning the need for the Camboot. Will let the team know.  Teaching Needs: Continue with family education.  Current Barriers to Discharge: Incontinence and stairs, and set up of home.  Possible Resolutions to Barriers: Continue to work on bowel medication regimen, continue to pain medication regimen, and continue working on stairs.     Medical Summary Current Status: ongoing pain mgt, constipation--bowel regimen increased, linzess trial. making steady cognitive gains.  Barriers to Discharge: Medical stability   Possible Resolutions to Barriers/Weekly Focus: regular labs, bowel med regimen adjustment, pain control   Continued Need for Acute Rehabilitation Level of Care: The patient requires daily medical management by a physician with specialized training in physical medicine and rehabilitation for the following reasons: Direction of a multidisciplinary physical rehabilitation program to maximize functional independence : Yes Medical management of patient stability for increased activity during participation  in an intensive rehabilitation regime.: Yes Analysis of laboratory values and/or radiology reports with any subsequent need for medication adjustment and/or medical intervention. : Yes   I attest that I was present, lead the team conference, and concur with the assessment and plan of the team.   Cristi Loron 07/27/2020, 1:09 PM

## 2020-07-27 NOTE — Progress Notes (Signed)
Physical Therapy Session Note  Patient Details  Name: Amanda Collier MRN: [id number] Date of Birth: December 16, 1945  Today's Date: 07/27/2020 PT Individual Time: 9381-8299 PT Individual Time Calculation (min): 41 min   Short Term Goals: Week 3:  PT Short Term Goal 1 (Week 3): Patient will perform basic transfers with CGA and min cues for sequencing using LRAD. PT Short Term Goal 2 (Week 3): Patient will ambulate >100 feet min A using LRAD. PT Short Term Goal 3 (Week 3): Patient will initate stair training.  Skilled Therapeutic Interventions/Progress Updates:    Pt received supine in bed and agreeable to therapy session. Dr. Naaman Plummer notified therapy staff that based on pt's x-rays and discussion with orthopedic MD pt is cleared to WB on bilateral LEs without bracing - pt notified and educated on importance of notifying therapist if she experiences any increased pain - pt denies pain in ankles/feet during session reporting her only pain is the hematoma on her "tail bone." Pt hyper-verbose throughout therapy session requiring frequent cuing for attention to task and redirecting to increase focus on therapeutic activities.  Supine>sitting R EOB, with supervision. Stand pivot to w/c, HHA, with CGA/min assist for steadying.  Transported to/from gym in w/c for time management and energy conservation. Used tilt feature of TIS w/c to allow pressure relief on sacrum during seated rest breaks. Gait training ~19ft using RW with CGA for steadying - demos reciprocal stepping pattern though decreased B LE step lengths with decreased gait speed and decreased B LE foot clearance - pt reports overall feeling it is easier to ambulate without the braces on. Gait training ~44ft using R HHA with min assist for balance - demos increasing step length compared to with RW. Ascended/descended 8 steps using B HRs with min assist for balance/steadying - pt self-selecting a variation of step-to and reciprocal pattern on ascent but  using step-to pattern leading with R LE on descent. Transitioned to using only 1 HR in preparation for home entry and ascended/descended 4 steps x 2 using sidestepping technique with min assist for balance and min cuing for sequencing. Pt reports becoming fatigued. Transported part of way back to room then performed gait training ~47ft using R HHA with min assist for balance - continued cuing for improved step length during reciprocal pattern and pt demoing improved gait speed. Pt requests to doff pants - performed in standing with min assist. Sit>supine, using bedrails, with supervision. Pt left supine in bed with needs in reach and bed alarm on.  Therapy Documentation Precautions:  Precautions Precautions: Fall Precaution Comments: drains removed Required Braces or Orthoses: Other Brace Other Brace: no braces needed as of 07/27/2020 Restrictions Weight Bearing Restrictions: Yes RLE Weight Bearing: Weight bearing as tolerated LLE Weight Bearing: Weight bearing as tolerated Other Position/Activity Restrictions: WBAT without braces as of 07/27/2020  Pain:   Reports only pain is in her "tail bone" - unrated - provided rest breaks in TIS w/c to maintain pressure relief between activities.   Therapy/Group: Individual Therapy  Tawana Scale , PT, DPT, CSRS  07/27/2020, 4:05 PM

## 2020-07-27 NOTE — Progress Notes (Signed)
Speech Language Pathology Daily Session Note  Patient Details  Name: Amanda Collier MRN: [id number] Date of Birth: 1945-12-27  Today's Date: 07/27/2020 SLP Individual Time: 1130-1200 SLP Individual Time Calculation (min): 30 min  Short Term Goals: Week 3: SLP Short Term Goal 1 (Week 3): Patient will demonstrate functional problem solving for mildly complex tasks with Supervision verbal cues. SLP Short Term Goal 2 (Week 3): Patient will recall new, daily information with Supervision verbal and visual cues. SLP Short Term Goal 3 (Week 3): Patient will demonstrate sustained attention to functional tasks for 45 minutes with Min verbal cues for redirection.  Skilled Therapeutic Interventions: Skilled treatment session focused on cognitive goals. SLP facilitated session by re-administering the Cognistat. Patient scored WFL on all subtests with the exception of mild deficits in visual construction. However, this is an improvement since initial evaluation in which she demonstrated a severe impairment. Patient also reports an improvement in overall cognitive functioning. Patient left upright in bed with alarm on and all needs within reach. Continue with current plan of care.      Pain No reports of pain   Therapy/Group: Individual Therapy  Sian Rockers 07/27/2020, 12:02 PM

## 2020-07-27 NOTE — Plan of Care (Signed)
  Problem: Consults Goal: RH BRAIN INJURY PATIENT EDUCATION Description: Description: See Patient Education module for eduction specifics Outcome: Progressing Goal: Skin Care Protocol Initiated - if Braden Score 18 or less Description: If consults are not indicated, leave blank or document N/A Outcome: Progressing Goal: Nutrition Consult-if indicated Outcome: Progressing   Problem: RH BOWEL ELIMINATION Goal: RH STG MANAGE BOWEL WITH ASSISTANCE Description: STG Manage Bowel with mod I Assistance. Outcome: Progressing Goal: RH STG MANAGE BOWEL W/MEDICATION W/ASSISTANCE Description: STG Manage Bowel with Medication with mod I Assistance. Outcome: Progressing   Problem: RH BLADDER ELIMINATION Goal: RH STG MANAGE BLADDER WITH ASSISTANCE Description: STG Manage Bladder With mod I Assistance Outcome: Progressing Goal: RH STG MANAGE BLADDER WITH MEDICATION WITH ASSISTANCE Description: STG Manage Bladder With Medication With mod I Assistance. Outcome: Progressing   Problem: RH SKIN INTEGRITY Goal: RH STG SKIN FREE OF INFECTION/BREAKDOWN Description: Pt will be free of skin breakdown/infection prior to DC with min assist.  Outcome: Progressing Goal: RH STG ABLE TO PERFORM INCISION/WOUND CARE W/ASSISTANCE Description: STG Able To Perform Incision/Wound Care With min Assistance. Outcome: Progressing   Problem: RH SAFETY Goal: RH STG ADHERE TO SAFETY PRECAUTIONS W/ASSISTANCE/DEVICE Description: STG Adhere to Safety Precautions With cues/reminders with AD Outcome: Progressing   Problem: RH COGNITION-NURSING Goal: RH STG USES MEMORY AIDS/STRATEGIES W/ASSIST TO PROBLEM SOLVE Description: STG Uses Memory Aids/Strategies With cues/reminders Assistance to Problem Solve. Outcome: Progressing   Problem: RH PAIN MANAGEMENT Goal: RH STG PAIN MANAGED AT OR BELOW PT'S PAIN GOAL Description: Less than 3 on 0-10 scale Outcome: Progressing   Problem: RH KNOWLEDGE DEFICIT BRAIN INJURY Goal: RH  STG INCREASE KNOWLEDGE OF SELF CARE AFTER BRAIN INJURY Description: Pt will be able to demonstrate understanding of BI teaching, safety precautions, falls, and medication management with supervision assist using handouts/booklets prior to DC.  Outcome: Progressing   Problem: Consults Goal: RH BRAIN INJURY PATIENT EDUCATION Description: Description: See Patient Education module for eduction specifics Outcome: Progressing

## 2020-07-27 NOTE — Plan of Care (Signed)
  Problem: RH BOWEL ELIMINATION Goal: RH STG MANAGE BOWEL WITH ASSISTANCE Description: STG Manage Bowel with mod I Assistance. Outcome: Progressing Goal: RH STG MANAGE BOWEL W/MEDICATION W/ASSISTANCE Description: STG Manage Bowel with Medication with mod I Assistance. Outcome: Progressing   Problem: RH BLADDER ELIMINATION Goal: RH STG MANAGE BLADDER WITH ASSISTANCE Description: STG Manage Bladder With mod I Assistance Outcome: Progressing Goal: RH STG MANAGE BLADDER WITH MEDICATION WITH ASSISTANCE Description: STG Manage Bladder With Medication With mod I Assistance. Outcome: Progressing   Problem: RH SKIN INTEGRITY Goal: RH STG SKIN FREE OF INFECTION/BREAKDOWN Description: Pt will be free of skin breakdown/infection prior to DC with min assist.  Outcome: Progressing Goal: RH STG ABLE TO PERFORM INCISION/WOUND CARE W/ASSISTANCE Description: STG Able To Perform Incision/Wound Care With min Assistance. Outcome: Progressing   Problem: RH SAFETY Goal: RH STG ADHERE TO SAFETY PRECAUTIONS W/ASSISTANCE/DEVICE Description: STG Adhere to Safety Precautions With cues/reminders with AD Outcome: Progressing   Problem: RH COGNITION-NURSING Goal: RH STG USES MEMORY AIDS/STRATEGIES W/ASSIST TO PROBLEM SOLVE Description: STG Uses Memory Aids/Strategies With cues/reminders Assistance to Problem Solve. Outcome: Progressing   Problem: RH PAIN MANAGEMENT Goal: RH STG PAIN MANAGED AT OR BELOW PT'S PAIN GOAL Description: Less than 3 on 0-10 scale Outcome: Progressing

## 2020-07-27 NOTE — Progress Notes (Signed)
[facility] PHYSICAL MEDICINE & REHABILITATION PROGRESS NOTE   Subjective/Complaints: Still struggling with constipation. Only took 1/2 mg citrate apparently. Pooping out hard balls again this morning. Ongoing low back,tail bone pain  ROS: Patient denies fever, rash, sore throat, blurred vision, nausea, vomiting, diarrhea, cough, shortness of breath or chest pain,  headache, or mood change.   Objective:   DG Ankle 2 Views Left  Result Date: 07/26/2020 CLINICAL DATA:  Left ankle instability. EXAM: LEFT ANKLE - 2 VIEW COMPARISON:  None. FINDINGS: There is no evidence of fracture, dislocation, or joint effusion. There is no evidence of arthropathy or other focal bone abnormality. Soft tissues are unremarkable. IMPRESSION: Negative. Electronically Signed   By: Marijo Conception M.D.   On: 07/26/2020 14:57   DG Ankle 2 Views Right  Result Date: 07/26/2020 CLINICAL DATA:  Ankle instability EXAM: RIGHT ANKLE - 2 VIEW COMPARISON:  Radiograph 06/14/2020 FINDINGS: No acute bony abnormality. Specifically, no fracture, subluxation, or dislocation. Degenerative spurring noted throughout the ankle. Small plantar calcaneal spur. Midfoot and hindfoot alignment is grossly preserved though incompletely assessed on nondedicated, nonweightbearing films. Superficial calcifications in the medial soft tissues of the lower leg, such findings can be seen with chronic venous stasis. No significant swelling, soft tissue gas or foreign body. No sizable ankle effusion. IMPRESSION: 1. No acute bony abnormality. 2. Degenerative spurring throughout the ankle. 3. Small plantar calcaneal spur. Electronically Signed   By: Lovena Le M.D.   On: 07/26/2020 14:58   VAS [place] LOWER EXTREMITY VENOUS (DVT)  Result Date: 07/26/2020  Lower Venous DVTStudy Indications: Previous DVT left.  Risk Factors: DVT Left PTV. Comparison Study: 07/09/20 Left Performing Technologist: Griffin Basil RCT RDMS  Examination Guidelines: A complete evaluation  includes B-mode imaging, spectral Doppler, color Doppler, and power Doppler as needed of all accessible portions of each vessel. Bilateral testing is considered an integral part of a complete examination. Limited examinations for reoccurring indications may be performed as noted. The reflux portion of the exam is performed with the patient in reverse Trendelenburg.  +---------+---------------+---------+-----------+----------+--------------+ LEFT     CompressibilityPhasicitySpontaneityPropertiesThrombus Aging +---------+---------------+---------+-----------+----------+--------------+ CFV      Full           Yes      Yes                                 +---------+---------------+---------+-----------+----------+--------------+ SFJ      Full                                                        +---------+---------------+---------+-----------+----------+--------------+ FV Prox  Full                                                        +---------+---------------+---------+-----------+----------+--------------+ FV Mid   Full                                                        +---------+---------------+---------+-----------+----------+--------------+  FV DistalFull                                                        +---------+---------------+---------+-----------+----------+--------------+ PFV      Full                                                        +---------+---------------+---------+-----------+----------+--------------+ POP      Full           Yes      Yes                                 +---------+---------------+---------+-----------+----------+--------------+ PTV      Full                                                        +---------+---------------+---------+-----------+----------+--------------+ PERO     Full                                                         +---------+---------------+---------+-----------+----------+--------------+     Summary: RIGHT: - No evidence of common femoral vein obstruction.  LEFT: - There is no evidence of deep vein thrombosis in the lower extremity.  - No cystic structure found in the popliteal fossa.  *See table(s) above for measurements and observations. Electronically signed by Servando Snare MD on 07/26/2020 at 1:03:14 PM.    Final    Recent Labs    07/26/20 0630  WBC 4.2  HGB 11.9*  HCT 35.7*  PLT 301   Recent Labs    07/26/20 0630  NA 133*  K 4.2  CL 101  CO2 22  GLUCOSE 101*  BUN 10  CREATININE 0.84  CALCIUM 9.0    Intake/Output Summary (Last 24 hours) at 07/27/2020 1214 Last data filed at 07/27/2020 0900 Gross per 24 hour  Intake 300 ml  Output --  Net 300 ml     Physical Exam: Vital Signs Blood pressure 140/78, pulse 75, temperature 98.4 F (36.9 C), resp. rate 20, height 5\' 4"  (1.626 m), weight 74.8 kg, last menstrual period 11/14/2003, SpO2 99 %.  Constitutional: No distress . Vital signs reviewed. HEENT: EOMI, oral membranes moist Neck: supple Cardiovascular: RRR without murmur. No JVD    Respiratory/Chest: CTA Bilaterally without wheezes or rales. Normal effort    GI/Abdomen: BS +, non-tender, non-distended Ext: no clubbing, cyanosis, or edema Psych: pleasant and cooperative Musc: low back, sacral pain Neuro: Alert Motor: Bilateral upper extremities: 4+/5 proximal distal Bilateral lower extremities: Hip flexion, knee extension 4/5, ankle dorsiflexion 4+/5, unchanged   Assessment/Plan: 1. Functional deficits secondary to traumatic subdural hematoma which require 3+ hours per day of interdisciplinary therapy in a comprehensive inpatient rehab setting.  Physiatrist is providing close team supervision and  24 hour management of active medical problems listed below.  Physiatrist and rehab team continue to assess barriers to discharge/monitor patient progress toward functional and medical  goals  Care Tool:  Bathing  Bathing activity did not occur: Safety/medical concerns (Per MD orders ) Body parts bathed by patient: Right arm, Left arm, Chest, Abdomen, Face, Front perineal area, Buttocks   Body parts bathed by helper: Right lower leg, Left lower leg     Bathing assist Assist Level: Minimal Assistance - Patient > 75%     Upper Body Dressing/Undressing Upper body dressing   What is the patient wearing?: Pull over shirt, Bra    Upper body assist Assist Level: Minimal Assistance - Patient > 75%    Lower Body Dressing/Undressing Lower body dressing      What is the patient wearing?: Pants     Lower body assist Assist for lower body dressing: Minimal Assistance - Patient > 75%     Toileting Toileting    Toileting assist Assist for toileting: Minimal Assistance - Patient > 75%     Transfers Chair/bed transfer  Transfers assist     Chair/bed transfer assist level: Minimal Assistance - Patient > 75% Chair/bed transfer assistive device: Programmer, multimedia   Ambulation assist   Ambulation activity did not occur: Safety/medical concerns (symptomatic hypotension)  Assist level: Minimal Assistance - Patient > 75% Assistive device: No Device Max distance: 60 ft   Walk 10 feet activity   Assist  Walk 10 feet activity did not occur: Safety/medical concerns  Assist level: Minimal Assistance - Patient > 75% Assistive device: No Device   Walk 50 feet activity   Assist Walk 50 feet with 2 turns activity did not occur: Safety/medical concerns  Assist level: Minimal Assistance - Patient > 75% Assistive device: No Device    Walk 150 feet activity   Assist Walk 150 feet activity did not occur: Safety/medical concerns         Walk 10 feet on uneven surface  activity   Assist Walk 10 feet on uneven surfaces activity did not occur: Safety/medical concerns         Wheelchair     Assist Will patient use wheelchair at  discharge?: No   Wheelchair activity did not occur: Safety/medical concerns         Wheelchair 50 feet with 2 turns activity    Assist    Wheelchair 50 feet with 2 turns activity did not occur: Safety/medical concerns       Wheelchair 150 feet activity     Assist  Wheelchair 150 feet activity did not occur: Safety/medical concerns       Blood pressure 140/78, pulse 75, temperature 98.4 F (36.9 C), resp. rate 20, height 5\' 4"  (1.626 m), weight 74.8 kg, last menstrual period 11/14/2003, SpO2 99 %.  Medical Problem List and Plan: 1.  Decreased functional ability with gait abnormality secondary to large subacute subdural hematoma bilaterally with significant mass-effect.  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             -Continue CIR, making nice gains in mobility 2.  Antithrombotics: -DVT/anticoagulation: Subcutaneous heparin.  vascular study 8/27 shows age indeterminate clot left posterior tib vein. 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    -repeat u/s NEGATIVE   9/13             -antiplatelet therapy: Aspirin  81 mg twice daily 3. Pain Management/headaches: Hydrocodone scheduled 10mg  q6H since she forgets to ask for pain medications, Topamax 75 mg nightly, Depakote 250 mg daily and 750 mg nightly.   Kpad for low back/sacral pain  Added scheduled oxy IR at 0700 and 1200 to help with pain during activity  Controlled with meds on 9/14 4. Mood: Klonopin 0.5 mg nightly as needed.   -remains motivated. Occasionally anxious             -antipsychotic agents: N/A 5. Neuropsych: This patient is capable of making decisions on her own behalf.  -continue ritalin 10mg  bid for concentration 6. Skin/Wound Care: Routine skin checks 7. Fluids/Electrolytes/Nutrition: Routine I/Os 8.  Subdural hemorrhage extending throughout the length of the visualized spine  mainly dorsal and most notable in the lower thoracic segments and at the L5-S1 level.  Unclear origin  - continue consvt care  9.  Seizure prophylaxis.  Keppra decreased to 250 daily on 9/11 10.  Hypertension.  Monitor with increased mobility.  Decreased Cozaar to 50mg .   Controlled on 9/14  11.  Hyperlipidemia.  Lipitor 12.  Neurogenic bladder.  Urecholine 10 mg 3 times daily.    8/30 100k E coli in urine S to ctx/cephalexin   -9/1: keflex completed  Improvement with urecholine 25mg  tid   -OOB to void 13.  History of alcohol use. 1-2 drinks/day per pt-  Provide counseling. 14.  Left nondisplaced fracture of left lateral malleolus and nondisplaced fracture of lateral malleolus right fibula sprain of tibiofibular ligament/right ankle after recent fall 06/14/2020.     - WBAT bilateral lower extremities.    -Cam boot on left and lace up ankle brace on right.  -9/14 f/u xrays do not demonstrate ongoing fx, bone density reasonable   -will d/w ortho 15. Neurogenic bowel- incontinent  -should improve as mobility and cognitively she improves  -increased daily regimen to senkot-s 2 tabs bid  9/14 linzess started today, SSE  16.  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  Mild acute blood loss anemia  Hemoglobin 11.7 on 9/6--->11.0 9/13  Continue to monitor   LOS: 19 days A FACE TO [place] 07/27/2020, 12:14 PM

## 2020-07-27 NOTE — Progress Notes (Signed)
Occupational Therapy Session Note  Patient Details  Name: Amanda Collier MRN: [id number] Date of Birth: 1945-11-30  Today's Date: 07/27/2020 OT Individual Time: 3200-3794 OT Individual Time Calculation (min): 70 min    Short Term Goals: Week 3:  OT Short Term Goal 1 (Week 3): Pt will thread 1LE into pants with AE PRN OT Short Term Goal 2 (Week 3): Pt will complete toilet transfers with CGA with LRAD 3 out of 4 trials OT Short Term Goal 3 (Week 3): Pt will complete 2 grooming tasks in standing with CGA to demonstrate increased standing balance  Skilled Therapeutic Interventions/Progress Updates:    Pt received supine with c/o soreness in her tailbone. Pt requesting to take shower. Pt completed bed mobility to EOB with min cueing and CGA. Pt sat EOB while OT donned cam boot and ankle brace. RN administer medication. Pt completed sit > stand with min A from low EOB. Pt used Rw to complete functional mobility into bathroom with CGA. Pt transferred to toilet with CGA and attempted to void, no success. Toileting tasks with CGA. She transferred into shower onto TTB. UB bathing completed seated with supervision. LB bathing with CGA in standing, use of grab bar. Pt transferred to [facility] with CGA using RW. Pt again took several minutes to void but was unable to void BM. Pt donned bra with assist to fasten anteriorly. Shirt donned (S), pants donned with CGA, use of reacher. Cam boot and ankle brace donned. Pt returned to EOB and transitioned to supine with (S). She was left supine with all needs met, bed alarm set.   Therapy Documentation Precautions:  Precautions Precautions: Fall Precaution Comments: drains removed Required Braces or Orthoses: Other Brace Other Brace: L cam boot; R lace up ankle brace; has sneaker for R foot Restrictions Weight Bearing Restrictions: No RLE Weight Bearing: Weight bearing as tolerated LLE Weight Bearing: Weight bearing as tolerated Other Position/Activity  Restrictions: WBAT with braces  Therapy/Group: Individual Therapy  Curtis Sites 07/27/2020, 7:00 AM

## 2020-07-27 NOTE — Progress Notes (Signed)
Patient ID: Amanda Collier, female   DOB: [birth year], 74 y.o.   MRN: [id number]  SW returned phone call to pt husband Amanda Collier [phone]) who wanted to add a friend Amanda Collier down for family edu tomorrow. SW also provided updates from team conference and DME: shower seat, 3in1 BSC, hospital bed, and transport w/c. He stated he does not think a hospital bed will be needed as he thinks she may be able to use stairs at home. He states he will speak with OT on if the shower seat is needed.   SW ordered: 3in1 BSC and transport chair with [name] via parachute.   Loralee Pacas, MSW, [place] Office: [phone] Cell: [phone] Fax: [phone]

## 2020-07-28 ENCOUNTER — Ambulatory Visit (HOSPITAL_COMMUNITY): Payer: Medicare Other

## 2020-07-28 ENCOUNTER — Encounter (HOSPITAL_COMMUNITY): Payer: Medicare Other | Admitting: Speech Pathology

## 2020-07-28 ENCOUNTER — Encounter (HOSPITAL_COMMUNITY): Payer: Medicare Other

## 2020-07-28 ENCOUNTER — Inpatient Hospital Stay (HOSPITAL_COMMUNITY): Payer: Medicare Other

## 2020-07-28 MED ORDER — SORBITOL 70 % SOLN
960.0000 mL | TOPICAL_OIL | Freq: Once | ORAL | Status: AC
  Administered 2020-07-28: 960 mL via RECTAL
  Filled 2020-07-28: qty 473

## 2020-07-28 MED ORDER — SORBITOL 70 % SOLN
60.0000 mL | Status: AC
  Administered 2020-07-28: 60 mL via ORAL
  Filled 2020-07-28: qty 60

## 2020-07-28 MED ORDER — HYDROCORTISONE (PERIANAL) 2.5 % EX CREA
TOPICAL_CREAM | CUTANEOUS | Status: DC | PRN
  Filled 2020-07-28: qty 28.35

## 2020-07-28 MED ORDER — LINACLOTIDE 145 MCG PO CAPS
290.0000 ug | ORAL_CAPSULE | Freq: Every day | ORAL | Status: DC
  Administered 2020-07-29 – 2020-07-30 (×2): 290 ug via ORAL
  Filled 2020-07-28 (×2): qty 2

## 2020-07-28 NOTE — Progress Notes (Addendum)
Physical Therapy Session Note  Patient Details  Name: SYNIAH BERNE MRN: [id number] Date of Birth: [birth year]  Today's Date: 07/28/2020 PT Individual Time: 0900-1000 PT Individual Time Calculation (min): 60 min   Short Term Goals: Week 1:  PT Short Term Goal 1 (Week 1): Pt will perform bed mobility with modA PT Short Term Goal 1 - Progress (Week 1): Met PT Short Term Goal 2 (Week 1): Pt will maintain sitting EOB >5 minutes with CGA PT Short Term Goal 2 - Progress (Week 1): Met PT Short Term Goal 3 (Week 1): Pt will perform bed<>chair transfer with modA and LRAD PT Short Term Goal 3 - Progress (Week 1): Met PT Short Term Goal 4 (Week 1): Pt will initiate pre-gait and standing activities with modA and LRAD PT Short Term Goal 4 - Progress (Week 1): Met Week 2:  PT Short Term Goal 1 (Week 2): Patient will perform bed mobility with min A consitently without use of hospital bed features. PT Short Term Goal 1 - Progress (Week 2): Met PT Short Term Goal 2 (Week 2): Patient will tolerate sitting OOB >1 hour each day. PT Short Term Goal 2 - Progress (Week 2): Progressing toward goal PT Short Term Goal 3 (Week 2): Patient will perform basic transfers with min A using LRAD. PT Short Term Goal 3 - Progress (Week 2): Met PT Short Term Goal 4 (Week 2): Patient will ambulate >30 feet using LRAD with min A. PT Short Term Goal 4 - Progress (Week 2): Met Week 3:  PT Short Term Goal 1 (Week 3): Patient will perform basic transfers with CGA and min cues for sequencing using LRAD. PT Short Term Goal 2 (Week 3): Patient will ambulate >100 feet min A using LRAD. PT Short Term Goal 3 (Week 3): Patient will initate stair training.  Skilled Therapeutic Interventions/Progress Updates:    PAIN increased c/o sacral pain today w/any sitting rest breaks.  Pt provided w/rest s/l on mat as alternative to relieve pain.  Pt seen this am for session w/focus on family training.  Husband present and provided video of  homeset up which demonstrates full flight of stairs w/rail on R ascending x 5steps then on L remaining stairs to top but one rail only. Also showed planned area for sleeping on first floor currently under construction due to upstairs leak.    Pt will need to ascend/descend full flight w/single rail to access bathroom and bedroom.  Carpeted stairs and upper level of home.  Proceeded w/family training.  Husband and patient require cues to stay on task during training but agreeable and participate fully.   Demonstrated gait with and without AD > 50f each w/cga only and pt continues to demonstrate improved reciprocl gait pattern, consistency of cadence, step length and clearance.    Stair training:   Husband observed therapist guarding pt w/ascending/descending 4 stairs w/single rail on R ascending/L descending and pt performs w/cga and cues for sequencing.  Pt required rest S/L on mat due to increased pain w/sitting.  Transitions sit to supine to SL w/min assist for LEs due to pain.  Husband then practiced guarding w/therapist acting as pt and performed w/ verbal instruction/cues/feedback.  Following rest break, Husband instructed w/safe guarding w/gt w/emphasis on mild post tenency and occasional decreased clearance of RLE.  Husband demonstrated safe guarding w/gait x 364fto stairs then assisted patient up/down 4 steps w/single rail w/good guarding technique demonstrated.  Pt c/o fatigue and husband stood w/pt while therapist  retrieved wc.  Pt then transported to room.  wc to bed SPT w/min assist. Pt left supine w/rails up x 4, alarm set, bed in lowest position, and needs in reach.  Discussed questions w/pt/husband following session.  Husband pleased w/pt progress and expressed positive learning experience following FT.     Therapy Documentation Precautions:  Precautions Precautions: Fall Precaution Comments: drains removed Required Braces or Orthoses: Other Brace Other Brace: no braces  needed as of 07/27/2020 Restrictions Weight Bearing Restrictions: No RLE Weight Bearing: Weight bearing as tolerated LLE Weight Bearing: Weight bearing as tolerated Other Position/Activity Restrictions: WBAT without braces as of 07/27/2020    Therapy/Group: Individual Therapy  Callie Fielding, [place] 07/28/2020, 1:34 PM

## 2020-07-28 NOTE — Progress Notes (Signed)
PHYSICAL MEDICINE & REHABILITATION PROGRESS NOTE   Subjective/Complaints: Still feels constipated. No results with SSE. Only hard balls of stool yesterday afternoon. Otherwise doing well. Tolerated ambulation without any issues yesterday sans orthotics  ROS: Patient denies fever, rash, sore throat, blurred vision, nausea, vomiting, diarrhea, cough, shortness of breath or chest pain, joint or back pain, headache, or mood change.   Objective:   DG Ankle 2 Views Left  Result Date: 07/26/2020 CLINICAL DATA:  Left ankle instability. EXAM: LEFT ANKLE - 2 VIEW COMPARISON:  None. FINDINGS: There is no evidence of fracture, dislocation, or joint effusion. There is no evidence of arthropathy or other focal bone abnormality. Soft tissues are unremarkable. IMPRESSION: Negative. Electronically Signed   By: Marijo Conception M.D.   On: 07/26/2020 14:57   DG Ankle 2 Views Right  Result Date: 07/26/2020 CLINICAL DATA:  Ankle instability EXAM: RIGHT ANKLE - 2 VIEW COMPARISON:  Radiograph 06/14/2020 FINDINGS: No acute bony abnormality. Specifically, no fracture, subluxation, or dislocation. Degenerative spurring noted throughout the ankle. Small plantar calcaneal spur. Midfoot and hindfoot alignment is grossly preserved though incompletely assessed on nondedicated, nonweightbearing films. Superficial calcifications in the medial soft tissues of the lower leg, such findings can be seen with chronic venous stasis. No significant swelling, soft tissue gas or foreign body. No sizable ankle effusion. IMPRESSION: 1. No acute bony abnormality. 2. Degenerative spurring throughout the ankle. 3. Small plantar calcaneal spur. Electronically Signed   By: Lovena Le M.D.   On: 07/26/2020 14:58   DG Abd 1 View  Result Date: 07/28/2020 CLINICAL DATA:  Obstipation EXAM: ABDOMEN - 1 VIEW COMPARISON:  None. FINDINGS: Moderate stool burden in the rectosigmoid colon. There is a non obstructive bowel gas pattern. No supine  evidence of free air. No organomegaly or suspicious calcification. No acute bony abnormality. IMPRESSION: Moderate stool burden.  No acute findings. Electronically Signed   By: Rolm Baptise M.D.   On: 07/28/2020 08:42   VAS [place] LOWER EXTREMITY VENOUS (DVT)  Result Date: 07/26/2020  Lower Venous DVTStudy Indications: Previous DVT left.  Risk Factors: DVT Left PTV. Comparison Study: 07/09/20 Left Performing Technologist: Griffin Basil RCT RDMS  Examination Guidelines: A complete evaluation includes B-mode imaging, spectral Doppler, color Doppler, and power Doppler as needed of all accessible portions of each vessel. Bilateral testing is considered an integral part of a complete examination. Limited examinations for reoccurring indications may be performed as noted. The reflux portion of the exam is performed with the patient in reverse Trendelenburg.  +---------+---------------+---------+-----------+----------+--------------+ LEFT     CompressibilityPhasicitySpontaneityPropertiesThrombus Aging +---------+---------------+---------+-----------+----------+--------------+ CFV      Full           Yes      Yes                                 +---------+---------------+---------+-----------+----------+--------------+ SFJ      Full                                                        +---------+---------------+---------+-----------+----------+--------------+ FV Prox  Full                                                        +---------+---------------+---------+-----------+----------+--------------+  FV Mid   Full                                                        +---------+---------------+---------+-----------+----------+--------------+ FV DistalFull                                                        +---------+---------------+---------+-----------+----------+--------------+ PFV      Full                                                         +---------+---------------+---------+-----------+----------+--------------+ POP      Full           Yes      Yes                                 +---------+---------------+---------+-----------+----------+--------------+ PTV      Full                                                        +---------+---------------+---------+-----------+----------+--------------+ PERO     Full                                                        +---------+---------------+---------+-----------+----------+--------------+     Summary: RIGHT: - No evidence of common femoral vein obstruction.  LEFT: - There is no evidence of deep vein thrombosis in the lower extremity.  - No cystic structure found in the popliteal fossa.  *See table(s) above for measurements and observations. Electronically signed by Servando Snare MD on 07/26/2020 at 1:03:14 PM.    Final    Recent Labs    07/26/20 0630  WBC 4.2  HGB 11.9*  HCT 35.7*  PLT 301   Recent Labs    07/26/20 0630  NA 133*  K 4.2  CL 101  CO2 22  GLUCOSE 101*  BUN 10  CREATININE 0.84  CALCIUM 9.0    Intake/Output Summary (Last 24 hours) at 07/28/2020 0858 Last data filed at 07/28/2020 0814 Gross per 24 hour  Intake 660 ml  Output --  Net 660 ml     Physical Exam: Vital Signs Blood pressure 107/60, pulse 73, temperature 98.8 F (37.1 C), resp. rate 16, height 5\' 4"  (1.626 m), weight 74.8 kg, last menstrual period 11/14/2003, SpO2 97 %.  Constitutional: No distress . Vital signs reviewed. HEENT: EOMI, oral membranes moist Neck: supple Cardiovascular: RRR without murmur. No JVD    Respiratory/Chest: CTA Bilaterally without wheezes or rales. Normal effort    GI/Abdomen: BS +, non-tender, non-distended Ext: no clubbing, cyanosis, or edema Psych: pleasant and  cooperative Musc: low back tenderness.  Neuro: Alert. More focused. Better memory Motor: Bilateral upper extremities: 4+/5 proximal distal Bilateral lower extremities: Hip flexion,  knee extension 4/5, ankle dorsiflexion 4+/5, unchanged   Assessment/Plan: 1. Functional deficits secondary to traumatic subdural hematoma which require 3+ hours per day of interdisciplinary therapy in a comprehensive inpatient rehab setting.  Physiatrist is providing close team supervision and 24 hour management of active medical problems listed below.  Physiatrist and rehab team continue to assess barriers to discharge/monitor patient progress toward functional and medical goals  Care Tool:  Bathing  Bathing activity did not occur: Safety/medical concerns (Per MD orders ) Body parts bathed by patient: Right arm, Left arm, Chest, Abdomen, Face, Front perineal area, Buttocks   Body parts bathed by helper: Right lower leg, Left lower leg     Bathing assist Assist Level: Minimal Assistance - Patient > 75%     Upper Body Dressing/Undressing Upper body dressing   What is the patient wearing?: Pull over shirt, Bra    Upper body assist Assist Level: Minimal Assistance - Patient > 75%    Lower Body Dressing/Undressing Lower body dressing      What is the patient wearing?: Pants     Lower body assist Assist for lower body dressing: Minimal Assistance - Patient > 75%     Toileting Toileting    Toileting assist Assist for toileting: Minimal Assistance - Patient > 75%     Transfers Chair/bed transfer  Transfers assist     Chair/bed transfer assist level: Minimal Assistance - Patient > 75% Chair/bed transfer assistive device:  (HHA)   Locomotion Ambulation   Ambulation assist   Ambulation activity did not occur: Safety/medical concerns (symptomatic hypotension)  Assist level: Minimal Assistance - Patient > 75% Assistive device: Hand held assist Max distance: 49ft   Walk 10 feet activity   Assist  Walk 10 feet activity did not occur: Safety/medical concerns  Assist level: Minimal Assistance - Patient > 75% Assistive device: Hand held assist   Walk 50 feet  activity   Assist Walk 50 feet with 2 turns activity did not occur: Safety/medical concerns  Assist level: Minimal Assistance - Patient > 75% Assistive device: Hand held assist    Walk 150 feet activity   Assist Walk 150 feet activity did not occur: Safety/medical concerns         Walk 10 feet on uneven surface  activity   Assist Walk 10 feet on uneven surfaces activity did not occur: Safety/medical concerns         Wheelchair     Assist Will patient use wheelchair at discharge?: No   Wheelchair activity did not occur: Safety/medical concerns         Wheelchair 50 feet with 2 turns activity    Assist    Wheelchair 50 feet with 2 turns activity did not occur: Safety/medical concerns       Wheelchair 150 feet activity     Assist  Wheelchair 150 feet activity did not occur: Safety/medical concerns       Blood pressure 107/60, pulse 73, temperature 98.8 F (37.1 C), resp. rate 16, height 5\' 4"  (1.626 m), weight 74.8 kg, last menstrual period 11/14/2003, SpO2 97 %.  Medical Problem List and Plan: 1.  Decreased functional ability with gait abnormality secondary to large subacute subdural hematoma bilaterally with significant mass-effect.  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             -  Continue CIR, making nice gains in mobility 2.  Antithrombotics: -DVT/anticoagulation: Subcutaneous heparin.  vascular study 8/27 shows age indeterminate clot left posterior tib vein. 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    -repeat u/s NEGATIVE   9/13             -antiplatelet therapy: Aspirin 81 mg twice daily 3. Pain Management/headaches: Hydrocodone scheduled 10mg  q6H since she forgets to ask for pain medications, Topamax 75 mg nightly, Depakote 250 mg daily and 750 mg nightly.   Kpad for low back/sacral pain  scheduled oxy IR at 0700  and 1200 to help with pain during activity  Controlled with meds on 9/14 4. Mood: Klonopin 0.5 mg nightly as needed.   -remains motivated. Occasionally anxious             -antipsychotic agents: N/A 5. Neuropsych: This patient is capable of making decisions on her own behalf.  -continue ritalin 10mg  bid for concentration 6. Skin/Wound Care: Routine skin checks 7. Fluids/Electrolytes/Nutrition: Routine I/Os 8.  Subdural hemorrhage extending throughout the length of the visualized spine mainly dorsal and most notable in the lower thoracic segments and at the L5-S1 level.  Unclear origin  - continue consvt care  9.  Seizure prophylaxis.  Keppra decreased to 250 daily on 9/11 10.  Hypertension.  Monitor with increased mobility.  Decreased Cozaar to 50mg .   Controlled on 9/15 11.  Hyperlipidemia.  Lipitor 12.  Neurogenic bladder.  Urecholine 10 mg 3 times daily.    8/30 100k E coli in urine S to ctx/cephalexin   -9/1: keflex completed  Improvement with urecholine 25mg  tid   -OOB to void 13.  History of alcohol use. 1-2 drinks/day per pt-  Provide counseling. 14.  Left nondisplaced fracture of left lateral malleolus and nondisplaced fracture of lateral malleolus right fibula sprain of tibiofibular ligament/right ankle after recent fall 06/14/2020.     - WBAT bilateral lower extremities.    -xrays reviewed, discussed with ortho. Orthotics off  -9/15 pt did very well with wb without braces yesterday 15. Neurogenic bowel- incontinent  -should improve as mobility and cognitively she improves  -increased daily regimen to senkot-s 2 tabs bid  9/15: KUB reviewed, distal colon stool burden  -sorbitol, SMOG enema today  - linzess increased to 290  16.  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  Mild acute blood loss anemia  Hemoglobin 11.7 on 9/6--->11.0 9/13  Continue to monitor   LOS: 20 days A FACE TO FACE EVALUATION  WAS PERFORMED  Meredith Staggers 07/28/2020, 8:58 AM

## 2020-07-28 NOTE — Progress Notes (Signed)
Occupational Therapy Session Note  Patient Details  Name: Amanda Collier MRN: [id number] Date of Birth: Jun 03, 1946  Today's Date: 07/28/2020 OT Individual Time: 3016-0109 OT Individual Time Calculation (min): 54 min    Short Term Goals: Week 1:  OT Short Term Goal 1 (Week 1): Pt will don shirt wiht MIN A OT Short Term Goal 1 - Progress (Week 1): Met OT Short Term Goal 2 (Week 1): Pt will thread 1LE into pants OT Short Term Goal 2 - Progress (Week 1): Progressing toward goal OT Short Term Goal 3 (Week 1): PT will sit to stnad with MOD A of 1 wiht LRAD in prep for LB dressing OT Short Term Goal 3 - Progress (Week 1): Met OT Short Term Goal 4 (Week 1): Pt will sequence oral care with no VC OT Short Term Goal 4 - Progress (Week 1): Met OT Short Term Goal 5 (Week 1): Pt will initate grooming with no VC OT Short Term Goal 5 - Progress (Week 1): Progressing toward goal  Skilled Therapeutic Interventions/Progress Updates:    1:1. Pt received in toilet 8/10 with pain in tailbone. Husband, jeff, present for family training. Per MD/PT note from yesterday no bracing required on BLE after review of xray. Pt being taken down for xray of abdomen therefore OT transferred pt via stedy back to bed after no success toileting. OT and husband discuss at length, DME (videos shown of home), transfer methods, CLOF, getting [place] aides, ELOS and current challenges with construction on home. Husband would like to have a family conference ot sit down with the team to discuss concerns with DC. OT will follow up with MD for availability. Pt returned to room and OT assist with pt threading BLE into pants with A only for donning L sock. Edu re donning cloting in supine if pain is severe and STS for CM. Exited session with pt seated in bed,e xit laar on and call light in reach  Therapy Documentation Precautions:  Precautions Precautions: Fall Precaution Comments: drains removed Required Braces or Orthoses: Other  Brace Other Brace: no braces needed as of 07/27/2020 Restrictions Weight Bearing Restrictions: No RLE Weight Bearing: Weight bearing as tolerated LLE Weight Bearing: Weight bearing as tolerated Other Position/Activity Restrictions: WBAT without braces as of 07/27/2020 General:   Vital Signs: Therapy Vitals Temp: 98.8 F (37.1 C) Pulse Rate: 73 BP: 107/60 Patient Position (if appropriate): Lying Oxygen Therapy SpO2: 97 % O2 Device: Room Air Pain:   ADL: ADL Grooming: Minimal assistance Where Assessed-Grooming: Sitting at sink Upper Body Bathing: Unable to assess Lower Body Bathing: Unable to assess Upper Body Dressing: Maximal assistance Where Assessed-Upper Body Dressing: Edge of bed Lower Body Dressing: Maximal assistance Where Assessed-Lower Body Dressing: Edge of bed, Bed level Toileting: Maximal assistance Where Assessed-Toileting: Bedside Commode Toilet Transfer: Dependent (stedy) Toilet Transfer Equipment: Drop arm bedside commode Vision   Perception    Praxis   Exercises:   Other Treatments:     Therapy/Group: Individual Therapy  Tonny Branch 07/28/2020, 7:59 AM

## 2020-07-28 NOTE — Progress Notes (Signed)
Pt received soap suds enema. Pt only able to tolerate a little less than 570mls. Pt did not have immediate results. Will continue to monitor.

## 2020-07-28 NOTE — Progress Notes (Signed)
Speech Language Pathology Daily Session Note  Patient Details  Name: Amanda Collier MRN: [id number] Date of Birth: [birth year]  Today's Date: 07/28/2020 SLP Individual Time: 1000-1055 SLP Individual Time Calculation (min): 55 min  Short Term Goals: Week 3: SLP Short Term Goal 1 (Week 3): Patient will demonstrate functional problem solving for mildly complex tasks with Supervision verbal cues. SLP Short Term Goal 2 (Week 3): Patient will recall new, daily information with Supervision verbal and visual cues. SLP Short Term Goal 3 (Week 3): Patient will demonstrate sustained attention to functional tasks for 45 minutes with Min verbal cues for redirection.  Skilled Therapeutic Interventions: Skilled treatment session focused on patient and family education. The patient and her husband were educated on patient's current cognitive impairments and strategies to utilize at home to maximize her attention and short-term memory. Handouts were also given to reinforce information. Patient's husband verbalized understanding but continues to demonstrate some hesitations about the amount of care the patient will initially require at home. Will continue to provided support and ongoing education. Patient left upright in bed with alarm on and all needs within reach. Continue with current plan of care.      Pain No/Denies Pain   Therapy/Group: Individual Therapy  Elroy Schembri 07/28/2020, 12:57 PM

## 2020-07-28 NOTE — Progress Notes (Signed)
Occupational Therapy Session Note  Patient Details  Name: Amanda Collier MRN: [id number] Date of Birth: 15-Sep-1946  Today's Date: 07/28/2020 OT Individual Time: 1455-1525 OT Individual Time Calculation (min): 30 min    Short Term Goals: Week 1:  OT Short Term Goal 1 (Week 1): Pt will don shirt wiht MIN A OT Short Term Goal 1 - Progress (Week 1): Met OT Short Term Goal 2 (Week 1): Pt will thread 1LE into pants OT Short Term Goal 2 - Progress (Week 1): Progressing toward goal OT Short Term Goal 3 (Week 1): PT will sit to stnad with MOD A of 1 wiht LRAD in prep for LB dressing OT Short Term Goal 3 - Progress (Week 1): Met OT Short Term Goal 4 (Week 1): Pt will sequence oral care with no VC OT Short Term Goal 4 - Progress (Week 1): Met OT Short Term Goal 5 (Week 1): Pt will initate grooming with no VC OT Short Term Goal 5 - Progress (Week 1): Progressing toward goal  Skilled Therapeutic Interventions/Progress Updates:    1:1. Pt received in bed agreeable to OT. Pt requires bed rail use to get up OOB from flat surface. Pt transfers with CGA from EOB<>w/c. Pt completes massed practice of shower stall transfers using posterior method with CGA and VC for RW management/aligning RW with shower ledge prior to stepping over and sitting on BSC. Exited session with pt seated in bed, exi talarm on and call light in reach  Therapy Documentation Precautions:  Precautions Precautions: Fall Precaution Comments: drains removed Required Braces or Orthoses: Other Brace Other Brace: no braces needed as of 07/27/2020 Restrictions Weight Bearing Restrictions: No RLE Weight Bearing: Weight bearing as tolerated LLE Weight Bearing: Weight bearing as tolerated Other Position/Activity Restrictions: WBAT without braces as of 07/27/2020 General:   Vital Signs:   Pain:   ADL: ADL Grooming: Minimal assistance Where Assessed-Grooming: Sitting at sink Upper Body Bathing: Unable to assess Lower Body  Bathing: Unable to assess Upper Body Dressing: Maximal assistance Where Assessed-Upper Body Dressing: Edge of bed Lower Body Dressing: Maximal assistance Where Assessed-Lower Body Dressing: Edge of bed, Bed level Toileting: Maximal assistance Where Assessed-Toileting: Bedside Commode Toilet Transfer: Dependent (stedy) Toilet Transfer Equipment: Drop arm bedside commode Vision   Perception    Praxis   Exercises:   Other Treatments:     Therapy/Group: Individual Therapy  Tonny Branch 07/28/2020, 12:49 PM

## 2020-07-28 NOTE — Progress Notes (Signed)
Patient unable to tolerate the whole bottle of SMOG enema ; she was able to tolerate half of the bottle. She had liquid  stools  moderate in amount.

## 2020-07-28 NOTE — Plan of Care (Signed)
  Problem: RH BOWEL ELIMINATION Goal: RH STG MANAGE BOWEL WITH ASSISTANCE Description: STG Manage Bowel with mod I Assistance. Outcome: Progressing; patient had a big bowel movement after enema and sorbitol at 1700

## 2020-07-29 ENCOUNTER — Encounter (HOSPITAL_COMMUNITY): Payer: Medicare Other | Admitting: Speech Pathology

## 2020-07-29 ENCOUNTER — Inpatient Hospital Stay (HOSPITAL_COMMUNITY): Payer: Medicare Other

## 2020-07-29 ENCOUNTER — Ambulatory Visit (HOSPITAL_COMMUNITY): Payer: Medicare Other

## 2020-07-29 ENCOUNTER — Encounter (HOSPITAL_COMMUNITY): Payer: Medicare Other

## 2020-07-29 MED ORDER — OXYCODONE HCL 5 MG PO TABS: 5.0000 mg | ORAL_TABLET | Freq: Three times a day (TID) | ORAL | Status: DC | PRN

## 2020-07-29 NOTE — Progress Notes (Signed)
Pt refusing some of her oral medications. Pt states "after the enema, I vomited during the night". Pt had no vomiting during this night shift, but pt is still nauseated. This nurse offered pt zofran multiple times through the night and pt is refusing.

## 2020-07-29 NOTE — Progress Notes (Signed)
Speech Language Pathology Daily Session Note  Patient Details  Name: Amanda Collier MRN: [id number] Date of Birth: 31-Dec-1945  Today's Date: 07/29/2020 SLP Individual Time: 1000-1055 SLP Individual Time Calculation (min): 55 min  Short Term Goals: Week 3: SLP Short Term Goal 1 (Week 3): Patient will demonstrate functional problem solving for mildly complex tasks with Supervision verbal cues. SLP Short Term Goal 2 (Week 3): Patient will recall new, daily information with Supervision verbal and visual cues. SLP Short Term Goal 3 (Week 3): Patient will demonstrate sustained attention to functional tasks for 45 minutes with Min verbal cues for redirection.  Skilled Therapeutic Interventions: Skilled treatment session focused on ongoing education with the patient and her husband. Today's session focused on the importance of establishing a routine to maximize carryover of information but to also making planning for assistance from other caregivers easier. Both verbalized understanding but  SLP needed to assist in establishing a routine that was beneficial to both the patient and her husband. Education also focused on organization like meal planning in order to maximize ease of meals and reduce amount of trips needed out of the house to the store. The idea of several small meals was also introduced as patient has been demonstrating decreased appetite and overall PO intake. Patient left upright in bed with alarm on and all needs within reach. Continue with current plan of care.      Pain Pain Assessment Pain Scale: 0-10 Pain Score: 0-No pain  Therapy/Group: Individual Therapy  Amanda Collier 07/29/2020, 12:31 PM

## 2020-07-29 NOTE — Progress Notes (Signed)
Physical Therapy Session Note  Patient Details  Name: Amanda Collier MRN: [id number] Date of Birth: 06-06-1946  Today's Date: 07/29/2020 PT Individual Time: 8675-4492 PT Individual Time Calculation (min): 25 min   Short Term Goals: Week 3:  PT Short Term Goal 1 (Week 3): Patient will perform basic transfers with CGA and min cues for sequencing using LRAD. PT Short Term Goal 2 (Week 3): Patient will ambulate >100 feet min A using LRAD. PT Short Term Goal 3 (Week 3): Patient will initate stair training.  Skilled Therapeutic Interventions/Progress Updates:     Pt received supine in bed and agreeable to therapy. No complaint of pain. Supine to sit with supervision. Sit to stand and stand step transfer to TIS WC with RW and CGA. WC transport to gym for time management. Pt ambulates 100' with RW and CGA, with cues for upright gaze and increased bilateral stride length. Pt progresses to ambulating without AD for increased balance challenges. x80' and x100' with min/CGA at hips to encourage lateral weight shifting. Pt gait mechanics deteriorate somewhat when pt becomes distracted, with shortened stride lengths and pt reaching for wall and furniture. Pt reports episode of incontinence (had enema earlier today) and requests to return to room. Left supine in bed with alarm intact and all needs within reach.  Therapy Documentation Precautions:  Precautions Precautions: Fall Precaution Comments: drains removed Required Braces or Orthoses: Other Brace Other Brace: no braces needed as of 07/27/2020 Restrictions Weight Bearing Restrictions: No RLE Weight Bearing: Weight bearing as tolerated LLE Weight Bearing: Weight bearing as tolerated Other Position/Activity Restrictions: WBAT without braces as of 07/27/2020   Therapy/Group: Individual Therapy  Breck Coons, PT, DPT 07/29/2020, 3:35 PM

## 2020-07-29 NOTE — Progress Notes (Addendum)
Patient ID: TIFFANI KADOW, female   DOB: 31-Oct-1946, 74 y.o.   MRN: [id number]  Per OT family would like to know more about sitter services.   SW met with pt and pt husband in room to provide private sitter list. SW also disused [facility] preference. Husband to review and will follow-up with SW. Pt husband also states friend Shirlean Mylar will be here from family edu tomorrow as well. SW informed on DME ordered, and company being [employer]. Pt will utilize upstairs bathroom so a hospital bed and TTB will not be needed at discharge.  *SW received updates from pt husband stating preferred [facility] are [facility] or [facility].   SW sent referral to [facility]. SW waiting on follow-up.   Loralee Pacas, MSW, [place] Office: [phone] Cell: [phone] Fax: [phone]

## 2020-07-29 NOTE — Progress Notes (Signed)
[facility] PHYSICAL MEDICINE & REHABILITATION PROGRESS NOTE   Subjective/Complaints: Had results yesterday finally with SMOG enema. Having some after-effects still but feels better overall. We discussed the effects of opioids on constipation. Pt wants to reduce these. Husband had questions about follow up, imaging etc.   ROS: Patient denies fever, rash, sore throat, blurred vision, nausea, vomiting, diarrhea, cough, shortness of breath or chest pain,   headache, or mood change.  Objective:   DG Abd 1 View  Result Date: 07/28/2020 CLINICAL DATA:  Obstipation EXAM: ABDOMEN - 1 VIEW COMPARISON:  None. FINDINGS: Moderate stool burden in the rectosigmoid colon. There is a non obstructive bowel gas pattern. No supine evidence of free air. No organomegaly or suspicious calcification. No acute bony abnormality. IMPRESSION: Moderate stool burden.  No acute findings. Electronically Signed   By: Rolm Baptise M.D.   On: 07/28/2020 08:42   No results for input(s): WBC, HGB, HCT, PLT in the last 72 hours. No results for input(s): NA, K, CL, CO2, GLUCOSE, BUN, CREATININE, CALCIUM in the last 72 hours.  Intake/Output Summary (Last 24 hours) at 07/29/2020 1118 Last data filed at 07/29/2020 1008 Gross per 24 hour  Intake 110 ml  Output --  Net 110 ml     Physical Exam: Vital Signs Blood pressure 112/72, pulse 95, temperature 97.9 F (36.6 C), temperature source Oral, resp. rate 20, height 5\' 4"  (1.626 m), weight 74.8 kg, last menstrual period 11/14/2003, SpO2 97 %.  Constitutional: No distress . Vital signs reviewed. HEENT: EOMI, oral membranes moist Neck: supple Cardiovascular: RRR without murmur. No JVD    Respiratory/Chest: CTA Bilaterally without wheezes or rales. Normal effort    GI/Abdomen: good active BS +, non-tender, non-distended Ext: no clubbing, cyanosis, or edema Psych: pleasant and cooperative Musc: low back, sacral tenderness.  Neuro: Alert. More focused. improving memory Motor:  Bilateral upper extremities: 4+/5 proximal distal Bilateral lower extremities: Hip flexion, knee extension 4/5, ankle dorsiflexion 4+/5, unchanged   Assessment/Plan: 1. Functional deficits secondary to traumatic subdural hematoma which require 3+ hours per day of interdisciplinary therapy in a comprehensive inpatient rehab setting.  Physiatrist is providing close team supervision and 24 hour management of active medical problems listed below.  Physiatrist and rehab team continue to assess barriers to discharge/monitor patient progress toward functional and medical goals  Care Tool:  Bathing  Bathing activity did not occur: Safety/medical concerns (Per MD orders ) Body parts bathed by patient: Right arm, Left arm, Chest, Abdomen, Face, Front perineal area, Buttocks   Body parts bathed by helper: Right lower leg, Left lower leg     Bathing assist Assist Level: Minimal Assistance - Patient > 75%     Upper Body Dressing/Undressing Upper body dressing   What is the patient wearing?: Pull over shirt, Bra    Upper body assist Assist Level: Minimal Assistance - Patient > 75%    Lower Body Dressing/Undressing Lower body dressing      What is the patient wearing?: Pants     Lower body assist Assist for lower body dressing: Minimal Assistance - Patient > 75%     Toileting Toileting    Toileting assist Assist for toileting: Minimal Assistance - Patient > 75%     Transfers Chair/bed transfer  Transfers assist     Chair/bed transfer assist level: Minimal Assistance - Patient > 75% Chair/bed transfer assistive device:  (HHA)   Locomotion Ambulation   Ambulation assist   Ambulation activity did not occur: Safety/medical concerns (symptomatic hypotension)  Assist level: Contact Guard/Touching assist Assistive device: Walker-rolling Max distance: 150   Walk 10 feet activity   Assist  Walk 10 feet activity did not occur: Safety/medical concerns  Assist level:  Contact Guard/Touching assist Assistive device: Walker-rolling   Walk 50 feet activity   Assist Walk 50 feet with 2 turns activity did not occur: Safety/medical concerns  Assist level: Contact Guard/Touching assist Assistive device: Walker-rolling    Walk 150 feet activity   Assist Walk 150 feet activity did not occur: Safety/medical concerns  Assist level: Contact Guard/Touching assist Assistive device: Walker-rolling    Walk 10 feet on uneven surface  activity   Assist Walk 10 feet on uneven surfaces activity did not occur: Safety/medical concerns   Assist level: Contact Guard/Touching assist Assistive device: Aeronautical engineer Will patient use wheelchair at discharge?: No   Wheelchair activity did not occur: Safety/medical concerns         Wheelchair 50 feet with 2 turns activity    Assist    Wheelchair 50 feet with 2 turns activity did not occur: Safety/medical concerns       Wheelchair 150 feet activity     Assist  Wheelchair 150 feet activity did not occur: Safety/medical concerns       Blood pressure 112/72, pulse 95, temperature 97.9 F (36.6 C), temperature source Oral, resp. rate 20, height 5\' 4"  (1.626 m), weight 74.8 kg, last menstrual period 11/14/2003, SpO2 97 %.  Medical Problem List and Plan: 1.  Decreased functional ability with gait abnormality secondary to large subacute subdural hematoma bilaterally with significant mass-effect.  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             -Continue CIR, making nice gains in mobility  -reviewed treatment/medical considerations with pt/husband today 2.  Antithrombotics: -DVT/anticoagulation: Subcutaneous heparin.  vascular study 8/27 shows age indeterminate clot left posterior tib vein. 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    -repeat u/s  NEGATIVE   9/13             -antiplatelet therapy: Aspirin 81 mg twice daily 3. Pain Management/headaches: Hydrocodone scheduled 10mg  q6H since she forgets to ask for pain medications, Topamax 75 mg nightly, Depakote 250 mg daily and 750 mg nightly.   Kpad for low back/sacral pain  Given improvement in pain and constipation, will dc scheduled oxycodone 9/16   4. Mood: Klonopin 0.5 mg nightly as needed.   -remains motivated. Occasionally anxious             -antipsychotic agents: N/A 5. Neuropsych: This patient is capable of making decisions on her own behalf.  -continue ritalin 10mg  bid for concentration 6. Skin/Wound Care: Routine skin checks 7. Fluids/Electrolytes/Nutrition: Routine I/Os 8.  Subdural hemorrhage extending throughout the length of the visualized spine mainly dorsal and most notable in the lower thoracic segments and at the L5-S1 level.  Unclear origin  - continue consvt care  9.  Seizure prophylaxis.  Keppra decreased to 250 daily on 9/11--continue 10.  Hypertension.  Monitor with increased mobility.  Decreased Cozaar to 50mg .   Controlled on 9/15 11.  Hyperlipidemia.  Lipitor 12.  Neurogenic bladder.  Urecholine 10 mg 3 times daily.    8/30 100k E coli in urine S to ctx/cephalexin   -9/1: keflex completed  Improvement with urecholine 25mg  tid   -OOB to void   -begin  weaning urecholine tomorrow 13.  History of alcohol use. 1-2 drinks/day per pt-  Provide counseling. 14.  Left nondisplaced fracture of left lateral malleolus and nondisplaced fracture of lateral malleolus right fibula sprain of tibiofibular ligament/right ankle after recent fall 06/14/2020.     - WBAT bilateral lower extremities.    -xrays reviewed, discussed with ortho. Orthotics off  -9/15 pt did very well with wb without braces yesterday 15. Neurogenic bowel- incontinent  -should improve as mobility and cognitively she improves  -increased daily regimen to senkot-s 2 tabs bid  9/15: KUB reviewed,  distal colon stool burden  -sorbitol, SMOG enema with results  -9/16 linzess increased to 290 effective today    -weaning narcs 16.  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  Mild acute blood loss anemia  Hemoglobin 11.7 on 9/6--->11.0 9/13  Continue to monitor   LOS: 21 days A FACE TO FACE EVALUATION WAS PERFORMED  Meredith Staggers 07/29/2020, 11:18 AM

## 2020-07-29 NOTE — Progress Notes (Signed)
Physical Therapy Weekly Progress Note  Patient Details  Name: Amanda Collier MRN: [id number] Date of Birth: 06-14-1946  Beginning of progress report period: July 22, 2020 End of progress report period: July 29, 2020  Today's Date: 07/29/2020 PT Individual Time: 1115-1200 PT Individual Time Calculation (min): 45 min   Patient has met 3 of 3 short term goals.  Her husband has attended and participated in 2 family training sessions and would benefit from continued education and encouragement to provide more hands on care to pt while in rehab.  Patient continues to demonstrate the following deficits muscle weakness, decreased cardiorespiratoy endurance, impaired timing and sequencing, decreased coordination and decreased motor planning, decreased problem solving and delayed processing and decreased standing balance and decreased balance strategies and therefore will continue to benefit from skilled PT intervention to increase functional independence with mobility.  Patient progressing toward long term goals..  Continue plan of care.  PT Short Term Goals Week 1:  PT Short Term Goal 1 (Week 1): Pt will perform bed mobility with modA PT Short Term Goal 1 - Progress (Week 1): Met PT Short Term Goal 2 (Week 1): Pt will maintain sitting EOB >5 minutes with CGA PT Short Term Goal 2 - Progress (Week 1): Met PT Short Term Goal 3 (Week 1): Pt will perform bed<>chair transfer with modA and LRAD PT Short Term Goal 3 - Progress (Week 1): Met PT Short Term Goal 4 (Week 1): Pt will initiate pre-gait and standing activities with modA and LRAD PT Short Term Goal 4 - Progress (Week 1): Met Week 2:  PT Short Term Goal 1 (Week 2): Patient will perform bed mobility with min A consitently without use of hospital bed features. PT Short Term Goal 1 - Progress (Week 2): Met PT Short Term Goal 2 (Week 2): Patient will tolerate sitting OOB >1 hour each day. PT Short Term Goal 2 - Progress (Week 2):  Progressing toward goal PT Short Term Goal 3 (Week 2): Patient will perform basic transfers with min A using LRAD. PT Short Term Goal 3 - Progress (Week 2): Met PT Short Term Goal 4 (Week 2): Patient will ambulate >30 feet using LRAD with min A. PT Short Term Goal 4 - Progress (Week 2): Met Week 3:  PT Short Term Goal 1 (Week 3): Patient will perform basic transfers with CGA and min cues for sequencing using LRAD. PT Short Term Goal 2 (Week 3): Patient will ambulate >100 feet min A using LRAD. PT Short Term Goal 3 (Week 3): Patient will initate stair training. PT short Term goals week 4 = LTGs Skilled Therapeutic Interventions/Progress Updates:  PAIN Pt states that sacral pain has almost fully resolved today.  Care taken with all mobility not to aggravate.  Pt seen for focus on family training. Husband present and w/mult questions regarding bed set up, pm bathroom transfers, HHPT vs OPT, timeframe for pt requiring assistance vs becoming I.  Proceeded w/session and addressed all questions during training.     Pt supine to sit mod I w/rail.  In sitting doffs nightshirt and dons bra and shirt w/set up, assist only to secure bra clasp.  Therapist threads pants and dons shoes for time efficiency.  STS w/cga and min assist to raise pants.   SPT to wc w/min assist and verbal cues to turn w/efficient steps vs shuffling.  Pt transported to patient ed apartment for training w/bed mobility and education re assist w/nocturnal trips to bathroom once at home.  Husband stated he planned to use transport chair, but educated re:  Accessibility issues as well as ease of gait assistance w/RW for this.  Also discussed option of using BSC at night for decreased caregiver burden.  Pt performed STS and short distance gait to bed w/close supervision, cues for efficient step length.   All bed mobility performed w/supervision only and husband educated re no additional equipment recommendations due to independence.  Pt  then performed STS and short distance gait to bathroom, turn/at sink and commode, and return to wc all w cga to close supervision.  Husband asking if pt could use BR at night wihtout his assist, but educated re increased fall risk in lower light, less alert situations and he verbalized understanding.  At end of session, pt transported back to room.  SPT to bed w/cga.  Sit to supine mod I. Pt left supine w/rails up x 3, alarm set, bed in lowest position, and needs in reach.  Husband at bedside and further ed re above issues completed.     Therapy Documentation Precautions:  Precautions Precautions: Fall Precaution Comments: drains removed Required Braces or Orthoses: Other Brace Other Brace: no braces needed as of 07/27/2020 Restrictions Weight Bearing Restrictions: No RLE Weight Bearing: Weight bearing as tolerated LLE Weight Bearing: Weight bearing as tolerated Other Position/Activity Restrictions: WBAT without braces as of 07/27/2020   Therapy/Group: Individual Therapy  Callie Fielding, [place] 07/29/2020, 5:07 PM

## 2020-07-29 NOTE — Progress Notes (Signed)
Occupational Therapy Session Note  Patient Details  Name: Amanda Collier MRN: [id number] Date of Birth: [birth year]  Today's Date: 07/29/2020 OT Individual Time: 4098-1191 OT Individual Time Calculation (min): 45 min  and Today's Date: 07/29/2020 OT Missed Time: 15 Minutes Missed Time Reason: Pain   Short Term Goals: Week 1:  OT Short Term Goal 1 (Week 1): Pt will don shirt wiht MIN A OT Short Term Goal 1 - Progress (Week 1): Met OT Short Term Goal 2 (Week 1): Pt will thread 1LE into pants OT Short Term Goal 2 - Progress (Week 1): Progressing toward goal OT Short Term Goal 3 (Week 1): PT will sit to stnad with MOD A of 1 wiht LRAD in prep for LB dressing OT Short Term Goal 3 - Progress (Week 1): Met OT Short Term Goal 4 (Week 1): Pt will sequence oral care with no VC OT Short Term Goal 4 - Progress (Week 1): Met OT Short Term Goal 5 (Week 1): Pt will initate grooming with no VC OT Short Term Goal 5 - Progress (Week 1): Progressing toward goal Week 2:  OT Short Term Goal 1 (Week 2): Pt will complete oral care with set up OT Short Term Goal 1 - Progress (Week 2): Met OT Short Term Goal 2 (Week 2): Pt will transfer to toilet with MIN A and LRAD OT Short Term Goal 2 - Progress (Week 2): Met OT Short Term Goal 3 (Week 2): Pt will thread 1LE into pants with AE PRN OT Short Term Goal 3 - Progress (Week 2): Progressing toward goal OT Short Term Goal 4 (Week 2): Pt will bathe UB with no VC for sequencing OT Short Term Goal 4 - Progress (Week 2): Met  Skilled Therapeutic Interventions/Progress Updates:    1;1. Pt received in bed agreeable to standing up and cleaning from small loose BM in brief. Pt completes upine>sitting with bed rail and increased time. Pt completes sit to stand with CGA and Vc for forward weight shift. Pt cleanses buttocks and peri area with CGA overall and OT dons new brief in standing. Pt returns to bed and rest of session discussing DME, shower dimensions, home care  support, 24/7 supervision and BSC use at EOB with pt and husband. All questions answered fully and pt politely declines OOB activity wanting to finish breakfast. Exited session with pt seated in bed, exit alarm on and call light in reach  Therapy Documentation Precautions:  Precautions Precautions: Fall Precaution Comments: drains removed Required Braces or Orthoses: Other Brace Other Brace: no braces needed as of 07/27/2020 Restrictions Weight Bearing Restrictions: No RLE Weight Bearing: Weight bearing as tolerated LLE Weight Bearing: Weight bearing as tolerated Other Position/Activity Restrictions: WBAT without braces as of 07/27/2020 General:   Vital Signs:   Pain: Pain Assessment Pain Scale: 0-10 Pain Score: 0-No pain ADL: ADL Grooming: Minimal assistance Where Assessed-Grooming: Sitting at sink Upper Body Bathing: Unable to assess Lower Body Bathing: Unable to assess Upper Body Dressing: Maximal assistance Where Assessed-Upper Body Dressing: Edge of bed Lower Body Dressing: Maximal assistance Where Assessed-Lower Body Dressing: Edge of bed, Bed level Toileting: Maximal assistance Where Assessed-Toileting: Bedside Commode Toilet Transfer: Dependent (stedy) Toilet Transfer Equipment: Drop arm bedside commode Vision   Perception    Praxis   Exercises:   Other Treatments:     Therapy/Group: Individual Therapy  Tonny Branch 07/29/2020, 9:36 AM

## 2020-07-30 ENCOUNTER — Encounter (HOSPITAL_COMMUNITY): Payer: Medicare Other | Admitting: Speech Pathology

## 2020-07-30 ENCOUNTER — Inpatient Hospital Stay (HOSPITAL_COMMUNITY): Payer: Medicare Other

## 2020-07-30 ENCOUNTER — Encounter (HOSPITAL_COMMUNITY): Payer: Medicare Other

## 2020-07-30 ENCOUNTER — Ambulatory Visit (HOSPITAL_COMMUNITY): Payer: Medicare Other

## 2020-07-30 LAB — BASIC METABOLIC PANEL
Anion gap: 10 (ref 5–15)
BUN: 21 mg/dL (ref 8–23)
CO2: 21 mmol/L — ABNORMAL LOW (ref 22–32)
Calcium: 8.8 mg/dL — ABNORMAL LOW (ref 8.9–10.3)
Chloride: 100 mmol/L (ref 98–111)
Creatinine, Ser: 1.31 mg/dL — ABNORMAL HIGH (ref 0.44–1.00)
GFR calc Af Amer: 46 mL/min — ABNORMAL LOW (ref >60)
GFR calc non Af Amer: 40 mL/min — ABNORMAL LOW (ref >60)
Glucose, Bld: 130 mg/dL — ABNORMAL HIGH (ref 70–99)
Potassium: 3.4 mmol/L — ABNORMAL LOW (ref 3.5–5.1)
Sodium: 131 mmol/L — ABNORMAL LOW (ref 135–145)

## 2020-07-30 MED ORDER — BETHANECHOL CHLORIDE 10 MG PO TABS
10.0000 mg | ORAL_TABLET | Freq: Three times a day (TID) | ORAL | Status: DC
  Administered 2020-07-30 – 2020-08-02 (×9): 10 mg via ORAL
  Filled 2020-07-30 (×10): qty 1

## 2020-07-30 MED ORDER — POTASSIUM CHLORIDE CRYS ER 20 MEQ PO TBCR
20.0000 meq | EXTENDED_RELEASE_TABLET | Freq: Every day | ORAL | Status: DC
  Administered 2020-07-30 – 2020-08-04 (×6): 20 meq via ORAL
  Filled 2020-07-30 (×6): qty 1

## 2020-07-30 MED ORDER — LINACLOTIDE 145 MCG PO CAPS
145.0000 ug | ORAL_CAPSULE | Freq: Every day | ORAL | Status: DC
  Administered 2020-07-31: 145 ug via ORAL
  Filled 2020-07-30 (×4): qty 1

## 2020-07-30 NOTE — Progress Notes (Signed)
Speech Language Pathology Weekly Progress and Session Note  Patient Details  Name: Amanda Collier MRN: [id number] Date of Birth: 1946/01/01  Beginning of progress report period: July 23, 2020 End of progress report period: July 30, 2020  Today's Date: 07/30/2020 SLP Individual Time: 0830-0900 SLP Individual Time Calculation (min): 30 min  Short Term Goals: Week 3: SLP Short Term Goal 1 (Week 3): Patient will demonstrate functional problem solving for mildly complex tasks with Supervision verbal cues. SLP Short Term Goal 1 - Progress (Week 3): Met SLP Short Term Goal 2 (Week 3): Patient will recall new, daily information with Supervision verbal and visual cues. SLP Short Term Goal 3 (Week 3): Patient will demonstrate sustained attention to functional tasks for 45 minutes with Min verbal cues for redirection. SLP Short Term Goal 3 - Progress (Week 3): Met    New Short Term Goals: Week 4: SLP Short Term Goal 1 (Week 4): STGs=LTGs due to ELOS  Weekly Progress Updates: Patient has made functional gains and has met 3 of 3 STGs this reporting period. Currently, patient demonstrates behaviors consistent with a [place] Level VIII and requires overall supervision-Min A verbal cues for recall of daily information, functional problem solving and attention. Patient and family education is ongoing. Patient would benefit from continued skilled SLP intervention to maximize her cognitive functioning and overall functional independence prior to discharge.    Intensity: Minumum of 1-2 x/day, 30 to 90 minutes Frequency: 3 to 5 out of 7 days Duration/Length of Stay: 08/04/20 Treatment/Interventions: Cognitive remediation/compensation;Therapeutic Activities;Environmental controls;Cueing hierarchy;Functional tasks;Patient/family education;Internal/external aids   Daily Session  Skilled Therapeutic Interventions: Skilled treatment session focused on completion of family education with the patient  and her husband. SLP facilitated session by providing handouts to maximize organization and planning at home in regards to establishing a routine, medication management, meal planning and establishing a list of friends who could potentially assist at home. All questions were answered at this time. Patient left upright in bed with alarm on and all needs within reach. Continue with current plan of care.     Pain No/Denies Pain   Therapy/Group: Individual Therapy  Zeshan Sena 07/30/2020, 6:21 AM

## 2020-07-30 NOTE — Progress Notes (Addendum)
[place] PHYSICAL MEDICINE & REHABILITATION PROGRESS NOTE   Subjective/Complaints: Abdomen feels better after moving bowels. Had bm this morning.  Sacral pain seems better too, and she wonders if this could have been contributing to her pain. Urinating frequently  ROS: Patient denies fever, rash, sore throat, blurred vision, nausea, vomiting, diarrhea, cough, shortness of breath or chest pain,  headache, or mood change.   .  Objective:   No results found. No results for input(s): WBC, HGB, HCT, PLT in the last 72 hours. Recent Labs    07/30/20 0708  NA 131*  K 3.4*  CL 100  CO2 21*  GLUCOSE 130*  BUN 21  CREATININE 1.31*  CALCIUM 8.8*    Intake/Output Summary (Last 24 hours) at 07/30/2020 1124 Last data filed at 07/30/2020 0749 Gross per 24 hour  Intake 480 ml  Output --  Net 480 ml     Physical Exam: Vital Signs Blood pressure 102/65, pulse 76, temperature 97.6 F (36.4 C), temperature source Oral, resp. rate 17, height 5\' 4"  (1.626 m), weight 74.8 kg, last menstrual period 11/14/2003, SpO2 95 %.  Constitutional: No distress . Vital signs reviewed. HEENT: EOMI, oral membranes moist Neck: supple Cardiovascular: RRR without murmur. No JVD    Respiratory/Chest: CTA Bilaterally without wheezes or rales. Normal effort    GI/Abdomen: BS +, non-tender, non-distended Ext: no clubbing, cyanosis, or edema Psych: pleasant and cooperative Musc: improved LB/sacral pain  Neuro: Alert. Improved attention and memory Motor: Bilateral upper extremities: 4+/5 proximal distal Bilateral lower extremities: Hip flexion, knee extension 4/5, ankle dorsiflexion 4+/5--gradually improving   Assessment/Plan: 1. Functional deficits secondary to traumatic subdural hematoma which require 3+ hours per day of interdisciplinary therapy in a comprehensive inpatient rehab setting.  Physiatrist is providing close team supervision and 24 hour management of active medical problems listed  below.  Physiatrist and rehab team continue to assess barriers to discharge/monitor patient progress toward functional and medical goals  Care Tool:  Bathing  Bathing activity did not occur: Safety/medical concerns (Per MD orders ) Body parts bathed by patient: Right arm, Left arm, Chest, Abdomen, Face, Front perineal area, Buttocks   Body parts bathed by helper: Right lower leg, Left lower leg     Bathing assist Assist Level: Minimal Assistance - Patient > 75%     Upper Body Dressing/Undressing Upper body dressing   What is the patient wearing?: Pull over shirt, Bra    Upper body assist Assist Level: Minimal Assistance - Patient > 75%    Lower Body Dressing/Undressing Lower body dressing      What is the patient wearing?: Pants     Lower body assist Assist for lower body dressing: Minimal Assistance - Patient > 75%     Toileting Toileting    Toileting assist Assist for toileting: Minimal Assistance - Patient > 75%     Transfers Chair/bed transfer  Transfers assist     Chair/bed transfer assist level: Contact Guard/Touching assist Chair/bed transfer assistive device: Programmer, multimedia   Ambulation assist   Ambulation activity did not occur: Safety/medical concerns (symptomatic hypotension)  Assist level: Contact Guard/Touching assist Assistive device: Walker-rolling Max distance: 100'   Walk 10 feet activity   Assist  Walk 10 feet activity did not occur: Safety/medical concerns  Assist level: Contact Guard/Touching assist Assistive device: Walker-rolling   Walk 50 feet activity   Assist Walk 50 feet with 2 turns activity did not occur: Safety/medical concerns  Assist level: Contact Guard/Touching assist Assistive  device: Walker-rolling    Walk 150 feet activity   Assist Walk 150 feet activity did not occur: Safety/medical concerns  Assist level: Contact Guard/Touching assist Assistive device: Walker-rolling    Walk 10  feet on uneven surface  activity   Assist Walk 10 feet on uneven surfaces activity did not occur: Safety/medical concerns   Assist level: Contact Guard/Touching assist Assistive device: Aeronautical engineer Will patient use wheelchair at discharge?: No   Wheelchair activity did not occur: Safety/medical concerns         Wheelchair 50 feet with 2 turns activity    Assist    Wheelchair 50 feet with 2 turns activity did not occur: Safety/medical concerns       Wheelchair 150 feet activity     Assist  Wheelchair 150 feet activity did not occur: Safety/medical concerns       Blood pressure 102/65, pulse 76, temperature 97.6 F (36.4 C), temperature source Oral, resp. rate 17, height 5\' 4"  (1.626 m), weight 74.8 kg, last menstrual period 11/14/2003, SpO2 95 %.  Medical Problem List and Plan: 1.  Decreased functional ability with gait abnormality secondary to large subacute subdural hematoma bilaterally with significant mass-effect.  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             -Continue CIR, making nice gains in mobility  -progressing toward goals, family ed 2.  Antithrombotics: -DVT/anticoagulation: Subcutaneous heparin.  vascular study 8/27 shows age indeterminate clot left posterior tib vein. 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    -repeat u/s NEGATIVE   9/13             -antiplatelet therapy: Aspirin 81 mg twice daily 3. Pain Management/headaches:   Topamax 75 mg nightly, Depakote 250 mg daily and 750 mg nightly.   Kpad for low back/sacral pain  -oxycodone prn 4. Mood: Klonopin 0.5 mg nightly as needed.   -remains motivated. Anxiety better             -antipsychotic agents: N/A 5. Neuropsych: This patient is capable of making decisions on her own behalf.  -continue ritalin 10mg  bid for concentration 6.  Skin/Wound Care: Routine skin checks 7. Fluids/Electrolytes/Nutrition:   -mild hypokalemia 9/17--->supplement 8.  Subdural hemorrhage extending throughout the length of the visualized spine mainly dorsal and most notable in the lower thoracic segments and at the L5-S1 level.  Unclear origin  - pain improved.  -f/u imaging as outpt 9.  Seizure prophylaxis.  Keppra decreased to 250 daily on 9/11--may stop at discharge 10.  Hypertension.  Monitor with increased mobility.  Decreased Cozaar to 50mg .   Controlled on 9/17 11.  Hyperlipidemia.  Lipitor 12.  Neurogenic bladder.  Urecholine 10 mg 3 times daily.    8/30 100k E coli in urine S to ctx/cephalexin   -9/1: keflex completed  Improvement with urecholine 25mg  tid   -OOB to void   -9/17 decrease urecholine to 10mg  tid 13.  History of alcohol use. 1-2 drinks/day per pt-  Provide counseling. 14.  Left nondisplaced fracture of left lateral malleolus and nondisplaced fracture of lateral malleolus right fibula sprain of tibiofibular ligament/right ankle after recent fall 06/14/2020.     - WBAT bilateral lower extremities.    -xrays reviewed, discussed with ortho. Orthotics off  -has done very well with wb without braces yesterday 15. Neurogenic bowel/constipation:  -improved, opioid component  -  reduce linzess to 145mg  daily  -senna-s at bedtime 16.  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  Mild acute blood loss anemia  Hemoglobin 11.7 on 9/6--->11.0 9/13  F/u Monday 18. DM: well controlled with glucophage  LOS: 22 days A FACE TO [place] 07/30/2020, 11:24 AM

## 2020-07-30 NOTE — Progress Notes (Signed)
Occupational Therapy Weekly Progress Note  Patient Details  Name: Amanda Collier MRN: [id number] Date of Birth: June 26, 1946  Beginning of progress report period: July 23, 2020 End of progress report period: July 30, 2020  Today's Date: 07/30/2020 OT Individual Time:  -       Patient has met 2 of 3 short term goals.  Pt has made excellent progress over the reporting period improving to CGA for all transfers and standing ADLs. Pt remains S-set up for grooming and UB ADL tasks. Pts husband has had limited physical participation in OT family education d/t tests and pt nausea/BMs, however has had many trials of PT family edu with mobility and SLP family ed with cognition. Pt husband will continue to participate in family ed OT thoruhgout the next reporting period leading up to DC  Patient continues to demonstrate the following deficits: muscle weakness, decreased cardiorespiratoy endurance, impaired timing and sequencing, unbalanced muscle activation, decreased coordination and decreased motor planning, decreased attention to right and decreased motor planning, decreased attention, decreased awareness, decreased problem solving, decreased safety awareness and decreased memory and decreased sitting balance, decreased standing balance, decreased postural control and decreased balance strategies and therefore will continue to benefit from skilled OT intervention to enhance overall performance with BADL and iADL.  Patient progressing toward long term goals..  Continue plan of care.  OT Short Term Goals Week 2:  OT Short Term Goal 1 (Week 2): Pt will complete oral care with set up OT Short Term Goal 1 - Progress (Week 2): Met OT Short Term Goal 2 (Week 2): Pt will transfer to toilet with MIN A and LRAD OT Short Term Goal 2 - Progress (Week 2): Met OT Short Term Goal 3 (Week 2): Pt will thread 1LE into pants with AE PRN OT Short Term Goal 3 - Progress (Week 2): Progressing toward goal OT  Short Term Goal 4 (Week 2): Pt will bathe UB with no VC for sequencing OT Short Term Goal 4 - Progress (Week 2): Met Week 3:  OT Short Term Goal 1 (Week 3): Pt will thread 1LE into pants with AE PRN OT Short Term Goal 1 - Progress (Week 3): Met OT Short Term Goal 2 (Week 3): Pt will complete toilet transfers with CGA with LRAD 3 out of 4 trials OT Short Term Goal 2 - Progress (Week 3): Met OT Short Term Goal 3 (Week 3): Pt will complete 2 grooming tasks in standing with CGA to demonstrate increased standing balance OT Short Term Goal 3 - Progress (Week 3): Progressing toward goal Week 4:  OT Short Term Goal 1 (Week 4): STG=LTG d/t ELOS  Skilled Therapeutic Interventions/Progress Updates:    Pt received in TIS agreeable to group. Pain stated at beginning of session 4/10 in midback. Repositioning, rest, and alternative strategies approached in session. Pt participated in an adaptive yoga group to addresses balance, strengthening, improving ROM/flexibility, alternative pain strategies, stress reduction and improve social participation/adjustment to situation by interacting with peers. Pt requires MOD redirection when answering questions and interacting with group members d/t hyperverbal tendencies. Patient was led through warm up exercises for all 4 limbs, formal adapted seated yoga poses, meditation and breath-work to address the above deficits. Pt stated at end of session, "that was so much fun, I loved meeting everyone." Pt tolerated well and returned to room with call light in reach and all needs met.  Therapy Documentation Precautions:  Precautions Precautions: Fall Precaution Comments: drains removed Required Braces or Orthoses:  Other Brace Other Brace: no braces needed as of 07/27/2020 Restrictions Weight Bearing Restrictions: No RLE Weight Bearing: Weight bearing as tolerated LLE Weight Bearing: Weight bearing as tolerated Other Position/Activity Restrictions: WBAT without braces as of  07/27/2020 General:   Vital Signs: Therapy Vitals Temp: 97.6 F (36.4 C) Temp Source: Oral Pulse Rate: 78 BP: (!) 95/58 (rechecked manually) Patient Position (if appropriate): Lying Oxygen Therapy SpO2: 95 % O2 Device: Room Air Pain:   ADL: ADL Grooming: Minimal assistance Where Assessed-Grooming: Sitting at sink Upper Body Bathing: Unable to assess Lower Body Bathing: Unable to assess Upper Body Dressing: Maximal assistance Where Assessed-Upper Body Dressing: Edge of bed Lower Body Dressing: Maximal assistance Where Assessed-Lower Body Dressing: Edge of bed, Bed level Toileting: Maximal assistance Where Assessed-Toileting: Bedside Commode Toilet Transfer: Dependent (stedy) Toilet Transfer Equipment: Drop arm bedside commode Vision   Perception    Praxis   Exercises:   Other Treatments:     Therapy/Group: Group Therapy  Tonny Branch 07/30/2020, 6:48 AM

## 2020-07-30 NOTE — Progress Notes (Signed)
Pt refusing am medications at this time. States she will be more agreeable later.

## 2020-07-30 NOTE — Progress Notes (Signed)
Physical Therapy Session Note  Patient Details  Name: Amanda Collier MRN: [id number] Date of Birth: 01-May-1946  Today's Date: 07/30/2020 PT Individual Time: 1000-1100 and 1415-1515 PT Individual Time Calculation (min): 60 min and 75 min  Short Term Goals: Week 1:  PT Short Term Goal 1 (Week 1): Pt will perform bed mobility with modA PT Short Term Goal 1 - Progress (Week 1): Met PT Short Term Goal 2 (Week 1): Pt will maintain sitting EOB >5 minutes with CGA PT Short Term Goal 2 - Progress (Week 1): Met PT Short Term Goal 3 (Week 1): Pt will perform bed<>chair transfer with modA and LRAD PT Short Term Goal 3 - Progress (Week 1): Met PT Short Term Goal 4 (Week 1): Pt will initiate pre-gait and standing activities with modA and LRAD PT Short Term Goal 4 - Progress (Week 1): Met Week 2:  PT Short Term Goal 1 (Week 2): Patient will perform bed mobility with min A consitently without use of hospital bed features. PT Short Term Goal 1 - Progress (Week 2): Met PT Short Term Goal 2 (Week 2): Patient will tolerate sitting OOB >1 hour each day. PT Short Term Goal 2 - Progress (Week 2): Progressing toward goal PT Short Term Goal 3 (Week 2): Patient will perform basic transfers with min A using LRAD. PT Short Term Goal 3 - Progress (Week 2): Met PT Short Term Goal 4 (Week 2): Patient will ambulate >30 feet using LRAD with min A. PT Short Term Goal 4 - Progress (Week 2): Met Week 3:  PT Short Term Goal 1 (Week 3): Patient will perform basic transfers with CGA and min cues for sequencing using LRAD. PT Short Term Goal 2 (Week 3): Patient will ambulate >100 feet min A using LRAD. PT Short Term Goal 3 (Week 3): Patient will initate stair training.  Skilled Therapeutic Interventions/Progress Updates:    AM session PAIN Denies pain, states sacral pain is now resolved. Pt, husband, and family friend present today for family training w/focus on stair training.  Supine to sit w/supervision, STS w/cga,  SPT to wc w/RW w/cga.  Pt transported to stairwell for training session for best likeness to home setup. Discussed placing chair at top of stairs for pt to be able to rest  Reviewed guarding technique w/husband then pt ascended 11stairs w/single rail and min assist, standing rest required a 7 stairs and every 2 steps after due to fatigue, turn/sit to chair w/min assist for balance. Pt rested 5-6 min in sitting. Discussed plan to continue training w/this so pt can reach goal of 15 stairs. Pt descended 11 stairs w/husbancd guarding w/cues for technique, min assist. Also discussed and demonstrated option of boosting on stairs Husband assisted therapist acting as pt (due to pt fatigue) w/raising and lowering from standing to/from stair in prep for boosting, reviewed safe body mechanics/stabilizing self on stairs.   Car transfer: Using car simulator and adjusting to proper height per husband, pt able to perform car transfer using RW w/verbal cues and cga.  Husband chose to observe activity but pleasantly surprised by ease of this transition/pt progress.  Family friend raised concern w/pt returning home while work was being done/questioned if this was "best" option at this time.  Recommended that she and family discuss amongst themselves if they were considering an alternative living arrangement and to let therapist know if any changes would be made/considered.  Husband and patient did not appear to be interested in other options.  PM session PAIN  Denies pain  Pt initially supine.  Supine to sit mod I.  donns shoes in sitting w/set up.  Pt requested use of BR prior to session.  STS and gait w/RW to commode/commode transfer w/close supervision.  Pt unable to pass BM, completes dressing, personal hygiene, and transfer w/cga.  Gait w/rw x 150f w/cues to maintain consistent cadence and step length.  Gait no AD 1761fw/cga, mild increased sway w/turning but no balance loss, cues to maintain  consistent cadence and step length.  Gait efficiency is improved without AD, however stability improved w/AD.  Functional gait obstacle course including reaching to mult levels progressing to picking up objects from floor, weaving, backing and turning all w/cga to light min assit, however, pt does not always locate cones during weaving and unknowingly knocks them over approx 202 time.  Repeated obstacle course 4X w/seated rest between efforts   Balance: Alternating tapping 4in step w/min to mod assist due to post/R tendency.  Repeated 5x w/seated rest between efforts.  Standing Feet together then standing Tandem , added challenges of cervical nods/turns, and eyes closed all require cga/occas min assist due to increased sway.  Pt rests mult times during activity in sitting.  Gait 14046f/RW as above back to room and ed of session.  SPT to bed w/cues for efficiency, tends to resort back to shuffling steps w/transfers, cga.  Sit to supine w/supervision. Pt left supine w/rails up x 3, alarm set, bed in lowest position, and needs in reach.   Therapy Documentation Precautions:  Precautions Precautions: Fall Precaution Comments: drains removed Required Braces or Orthoses: Other Brace Other Brace: no braces needed as of 07/27/2020 Restrictions Weight Bearing Restrictions: No RLE Weight Bearing: Weight bearing as tolerated LLE Weight Bearing: Weight bearing as tolerated Other Position/Activity Restrictions: WBAT without braces as of 07/27/2020   BarCallie FieldingT  Therapy/Group: Individual Therapy  BarJerrilyn Cairo17/2021, 7:54 AM

## 2020-07-30 NOTE — Plan of Care (Signed)
  Problem: Consults Goal: RH BRAIN INJURY PATIENT EDUCATION Description: Description: See Patient Education module for eduction specifics Outcome: Progressing Goal: Skin Care Protocol Initiated - if Braden Score 18 or less Description: If consults are not indicated, leave blank or document N/A Outcome: Progressing Goal: Nutrition Consult-if indicated Outcome: Progressing   Problem: RH BOWEL ELIMINATION Goal: RH STG MANAGE BOWEL WITH ASSISTANCE Description: STG Manage Bowel with mod I Assistance. Outcome: Progressing Goal: RH STG MANAGE BOWEL W/MEDICATION W/ASSISTANCE Description: STG Manage Bowel with Medication with mod I Assistance. Outcome: Progressing   Problem: RH BLADDER ELIMINATION Goal: RH STG MANAGE BLADDER WITH ASSISTANCE Description: STG Manage Bladder With mod I Assistance Outcome: Progressing Goal: RH STG MANAGE BLADDER WITH MEDICATION WITH ASSISTANCE Description: STG Manage Bladder With Medication With mod I Assistance. Outcome: Progressing   Problem: RH SKIN INTEGRITY Goal: RH STG SKIN FREE OF INFECTION/BREAKDOWN Description: Pt will be free of skin breakdown/infection prior to DC with min assist.  Outcome: Progressing Goal: RH STG ABLE TO PERFORM INCISION/WOUND CARE W/ASSISTANCE Description: STG Able To Perform Incision/Wound Care With min Assistance. Outcome: Progressing   Problem: RH SAFETY Goal: RH STG ADHERE TO SAFETY PRECAUTIONS W/ASSISTANCE/DEVICE Description: STG Adhere to Safety Precautions With cues/reminders with AD Outcome: Progressing   Problem: RH COGNITION-NURSING Goal: RH STG USES MEMORY AIDS/STRATEGIES W/ASSIST TO PROBLEM SOLVE Description: STG Uses Memory Aids/Strategies With cues/reminders Assistance to Problem Solve. Outcome: Progressing   Problem: RH PAIN MANAGEMENT Goal: RH STG PAIN MANAGED AT OR BELOW PT'S PAIN GOAL Description: Less than 3 on 0-10 scale Outcome: Progressing   Problem: RH KNOWLEDGE DEFICIT BRAIN INJURY Goal: RH  STG INCREASE KNOWLEDGE OF SELF CARE AFTER BRAIN INJURY Description: Pt will be able to demonstrate understanding of BI teaching, safety precautions, falls, and medication management with supervision assist using handouts/booklets prior to DC.  Outcome: Progressing   Problem: Consults Goal: RH BRAIN INJURY PATIENT EDUCATION Description: Description: See Patient Education module for eduction specifics Outcome: Progressing

## 2020-07-31 MED ORDER — LOSARTAN POTASSIUM 50 MG PO TABS
25.0000 mg | ORAL_TABLET | Freq: Every day | ORAL | Status: DC
  Administered 2020-08-01 – 2020-08-04 (×4): 25 mg via ORAL
  Filled 2020-07-31 (×4): qty 1

## 2020-07-31 NOTE — Plan of Care (Signed)
Goals downgraded d/t balance deficits to reflect care she will have at home SMS OTR/L  Problem: RH Dressing Goal: LTG Patient will perform lower body dressing w/assist (OT) Description: LTG: Patient will perform lower body dressing with assist, with/without cues in positioning using equipment (OT) Flowsheets (Taken 07/31/2020 0644) LTG: Pt will perform lower body dressing with assistance level of: Contact Guard/Touching assist   Problem: RH Toileting Goal: LTG Patient will perform toileting task (3/3 steps) with assistance level (OT) Description: LTG: Patient will perform toileting task (3/3 steps) with assistance level (OT)  Flowsheets (Taken 07/31/2020 0644) LTG: Pt will perform toileting task (3/3 steps) with assistance level: Contact Guard/Touching assist   Problem: RH Toilet Transfers Goal: LTG Patient will perform toilet transfers w/assist (OT) Description: LTG: Patient will perform toilet transfers with assist, with/without cues using equipment (OT) Flowsheets (Taken 07/31/2020 0644) LTG: Pt will perform toilet transfers with assistance level of: Contact Guard/Touching assist   Problem: RH Tub/Shower Transfers Goal: LTG Patient will perform tub/shower transfers w/assist (OT) Description: LTG: Patient will perform tub/shower transfers with assist, with/without cues using equipment (OT) Flowsheets (Taken 07/31/2020 0644) LTG: Pt will perform tub/shower stall transfers with assistance level of: Contact Guard/Touching assist

## 2020-07-31 NOTE — Progress Notes (Addendum)
Dr. Letta Pate notified of patients low B/P's. New order to reduce losartan 50 mg daily to losartan 25 mg daily. Patient and husband notified at bedside.

## 2020-08-01 ENCOUNTER — Inpatient Hospital Stay (HOSPITAL_COMMUNITY): Payer: Medicare Other | Admitting: Physical Therapy

## 2020-08-01 ENCOUNTER — Inpatient Hospital Stay (HOSPITAL_COMMUNITY): Payer: Medicare Other | Admitting: Speech Pathology

## 2020-08-01 DIAGNOSIS — S065X0S Traumatic subdural hemorrhage without loss of consciousness, sequela: Secondary | ICD-10-CM

## 2020-08-01 MED ORDER — HYDROCODONE-ACETAMINOPHEN 5-325 MG PO TABS: 2.0000 | ORAL_TABLET | Freq: Four times a day (QID) | ORAL | Status: DC | PRN

## 2020-08-01 NOTE — Progress Notes (Signed)
Speech Language Pathology Daily Session Note  Patient Details  Name: Amanda Collier MRN: [id number] Date of Birth: [birth year]  Today's Date: 08/01/2020 SLP Individual Time: 5189-8421 SLP Individual Time Calculation (min): 33 min  Short Term Goals: Week 4: SLP Short Term Goal 1 (Week 4): STGs=LTGs due to ELOS  Skilled Therapeutic Interventions:  Pt was seen for skilled ST targeting cognitive goals.  Pt was pleasantly interactive and agreeable to participating in therapy upon therapist's arrival.  Pt reports being excited for upcoming discharge but verbalized good awareness of her current limitations and how they will impact her functional independence in the home environment, stating "I know things are going to be different."  Pt was able to identify potential barriers to discharge and verbally problem solve appropriate solutions with supervision question cues.  Pt was also able to identify at least 2 areas of impairment both physically and cognitively as targets for future therapy sessions at next level of care.  Pt reports that she and her husband both feel prepared for discharge this week.  Pt's responses became slightly tangential towards the end of today's therapy session but she was able to redirect with mod I, question fatigue impacting topic maintenance versus pt's baseline personality (pt reports she has always loved to "chit chat.") All questions were answered to pt's satisfaction at this time.  Pt was left in bed with bed alarm set and call bell within reach. Continue per current plan of care.    Pain Pain Assessment Pain Scale: 0-10 Pain Score: 0-No pain  Therapy/Group: Individual Therapy  Carlin Attridge, Selinda Orion 08/01/2020, 12:36 PM

## 2020-08-01 NOTE — Progress Notes (Signed)
[facility] PHYSICAL MEDICINE & REHABILITATION PROGRESS NOTE   Subjective/Complaints: No issues overntie, no pain c/os, states she is moving bowels continently   ROS: Patient denies CP, SOB, N/V/D .  Objective:   No results found. No results for input(s): WBC, HGB, HCT, PLT in the last 72 hours. Recent Labs    07/30/20 0708  NA 131*  K 3.4*  CL 100  CO2 21*  GLUCOSE 130*  BUN 21  CREATININE 1.31*  CALCIUM 8.8*    Intake/Output Summary (Last 24 hours) at 08/01/2020 1129 Last data filed at 08/01/2020 0815 Gross per 24 hour  Intake 380 ml  Output --  Net 380 ml     Physical Exam: Vital Signs Blood pressure 92/66, pulse 72, temperature 97.8 F (36.6 C), resp. rate 17, height 5\' 4"  (1.626 m), weight 74.8 kg, last menstrual period 11/14/2003, SpO2 99 %.   General: No acute distress Mood and affect are appropriate Heart: Regular rate and rhythm no rubs murmurs or extra sounds Lungs: Clear to auscultation, breathing unlabored, no rales or wheezes Abdomen: Positive bowel sounds, soft nontender to palpation, nondistended Extremities: No clubbing, cyanosis, or edema Skin: No evidence of breakdown, no evidence of rash   Neuro: Alert. Improved attention and memory Motor: Bilateral upper extremities: 4+/5 proximal distal Bilateral lower extremities: Hip flexion, knee extension 4/5, ankle dorsiflexion 4+/5--gradually improving   Assessment/Plan: 1. Functional deficits secondary to traumatic subdural hematoma which require 3+ hours per day of interdisciplinary therapy in a comprehensive inpatient rehab setting.  Physiatrist is providing close team supervision and 24 hour management of active medical problems listed below.  Physiatrist and rehab team continue to assess barriers to discharge/monitor patient progress toward functional and medical goals  Care Tool:  Bathing  Bathing activity did not occur: Safety/medical concerns (Per MD orders ) Body parts bathed by patient:  Right arm, Left arm, Chest, Abdomen, Face, Front perineal area, Buttocks   Body parts bathed by helper: Right lower leg, Left lower leg     Bathing assist Assist Level: Minimal Assistance - Patient > 75%     Upper Body Dressing/Undressing Upper body dressing   What is the patient wearing?: Pull over shirt, Bra    Upper body assist Assist Level: Minimal Assistance - Patient > 75%    Lower Body Dressing/Undressing Lower body dressing      What is the patient wearing?: Pants     Lower body assist Assist for lower body dressing: Minimal Assistance - Patient > 75%     Toileting Toileting    Toileting assist Assist for toileting: Minimal Assistance - Patient > 75%     Transfers Chair/bed transfer  Transfers assist     Chair/bed transfer assist level: Contact Guard/Touching assist Chair/bed transfer assistive device: Programmer, multimedia   Ambulation assist   Ambulation activity did not occur: Safety/medical concerns (symptomatic hypotension)  Assist level: Contact Guard/Touching assist Assistive device: Walker-rolling Max distance: 100'   Walk 10 feet activity   Assist  Walk 10 feet activity did not occur: Safety/medical concerns  Assist level: Contact Guard/Touching assist Assistive device: Walker-rolling   Walk 50 feet activity   Assist Walk 50 feet with 2 turns activity did not occur: Safety/medical concerns  Assist level: Contact Guard/Touching assist Assistive device: Walker-rolling    Walk 150 feet activity   Assist Walk 150 feet activity did not occur: Safety/medical concerns  Assist level: Contact Guard/Touching assist Assistive device: Walker-rolling    Walk 10 feet on uneven  surface  activity   Assist Walk 10 feet on uneven surfaces activity did not occur: Safety/medical concerns   Assist level: Contact Guard/Touching assist Assistive device: Walker-rolling   Wheelchair     Assist Will patient use wheelchair at  discharge?: No   Wheelchair activity did not occur: Safety/medical concerns         Wheelchair 50 feet with 2 turns activity    Assist    Wheelchair 50 feet with 2 turns activity did not occur: Safety/medical concerns       Wheelchair 150 feet activity     Assist  Wheelchair 150 feet activity did not occur: Safety/medical concerns       Blood pressure 92/66, pulse 72, temperature 97.8 F (36.6 C), resp. rate 17, height 5\' 4"  (1.626 m), weight 74.8 kg, last menstrual period 11/14/2003, SpO2 99 %.  Medical Problem List and Plan: 1.  Decreased functional ability with gait abnormality secondary to large subacute traumatic  subdural hematoma bilaterally with significant mass-effect.  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             -Continue CIR, making nice gains in mobility  -progressing toward goals, family ed 2.  Antithrombotics: -DVT/anticoagulation: Subcutaneous heparin.  vascular study 8/27 shows age indeterminate clot left posterior tib vein. 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    -repeat u/s NEGATIVE   9/13             -antiplatelet therapy: Aspirin 81 mg twice daily 3. Pain Management/headaches:   Topamax 75 mg nightly, Depakote 250 mg daily and 750 mg nightly.   Kpad for low back/sacral pain  -oxycodone prn 4. Mood: Klonopin 0.5 mg nightly as needed.   -remains motivated. Anxiety better             -antipsychotic agents: N/A 5. Neuropsych: This patient is capable of making decisions on her own behalf.  -continue ritalin 10mg  bid for concentration 6. Skin/Wound Care: Routine skin checks 7. Fluids/Electrolytes/Nutrition:   -mild hypokalemia 9/17--->supplement 8.  Subdural hemorrhage extending throughout the length of the visualized spine mainly dorsal and most notable in the lower thoracic segments and at the L5-S1 level.  Unclear  origin  - pain improved.  -f/u imaging as outpt 9.  Seizure prophylaxis.  Keppra decreased to 250 daily on 9/11--may stop at discharge 10.  Hypertension.  Monitor with increased mobility.  Decreased Cozaar to 50mg .   Controlled on 9/17 11.  Hyperlipidemia.  Lipitor 12.  Neurogenic bladder.  Urecholine 10 mg 3 times daily.    8/30 100k E coli in urine S to ctx/cephalexin   -9/1: keflex completed  Improvement with urecholine 25mg  tid   -OOB to void   -9/17 decrease urecholine to 10mg  tid 13.  History of alcohol use. 1-2 drinks/day per pt-  Provide counseling. 14.  Left nondisplaced fracture of left lateral malleolus and nondisplaced fracture of lateral malleolus right fibula sprain of tibiofibular ligament/right ankle after recent fall 06/14/2020.     - WBAT bilateral lower extremities.    -xrays reviewed, discussed with ortho. Orthotics off  -has done very well with wb without braces yesterday 15. Neurogenic bowel/constipation:  -improved, opioid component  -reduce linzess to 145mg  daily  -senna-s at bedtime 16.  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  Mild acute blood loss anemia  Hemoglobin 11.7 on 9/6--->11.0 9/13  F/u Monday 18. DM: well controlled with glucophage CBG prn  LOS: 24 days A FACE TO FACE EVALUATION WAS PERFORMED  Charlett Blake 08/01/2020, 11:29 AM

## 2020-08-01 NOTE — Progress Notes (Signed)
Physical Therapy Session Note  Patient Details  Name: Amanda Collier MRN: [id number] Date of Birth: 01/17/1946  Today's Date: 08/01/2020 PT Individual Time: 2458-0998 PT Individual Time Calculation (min): 72 min   Short Term Goals: Week 3:  PT Short Term Goal 1 (Week 3): Patient will perform basic transfers with CGA and min cues for sequencing using LRAD. PT Short Term Goal 2 (Week 3): Patient will ambulate >100 feet min A using LRAD. PT Short Term Goal 3 (Week 3): Patient will initate stair training.  Skilled Therapeutic Interventions/Progress Updates: Pt presented in bed with husband present agreeable to therapy. Pt denies pain but indicated urgency to use bathroom. Pt performed bed mobilty with supervision and use of bed features. Pt ambulated without AD to bathroom and performed toilet transfers with close S (+void). Pt stood from toilet close S and performed peri-care mod I. Pt then ambulated to sink to perform hand hygiene in standing then returned to EOB. After receiving mask pt performed ambulatory transfer to TIS and transported to stairwell for energy conservation. At stairwell pt ascended x 11 steps using R rail in step to pattern, pt was able to easily ascend x 7 steps then required increased time for last 4. Pt with seated rest at top of stairs with approx 3-4 min for recovery. Pt descended using L rail preferring to lead with RLE and requiring minA for sequencing and safety. Pt indicated 5/10 exertion once completed stairs. Pt then trasported to x 4 steps in rehab gym and pt was able to ascend an additional x 8 steps ascended with R rail and descending with L with pt able to more comfortably descend leading with LLE. Pt then transported to day room and performed ambulatory transfer to [employer]. Participated in 3 bouts of 15 cycles on 50cm/sec for general conditioning and BLE with emphasis on stairs. Pt required 2-3 breaks between bouts with pt stating 5/10 mBorg. Pt then ambulated  with RW back to room with CGA and PTA providing verbal cues for decreased shuffling gait. Upon returning back to room pt requesting to bathroom again. Performed toilet transfers as prior with pt emptying bowel and bladder. Pt again performing peri-care mod I and ambulated to sink for hand hygiene then returning to bed. Performed sit to supine with supervision with bed flat and pt left resting comfortably with bed alarm on, call bell within reach and nsg present.      Therapy Documentation Precautions:  Precautions Precautions: Fall Precaution Comments: drains removed Required Braces or Orthoses: Other Brace Other Brace: no braces needed as of 07/27/2020 Restrictions Weight Bearing Restrictions: Yes RLE Weight Bearing: Weight bearing as tolerated LLE Weight Bearing: Weight bearing as tolerated Other Position/Activity Restrictions: WBAT without braces as of 07/27/2020 General:   Vital Signs: Therapy Vitals Pulse Rate: 77 Resp: 17 BP: 100/62 Patient Position (if appropriate): Lying Oxygen Therapy SpO2: 95 % O2 Device: Room Air Pain: Pain Assessment Pain Scale: 0-10 Pain Score: 0-No pain Mobility:   Locomotion :    Trunk/Postural Assessment :    Balance:   Exercises:   Other Treatments:      Therapy/Group: Individual Therapy  Clarrissa Shimkus 08/01/2020, 4:20 PM

## 2020-08-02 ENCOUNTER — Inpatient Hospital Stay (HOSPITAL_COMMUNITY): Payer: Medicare Other

## 2020-08-02 ENCOUNTER — Inpatient Hospital Stay (HOSPITAL_COMMUNITY): Payer: Medicare Other | Admitting: Physical Therapy

## 2020-08-02 LAB — CBC WITH DIFFERENTIAL/PLATELET
Abs Immature Granulocytes: 0.03 10*3/uL (ref 0.00–0.07)
Basophils Absolute: 0 10*3/uL (ref 0.0–0.1)
Basophils Relative: 1 %
Eosinophils Absolute: 0.1 10*3/uL (ref 0.0–0.5)
Eosinophils Relative: 4 %
HCT: 37.3 % (ref 36.0–46.0)
Hemoglobin: 12.3 g/dL (ref 12.0–15.0)
Immature Granulocytes: 1 %
Lymphocytes Relative: 41 %
Lymphs Abs: 1.5 10*3/uL (ref 0.7–4.0)
MCH: 32.4 pg (ref 26.0–34.0)
MCHC: 33 g/dL (ref 30.0–36.0)
MCV: 98.2 fL (ref 80.0–100.0)
Monocytes Absolute: 0.6 10*3/uL (ref 0.1–1.0)
Monocytes Relative: 17 %
Neutro Abs: 1.2 10*3/uL — ABNORMAL LOW (ref 1.7–7.7)
Neutrophils Relative %: 36 %
Platelets: 307 10*3/uL (ref 150–400)
RBC: 3.8 MIL/uL — ABNORMAL LOW (ref 3.87–5.11)
RDW: 12.5 % (ref 11.5–15.5)
WBC: 3.5 10*3/uL — ABNORMAL LOW (ref 4.0–10.5)
nRBC: 0 % (ref 0.0–0.2)

## 2020-08-02 LAB — BASIC METABOLIC PANEL
Anion gap: 12 (ref 5–15)
BUN: 9 mg/dL (ref 8–23)
CO2: 22 mmol/L (ref 22–32)
Calcium: 9.2 mg/dL (ref 8.9–10.3)
Chloride: 100 mmol/L (ref 98–111)
Creatinine, Ser: 0.73 mg/dL (ref 0.44–1.00)
GFR calc Af Amer: >60 mL/min (ref >60)
GFR calc non Af Amer: >60 mL/min (ref >60)
Glucose, Bld: 111 mg/dL — ABNORMAL HIGH (ref 70–99)
Potassium: 4.6 mmol/L (ref 3.5–5.1)
Sodium: 134 mmol/L — ABNORMAL LOW (ref 135–145)

## 2020-08-02 NOTE — Progress Notes (Signed)
Speech Language Pathology Daily Session Note  Patient Details  Name: Amanda Collier MRN: [id number] Date of Birth: [birth year]  Today's Date: 08/02/2020 SLP Individual Time: 1000-1059 SLP Individual Time Calculation (min): 59 min  Short Term Goals: Week 4: SLP Short Term Goal 1 (Week 4): STGs=LTGs due to ELOS  Skilled Therapeutic Interventions: Skilled therapeutic intervention focused on cognition. Redirection frequently required  This session due to patient getting distracted by her own thoughts however pt described herself as "chatty" at baseline. Pt used her own paper to write medication name, purpose, time taken, and dosage when given this information verbally. Decreased attention during this task and pt required information to be repeated and mod A with visual and verbal cues to increase awareness of information she did not when organizing medications on paper. Deductive reasoning puzzle completed with max A verbal and visual cues initially, however cues faded to min A once patient was more familiar with task. Pt left seated upright in bed with bed alarm set and all needs within reach.      Pain Pain Assessment Pain Scale: Faces Faces Pain Scale: No hurt  Therapy/Group: Individual Therapy  Darrol Poke Shai Mckenzie 08/02/2020, 12:39 PM

## 2020-08-02 NOTE — Progress Notes (Signed)
Physical Therapy Session Note  Patient Details  Name: Amanda Collier MRN: [id number] Date of Birth: [birth year]  Today's Date: 08/02/2020 PT Individual Time: 1345-1500 PT Individual Time Calculation (min): 75 min   Short Term Goals: Week 3:  PT Short Term Goal 1 (Week 3): Patient will perform basic transfers with CGA and min cues for sequencing using LRAD. PT Short Term Goal 2 (Week 3): Patient will ambulate >100 feet min A using LRAD. PT Short Term Goal 3 (Week 3): Patient will initate stair training.  Skilled Therapeutic Interventions/Progress Updates:  Pt received seated in bed, agreeable to PT session. No complaints of pain. Pt requesting to use the bathroom. Bed mobility independent. Assisted pt with donning shoes while seated EOB. Sit to stand with Supervision to RW. Ambulation into bathroom with RW and CGA, cues for safety. Pt is independent with clothing management and pericare. Toilet transfer with Supervision and RW. Dependent transport via TIS w/c to therapy gym for energy conservation. Ascend/descend 12 stairs with 1 handrail laterally with min A for balance in stairwell to simulate home environment. Pt reports feeling fatigued following stair navigation. Nustep level 6 x 10 min with use of B UE/LE for global endurance training with focus on maintaining 40 steps/min or greater. Sidesteps L/R with RW and CGA for balance with min cueing for safe RW management x 20 ft L/R. Ambulation with no AD and CGA to min A x 50 ft, somewhat ataxic and near LOB during turn that pt is able to correct independently. Pt requests to use bathroom again at end of session. Toilet transfer with Supervision and RW. Pt independent for pericare and clothing management. Transfer back to bed with RW and Supervision. Pt left seated in bed with needs in reach, bed alarm in place at end of session.  Therapy Documentation Precautions:  Precautions Precautions: Fall Precaution Comments: drains removed Required  Braces or Orthoses: Other Brace Other Brace: no braces needed as of 07/27/2020 Restrictions Weight Bearing Restrictions: No RLE Weight Bearing: Weight bearing as tolerated LLE Weight Bearing: Weight bearing as tolerated Other Position/Activity Restrictions: WBAT without braces as of 07/27/2020   Therapy/Group: Individual Therapy   Excell Seltzer, PT, DPT  08/02/2020, 5:28 PM

## 2020-08-02 NOTE — Progress Notes (Signed)
Occupational Therapy Discharge Summary  Patient Details  Name: Amanda Collier MRN: [id number] Date of Birth: 09-Sep-1946  Today's Date: 08/03/2020 OT Individual Time: 0851-1000 OT Individual Time Calculation (min): 69 min    Patient has met 12 of 12 long term goals due to improved activity tolerance, improved balance, postural control, ability to compensate for deficits, improved attention, improved awareness and improved coordination.  Patient to discharge at overall Supervision level.  Patient's care partner is independent to provide the necessary cognitive assistance at discharge.  Pt has made excellent progress during her CIR stay. She has progressed from a max +2 with transfers to a supervision- CGA level. Cognition has cleared and pt is consistently oriented x4. Family education has been completed with pt's husband Amanda Collier.   Reasons goals not met: All treatment goals met.   Recommendation:  Patient will benefit from ongoing skilled OT services in home health setting to continue to advance functional skills in the area of BADL and Reduce care partner burden.  Equipment: BSC and shower seat  Reasons for discharge: treatment goals met and discharge from hospital  Patient/family agrees with progress made and goals achieved: Yes   Skilled OT Intervention Pt received supine with no c/o pain. Discussed d/c and f/u therapies. Pt completed bed mobility with mod I. She used RW to complete sit > stand with min cueing for hand placement 2/2 being distracted. Pt completed ambulatory transfer into the bathroom with supervision. Toileting tasks completed with supervision. Pt completed ambulatory transfer back to EOB with supervision, good RW management/safety. Pt donned shirt and bra with set up assist- improved FMC to fasten bra. Pt required increased time to don depends, as well as use of reacher. Pants donned supervision. Pt was taken via w/c to the therapy gym. She completed dynamic stepping  activity to simulate home walk in shower transfers. Slower processing and min cueing overall required. Pt returned to her room and was left supine with all needs met- bed alarm set.   OT Discharge Precautions/Restrictions  Precautions Precautions: Fall Restrictions Weight Bearing Restrictions: Yes RLE Weight Bearing: Weight bearing as tolerated LLE Weight Bearing: Weight bearing as tolerated Other Position/Activity Restrictions: WBAT without braces as of 07/27/2020    ADL ADL Eating: Modified independent Where Assessed-Eating: Edge of bed Grooming: Supervision/safety Where Assessed-Grooming: Standing at sink, Sitting at sink Upper Body Bathing: Setup Where Assessed-Upper Body Bathing: Shower Lower Body Bathing: Supervision/safety Where Assessed-Lower Body Bathing: Shower Upper Body Dressing: Supervision/safety Where Assessed-Upper Body Dressing: Chair Lower Body Dressing: Supervision/safety Where Assessed-Lower Body Dressing: Chair Toileting: Supervision/safety Where Assessed-Toileting: Glass blower/designer: Close supervision Toilet Transfer Method: Counselling psychologist: Energy manager: Close supervision Social research officer, government Method: Heritage manager: Civil engineer, contracting with back Vision Baseline Vision/History: Wears glasses Wears Glasses: Reading only;Distance only Patient Visual Report: No change from baseline Vision Assessment?: No apparent visual deficits Perception  Perception: Within Functional Limits Praxis Praxis: Intact Cognition Overall Cognitive Status: Impaired/Different from baseline Arousal/Alertness: Awake/alert Orientation Level: Oriented X4 Attention: Selective Selective Attention: Appears intact Memory: Appears intact Awareness: Appears intact Problem Solving: Impaired Problem Solving Impairment: Functional complex;Verbal complex (higher level deficits) Safety/Judgment: Appears intact Comments:  Pt with much improved cognition, still some higher level deficits in memory, processing, and problem solving [employer] Scales of Cognitive Functioning: Purposeful/appropriate Sensation Sensation Light Touch: Appears Intact Hot/Cold: Appears Intact Coordination Gross Motor Movements are Fluid and Coordinated: Yes Fine Motor Movements are Fluid and Coordinated: Yes Coordination and Movement Description:  Still with some generalized weakness Motor  Motor Motor: Other (comment) Motor - Discharge Observations: generalized weakness Mobility  Bed Mobility Bed Mobility: Rolling Right;Rolling Left;Sit to Supine;Supine to Sit Rolling Right: Supervision/verbal cueing Rolling Left: Supervision/Verbal cueing Supine to Sit: Supervision/Verbal cueing Sit to Supine: Supervision/Verbal cueing Transfers Sit to Stand: Supervision/Verbal cueing Stand to Sit: Supervision/Verbal cueing  Trunk/Postural Assessment  Cervical Assessment Cervical Assessment: Within Functional Limits Thoracic Assessment Thoracic Assessment: Within Functional Limits Lumbar Assessment Lumbar Assessment: Exceptions to [employer] (posterior pelvic tilt) Postural Control Postural Control: Deficits on evaluation (delayed)  Balance Balance Balance Assessed: Yes Static Sitting Balance Static Sitting - Balance Support: Feet supported;Right upper extremity supported Static Sitting - Level of Assistance: 6: Modified independent (Device/Increase time) Dynamic Sitting Balance Dynamic Sitting - Balance Support: No upper extremity supported Dynamic Sitting - Level of Assistance: 6: Modified independent (Device/Increase time) Static Standing Balance Static Standing - Balance Support: Bilateral upper extremity supported Static Standing - Level of Assistance: 5: Stand by assistance Dynamic Standing Balance Dynamic Standing - Balance Support: During functional activity;Bilateral upper extremity supported Dynamic Standing - Level of  Assistance: 5: Stand by assistance Extremity/Trunk Assessment RUE Assessment RUE Assessment: Within Functional Limits LUE Assessment LUE Assessment: Within Functional Limits   Curtis Sites 08/02/2020, 8:34 PM

## 2020-08-02 NOTE — Progress Notes (Signed)
[place] PHYSICAL MEDICINE & REHABILITATION PROGRESS NOTE   Subjective/Complaints: No new complaints. Bowels moving. Pain improved. Excited by progress. Bladder emptying  ROS: Patient denies fever, rash, sore throat, blurred vision, nausea, vomiting, diarrhea, cough, shortness of breath or chest pain, joint or back pain, headache, or mood change.  .  Objective:   No results found. Recent Labs    08/02/20 0744  WBC 3.5*  HGB 12.3  HCT 37.3  PLT 307   Recent Labs    08/02/20 0744  NA 134*  K 4.6  CL 100  CO2 22  GLUCOSE 111*  BUN 9  CREATININE 0.73  CALCIUM 9.2    Intake/Output Summary (Last 24 hours) at 08/02/2020 1208 Last data filed at 08/02/2020 0800 Gross per 24 hour  Intake 596 ml  Output --  Net 596 ml     Physical Exam: Vital Signs Blood pressure 107/66, pulse 74, temperature 97.9 F (36.6 C), temperature source Oral, resp. rate 17, height 5\' 4"  (1.626 m), weight 74.8 kg, last menstrual period 11/14/2003, SpO2 100 %.   Constitutional: No distress . Vital signs reviewed. HEENT: EOMI, oral membranes moist Neck: supple Cardiovascular: RRR without murmur. No JVD    Respiratory/Chest: CTA Bilaterally without wheezes or rales. Normal effort    GI/Abdomen: BS +, non-tender, non-distended Ext: no clubbing, cyanosis, or edema Psych: pleasant and cooperative  Neuro: Alert. Improved attention and memory. More focused and redirectable Motor: Bilateral upper extremities: 4+/5 proximal distal Bilateral lower extremities: Hip flexion, knee extension 4/5, ankle dorsiflexion 4+/5--gradually improving   Assessment/Plan: 1. Functional deficits secondary to traumatic subdural hematoma which require 3+ hours per day of interdisciplinary therapy in a comprehensive inpatient rehab setting.  Physiatrist is providing close team supervision and 24 hour management of active medical problems listed below.  Physiatrist and rehab team continue to assess barriers to  discharge/monitor patient progress toward functional and medical goals  Care Tool:  Bathing  Bathing activity did not occur: Safety/medical concerns (Per MD orders ) Body parts bathed by patient: Right arm, Left arm, Chest, Abdomen, Face, Front perineal area, Buttocks, Left upper leg, Right upper leg, Right lower leg, Left lower leg   Body parts bathed by helper: Right lower leg, Left lower leg     Bathing assist Assist Level: Contact Guard/Touching assist     Upper Body Dressing/Undressing Upper body dressing   What is the patient wearing?: Pull over shirt, Bra    Upper body assist Assist Level: Supervision/Verbal cueing    Lower Body Dressing/Undressing Lower body dressing      What is the patient wearing?: Pants     Lower body assist Assist for lower body dressing: Contact Guard/Touching assist     Toileting Toileting    Toileting assist Assist for toileting: Contact Guard/Touching assist     Transfers Chair/bed transfer  Transfers assist     Chair/bed transfer assist level: Supervision/Verbal cueing Chair/bed transfer assistive device: Programmer, multimedia   Ambulation assist   Ambulation activity did not occur: Safety/medical concerns (symptomatic hypotension)  Assist level: Contact Guard/Touching assist Assistive device: Walker-rolling Max distance: 100'   Walk 10 feet activity   Assist  Walk 10 feet activity did not occur: Safety/medical concerns  Assist level: Contact Guard/Touching assist Assistive device: Walker-rolling   Walk 50 feet activity   Assist Walk 50 feet with 2 turns activity did not occur: Safety/medical concerns  Assist level: Contact Guard/Touching assist Assistive device: Walker-rolling    Walk 150 feet activity  Assist Walk 150 feet activity did not occur: Safety/medical concerns  Assist level: Contact Guard/Touching assist Assistive device: Walker-rolling    Walk 10 feet on uneven surface   activity   Assist Walk 10 feet on uneven surfaces activity did not occur: Safety/medical concerns   Assist level: Contact Guard/Touching assist Assistive device: Aeronautical engineer Will patient use wheelchair at discharge?: No   Wheelchair activity did not occur: Safety/medical concerns         Wheelchair 50 feet with 2 turns activity    Assist    Wheelchair 50 feet with 2 turns activity did not occur: Safety/medical concerns       Wheelchair 150 feet activity     Assist  Wheelchair 150 feet activity did not occur: Safety/medical concerns       Blood pressure 107/66, pulse 74, temperature 97.9 F (36.6 C), temperature source Oral, resp. rate 17, height 5\' 4"  (1.626 m), weight 74.8 kg, last menstrual period 11/14/2003, SpO2 100 %.  Medical Problem List and Plan: 1.  Decreased functional ability with gait abnormality secondary to large subacute traumatic  subdural hematoma bilaterally with significant mass-effect.  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             -Continue CIR, making nice gains in mobility  -progressing toward goals, family ed  -ELOS 9/22 2.  Antithrombotics: -DVT/anticoagulation: Subcutaneous heparin.  vascular study 8/27 shows age indeterminate clot left posterior tib vein. 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    -repeat u/s NEGATIVE   9/13             -antiplatelet therapy: Aspirin 81 mg twice daily 3. Pain Management/headaches:   Topamax 75 mg nightly, Depakote 250 mg daily and 750 mg nightly.   Kpad for low back/sacral pain  -oxycodone prn 4. Mood: Klonopin 0.5 mg nightly as needed.   -remains motivated. Anxiety better             -antipsychotic agents:  n/a 5. Neuropsych: This patient is capable of making decisions on her own behalf.  -continue ritalin 10mg  bid for concentration 6.  Skin/Wound Care: Routine skin checks 7. Fluids/Electrolytes/Nutrition:   -mild hypokalemia 9/17--->4.6 9/20, dc supp 8.  Subdural hemorrhage extending throughout the length of the visualized spine mainly dorsal and most notable in the lower thoracic segments and at the L5-S1 level.  Unclear origin  - pain improved.  -f/u imaging as outpt 9.  Seizure prophylaxis.  Keppra decreased to 250 daily on 9/11--may stop at discharge 10.  Hypertension.  Monitor with increased mobility.  Decreased Cozaar to 50mg .   Controlled on 9/17 11.  Hyperlipidemia.  Lipitor 12.  Neurogenic bladder.  Urecholine 10 mg 3 times daily.    8/30 100k E coli in urine S to ctx/cephalexin   -9/1: keflex completed  Improvement with urecholine 25mg  tid   -OOB to void   -9/20 dc urecholine 13.  History of alcohol use. 1-2 drinks/day per pt-  Provide counseling. 14.  Left nondisplaced fracture of left lateral malleolus and nondisplaced fracture of lateral malleolus right fibula sprain of tibiofibular ligament/right ankle after recent fall 06/14/2020.     - WBAT bilateral lower extremities.    -xrays reviewed, discussed with ortho. Orthotics off  -no pain in either leg 15. Neurogenic bowel/constipation:  -improved, opioid component  -reduce linzess to 145mg  daily  -senna-s at  bedtime 16.  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  Mild acute blood loss anemia  Hemoglobin 11.7 on 9/6--->12.3 9/20    18. DM: well controlled with glucophage CBG prn  LOS: 25 days A FACE TO FACE EVALUATION WAS PERFORMED  Meredith Staggers 08/02/2020, 12:08 PM

## 2020-08-02 NOTE — Discharge Summary (Signed)
Physician Discharge Summary  Patient ID: Amanda Collier MRN: [id number] DOB/AGE: 74-May-1947 74 y.o.  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 EXAM: LEFT ANKLE - 2 VIEW COMPARISON:  None. FINDINGS: There is no evidence of fracture, dislocation, or joint effusion. There is no evidence of arthropathy or other focal bone abnormality. Soft tissues are unremarkable. IMPRESSION: Negative. Electronically Signed   By: Marijo Conception M.D.   On: 07/26/2020 14:57   DG Ankle 2 Views Right  Result Date: 07/26/2020 CLINICAL DATA:  Ankle instability EXAM: RIGHT ANKLE - 2 VIEW COMPARISON:  Radiograph 06/14/2020 FINDINGS: No acute bony abnormality. Specifically, no fracture, subluxation, or dislocation. Degenerative spurring noted throughout the ankle. Small plantar calcaneal spur. Midfoot and hindfoot alignment is grossly preserved though incompletely assessed on nondedicated, nonweightbearing films. Superficial calcifications in the medial soft tissues of the lower leg, such findings can be seen with chronic venous stasis. No significant swelling, soft tissue gas or foreign body. No sizable ankle effusion. IMPRESSION: 1. No acute bony abnormality. 2. Degenerative spurring throughout the ankle. 3. Small  plantar calcaneal spur. Electronically Signed   By: Lovena Le M.D.   On: 07/26/2020 14:58   DG Abd 1 View  Result Date: 07/28/2020 CLINICAL DATA:  Obstipation EXAM: ABDOMEN - 1 VIEW COMPARISON:  None. FINDINGS: Moderate stool burden in the rectosigmoid colon. There is a non obstructive bowel gas pattern. No supine evidence of free air. No organomegaly or suspicious calcification. No acute bony abnormality. IMPRESSION: Moderate stool burden.  No acute findings. Electronically Signed   By: Rolm Baptise M.D.   On: 07/28/2020 08:42   VAS [place] LOWER EXTREMITY VENOUS (DVT)  Result Date: 07/26/2020  Lower Venous DVTStudy Indications: Previous DVT left.  Risk Factors: DVT Left PTV. Comparison Study: 07/09/20 Left Performing Technologist: Griffin Basil RCT RDMS  Examination Guidelines: A complete evaluation includes B-mode imaging, spectral Doppler, color Doppler, and power Doppler as needed of all accessible portions of each vessel. Bilateral testing is considered an integral part of a complete examination. Limited examinations for reoccurring indications may be performed as noted. The reflux portion of the exam is performed with the patient in reverse Trendelenburg.  +---------+---------------+---------+-----------+----------+--------------+ LEFT     CompressibilityPhasicitySpontaneityPropertiesThrombus Aging +---------+---------------+---------+-----------+----------+--------------+ CFV      Full           Yes      Yes                                 +---------+---------------+---------+-----------+----------+--------------+ SFJ      Full                                                        +---------+---------------+---------+-----------+----------+--------------+  FV Prox  Full                                                        +---------+---------------+---------+-----------+----------+--------------+ FV Mid   Full                                                         +---------+---------------+---------+-----------+----------+--------------+ FV DistalFull                                                        +---------+---------------+---------+-----------+----------+--------------+ PFV      Full                                                        +---------+---------------+---------+-----------+----------+--------------+ POP      Full           Yes      Yes                                 +---------+---------------+---------+-----------+----------+--------------+ PTV      Full                                                        +---------+---------------+---------+-----------+----------+--------------+ PERO     Full                                                        +---------+---------------+---------+-----------+----------+--------------+     Summary: RIGHT: - No evidence of common femoral vein obstruction.  LEFT: - There is no evidence of deep vein thrombosis in the lower extremity.  - No cystic structure found in the popliteal fossa.  *See table(s) above for measurements and observations. Electronically signed by Servando Snare MD on 07/26/2020 at 1:03:14 PM.    Final    VAS [place] LOWER EXTREMITY VENOUS (DVT)  Result Date: 07/09/2020  Lower Venous DVTStudy Indications: Swelling.  Comparison Study: 07-31-2017 Bilateral LEV available. Performing Technologist: Darlin Coco  Examination Guidelines: A complete evaluation includes B-mode imaging, spectral Doppler, color Doppler, and power Doppler as needed of all accessible portions of each vessel. Bilateral testing is considered an integral part of a complete examination. Limited examinations for reoccurring indications may be performed as noted. The reflux portion of the exam is performed with the patient in reverse Trendelenburg.  +---------+---------------+---------+-----------+----------+--------------+ RIGHT    CompressibilityPhasicitySpontaneityPropertiesThrombus Aging  +---------+---------------+---------+-----------+----------+--------------+ CFV      Full  Yes      Yes                                 +---------+---------------+---------+-----------+----------+--------------+ SFJ      Full                                                        +---------+---------------+---------+-----------+----------+--------------+ FV Prox  Full                                                        +---------+---------------+---------+-----------+----------+--------------+ FV Mid   Full                                                        +---------+---------------+---------+-----------+----------+--------------+ FV DistalFull                                                        +---------+---------------+---------+-----------+----------+--------------+ PFV      Full                                                        +---------+---------------+---------+-----------+----------+--------------+ POP      Full           Yes      Yes                                 +---------+---------------+---------+-----------+----------+--------------+ PTV      Full                                                        +---------+---------------+---------+-----------+----------+--------------+ PERO     Full                                                        +---------+---------------+---------+-----------+----------+--------------+   +---------+---------------+---------+-----------+----------+-----------------+ LEFT     CompressibilityPhasicitySpontaneityPropertiesThrombus Aging    +---------+---------------+---------+-----------+----------+-----------------+ CFV      Full           Yes      Yes                                    +---------+---------------+---------+-----------+----------+-----------------+  SFJ      Full                                                            +---------+---------------+---------+-----------+----------+-----------------+ FV Prox  Full                                                           +---------+---------------+---------+-----------+----------+-----------------+ FV Mid   Full                                                           +---------+---------------+---------+-----------+----------+-----------------+ FV DistalFull                                                           +---------+---------------+---------+-----------+----------+-----------------+ PFV      Full                                                           +---------+---------------+---------+-----------+----------+-----------------+ POP      Full           Yes      Yes                                    +---------+---------------+---------+-----------+----------+-----------------+ PTV      None           No       No                   Age Indeterminate +---------+---------------+---------+-----------+----------+-----------------+ PERO     Full                                                           +---------+---------------+---------+-----------+----------+-----------------+     Summary: RIGHT: - There is no evidence of deep vein thrombosis in the lower extremity.  - No cystic structure found in the popliteal fossa.  LEFT: - Findings consistent with age indeterminate deep vein thrombosis involving a single left posterior tibial vein. - No cystic structure found in the popliteal fossa.  *See table(s) above for measurements and observations. Electronically signed by Servando Snare MD on 07/09/2020 at 4:55:02 PM.    Final     Labs:  Basic Metabolic Panel: Recent Labs  Lab 07/30/20 0708 08/02/20 0744  NA 131* 134*  K 3.4* 4.6  CL 100 100  CO2 21* 22  GLUCOSE 130* 111*  BUN 21 9  CREATININE 1.31* 0.73  CALCIUM 8.8* 9.2    CBC: Recent Labs  Lab 08/02/20 0744  WBC 3.5*  NEUTROABS 1.2*  HGB 12.3  HCT 37.3   MCV 98.2  PLT 307    CBG: No results for input(s): GLUCAP in the last 168 hours.  Family history.  Maternal uncle with myocardial infarction.  Noted history of hypertension hyperlipidemia.  Denies any esophageal cancer rectal cancer or colon cancer  Brief HPI:   Amanda Collier is a 74 y.o. right-handed female with history of hypertension, diabetes mellitus, alcohol use as well as nondisplaced left fracture of left lateral malleolus and nondisplaced fracture of lateral malleolus right fibula sprain of tibiofibular ligament right ankle after recent fall 06/14/2020 followed by Dr. Louanne Skye.  Per chart review lives with spouse.  Two-level home bed and bath upstairs.  Reportedly independent prior to admission.  Presented 06/26/2020 with multiple falls and gait difficulties.  Recent MRI and imaging revealed large subacute subdural hematoma bilaterally with significant mass-effect on both cerebral hemispheres without midline shift.  Negative for acute infarct.  CT cervical thoracic spine with no fracture.  Patient underwent craniotomy hematoma evacuation of subdural hematoma 06/24/2020 per Dr. Christella Noa.  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  MRI of lumbar spine completed due to low back sacral pain lower extremity weakness 07/04/2020 showed subdural hemorrhage extending through the length of the visualized spine mainly dorsal and most notable in the lower thoracic segments and at the L5-S1 level.  The distal cord mildly deflected by hemorrhage but no cord signal abnormality identified.  Moderate flattening of the thecal sac at L5-S1 due to a disc bulge and subdural hemorrhage reviewed by neurology service advised conservative care consideration trial of Neurontin.  Maintained on Depakote as well as Keppra for seizure prophylaxis.  She was cleared to begin subcutaneous heparin for DVT prophylaxis as well as  resuming aspirin as prior to admission.  Bouts of urinary retention maintained on Urecholine.  Patient weightbearing as tolerated lower extremities Cam boot on the left and lace up ankle brace on the right.  Patient was admitted for a comprehensive rehab program   Hospital Course: HARA MILHOLLAND was admitted to rehab 07/08/2020 for inpatient therapies to consist of PT, ST and OT at least three hours five days a week. Past admission physiatrist, therapy team and rehab RN have worked together to provide customized collaborative inpatient rehab.  Pertaining to patient's large subacute traumatic subdural hematoma significant mass-effect she had undergone craniotomy hematoma evacuation 06/24/2020 with postoperative recurrent subdural hematoma on the right with craniotomy evacuation 06/26/2020.  She would follow-up with neurosurgery.  Subcutaneous heparin for DVT prophylaxis a vascular study 07/09/2020 showed age-indeterminate clot left posterior tibial vein noted history of DVT 10 years ago repeat ultrasound - 07/26/2020.  She did remain on low-dose aspirin therapy.  Pain managed with use of Topamax Depakote for both headaches and pain with good results.  K pad for low back sacral pain and oxycodone for breakthrough pain.  Mood stabilization with Ritalin 10 mg twice daily and Klonopin as needed anxiety much improved.  In regards of patient's subdural hemorrhage extending throughout the length of the visualized spine mainly dorsal and most notable in the lower thoracic segments L5-S1 unclear origin pain improved follow-up outpatient imaging.  She had been on Keppra for seizure prophylaxis this was discontinued at time  of discharge.  No seizure activity noted.  Blood pressure monitored with low-dose Cozaar.  Lipitor for hyperlipidemia.  She did have some urinary retention improved with the use of Urecholine as well as E. coli UTI completing antibiotic course.  In regards to patient's left nondisplaced fracture of the left  lateral malleolus nondisplaced fracture of lateral malleolus right fibula sprain after recent fall 06/14/2020 follow-up orthopedic services she is weightbearing as tolerated.  Orthotics have been discontinued.  Blood sugars overall controlled noted diabetes mellitus she continued on Glucophage and follow-up outpatient with PCP.   Blood pressures were monitored on TID basis and soft and controlled  Diabetes has been monitored with ac/hs CBG checks and SSI was use prn for tighter BS control.    Rehab course: During patient's stay in rehab weekly team conferences were held to monitor patient's progress, set goals and discuss barriers to discharge. At admission, patient required moderate assist sit to side-lying moderate assist sit to stand  Physical exam.  Blood pressure 114/73 pulse 66 temperature 98 respirations 16 oxygen saturation 91% room air Constitutional.  Awake alert somewhat tangential no acute distress HEENT Head.  Craniotomy site clean and dry Eyes.  Pupils round and reactive to light no discharge without nystagmus Neck.  Supple nontender no JVD without thyromegaly Cardiac regular rate rhythm without any extra sounds or murmur heard Abdomen.  Soft nontender positive bowel sounds without rebound Respiratory effort normal no respiratory distress without wheeze Musculoskeletal.  Comments.  Upper extremities 4+/5 deltoids biceps triceps wrist extension grip and finger abduction bilaterally decreased effort due to pain Lower extremities hip flexion 3 -/5 on right 3/5 on left knee extension 4+/5, knee flexion 4/5, dorsiflexion plantarflexion 3/5 on right 3 -/5 on left Skin.  No bogginess or wounds Neurological.  Alert follows commands provides name and age some decrease in overall awareness cooperative with exam.  Tangential cannot stay focused on certain topics.  Marlana Salvage  has had improvement in activity tolerance, balance, postural control as well as ability to compensate for deficits. Marlana Salvage  has had improvement in functional use RUE/LUE  and RLE/LLE as well as improvement in awareness.  Patient descended up and down stairs minimal assist.  Car simulator using rolling walker requiring verbal cues contact-guard assist.  Supine to sit modified independent.  Ambulates 140 feet with assistive device.  Contact-guard assist for transfers during ADLs.  Set up for grooming and upper body ADL tasks.  She continued demonstrate deficits in muscle weakness and impaired timing and sequencing.  It was discussed with family need for supervision for safety.  Family teaching completed and plan discharge to home       Disposition: Discharge to home    Diet: Regular consistency  Special Instructions: No driving smoking or alcohol   Medications at discharge 1.  Tylenol as needed 2.  Aspirin 81 mg p.o. twice daily 3.  Lipitor 10 mg p.o. daily 4.  Urecholine 10 mg p.o. 3 times daily with taper 5.  Klonopin 0.5 mg p.o. twice daily as needed anxiety 6.  Depakote 250 mg daily and 750 mg p.o. nightly 7.  Hydrocodone 2 tablets every 6 hours as needed pain 8.  Linzess 145 mcg p.o. daily 9.  Cozaar 25 mg p.o. daily 10.  Glucophage 500 mg p.o. daily 11.  Ritalin 10 mg p.o. twice daily 12.  Protonix 40 mg p.o. nightly 13.  Senokot 2 tabs p.o. nightly 14.  Topamax 75 mg p.o. daily 15.  Vitamin D 50,000 units every  Wednesday  30-35 minutes were spent completing discharge summary and discharge planning  Discharge Instructions    Ambulatory referral to Physical Medicine Rehab   Complete by: As directed    Moderate complexity follow-up 1 to 2 weeks traumatic SDH       Follow-up Information    Meredith Staggers, MD Follow up.   Specialty: Physical Medicine and Rehabilitation Why: Office to call for appointment Contact information: [address] [place] [address] [phone]        Ashok Pall, MD Follow up.   Specialty: Neurosurgery Why: Call for appointment Contact  information: [address] [place] [address] [phone]               Signed: Cathlyn Parsons 08/04/2020, 4:53 AM

## 2020-08-02 NOTE — Progress Notes (Signed)
Occupational Therapy Session Note  Patient Details  Name: Amanda Collier MRN: [id number] Date of Birth: 1946-09-13  Today's Date: 08/02/2020 OT Individual Time: 6728-9791 OT Individual Time Calculation (min): 55 min    Short Term Goals: Week 4:  OT Short Term Goal 1 (Week 4): STG=LTG d/t ELOS  Skilled Therapeutic Interventions/Progress Updates:    Pt received supine with no c/o pain, requesting to take shower. Pt completed bed mobiltiy with mod I. Sit > stand from EOB with supervision. Pt used RW to complete ambulatory transfer into the bathroom with supervision. She transferred to the toilet and completed toileting tasks with CGA. BM voided. Pt transferred into walk in shower with min cueing for safety. Pt completed UB bathing seated with set up assist. Close (S)- CGA provided for standing level peri care. Pt required min cueing for drying off completely. Pt returned to EOB with supervision. Min cueing for use of figure 4 method to thread BLE into pants. Overall pants donned with CGA. Pt with much improved activity tolerance and safety awareness. Pt returned to supine in bed and was left with all needs met, bed alarm set.   Therapy Documentation Precautions:  Precautions Precautions: Fall Precaution Comments: drains removed Required Braces or Orthoses: Other Brace Other Brace: no braces needed as of 07/27/2020 Restrictions Weight Bearing Restrictions: Yes RLE Weight Bearing: Weight bearing as tolerated LLE Weight Bearing: Weight bearing as tolerated Other Position/Activity Restrictions: WBAT without braces as of 07/27/2020  Therapy/Group: Individual Therapy  Curtis Sites 08/02/2020, 9:00 AM

## 2020-08-03 ENCOUNTER — Inpatient Hospital Stay (HOSPITAL_COMMUNITY): Payer: Medicare Other | Admitting: Physical Therapy

## 2020-08-03 ENCOUNTER — Inpatient Hospital Stay (HOSPITAL_COMMUNITY): Payer: Medicare Other | Admitting: Speech Pathology

## 2020-08-03 ENCOUNTER — Inpatient Hospital Stay (HOSPITAL_COMMUNITY): Payer: Medicare Other

## 2020-08-03 MED ORDER — METHYLPHENIDATE HCL 10 MG PO TABS: 10.0000 mg | ORAL_TABLET | Freq: Two times a day (BID) | ORAL | 0 refills | Status: DC

## 2020-08-03 MED ORDER — ATORVASTATIN CALCIUM 10 MG PO TABS: 10.0000 mg | ORAL_TABLET | Freq: Every day | ORAL | 0 refills | Status: DC

## 2020-08-03 MED ORDER — PANTOPRAZOLE SODIUM 40 MG PO TBEC: 40.0000 mg | DELAYED_RELEASE_TABLET | Freq: Every day | ORAL | 0 refills | Status: DC

## 2020-08-03 MED ORDER — LINACLOTIDE 145 MCG PO CAPS: 145.0000 ug | ORAL_CAPSULE | Freq: Every day | ORAL | 0 refills | Status: DC

## 2020-08-03 MED ORDER — DIVALPROEX SODIUM ER 250 MG PO TB24: 250.0000 mg | ORAL_TABLET | ORAL | 0 refills | Status: DC

## 2020-08-03 MED ORDER — HYDROCODONE-ACETAMINOPHEN 5-325 MG PO TABS: 2.0000 | ORAL_TABLET | Freq: Four times a day (QID) | ORAL | 0 refills | Status: DC | PRN

## 2020-08-03 MED ORDER — VITAMIN D (ERGOCALCIFEROL) 1.25 MG (50000 UNIT) PO CAPS: 50000.0000 [IU] | ORAL_CAPSULE | ORAL | 0 refills | Status: DC

## 2020-08-03 MED ORDER — METFORMIN HCL ER 500 MG PO TB24: 500.0000 mg | ORAL_TABLET | Freq: Every day | ORAL | 0 refills | Status: DC

## 2020-08-03 MED ORDER — LOSARTAN POTASSIUM 25 MG PO TABS: 25.0000 mg | ORAL_TABLET | Freq: Every day | ORAL | 0 refills | Status: DC

## 2020-08-03 MED ORDER — LEVETIRACETAM 250 MG PO TABS: 250.0000 mg | ORAL_TABLET | Freq: Two times a day (BID) | ORAL | 0 refills | Status: DC

## 2020-08-03 MED ORDER — CLONAZEPAM 0.5 MG PO TABS: 0.5000 mg | ORAL_TABLET | Freq: Two times a day (BID) | ORAL | 0 refills | Status: DC | PRN

## 2020-08-03 MED ORDER — TOPIRAMATE 50 MG PO TABS: 75.0000 mg | ORAL_TABLET | Freq: Every day | ORAL | 0 refills | Status: DC

## 2020-08-03 MED ORDER — ACETAMINOPHEN 325 MG PO TABS: 650.0000 mg | ORAL_TABLET | Freq: Four times a day (QID) | ORAL | Status: DC | PRN

## 2020-08-03 NOTE — Progress Notes (Signed)
Speech Language Pathology Discharge Summary  Patient Details  Name: Amanda Collier MRN: [id number] Date of Birth: May 02, 1946  Today's Date: 08/03/2020 SLP Individual Time: 0700-0800 SLP Individual Time Calculation (min): 60 min   Skilled Therapeutic Interventions:  Skilled treatment session focused on cognitive goals. SLP facilitated session by providing overall Min A verbal cues for problem solving during a complex, functional task. Function was impacted by verbosity and decreased attention to task. SLP reviewed memory compensatory strategies and strategies to utilize at home to maximize attention to tasks. She verbalized understanding. Patient left upright in bed with alarm on and all needs within reach. Continue with current plan of care.   Patient has met 4 of 4 long term goals.  Patient to discharge at overall Supervision level.   Reasons goals not met: N/A   Clinical Impression/Discharge Summary: Patient has made functional gains and has met 4 of 4 LTGs this admission. Currently, patient is demonstrating behaviors consistent with a [place] Level VIII and requires overall supervision verbal cues to complete functional and familiar tasks safely in regards to attention, problem solving and recall. Patient and family education is complete and patient will discharge home with assistance from family. Patient would benefit from f/u SLP services to maximize her cognitive functioning and overall functional independence in order to reduce caregiver burden.   Care Partner:  Caregiver Able to Provide Assistance: Yes  Type of Caregiver Assistance: Physical;Cognitive  Recommendation:  Home Health SLP;24 hour supervision/assistance  Rationale for SLP Follow Up: Reduce caregiver burden;Maximize cognitive function and independence   Equipment: N/A   Reasons for discharge: Discharged from hospital;Treatment goals met   Patient/Family Agrees with Progress Made and Goals Achieved: Yes    Martez Weiand,  [place] 08/03/2020, 7:11 AM

## 2020-08-03 NOTE — Progress Notes (Signed)
Patient ID: Amanda Collier, female   DOB: 01/31/1946, 74 y.o.   MRN: [id number]  SW received updates from Francis that transport chair can be delivered to the home or can be picked up. SW to discuss with pt.  SW met with pt in room to provide updates from team conference, and d/c remains tomorrow. SW informed about transport chair. Pt would like for item to be shipped to home. SW confirms 3in1 BSC delivered to room. SW informed pt on [facility] [place]. Pt aware SW to follow-up with her husband.   SW spoke with pt husband Merry Proud to inform on above. No questions/concerns reported.   Loralee Pacas, MSW, [place] Office: [phone] Cell: [phone] Fax: [phone]

## 2020-08-03 NOTE — Progress Notes (Addendum)
[place] PHYSICAL MEDICINE & REHABILITATION PROGRESS NOTE   Subjective/Complaints: Up with OT about to walk to bathroom. No new complaints. Doesn't want to take linzess anymore. Otherwise excited about discharge  ROS: Patient denies fever, rash, sore throat, blurred vision, nausea, vomiting, diarrhea, cough, shortness of breath or chest pain,   headache, or mood change.   .  Objective:   No results found. Recent Labs    08/02/20 0744  WBC 3.5*  HGB 12.3  HCT 37.3  PLT 307   Recent Labs    08/02/20 0744  NA 134*  K 4.6  CL 100  CO2 22  GLUCOSE 111*  BUN 9  CREATININE 0.73  CALCIUM 9.2    Intake/Output Summary (Last 24 hours) at 08/03/2020 1143 Last data filed at 08/03/2020 0830 Gross per 24 hour  Intake 680 ml  Output --  Net 680 ml     Physical Exam: Vital Signs Blood pressure 108/72, pulse 80, temperature 98.1 F (36.7 C), resp. rate 17, height 5\' 4"  (1.626 m), weight 74.8 kg, last menstrual period 11/14/2003, SpO2 100 %.   Constitutional: No distress . Vital signs reviewed. HEENT: EOMI, oral membranes moist Neck: supple Cardiovascular: RRR without murmur. No JVD    Respiratory/Chest: CTA Bilaterally without wheezes or rales. Normal effort    GI/Abdomen: BS +, non-tender, non-distended Ext: no clubbing, cyanosis, or edema Psych: pleasant and cooperative Neuro: Alert. Improved attention and memory. More focused and redirectable. Still can be tangential. Motor: Bilateral upper extremities: 4+/5 proximal distal Bilateral lower extremities: Hip flexion, knee extension 4/5, ankle dorsiflexion 4+/5--walks with right leg externally rotated to aid in clearance during swing   Assessment/Plan: 1. Functional deficits secondary to traumatic subdural hematoma which require 3+ hours per day of interdisciplinary therapy in a comprehensive inpatient rehab setting.  Physiatrist is providing close team supervision and 24 hour management of active medical problems listed  below.  Physiatrist and rehab team continue to assess barriers to discharge/monitor patient progress toward functional and medical goals  Care Tool:  Bathing  Bathing activity did not occur: Safety/medical concerns (Per MD orders ) Body parts bathed by patient: Right arm, Left arm, Chest, Abdomen, Face, Front perineal area, Buttocks, Left upper leg, Right upper leg, Right lower leg, Left lower leg   Body parts bathed by helper: Right lower leg, Left lower leg     Bathing assist Assist Level: Supervision/Verbal cueing     Upper Body Dressing/Undressing Upper body dressing   What is the patient wearing?: Pull over shirt, Bra    Upper body assist Assist Level: Supervision/Verbal cueing    Lower Body Dressing/Undressing Lower body dressing      What is the patient wearing?: Pants, Underwear/pull up     Lower body assist Assist for lower body dressing: Supervision/Verbal cueing     Toileting Toileting    Toileting assist Assist for toileting: Supervision/Verbal cueing     Transfers Chair/bed transfer  Transfers assist     Chair/bed transfer assist level: Supervision/Verbal cueing Chair/bed transfer assistive device: Armrests, Walker   Locomotion Ambulation   Ambulation assist   Ambulation activity did not occur: Safety/medical concerns (symptomatic hypotension)  Assist level: Contact Guard/Touching assist Assistive device: Walker-rolling Max distance: 100'   Walk 10 feet activity   Assist  Walk 10 feet activity did not occur: Safety/medical concerns  Assist level: Contact Guard/Touching assist Assistive device: Walker-rolling   Walk 50 feet activity   Assist Walk 50 feet with 2 turns activity did not occur:  Safety/medical concerns  Assist level: Contact Guard/Touching assist Assistive device: Walker-rolling    Walk 150 feet activity   Assist Walk 150 feet activity did not occur: Safety/medical concerns  Assist level: Contact Guard/Touching  assist Assistive device: Walker-rolling    Walk 10 feet on uneven surface  activity   Assist Walk 10 feet on uneven surfaces activity did not occur: Safety/medical concerns   Assist level: Contact Guard/Touching assist Assistive device: Aeronautical engineer Will patient use wheelchair at discharge?: No   Wheelchair activity did not occur: Safety/medical concerns         Wheelchair 50 feet with 2 turns activity    Assist    Wheelchair 50 feet with 2 turns activity did not occur: Safety/medical concerns       Wheelchair 150 feet activity     Assist  Wheelchair 150 feet activity did not occur: Safety/medical concerns       Blood pressure 108/72, pulse 80, temperature 98.1 F (36.7 C), resp. rate 17, height 5\' 4"  (1.626 m), weight 74.8 kg, last menstrual period 11/14/2003, SpO2 100 %.  Medical Problem List and Plan: 1.  Decreased functional ability with gait abnormality secondary to large subacute traumatic  subdural hematoma bilaterally with significant mass-effect.  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             -Continue CIR, making nice gains in mobility   -ELOS 9/22  -Patient to see me in the office for transitional care encounter in 1-2 weeks.  2.  Antithrombotics: -DVT/anticoagulation: Subcutaneous heparin.  vascular study 8/27 shows age indeterminate clot left posterior tib vein. 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    -repeat u/s NEGATIVE   9/13             -antiplatelet therapy: Aspirin 81 mg twice daily 3. Pain Management/headaches:   Topamax 75 mg nightly, Depakote 250 mg daily and 750 mg nightly.   Kpad for low back/sacral pain  -hydrocodone prn  -improved! 4. Mood: Klonopin 0.5 mg nightly as needed.   -remains motivated. Anxiety better             -antipsychotic agents:  n/a 5. Neuropsych: This  patient is capable of making decisions on her own behalf.  -continue ritalin 10mg  bid for concentration 6. Skin/Wound Care: Routine skin checks 7. Fluids/Electrolytes/Nutrition:   -mild hypokalemia 9/17--->4.6 9/20, dc'ed supp 8.  Subdural hemorrhage extending throughout the length of the visualized spine mainly dorsal and most notable in the lower thoracic segments and at the L5-S1 level.  Unclear origin  - pain improved.  -f/u imaging as outpt 9.  Seizure prophylaxis.  Keppra decreased to 250 daily on 9/11--will stop. On VPA already for pain 10.  Hypertension.  Monitor with increased mobility.  Decreased Cozaar to 50mg .   Controlled on 9/21 11.  Hyperlipidemia.  Lipitor 12.  Neurogenic bladder.  Urecholine 10 mg 3 times daily.    8/30 100k E coli in urine S to ctx/cephalexin   -9/1: keflex completed  Improvement with urecholine 25mg  tid   -OOB to void   -9/20 dc'ed urecholine 13.  History of alcohol use. 1-2 drinks/day per pt-  Provide counseling. 14.  Left nondisplaced fracture of left lateral malleolus and nondisplaced fracture of lateral malleolus right fibula sprain of tibiofibular ligament/right ankle after recent fall 06/14/2020.     - WBAT bilateral lower  extremities.    -xrays reviewed, discussed with ortho. Orthotics off  -no pain in either leg 15. Neurogenic bowel/constipation:  -improved,off opiates essentially  -dc linzess  -senna-s at bedtime 16.  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  Mild acute blood loss anemia  Hemoglobin 11.7 on 9/6--->12.3 9/20    18. DM: well controlled with glucophage   CBG prn  LOS: 26 days A FACE TO FACE EVALUATION WAS PERFORMED  Meredith Staggers 08/03/2020, 11:43 AM

## 2020-08-03 NOTE — Progress Notes (Signed)
Physical Therapy Discharge Summary  Patient Details  Name: Amanda Collier MRN: [id number] Date of Birth: [birth year]  Today's Date: 08/03/2020 PT Individual Time: 1500-1600 PT Individual Time Calculation (min): 60 min    Patient has met 9 of 9 long term goals due to improved activity tolerance, improved balance, improved postural control, increased strength, ability to compensate for deficits, improved attention, improved awareness and improved coordination.  Patient to discharge at an ambulatory level Supervision.   Patient's care partner is independent to provide the necessary physical assistance at discharge.  Reasons goals not met: Pt has met all rehab goals.  Recommendation:  Patient will benefit from ongoing skilled PT services in home health setting to continue to advance safe functional mobility, address ongoing impairments in endurance, strength, balance, safety, independence with functional mobility, and minimize fall risk.  Equipment: No equipment provided  Reasons for discharge: treatment goals met and discharge from hospital  Patient/family agrees with progress made and goals achieved: Yes   Skilled Intervention: Pt received seated in bed, agreeable to PT session. No complaints of pain. Pt independent with bed mobility. Pt at Supervision level for all transfers with RW throughout session. Ambulation up to 200 ft with RW at Supervision level. Pt able to navigate 12 x 6" stairs with one handrail laterally with CGA. Car transfer with Supervision with cues for safe transfer technique from sedan height to simulate home environment. Ambulation up/down ramp with RW and Supervision, across uneven ground with RW and CGA for balance. Reviewed use of a reacher to retrieve objects from the ground, pt able to perform safely with Supervision. Pt returned to bed independently at end of session, needs in reach, bed alarm in place.  PT  Discharge Precautions/Restrictions Precautions Precautions: Fall Other Brace: no braces needed as of 07/27/2020 Restrictions Weight Bearing Restrictions: No RLE Weight Bearing: Weight bearing as tolerated LLE Weight Bearing: Weight bearing as tolerated Other Position/Activity Restrictions: WBAT without braces as of 07/27/2020 Vital Signs   Pain   Vision/Perception  Perception Perception: Within Functional Limits Praxis Praxis: Intact  Cognition Overall Cognitive Status: Impaired/Different from baseline Arousal/Alertness: Awake/alert Orientation Level: Oriented X4 Attention: Selective Sustained Attention: Appears intact Selective Attention: Impaired Memory: Impaired Memory Impairment: Retrieval deficit;Decreased short term memory;Decreased recall of new information Awareness: Appears intact Problem Solving: Impaired Safety/Judgment: Appears intact Comments: Pt with much improved cognition, still some higher level deficits in memory, processing, and problem solving Sensation Sensation Light Touch: Appears Intact Proprioception: Appears Intact Coordination Gross Motor Movements are Fluid and Coordinated: Yes Fine Motor Movements are Fluid and Coordinated: Yes Motor  Motor Motor: Other (comment) Motor - Discharge Observations: generalized weakness  Mobility Bed Mobility Bed Mobility: Rolling Right;Rolling Left;Sit to Supine;Supine to Sit Rolling Right: Independent Rolling Left: Independent Supine to Sit: Independent Sit to Supine: Independent Transfers Transfers: Sit to Stand;Stand to Sit;Stand Pivot Transfers Sit to Stand: Supervision/Verbal cueing Stand to Sit: Supervision/Verbal cueing Stand Pivot Transfers: Supervision/Verbal cueing Squat Pivot Transfers: Supervision/Verbal cueing Transfer (Assistive device): Rolling walker Locomotion  Gait Ambulation: Yes Gait Assistance: Supervision/Verbal cueing Gait Distance (Feet): 200 Feet Assistive device: Rolling  walker Gait Gait: Yes Gait Pattern: Impaired Gait Pattern: Decreased step length - right;Decreased step length - left;Shuffle Gait velocity: decreased Stairs / Additional Locomotion Stairs: Yes Stairs Assistance: Contact Guard/Touching assist Stair Management Technique: One rail Right;One rail Left;Step to pattern;Sideways Number of Stairs: 12 Height of Stairs: 6 Ramp: Supervision/Verbal cueing Wheelchair Mobility Wheelchair Mobility: No  Trunk/Postural Assessment  Cervical Assessment Cervical Assessment: Within Functional  Limits Thoracic Assessment Thoracic Assessment: Within Functional Limits Lumbar Assessment Lumbar Assessment: Exceptions to [facility] (posterior pelvic tilt) Postural Control Postural Control: Deficits on evaluation (delayed)  Balance Balance Balance Assessed: Yes Static Sitting Balance Static Sitting - Balance Support: No upper extremity supported;Feet supported Static Sitting - Level of Assistance: 6: Modified independent (Device/Increase time) Dynamic Sitting Balance Dynamic Sitting - Balance Support: No upper extremity supported;Feet supported;During functional activity Dynamic Sitting - Level of Assistance: 6: Modified independent (Device/Increase time) Static Standing Balance Static Standing - Balance Support: Bilateral upper extremity supported;During functional activity Static Standing - Level of Assistance: 5: Stand by assistance Dynamic Standing Balance Dynamic Standing - Balance Support: Bilateral upper extremity supported;During functional activity Dynamic Standing - Level of Assistance: 5: Stand by assistance Extremity Assessment      RLE Assessment RLE Assessment: Within Functional Limits General Strength Comments: 4/5 grossly LLE Assessment LLE Assessment: Within Functional Limits General Strength Comments: 4/5 grossly    Excell Seltzer 08/03/2020, 5:32 PM

## 2020-08-03 NOTE — Patient Care Conference (Signed)
Inpatient RehabilitationTeam Conference and Plan of Care Update Date: 08/03/2020   Time: 10:19 AM    Patient Name: Amanda Collier      Medical Record Number: [id number]  Date of Birth: 27-Feb-1946 Sex: Female         Room/Bed: 4W20C/4W20C-01 Payor Info: Payor: MEDICARE / Plan: MEDICARE PART A AND B / Product Type: *No Product type* /    Admit Date/Time:  07/08/2020  4:58 PM  Primary Diagnosis:  Traumatic subdural hematoma [facility])  Hospital Problems: Principal Problem:   Traumatic subdural hematoma ([place]) Active Problems:   Acute blood loss anemia   Hyponatremia   Neurogenic bowel   Neurogenic bladder   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 Alger Simons Care Coodinator Present: Loralee Pacas, LCSWA;Yanai Hobson Creig Hines, RN, BSN, CRRN Nurse Present: Isla Pence, RN PT Present: Excell Seltzer, PT OT Present: Laverle Hobby, OT SLP Present: Weston Anna, SLP PPS Coordinator present : Gunnar Fusi, SLP     Current Status/Progress Goal Weekly Team Focus  Bowel/Bladder   Continent x2, last BM 9/20  Remain continent x2  Assess every shift and as needed   Swallow/Nutrition/ Hydration             ADL's   Supervision- CGA with all ADLs. At goal level. Much improved in safety awareness and processing  Supervision overall, dynamic standing downgraded to CGA to reflect inconsistent performance at times  Family education/training, d/c planning, ADLs and transfers   Mobility   Supervision overall with RW, CGA stairs  Supervision-CGA overall, gait 150 ft using LRAD, stairs min A  d/c planning, family education, endurance   Communication             Safety/Cognition/ Behavioral Observations  Supervision  Supervision  Education complete, d/c tomorrow   Pain   No pain reported during shift  pain <3/10   Assess every shift and as needed   Skin   Abdominal bruising, otherwise intact  Prevent breakdown  Assess every shift and as needed     Discharge Planning:  D/c to home with her husband who will provide 24/7 care. Fam edu completed 9/15-9/17.   Team Discussion: PVR's 0, No complaint's of pain. OT/PT at goal level. SLP at goal level and chatty. Patient on target to meet rehab goals: yes  *See Care Plan and progress notes for long and short-term goals.   Revisions to Treatment Plan:  MD to [place].  Teaching Needs: Continue with family education.  Current Barriers to Discharge: None noted, Patient is ready for discharge.  Possible Resolutions to Barriers: N/A     Medical Summary Current Status: improved bowel movements, emptying bladder more completely. pain improved. sodium normalizing  Barriers to Discharge: Medical stability   Possible Resolutions to Barriers/Weekly Focus: finalizing meds for discharge. adjusting bowel regimen, maximize bladder emptying   Continued Need for Acute Rehabilitation Level of Care: The patient requires daily medical management by a physician with specialized training in physical medicine and rehabilitation for the following reasons: Direction of a multidisciplinary physical rehabilitation program to maximize functional independence : Yes Medical management of patient stability for increased activity during participation in an intensive rehabilitation regime.: Yes Analysis of laboratory values and/or radiology reports with any subsequent need for medication adjustment and/or medical intervention. : Yes   I attest that I was present, lead  the team conference, and concur with the assessment and plan of the team.   Cristi Loron 08/03/2020, 2:05 PM

## 2020-08-03 NOTE — Discharge Instructions (Signed)
Inpatient Rehab Discharge Instructions  Amanda Collier Discharge date and time: No discharge date for patient encounter.   Activities/Precautions/ Functional Status: Activity: activity as tolerated Diet: regular diet Wound Care: none needed Functional status:  ___ No restrictions     ___ Walk up steps independently ___ 24/7 supervision/assistance   ___ Walk up steps with assistance ___ Intermittent supervision/assistance  ___ Bathe/dress independently ___ Walk with walker     _x__ Bathe/dress with assistance ___ Walk Independently    ___ Shower independently ___ Walk with assistance    ___ Shower with assistance ___ No alcohol     ___ Return to work/school ________  COMMUNITY REFERRALS UPON DISCHARGE:    Home Health:   PT     OT     ST                   Agency: [facility]: [phone] *Please expect follow-up within 2-3 days of discharge to schedule your home visit. If you do not receive follow-up, be sure to contact the branch directly.*   Medical Equipment/Items Ordered: 3in1 bedside commode, and transport chair (to be delivered)                                                 Agency/Supplier: [facility] [phone]    Special Instructions: No driving smoking or alcohol   My questions have been answered and I understand these instructions. I will adhere to these goals and the provided educational materials after my discharge from the hospital.  Patient/Caregiver Signature _______________________________ Date __________  Clinician Signature _______________________________________ Date __________  Please bring this form and your medication list with you to all your follow-up doctor's appointments.

## 2020-08-04 NOTE — Progress Notes (Signed)
[place] PHYSICAL MEDICINE & REHABILITATION PROGRESS NOTE   Subjective/Complaints: No complaints this morning.  She is ready for discharge.  She asks about a good PCP. Her husband will be here soon to go over medications.   ROS: Patient denies fever, rash, sore throat, blurred vision, nausea, vomiting, diarrhea, cough, shortness of breath or chest pain,   headache, or mood change.   .  Objective:   No results found. Recent Labs    08/02/20 0744  WBC 3.5*  HGB 12.3  HCT 37.3  PLT 307   Recent Labs    08/02/20 0744  NA 134*  K 4.6  CL 100  CO2 22  GLUCOSE 111*  BUN 9  CREATININE 0.73  CALCIUM 9.2    Intake/Output Summary (Last 24 hours) at 08/04/2020 0900 Last data filed at 08/03/2020 1840 Gross per 24 hour  Intake 260 ml  Output --  Net 260 ml     Physical Exam: Vital Signs Blood pressure 114/72, pulse 64, temperature 97.8 F (36.6 C), temperature source Oral, resp. rate 18, height 5\' 4"  (1.626 m), weight 74.8 kg, last menstrual period 11/14/2003, SpO2 100 %.  General: Alert and oriented x 3, No apparent distress HEENT: Head is normocephalic, atraumatic, PERRLA, EOMI, sclera anicteric, oral mucosa pink and moist, dentition intact, ext ear canals clear,  Neck: Supple without JVD or lymphadenopathy Heart: Reg rate and rhythm. No murmurs rubs or gallops Chest: CTA bilaterally without wheezes, rales, or rhonchi; no distress Abdomen: Soft, non-tender, non-distended, bowel sounds positive. Extremities: No clubbing, cyanosis, or edema. Pulses are 2+ Skin: Clean and intact without signs of breakdown Neuro: Alert. Improved attention and memory. More focused and redirectable. Still can be tangential. Motor: Bilateral upper extremities: 4+/5 proximal distal Bilateral lower extremities: Hip flexion, knee extension 4/5, ankle dorsiflexion 4+/5--walks with right leg externally rotated to aid in clearance during swing   Assessment/Plan: 1. Functional deficits secondary  to traumatic subdural hematoma which require 3+ hours per day of interdisciplinary therapy in a comprehensive inpatient rehab setting.  Physiatrist is providing close team supervision and 24 hour management of active medical problems listed below.  Physiatrist and rehab team continue to assess barriers to discharge/monitor patient progress toward functional and medical goals  Care Tool:  Bathing  Bathing activity did not occur: Safety/medical concerns (Per MD orders ) Body parts bathed by patient: Right arm, Left arm, Chest, Abdomen, Face, Front perineal area, Buttocks, Left upper leg, Right upper leg, Right lower leg, Left lower leg   Body parts bathed by helper: Right lower leg, Left lower leg     Bathing assist Assist Level: Supervision/Verbal cueing     Upper Body Dressing/Undressing Upper body dressing   What is the patient wearing?: Pull over shirt, Bra    Upper body assist Assist Level: Supervision/Verbal cueing    Lower Body Dressing/Undressing Lower body dressing      What is the patient wearing?: Pants, Underwear/pull up     Lower body assist Assist for lower body dressing: Supervision/Verbal cueing     Toileting Toileting    Toileting assist Assist for toileting: Supervision/Verbal cueing     Transfers Chair/bed transfer  Transfers assist     Chair/bed transfer assist level: Supervision/Verbal cueing Chair/bed transfer assistive device: Programmer, multimedia   Ambulation assist   Ambulation activity did not occur: Safety/medical concerns (symptomatic hypotension)  Assist level: Supervision/Verbal cueing Assistive device: Walker-rolling Max distance: 200'   Walk 10 feet activity   Assist  Walk 10 feet activity did not occur: Safety/medical concerns  Assist level: Supervision/Verbal cueing Assistive device: Walker-rolling   Walk 50 feet activity   Assist Walk 50 feet with 2 turns activity did not occur: Safety/medical  concerns  Assist level: Supervision/Verbal cueing Assistive device: Walker-rolling    Walk 150 feet activity   Assist Walk 150 feet activity did not occur: Safety/medical concerns  Assist level: Supervision/Verbal cueing Assistive device: Walker-rolling    Walk 10 feet on uneven surface  activity   Assist Walk 10 feet on uneven surfaces activity did not occur: Safety/medical concerns   Assist level: Contact Guard/Touching assist Assistive device: Aeronautical engineer Will patient use wheelchair at discharge?: No   Wheelchair activity did not occur: Safety/medical concerns         Wheelchair 50 feet with 2 turns activity    Assist    Wheelchair 50 feet with 2 turns activity did not occur: Safety/medical concerns       Wheelchair 150 feet activity     Assist  Wheelchair 150 feet activity did not occur: Safety/medical concerns       Blood pressure 114/72, pulse 64, temperature 97.8 F (36.6 C), temperature source Oral, resp. rate 18, height 5\' 4"  (1.626 m), weight 74.8 kg, last menstrual period 11/14/2003, SpO2 100 %.  Medical Problem List and Plan: 1.  Decreased functional ability with gait abnormality secondary to large subacute traumatic  subdural hematoma bilaterally with significant mass-effect.  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             -Continue CIR, making nice gains in mobility   -ELOS 9/22  -Patient to see me in the office for transitional care encounter in 1-2 weeks.  2.  Antithrombotics: -DVT/anticoagulation: Subcutaneous heparin.  vascular study 8/27 shows age indeterminate clot left posterior tib vein. 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   -repeat u/s NEGATIVE   9/13             -antiplatelet therapy: Aspirin 81 mg twice daily 3. Pain Management/headaches:   Topamax 75 mg nightly,  Depakote 250 mg daily and 750 mg nightly.   Kpad for low back/sacral pain  -hydrocodone prn  -much improved 4. Mood: Klonopin 0.5 mg nightly as needed.   -remains motivated. Anxiety is well controlled.              -antipsychotic agents:  n/a 5. Neuropsych: This patient is capable of making decisions on her own behalf.  -continue ritalin 10mg  bid for concentration 6. Skin/Wound Care: Routine skin checks 7. Fluids/Electrolytes/Nutrition:   -mild hypokalemia 9/17--->4.6 9/20, dc'ed supp 8.  Subdural hemorrhage extending throughout the length of the visualized spine mainly dorsal and most notable in the lower thoracic segments and at the L5-S1 level.  Unclear origin  - pain improved.  -f/u imaging as outpt 9.  Seizure prophylaxis.  Keppra decreased to 250 daily on 9/11--will stop. On VPA already for pain 10.  Hypertension.  Monitor with increased mobility.  Decreased Cozaar to 50mg .   Controlled on 9/21 11.  Hyperlipidemia.  Lipitor 12.  Neurogenic bladder.  Urecholine 10 mg 3 times daily.    8/30 100k E coli in urine S to ctx/cephalexin   -9/1: keflex completed  Improvement with urecholine 25mg  tid   -OOB to void   -9/20 dc'ed urecholine 13.  History of alcohol use. 1-2  drinks/day per pt-  Provide counseling. 14.  Left nondisplaced fracture of left lateral malleolus and nondisplaced fracture of lateral malleolus right fibula sprain of tibiofibular ligament/right ankle after recent fall 06/14/2020.     - WBAT bilateral lower extremities.    -xrays reviewed, discussed with ortho. Orthotics off  -no pain in either leg 15. Neurogenic bowel/constipation:  -improved,off opiates essentially  -dc linzess  -senna-s at bedtime 16.  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  Mild acute blood loss anemia  Hemoglobin 11.7 on 9/6--->12.3 9/20 18. DM: well controlled with glucophage  >30 minutes spent in discussion of importance of finding a PCP,  discussing medications, physical examination.    CBG prn  LOS: 27 days A FACE TO FACE EVALUATION WAS PERFORMED  Amanda Collier P Hever Castilleja 08/04/2020, 9:00 AM

## 2020-08-04 NOTE — Progress Notes (Signed)
Pt left unit per wheelchair with all belongings. Pt left stable accompanied by husband.

## 2020-08-04 NOTE — Progress Notes (Signed)
Inpatient Rehabilitation Care Coordinator  Discharge Note  The overall goal for the admission was met for:   Discharge location: Yes. D/c to home with 24/7 care from husband.   Length of Stay: Yes. 26 days.   Discharge activity level: Yes. Supervision.  Home/community participation: Yes. Limited.   Services provided included: MD, RD, PT, OT, SLP, RN, CM, TR, Pharmacy, Neuropsych and SW  Financial Services: Medicare  Follow-up services arranged: [facility]: [facility] for HHPT/OT/SLP and DME: [employer] for 3in[facility] and transport chair (this item to be delivered to home)  Comments (or additional information): contact pt husband Merry Proud [phone]  Patient/Family verbalized understanding of follow-up arrangements: Yes  Individual responsible for coordination of the follow-up plan: Pt to have assistance coordinating care needs.   Confirmed correct DME delivered: Rana Snare 08/04/2020    Rana Snare

## 2020-08-04 NOTE — Progress Notes (Signed)
Patient resting in no acute distress, anticipates discharge home today

## 2020-08-04 NOTE — Plan of Care (Signed)
  Problem: Consults Goal: RH BRAIN INJURY PATIENT EDUCATION Description: Description: See Patient Education module for eduction specifics Outcome: Adequate for Discharge Goal: Skin Care Protocol Initiated - if Braden Score 18 or less Description: If consults are not indicated, leave blank or document N/A Outcome: Adequate for Discharge Goal: Nutrition Consult-if indicated Outcome: Adequate for Discharge   Problem: RH BOWEL ELIMINATION Goal: RH STG MANAGE BOWEL WITH ASSISTANCE Description: STG Manage Bowel with mod I Assistance. Outcome: Adequate for Discharge Goal: RH STG MANAGE BOWEL W/MEDICATION W/ASSISTANCE Description: STG Manage Bowel with Medication with mod I Assistance. Outcome: Adequate for Discharge   Problem: RH BLADDER ELIMINATION Goal: RH STG MANAGE BLADDER WITH ASSISTANCE Description: STG Manage Bladder With mod I Assistance Outcome: Adequate for Discharge Goal: RH STG MANAGE BLADDER WITH MEDICATION WITH ASSISTANCE Description: STG Manage Bladder With Medication With mod I Assistance. Outcome: Adequate for Discharge   Problem: RH SKIN INTEGRITY Goal: RH STG SKIN FREE OF INFECTION/BREAKDOWN Description: Pt will be free of skin breakdown/infection prior to DC with min assist.  Outcome: Adequate for Discharge Goal: RH STG ABLE TO PERFORM INCISION/WOUND CARE W/ASSISTANCE Description: STG Able To Perform Incision/Wound Care With min Assistance. Outcome: Adequate for Discharge   Problem: RH SAFETY Goal: RH STG ADHERE TO SAFETY PRECAUTIONS W/ASSISTANCE/DEVICE Description: STG Adhere to Safety Precautions With cues/reminders with AD Outcome: Adequate for Discharge   Problem: RH COGNITION-NURSING Goal: RH STG USES MEMORY AIDS/STRATEGIES W/ASSIST TO PROBLEM SOLVE Description: STG Uses Memory Aids/Strategies With cues/reminders Assistance to Problem Solve. Outcome: Adequate for Discharge   Problem: RH PAIN MANAGEMENT Goal: RH STG PAIN MANAGED AT OR BELOW PT'S PAIN  GOAL Description: Less than 3 on 0-10 scale Outcome: Adequate for Discharge   Problem: RH KNOWLEDGE DEFICIT BRAIN INJURY Goal: RH STG INCREASE KNOWLEDGE OF SELF CARE AFTER BRAIN INJURY Description: Pt will be able to demonstrate understanding of BI teaching, safety precautions, falls, and medication management with supervision assist using handouts/booklets prior to DC.  Outcome: Adequate for Discharge   Problem: Consults Goal: RH BRAIN INJURY PATIENT EDUCATION Description: Description: See Patient Education module for eduction specifics Outcome: Adequate for Discharge

## 2020-08-05 ENCOUNTER — Ambulatory Visit: Payer: Medicare Other

## 2020-08-06 ENCOUNTER — Telehealth: Payer: Self-pay | Admitting: *Deleted

## 2020-08-06 NOTE — Telephone Encounter (Signed)
Transitional Care call completed.  Appointment confirmed  Address confirmed, Not enough time for new patient packet.   1. Are you/is patient experiencing any problems since coming home? Dizziness, weakness Are there any questions regarding any aspect of care? no  2. Are there any questions regarding medications administration/dosing? No  Are meds being taken as prescribed? yes Patient should review meds with caller to confirm   3. Have there been any falls? No  4. Has Home Health been to the house and/or have they contacted you? Not yet, expecting phone call soon If not, have you tried to contact them? Can we help you contact them?   5. Are bowels and bladder emptying properly? yes Are there any unexpected incontinence issues? No If applicable, is patient following bowel/bladder programs?   6. Any fevers, problems with breathing, unexpected pain? no  7. Are there any skin problems or new areas of breakdown? no  8. Has the patient/family member arranged specialty MD follow up (ie cardiology/neurology/renal/surgical/etc)? yes Can we help arrange?   9. Does the patient need any other services or support that we can help arrange? no  10. Are caregivers following through as expected in assisting the patient? yes  11. Has the patient quit smoking, drinking alcohol, or using drugs as recommended? Patient does not some, currently not drinking, does not do illicit drugs

## 2020-08-09 ENCOUNTER — Telehealth: Payer: Self-pay | Admitting: *Deleted

## 2020-08-09 NOTE — Telephone Encounter (Signed)
Mr Pennisi called and left a message he would like to review Mrs Mallon's medications.  I tried calling the number he left but got VM. Asked him to call our office back.

## 2020-08-10 ENCOUNTER — Other Ambulatory Visit: Payer: Self-pay

## 2020-08-10 NOTE — Telephone Encounter (Signed)
Called her and left VM telling her to stop keppra.

## 2020-08-10 NOTE — Telephone Encounter (Signed)
Patient was given Levetiracetam by the pharmacy. However it was not on her discharge order. Should she take the medication?  Call back ph# [phone]

## 2020-08-11 ENCOUNTER — Encounter: Payer: Self-pay | Admitting: Physical Medicine & Rehabilitation

## 2020-08-11 ENCOUNTER — Other Ambulatory Visit: Payer: Self-pay

## 2020-08-11 ENCOUNTER — Encounter: Payer: Medicare Other | Attending: Physical Medicine & Rehabilitation | Admitting: Physical Medicine & Rehabilitation

## 2020-08-11 VITALS — BP 105/73 | HR 100 | Ht 64.0 in | Wt 141.0 lb

## 2020-08-11 DIAGNOSIS — H8110 Benign paroxysmal vertigo, unspecified ear: Secondary | ICD-10-CM | POA: Diagnosis not present

## 2020-08-11 DIAGNOSIS — S065X3S Traumatic subdural hemorrhage with loss of consciousness of 1 hour to 5 hours 59 minutes, sequela: Secondary | ICD-10-CM | POA: Diagnosis not present

## 2020-08-11 DIAGNOSIS — E871 Hypo-osmolality and hyponatremia: Secondary | ICD-10-CM | POA: Diagnosis not present

## 2020-08-11 DIAGNOSIS — N319 Neuromuscular dysfunction of bladder, unspecified: Secondary | ICD-10-CM

## 2020-08-11 NOTE — Patient Instructions (Addendum)
YOU MAY DECREASE RITALIN TO 10MG  DAILY   MIRALAX 17G DAILY SENNA-S TWO TABLETS AT BEDTIME YOU MAY DOUBLE BOTH OF THESE UP AND USE TWICE DAILY  DULCOLAX SUPPOSITORY IF YOU TOLERATE SORBITOL  60 CC FOR RESCUE  PLENTY OF LIQUIDS PLENTY OF FOOD PLENTY OF ACTIVITY

## 2020-08-11 NOTE — Progress Notes (Signed)
Subjective:    Patient ID: Amanda Collier, female    DOB: May 31, 1946, 74 y.o.   MRN: [id number]  HPI   Amanda Collier is here for a transitional care visit in follow up of her TBI and inpatient rehab admit.  She has been home with her husband for a few days now.  They are waiting to hear from home health therapies.  She has 24-hour supervision at home.  When her husband leaves the house he is hired an Engineer, production to be with her.  She is doing some short distance ambulation at home.  She experiences some dizziness when she rolls or moves in bed. When she sits up tends to resolve over a few minutes and the same with standing.   Back pain has generally resolved.  She has chronic headaches which are managed with Topamax and Depakote.  She is no longer using hydrocodone for her back.  She is sleeping well for the most part. She tends to go to bed near midnight with an afternoon nap. She will wake up around 4-6 am to use the bathroom.  Her sleep may total 6 to 7 hours but is not continuous given the above.  She is constipated again.  She is not eating as much as she should either.  Her weight was 142 pounds today.  She is hesitant to use MiraLAX daily and often is just using prune juice or Colace.  Bladder seems to be emptying well without any issues.  She is no longer on Urecholine.  Attention still is not back at baseline.  She remains on methylphenidate 10 mg twice daily as prescribed.  She has had some friends who are interested in coming over to the house, providing meals, and socializing but she is a bit hesitant given her injury and does feel overwhelmed by the thought of it.  Mood is generally positive.  She does have some anxiety at times but it seems to be generally under control.  Family and her husband are very supportive and positive.  Pain Inventory Average Pain 0 Pain Right Now 0 My pain is none  LOCATION OF PAIN   low back  BOWEL Number of stools per week: normal Oral laxative  use No  Type of laxative no Enema or suppository use No  History of colostomy No  Incontinent No   BLADDER Pads In and out cath, frequency n/a Able to self cath n/a Bladder incontinence No  Frequent urination No  Leakage with coughing No  Difficulty starting stream No  Incomplete bladder emptying No    Mobility walk with assistance use a walker ability to climb steps?  no use a wheelchair Do you have any goals in this area?  yes  Function retired  Neuro/Psych dizziness  Prior Studies transitional  Physicians involved in your care transitional   Family History  Adopted: Yes  Problem Relation Age of Onset  . Heart attack Maternal Uncle        ? unknown if correct/patient adopted   Social History   Socioeconomic History  . Marital status: Married    Spouse name: Amanda Collier  . Number of children: 2  . Years of education: Teacher, English as a foreign language, almost masters  . Highest education level: Not on file  Occupational History  . Not on file  Tobacco Use  . Smoking status: Never Smoker  . Smokeless tobacco: Never Used  Vaping Use  . Vaping Use: Never used  Substance and Sexual Activity  . Alcohol  use: Yes    Alcohol/week: 7.0 standard drinks    Types: 7 Standard drinks or equivalent per week  . Drug use: No  . Sexual activity: Yes    Partners: Male    Birth control/protection: Other-see comments, Post-menopausal    Comment: vasectomy  Other Topics Concern  . Not on file  Social History Narrative   Lives with husband   Caffeine use: Drinks mocha every couple days   Tea- daily   Social Determinants of Health   Financial Resource Strain:   . Difficulty of Paying Living Expenses: Not on file  Food Insecurity:   . Worried About Charity fundraiser in the Last Year: Not on file  . Ran Out of Food in the Last Year: Not on file  Transportation Needs:   . Lack of Transportation (Medical): Not on file  . Lack of Transportation (Non-Medical): Not on file    Physical Activity:   . Days of Exercise per Week: Not on file  . Minutes of Exercise per Session: Not on file  Stress:   . Feeling of Stress : Not on file  Social Connections:   . Frequency of Communication with Friends and Family: Not on file  . Frequency of Social Gatherings with Friends and Family: Not on file  . Attends Religious Services: Not on file  . Active Member of Clubs or Organizations: Not on file  . Attends Archivist Meetings: Not on file  . Marital Status: Not on file   Past Surgical History:  Procedure Laterality Date  . CRANIOTOMY Bilateral 06/24/2020   Procedure: Bilateral craniotomy for evacuation of subdural hematoma;  Surgeon: Ashok Pall, MD;  Location: [facility];  Service: Neurosurgery;  Laterality: Bilateral;  . CRANIOTOMY Right 06/26/2020   Procedure: CRANIOTOMY HEMATOMA EVACUATION SUBDURAL;  Surgeon: Ashok Pall, MD;  Location: [facility];  Service: Neurosurgery;  Laterality: Right;  . NO PAST SURGERIES     Past Medical History:  Diagnosis Date  . Headache syndrome 12/18/2016  . High blood pressure   . Hypertension    followed by Dr Gillian Shields medications  . Leg swelling    Hx of   BP 105/73   Pulse 100   Ht '5\' 4"'  (1.626 m)   LMP 11/14/2003   SpO2 98%   BMI 28.32 kg/m   Opioid Risk Score:   Fall Risk Score:  `1  Depression screen PHQ 2/9  No flowsheet data found.  Review of Systems  Constitutional: Negative.   HENT: Negative.   Eyes: Negative.   Respiratory: Negative.   Cardiovascular: Negative.   Gastrointestinal: Negative.   Endocrine: Negative.   Genitourinary: Negative.   Musculoskeletal: Negative.   Skin: Negative.   Allergic/Immunologic: Negative.   Neurological: Positive for dizziness.  Hematological: Negative.   Psychiatric/Behavioral: Negative.   All other systems reviewed and are negative.      Objective:   Physical Exam Gen: no distress, normal appearing HEENT: oral mucosa pink and moist,  NCAT Cardio: Reg rate Chest: normal effort, normal rate of breathing Abd: soft, non-distended Ext: no edema Skin: intact Neuro: Patient is alert and oriented x3.  She still becomes distracted at times.  She has some mild short-term memory deficits.  Sometimes needs to be redirected but stays on task much more effectively than she had been 2 or 3 weeks ago.  I saw no frank nystagmus while sitting and moving the head in 3 dimensions.  Could not perform Hallpike Dix maneuver due to her  being in the wheelchair.  Visual fields are grossly intact.  Hearing is seems functional.  Remainder of cranial nerves appear within normal range.  Strength is 5 out of 5 in all 4 extremities with intact sensation.  Fine motor coordination appears to be generally functional.  No resting tone. Musculoskeletal: Both ankles and feet appear normal without any swelling.  Normal range of motion in all limbs noted today.  She did not ambulate for me today. Psych: pleasant, normal affect.  Tangential at times but improved from hospital stay       Assessment & Plan:  1.  Functional deficits secondary to large subacute traumatic subdural hematomas status post craniotomy on 06/24/2020 with recurrent hematoma and right craniotomy on 06/26/2020.  I believe some of her dizziness may be vestibular based on her description  -Patient awaiting home health therapy  -Given delay in that her needs may be met better from an outpatient standpoint, I made a referral to [facility] health PT, OT, SLP neuro rehab.  Last physical therapy to perform vestibular assessment and treat 2. .  Pain management/low back pain--essentially resolved low back pain  -Tylenol and heat  -Topamax 75 mg nightly and Depakote 250/750 daily for headaches 3. Mood/behavior  -Ritalin--decrease to 33m daily and observe for concentration and focus.  If she struggles, advised her husband to increase back to 10 mg twice daily 4.  Dorsal subdural hematoma extending through the  length of the visualized spine to the L5-S1 level  -Follow-up imaging at the discretion of neurosurgery 5.  History of EtOH use--no drinking 6.  Recent nondisplaced left lateral malleolus and right lateral malleolus fractures  -Resolved with normal weightbearing 7.  Hyponatremia, resolved 8.  Neurogenic bladder--resolved 9.  Constipation  -Increase bowel regimen to include MiraLAX 17 g daily with Senokot S2 tablets at bedtime.  She can double up on these if still constipated  -May use Dulcolax suppository to stimulate from below  -May use sorbitol for rescue  -Encourage plenty of liquids food and activity  Thirty minutes of face to face patient care time were spent during this visit. All questions were encouraged and answered. Follow up with me in 6 weeks.

## 2020-08-13 ENCOUNTER — Ambulatory Visit: Payer: Medicare Other | Admitting: Physical Therapy

## 2020-08-13 ENCOUNTER — Other Ambulatory Visit: Payer: Self-pay | Admitting: Neurology

## 2020-08-16 DIAGNOSIS — Z9181 History of falling: Secondary | ICD-10-CM | POA: Diagnosis not present

## 2020-08-16 DIAGNOSIS — E119 Type 2 diabetes mellitus without complications: Secondary | ICD-10-CM | POA: Diagnosis not present

## 2020-08-16 DIAGNOSIS — E785 Hyperlipidemia, unspecified: Secondary | ICD-10-CM | POA: Diagnosis not present

## 2020-08-16 DIAGNOSIS — F32A Depression, unspecified: Secondary | ICD-10-CM | POA: Diagnosis not present

## 2020-08-16 DIAGNOSIS — I10 Essential (primary) hypertension: Secondary | ICD-10-CM | POA: Diagnosis not present

## 2020-08-16 DIAGNOSIS — Z7982 Long term (current) use of aspirin: Secondary | ICD-10-CM | POA: Diagnosis not present

## 2020-08-16 DIAGNOSIS — G4700 Insomnia, unspecified: Secondary | ICD-10-CM | POA: Diagnosis not present

## 2020-08-16 DIAGNOSIS — K592 Neurogenic bowel, not elsewhere classified: Secondary | ICD-10-CM | POA: Diagnosis not present

## 2020-08-16 DIAGNOSIS — Z7984 Long term (current) use of oral hypoglycemic drugs: Secondary | ICD-10-CM | POA: Diagnosis not present

## 2020-08-16 DIAGNOSIS — S93431D Sprain of tibiofibular ligament of right ankle, subsequent encounter: Secondary | ICD-10-CM | POA: Diagnosis not present

## 2020-08-16 DIAGNOSIS — S065X0D Traumatic subdural hemorrhage without loss of consciousness, subsequent encounter: Secondary | ICD-10-CM | POA: Diagnosis not present

## 2020-08-16 DIAGNOSIS — F419 Anxiety disorder, unspecified: Secondary | ICD-10-CM | POA: Diagnosis not present

## 2020-08-16 DIAGNOSIS — S065X9A Traumatic subdural hemorrhage with loss of consciousness of unspecified duration, initial encounter: Secondary | ICD-10-CM | POA: Diagnosis not present

## 2020-08-16 DIAGNOSIS — S8265XD Nondisplaced fracture of lateral malleolus of left fibula, subsequent encounter for closed fracture with routine healing: Secondary | ICD-10-CM | POA: Diagnosis not present

## 2020-08-16 DIAGNOSIS — K5900 Constipation, unspecified: Secondary | ICD-10-CM | POA: Diagnosis not present

## 2020-08-16 DIAGNOSIS — M791 Myalgia, unspecified site: Secondary | ICD-10-CM | POA: Diagnosis not present

## 2020-08-16 DIAGNOSIS — S8264XD Nondisplaced fracture of lateral malleolus of right fibula, subsequent encounter for closed fracture with routine healing: Secondary | ICD-10-CM | POA: Diagnosis not present

## 2020-08-16 DIAGNOSIS — N319 Neuromuscular dysfunction of bladder, unspecified: Secondary | ICD-10-CM | POA: Diagnosis not present

## 2020-08-16 DIAGNOSIS — M5137 Other intervertebral disc degeneration, lumbosacral region: Secondary | ICD-10-CM | POA: Diagnosis not present

## 2020-08-17 ENCOUNTER — Other Ambulatory Visit: Payer: Self-pay | Admitting: Neurosurgery

## 2020-08-17 DIAGNOSIS — S065X9A Traumatic subdural hemorrhage with loss of consciousness of unspecified duration, initial encounter: Secondary | ICD-10-CM

## 2020-08-17 DIAGNOSIS — S065XAA Traumatic subdural hemorrhage with loss of consciousness status unknown, initial encounter: Secondary | ICD-10-CM

## 2020-08-18 ENCOUNTER — Telehealth: Payer: Self-pay

## 2020-08-18 NOTE — Telephone Encounter (Signed)
Flor, PT[facility] called for verbal orders for HHPT 2wk3, 1wk3. Orders approved and given per discharge summary.

## 2020-08-19 DIAGNOSIS — S065X0D Traumatic subdural hemorrhage without loss of consciousness, subsequent encounter: Secondary | ICD-10-CM | POA: Diagnosis not present

## 2020-08-19 DIAGNOSIS — S8264XD Nondisplaced fracture of lateral malleolus of right fibula, subsequent encounter for closed fracture with routine healing: Secondary | ICD-10-CM | POA: Diagnosis not present

## 2020-08-19 DIAGNOSIS — S8265XD Nondisplaced fracture of lateral malleolus of left fibula, subsequent encounter for closed fracture with routine healing: Secondary | ICD-10-CM | POA: Diagnosis not present

## 2020-08-19 DIAGNOSIS — I10 Essential (primary) hypertension: Secondary | ICD-10-CM | POA: Diagnosis not present

## 2020-08-19 DIAGNOSIS — S93431D Sprain of tibiofibular ligament of right ankle, subsequent encounter: Secondary | ICD-10-CM | POA: Diagnosis not present

## 2020-08-19 DIAGNOSIS — E119 Type 2 diabetes mellitus without complications: Secondary | ICD-10-CM | POA: Diagnosis not present

## 2020-08-20 ENCOUNTER — Telehealth: Payer: Self-pay

## 2020-08-20 ENCOUNTER — Ambulatory Visit
Admission: RE | Admit: 2020-08-20 | Discharge: 2020-08-20 | Disposition: A | Discharge status: Home / Self Care | Payer: Medicare Other | Source: Ambulatory Visit | Attending: Neurosurgery | Admitting: Neurosurgery

## 2020-08-20 DIAGNOSIS — S93431D Sprain of tibiofibular ligament of right ankle, subsequent encounter: Secondary | ICD-10-CM | POA: Diagnosis not present

## 2020-08-20 DIAGNOSIS — S065XAA Traumatic subdural hemorrhage with loss of consciousness status unknown, initial encounter: Secondary | ICD-10-CM

## 2020-08-20 DIAGNOSIS — I6200 Nontraumatic subdural hemorrhage, unspecified: Secondary | ICD-10-CM | POA: Diagnosis not present

## 2020-08-20 DIAGNOSIS — Z87828 Personal history of other (healed) physical injury and trauma: Secondary | ICD-10-CM | POA: Diagnosis not present

## 2020-08-20 DIAGNOSIS — S8264XD Nondisplaced fracture of lateral malleolus of right fibula, subsequent encounter for closed fracture with routine healing: Secondary | ICD-10-CM | POA: Diagnosis not present

## 2020-08-20 DIAGNOSIS — I10 Essential (primary) hypertension: Secondary | ICD-10-CM | POA: Diagnosis not present

## 2020-08-20 DIAGNOSIS — S065X0D Traumatic subdural hemorrhage without loss of consciousness, subsequent encounter: Secondary | ICD-10-CM | POA: Diagnosis not present

## 2020-08-20 DIAGNOSIS — E119 Type 2 diabetes mellitus without complications: Secondary | ICD-10-CM | POA: Diagnosis not present

## 2020-08-20 DIAGNOSIS — S065X9A Traumatic subdural hemorrhage with loss of consciousness of unspecified duration, initial encounter: Secondary | ICD-10-CM

## 2020-08-20 DIAGNOSIS — I672 Cerebral atherosclerosis: Secondary | ICD-10-CM | POA: Diagnosis not present

## 2020-08-20 DIAGNOSIS — S8265XD Nondisplaced fracture of lateral malleolus of left fibula, subsequent encounter for closed fracture with routine healing: Secondary | ICD-10-CM | POA: Diagnosis not present

## 2020-08-20 NOTE — Telephone Encounter (Signed)
Verbal okay: Orders given for speech therapy once a week for 6 weeks. To address cognition.  Okay per hospital discharge note.

## 2020-08-25 ENCOUNTER — Ambulatory Visit: Payer: Medicare Other | Admitting: Physical Therapy

## 2020-08-25 DIAGNOSIS — I10 Essential (primary) hypertension: Secondary | ICD-10-CM | POA: Diagnosis not present

## 2020-08-25 DIAGNOSIS — S8265XD Nondisplaced fracture of lateral malleolus of left fibula, subsequent encounter for closed fracture with routine healing: Secondary | ICD-10-CM | POA: Diagnosis not present

## 2020-08-25 DIAGNOSIS — S065X0D Traumatic subdural hemorrhage without loss of consciousness, subsequent encounter: Secondary | ICD-10-CM | POA: Diagnosis not present

## 2020-08-25 DIAGNOSIS — S93431D Sprain of tibiofibular ligament of right ankle, subsequent encounter: Secondary | ICD-10-CM | POA: Diagnosis not present

## 2020-08-25 DIAGNOSIS — E119 Type 2 diabetes mellitus without complications: Secondary | ICD-10-CM | POA: Diagnosis not present

## 2020-08-25 DIAGNOSIS — S8264XD Nondisplaced fracture of lateral malleolus of right fibula, subsequent encounter for closed fracture with routine healing: Secondary | ICD-10-CM | POA: Diagnosis not present

## 2020-08-26 ENCOUNTER — Encounter: Payer: Medicare Other | Admitting: Physical Therapy

## 2020-08-26 DIAGNOSIS — S8265XD Nondisplaced fracture of lateral malleolus of left fibula, subsequent encounter for closed fracture with routine healing: Secondary | ICD-10-CM | POA: Diagnosis not present

## 2020-08-26 DIAGNOSIS — E119 Type 2 diabetes mellitus without complications: Secondary | ICD-10-CM | POA: Diagnosis not present

## 2020-08-26 DIAGNOSIS — S8264XD Nondisplaced fracture of lateral malleolus of right fibula, subsequent encounter for closed fracture with routine healing: Secondary | ICD-10-CM | POA: Diagnosis not present

## 2020-08-26 DIAGNOSIS — S065X0D Traumatic subdural hemorrhage without loss of consciousness, subsequent encounter: Secondary | ICD-10-CM | POA: Diagnosis not present

## 2020-08-26 DIAGNOSIS — I10 Essential (primary) hypertension: Secondary | ICD-10-CM | POA: Diagnosis not present

## 2020-08-26 DIAGNOSIS — S93431D Sprain of tibiofibular ligament of right ankle, subsequent encounter: Secondary | ICD-10-CM | POA: Diagnosis not present

## 2020-08-27 DIAGNOSIS — S93431D Sprain of tibiofibular ligament of right ankle, subsequent encounter: Secondary | ICD-10-CM | POA: Diagnosis not present

## 2020-08-27 DIAGNOSIS — E119 Type 2 diabetes mellitus without complications: Secondary | ICD-10-CM | POA: Diagnosis not present

## 2020-08-27 DIAGNOSIS — S8265XD Nondisplaced fracture of lateral malleolus of left fibula, subsequent encounter for closed fracture with routine healing: Secondary | ICD-10-CM | POA: Diagnosis not present

## 2020-08-27 DIAGNOSIS — I10 Essential (primary) hypertension: Secondary | ICD-10-CM | POA: Diagnosis not present

## 2020-08-27 DIAGNOSIS — S065X0D Traumatic subdural hemorrhage without loss of consciousness, subsequent encounter: Secondary | ICD-10-CM | POA: Diagnosis not present

## 2020-08-27 DIAGNOSIS — S8264XD Nondisplaced fracture of lateral malleolus of right fibula, subsequent encounter for closed fracture with routine healing: Secondary | ICD-10-CM | POA: Diagnosis not present

## 2020-08-30 DIAGNOSIS — S065X0D Traumatic subdural hemorrhage without loss of consciousness, subsequent encounter: Secondary | ICD-10-CM | POA: Diagnosis not present

## 2020-08-30 DIAGNOSIS — S8265XD Nondisplaced fracture of lateral malleolus of left fibula, subsequent encounter for closed fracture with routine healing: Secondary | ICD-10-CM | POA: Diagnosis not present

## 2020-08-30 DIAGNOSIS — I10 Essential (primary) hypertension: Secondary | ICD-10-CM | POA: Diagnosis not present

## 2020-08-30 DIAGNOSIS — S8264XD Nondisplaced fracture of lateral malleolus of right fibula, subsequent encounter for closed fracture with routine healing: Secondary | ICD-10-CM | POA: Diagnosis not present

## 2020-08-30 DIAGNOSIS — E119 Type 2 diabetes mellitus without complications: Secondary | ICD-10-CM | POA: Diagnosis not present

## 2020-08-30 DIAGNOSIS — S93431D Sprain of tibiofibular ligament of right ankle, subsequent encounter: Secondary | ICD-10-CM | POA: Diagnosis not present

## 2020-09-02 DIAGNOSIS — E119 Type 2 diabetes mellitus without complications: Secondary | ICD-10-CM | POA: Diagnosis not present

## 2020-09-02 DIAGNOSIS — S065X0D Traumatic subdural hemorrhage without loss of consciousness, subsequent encounter: Secondary | ICD-10-CM | POA: Diagnosis not present

## 2020-09-02 DIAGNOSIS — S93431D Sprain of tibiofibular ligament of right ankle, subsequent encounter: Secondary | ICD-10-CM | POA: Diagnosis not present

## 2020-09-02 DIAGNOSIS — I10 Essential (primary) hypertension: Secondary | ICD-10-CM | POA: Diagnosis not present

## 2020-09-02 DIAGNOSIS — S8264XD Nondisplaced fracture of lateral malleolus of right fibula, subsequent encounter for closed fracture with routine healing: Secondary | ICD-10-CM | POA: Diagnosis not present

## 2020-09-02 DIAGNOSIS — S8265XD Nondisplaced fracture of lateral malleolus of left fibula, subsequent encounter for closed fracture with routine healing: Secondary | ICD-10-CM | POA: Diagnosis not present

## 2020-09-03 DIAGNOSIS — I10 Essential (primary) hypertension: Secondary | ICD-10-CM | POA: Diagnosis not present

## 2020-09-03 DIAGNOSIS — E119 Type 2 diabetes mellitus without complications: Secondary | ICD-10-CM | POA: Diagnosis not present

## 2020-09-03 DIAGNOSIS — S065X0D Traumatic subdural hemorrhage without loss of consciousness, subsequent encounter: Secondary | ICD-10-CM | POA: Diagnosis not present

## 2020-09-03 DIAGNOSIS — S93431D Sprain of tibiofibular ligament of right ankle, subsequent encounter: Secondary | ICD-10-CM | POA: Diagnosis not present

## 2020-09-03 DIAGNOSIS — S8265XD Nondisplaced fracture of lateral malleolus of left fibula, subsequent encounter for closed fracture with routine healing: Secondary | ICD-10-CM | POA: Diagnosis not present

## 2020-09-03 DIAGNOSIS — S8264XD Nondisplaced fracture of lateral malleolus of right fibula, subsequent encounter for closed fracture with routine healing: Secondary | ICD-10-CM | POA: Diagnosis not present

## 2020-09-06 DIAGNOSIS — I10 Essential (primary) hypertension: Secondary | ICD-10-CM | POA: Diagnosis not present

## 2020-09-06 DIAGNOSIS — S8265XD Nondisplaced fracture of lateral malleolus of left fibula, subsequent encounter for closed fracture with routine healing: Secondary | ICD-10-CM | POA: Diagnosis not present

## 2020-09-06 DIAGNOSIS — S8264XD Nondisplaced fracture of lateral malleolus of right fibula, subsequent encounter for closed fracture with routine healing: Secondary | ICD-10-CM | POA: Diagnosis not present

## 2020-09-06 DIAGNOSIS — S93431D Sprain of tibiofibular ligament of right ankle, subsequent encounter: Secondary | ICD-10-CM | POA: Diagnosis not present

## 2020-09-06 DIAGNOSIS — S065X0D Traumatic subdural hemorrhage without loss of consciousness, subsequent encounter: Secondary | ICD-10-CM | POA: Diagnosis not present

## 2020-09-06 DIAGNOSIS — E119 Type 2 diabetes mellitus without complications: Secondary | ICD-10-CM | POA: Diagnosis not present

## 2020-09-08 ENCOUNTER — Other Ambulatory Visit: Payer: Self-pay | Admitting: Neurology

## 2020-09-09 DIAGNOSIS — E119 Type 2 diabetes mellitus without complications: Secondary | ICD-10-CM | POA: Diagnosis not present

## 2020-09-09 DIAGNOSIS — S065X0D Traumatic subdural hemorrhage without loss of consciousness, subsequent encounter: Secondary | ICD-10-CM | POA: Diagnosis not present

## 2020-09-09 DIAGNOSIS — I10 Essential (primary) hypertension: Secondary | ICD-10-CM | POA: Diagnosis not present

## 2020-09-09 DIAGNOSIS — S8264XD Nondisplaced fracture of lateral malleolus of right fibula, subsequent encounter for closed fracture with routine healing: Secondary | ICD-10-CM | POA: Diagnosis not present

## 2020-09-09 DIAGNOSIS — S8265XD Nondisplaced fracture of lateral malleolus of left fibula, subsequent encounter for closed fracture with routine healing: Secondary | ICD-10-CM | POA: Diagnosis not present

## 2020-09-09 DIAGNOSIS — S93431D Sprain of tibiofibular ligament of right ankle, subsequent encounter: Secondary | ICD-10-CM | POA: Diagnosis not present

## 2020-09-15 DIAGNOSIS — Z7984 Long term (current) use of oral hypoglycemic drugs: Secondary | ICD-10-CM | POA: Diagnosis not present

## 2020-09-15 DIAGNOSIS — Z7982 Long term (current) use of aspirin: Secondary | ICD-10-CM | POA: Diagnosis not present

## 2020-09-15 DIAGNOSIS — S065X9A Traumatic subdural hemorrhage with loss of consciousness of unspecified duration, initial encounter: Secondary | ICD-10-CM | POA: Diagnosis not present

## 2020-09-15 DIAGNOSIS — G4700 Insomnia, unspecified: Secondary | ICD-10-CM | POA: Diagnosis not present

## 2020-09-15 DIAGNOSIS — F32A Depression, unspecified: Secondary | ICD-10-CM | POA: Diagnosis not present

## 2020-09-15 DIAGNOSIS — S93431D Sprain of tibiofibular ligament of right ankle, subsequent encounter: Secondary | ICD-10-CM | POA: Diagnosis not present

## 2020-09-15 DIAGNOSIS — M791 Myalgia, unspecified site: Secondary | ICD-10-CM | POA: Diagnosis not present

## 2020-09-15 DIAGNOSIS — M5137 Other intervertebral disc degeneration, lumbosacral region: Secondary | ICD-10-CM | POA: Diagnosis not present

## 2020-09-15 DIAGNOSIS — F419 Anxiety disorder, unspecified: Secondary | ICD-10-CM | POA: Diagnosis not present

## 2020-09-15 DIAGNOSIS — N319 Neuromuscular dysfunction of bladder, unspecified: Secondary | ICD-10-CM | POA: Diagnosis not present

## 2020-09-15 DIAGNOSIS — S8265XD Nondisplaced fracture of lateral malleolus of left fibula, subsequent encounter for closed fracture with routine healing: Secondary | ICD-10-CM | POA: Diagnosis not present

## 2020-09-15 DIAGNOSIS — S8264XD Nondisplaced fracture of lateral malleolus of right fibula, subsequent encounter for closed fracture with routine healing: Secondary | ICD-10-CM | POA: Diagnosis not present

## 2020-09-15 DIAGNOSIS — Z9181 History of falling: Secondary | ICD-10-CM | POA: Diagnosis not present

## 2020-09-15 DIAGNOSIS — E785 Hyperlipidemia, unspecified: Secondary | ICD-10-CM | POA: Diagnosis not present

## 2020-09-15 DIAGNOSIS — I10 Essential (primary) hypertension: Secondary | ICD-10-CM | POA: Diagnosis not present

## 2020-09-15 DIAGNOSIS — K592 Neurogenic bowel, not elsewhere classified: Secondary | ICD-10-CM | POA: Diagnosis not present

## 2020-09-15 DIAGNOSIS — K5900 Constipation, unspecified: Secondary | ICD-10-CM | POA: Diagnosis not present

## 2020-09-15 DIAGNOSIS — E119 Type 2 diabetes mellitus without complications: Secondary | ICD-10-CM | POA: Diagnosis not present

## 2020-09-15 DIAGNOSIS — S065X0D Traumatic subdural hemorrhage without loss of consciousness, subsequent encounter: Secondary | ICD-10-CM | POA: Diagnosis not present

## 2020-09-16 DIAGNOSIS — S8265XD Nondisplaced fracture of lateral malleolus of left fibula, subsequent encounter for closed fracture with routine healing: Secondary | ICD-10-CM | POA: Diagnosis not present

## 2020-09-16 DIAGNOSIS — E119 Type 2 diabetes mellitus without complications: Secondary | ICD-10-CM | POA: Diagnosis not present

## 2020-09-16 DIAGNOSIS — S8264XD Nondisplaced fracture of lateral malleolus of right fibula, subsequent encounter for closed fracture with routine healing: Secondary | ICD-10-CM | POA: Diagnosis not present

## 2020-09-16 DIAGNOSIS — S93431D Sprain of tibiofibular ligament of right ankle, subsequent encounter: Secondary | ICD-10-CM | POA: Diagnosis not present

## 2020-09-16 DIAGNOSIS — S065X0D Traumatic subdural hemorrhage without loss of consciousness, subsequent encounter: Secondary | ICD-10-CM | POA: Diagnosis not present

## 2020-09-16 DIAGNOSIS — I10 Essential (primary) hypertension: Secondary | ICD-10-CM | POA: Diagnosis not present

## 2020-09-20 ENCOUNTER — Ambulatory Visit: Payer: Medicare Other | Admitting: Obstetrics & Gynecology

## 2020-09-22 ENCOUNTER — Telehealth: Payer: Self-pay | Admitting: *Deleted

## 2020-09-22 DIAGNOSIS — S93431D Sprain of tibiofibular ligament of right ankle, subsequent encounter: Secondary | ICD-10-CM | POA: Diagnosis not present

## 2020-09-22 DIAGNOSIS — E119 Type 2 diabetes mellitus without complications: Secondary | ICD-10-CM | POA: Diagnosis not present

## 2020-09-22 DIAGNOSIS — S065X0D Traumatic subdural hemorrhage without loss of consciousness, subsequent encounter: Secondary | ICD-10-CM | POA: Diagnosis not present

## 2020-09-22 DIAGNOSIS — I10 Essential (primary) hypertension: Secondary | ICD-10-CM | POA: Diagnosis not present

## 2020-09-22 DIAGNOSIS — S8264XD Nondisplaced fracture of lateral malleolus of right fibula, subsequent encounter for closed fracture with routine healing: Secondary | ICD-10-CM | POA: Diagnosis not present

## 2020-09-22 DIAGNOSIS — S8265XD Nondisplaced fracture of lateral malleolus of left fibula, subsequent encounter for closed fracture with routine healing: Secondary | ICD-10-CM | POA: Diagnosis not present

## 2020-09-22 NOTE — Telephone Encounter (Signed)
Patient left a message stating that she was dc;d from hospital on 09/22. Was sent home with losartan 25 mg daily by dan anguilli. She switched back to her general practitioner and picked up refills of losartan and did not realize they were 100 mg.  She wants to know what to do. Should she cut in half and take 50 mg, or stop all together.  Asking for Dr. Charm Barges advice.  Will be in to see Dr. Naaman Plummer next week but wants to know what to do inbetween

## 2020-09-22 NOTE — Telephone Encounter (Signed)
She can break them in half for now.

## 2020-09-23 DIAGNOSIS — Z23 Encounter for immunization: Secondary | ICD-10-CM | POA: Diagnosis not present

## 2020-09-23 MED ORDER — LOSARTAN POTASSIUM 25 MG PO TABS
25.0000 mg | ORAL_TABLET | Freq: Every day | ORAL | 0 refills | Status: DC
Start: 2020-09-23 — End: 2021-12-13

## 2020-09-23 NOTE — Telephone Encounter (Signed)
I spoke with Amanda Collier about her losartan.  It looks like the latest pressures have been in the low 315'X systolic (458/59 292/44) so she asked me to send refill of the 25 mg in until she sees her PCP.  She needs a refill on her methylphenidate. She said she has been taking daily not 2x daily.

## 2020-09-29 ENCOUNTER — Encounter: Payer: Medicare Other | Attending: Physical Medicine & Rehabilitation | Admitting: Physical Medicine & Rehabilitation

## 2020-09-29 ENCOUNTER — Encounter: Payer: Self-pay | Admitting: Physical Medicine & Rehabilitation

## 2020-09-29 ENCOUNTER — Other Ambulatory Visit: Payer: Self-pay

## 2020-09-29 DIAGNOSIS — H905 Unspecified sensorineural hearing loss: Secondary | ICD-10-CM | POA: Insufficient documentation

## 2020-09-29 DIAGNOSIS — H9041 Sensorineural hearing loss, unilateral, right ear, with unrestricted hearing on the contralateral side: Secondary | ICD-10-CM | POA: Insufficient documentation

## 2020-09-29 NOTE — Patient Instructions (Signed)
PLEASE FEEL FREE TO CALL OUR OFFICE WITH ANY PROBLEMS OR QUESTIONS ([phone])      

## 2020-09-29 NOTE — Progress Notes (Signed)
Subjective:    Patient ID: Amanda Collier, female    DOB: 06-Feb-1946, 74 y.o.   MRN: [id number]  HPI   Mrs Amanda Collier is here in follow up of her TBI. She didn't end up going to outpt as HH came to her house and started. She "graduated" from home therapy about a week ago. She's walking without a walker. No falls. She's gone out in the community for dinner or to shop with husband. She hasn't driven.   She ran out of ritalin about a month ago and has done fairly well off the medicine.   Her bp has been low and we ended up renewing her losartan at the 25mg  dose (she had been on the 150mg  dose prior to being hospitalizing)  BLood sugars have been under good control per pt  RIght sided hearing has not improved. She has a few minutes of dizziness/vertigo when she first wakes up in the morning. She typically just takes her time and it improves on its own. Therapy did not provide her any vestibular exercises.   She has not been drinking any etoh but asked if she could have half a glass of wine on occasion.   Pain Inventory Average Pain 0 Pain Right Now 0 My pain is no pain  In the last 24 hours, has pain interfered with the following? General activity 0 Relation with others 0 Enjoyment of life 1 What TIME of day is your pain at its worst? varies Sleep (in general) Good  Pain is worse with: standing Pain improves with: rest, heat/ice, therapy/exercise and pacing activities Relief from Meds: 10  Family History  Adopted: Yes  Problem Relation Age of Onset  . Heart attack Maternal Uncle        ? unknown if correct/patient adopted   Social History   Socioeconomic History  . Marital status: Married    Spouse name: Amanda Collier  . Number of children: 2  . Years of education: Teacher, English as a foreign language, almost masters  . Highest education level: Not on file  Occupational History  . Not on file  Tobacco Use  . Smoking status: Never Smoker  . Smokeless tobacco: Never Used  Vaping Use  .  Vaping Use: Never used  Substance and Sexual Activity  . Alcohol use: Yes    Alcohol/week: 7.0 standard drinks    Types: 7 Standard drinks or equivalent per week  . Drug use: No  . Sexual activity: Yes    Partners: Male    Birth control/protection: Other-see comments, Post-menopausal    Comment: vasectomy  Other Topics Concern  . Not on file  Social History Narrative   Lives with husband   Caffeine use: Drinks mocha every couple days   Tea- daily   Social Determinants of Health   Financial Resource Strain:   . Difficulty of Paying Living Expenses: Not on file  Food Insecurity:   . Worried About Charity fundraiser in the Last Year: Not on file  . Ran Out of Food in the Last Year: Not on file  Transportation Needs:   . Lack of Transportation (Medical): Not on file  . Lack of Transportation (Non-Medical): Not on file  Physical Activity:   . Days of Exercise per Week: Not on file  . Minutes of Exercise per Session: Not on file  Stress:   . Feeling of Stress : Not on file  Social Connections:   . Frequency of Communication with Friends and Family: Not on file  .  Frequency of Social Gatherings with Friends and Family: Not on file  . Attends Religious Services: Not on file  . Active Member of Clubs or Organizations: Not on file  . Attends Archivist Meetings: Not on file  . Marital Status: Not on file   Past Surgical History:  Procedure Laterality Date  . CRANIOTOMY Bilateral 06/24/2020   Procedure: Bilateral craniotomy for evacuation of subdural hematoma;  Surgeon: Amanda Pall, MD;  Location: [facility];  Service: Neurosurgery;  Laterality: Bilateral;  . CRANIOTOMY Right 06/26/2020   Procedure: CRANIOTOMY HEMATOMA EVACUATION SUBDURAL;  Surgeon: Amanda Pall, MD;  Location: [facility];  Service: Neurosurgery;  Laterality: Right;  . NO PAST SURGERIES     Past Surgical History:  Procedure Laterality Date  . CRANIOTOMY Bilateral 06/24/2020   Procedure: Bilateral craniotomy  for evacuation of subdural hematoma;  Surgeon: Amanda Pall, MD;  Location: [facility];  Service: Neurosurgery;  Laterality: Bilateral;  . CRANIOTOMY Right 06/26/2020   Procedure: CRANIOTOMY HEMATOMA EVACUATION SUBDURAL;  Surgeon: Amanda Pall, MD;  Location: [facility];  Service: Neurosurgery;  Laterality: Right;  . NO PAST SURGERIES     Past Medical History:  Diagnosis Date  . Headache syndrome 12/18/2016  . High blood pressure   . Hypertension    followed by Dr Amanda Collier medications  . Leg swelling    Hx of   BP 136/85   Pulse 75   Temp 97.9 F (36.6 C)   Ht 5\' 4"  (1.626 m)   Wt 144 lb 9.6 oz (65.6 kg)   LMP 11/14/2003   SpO2 98%   BMI 24.82 kg/m   Opioid Risk Score:   Fall Risk Score:  `1  Depression screen PHQ 2/9  Depression screen PHQ 2/9 09/29/2020  Decreased Interest 0  Down, Depressed, Hopeless 0  PHQ - 2 Score 0    Review of Systems  Constitutional: Negative.   HENT: Negative.   Eyes: Negative.   Respiratory: Negative.   Cardiovascular: Negative.   Gastrointestinal: Negative.   Endocrine: Negative.   Genitourinary:       Having leakage, wears a depends  Musculoskeletal: Negative.   Skin: Negative.   Neurological: Positive for dizziness.       Has vertigo only at night and focused on right side  Hematological: Negative.   Psychiatric/Behavioral: Negative.   All other systems reviewed and are negative.      Objective:   Physical Exam General: No acute distress HEENT: EOMI, oral membranes moist Cards: reg rate  Chest: normal effort Abdomen: Soft, NT, ND Skin: dry, intact Extremities: no edema Neuro: Alert and oriented x 3. Normal insight and awareness. Intact Memory. Normal language and speech. Mild right sided hearing loss with finger rub. 4 beats of nystagmus with gaze to right. Motor nearly 5/5 sensory function intact. Musculoskeletal: normal ROM. No pain Psych: pleasant, normal affect.  minimally tangential           Assessment &  Plan:  1.  Functional deficits secondary to large subacute traumatic subdural hematomas status post craniotomy on 06/24/2020 with recurrent hematoma and right craniotomy on 06/26/2020.  I believe some of her dizziness may be vestibular based on her description             -HEP 2. .  Pain management/low back pain/chronic headaches.              -Topamax 75 mg nightly and Depakote 250/750 daily for headaches per baseline 3. Mood/behavior             -  Ritalin-- can stop 4.  Dorsal subdural hematoma extending through the length of the visualized spine to the L5-S1 level             -Follow-up imaging at the discretion of neurosurgery 5.  History of EtOH use--permitted her to have HALF a glass of wine on special occasions 6.  Recent nondisplaced left lateral malleolus and right lateral malleolus fractures             -Resolved with normal weightbearing 7.  Hyponatremia, resolved 8.  Neurogenic bladder--resolved 9.  Constipation             -increased fiber and prunes are working for her! 10. Right sided hearing loss. ?sensorineural hearing loss   -ENT referral, Dr. Benjamine Mola   -needs audiology referral, but i'll leave it up to Dr. Benjamine Mola   15 minutes of face to face patient care time were spent during this visit. All questions were encouraged and answered. Follow up with me in 21mos.

## 2020-10-02 ENCOUNTER — Other Ambulatory Visit: Payer: Self-pay | Admitting: Physical Medicine & Rehabilitation

## 2020-10-03 DIAGNOSIS — Z23 Encounter for immunization: Secondary | ICD-10-CM | POA: Diagnosis not present

## 2020-10-22 ENCOUNTER — Other Ambulatory Visit: Payer: Self-pay | Admitting: Physical Medicine & Rehabilitation

## 2020-11-01 ENCOUNTER — Telehealth: Payer: Self-pay

## 2020-11-01 NOTE — Telephone Encounter (Addendum)
Amanda Collier called for refill of Rx Losartan & Depakote. Patient advised to call her PCP Dr. Justin Mend  per rx note for Losartan.   Please send Rx Depakote to pharmacy for refill.   Thank you.  Call back ph [phone].

## 2020-11-01 NOTE — Telephone Encounter (Signed)
error 

## 2020-11-02 MED ORDER — DIVALPROEX SODIUM ER 250 MG PO TB24: 250.0000 mg | ORAL_TABLET | ORAL | 3 refills | Status: AC

## 2020-11-02 NOTE — Addendum Note (Signed)
Addended by: Alger Simons T on: 11/02/2020 12:42 PM   Modules accepted: Orders

## 2020-11-02 NOTE — Telephone Encounter (Signed)
depakote refilled. thanks

## 2020-11-08 DIAGNOSIS — E1169 Type 2 diabetes mellitus with other specified complication: Secondary | ICD-10-CM | POA: Diagnosis not present

## 2020-11-08 DIAGNOSIS — Z23 Encounter for immunization: Secondary | ICD-10-CM | POA: Diagnosis not present

## 2020-11-08 DIAGNOSIS — I10 Essential (primary) hypertension: Secondary | ICD-10-CM | POA: Diagnosis not present

## 2020-11-08 DIAGNOSIS — Z09 Encounter for follow-up examination after completed treatment for conditions other than malignant neoplasm: Secondary | ICD-10-CM | POA: Diagnosis not present

## 2020-11-08 DIAGNOSIS — E7800 Pure hypercholesterolemia, unspecified: Secondary | ICD-10-CM | POA: Diagnosis not present

## 2020-11-10 LAB — BASIC METABOLIC PANEL
CO2: 27 — AB (ref 13–22)
Creatinine: 0.7 (ref 0.5–1.1)
Glucose: 106
Potassium: 3.8 (ref 3.4–5.3)
Sodium: 141 (ref 137–147)

## 2020-11-10 LAB — CBC AND DIFFERENTIAL: Hemoglobin: 5.5 — AB (ref 12.0–16.0)

## 2020-11-10 LAB — LIPID PANEL
Cholesterol: 164 (ref 0–200)
LDL Cholesterol: 82
Triglycerides: 75 (ref 40–160)

## 2020-11-10 LAB — HEMOGLOBIN A1C: Hemoglobin A1C: 6.2

## 2020-11-10 LAB — COMPREHENSIVE METABOLIC PANEL
Calcium: 9.4 (ref 8.7–10.7)
GFR calc non Af Amer: 83

## 2020-12-14 DIAGNOSIS — H903 Sensorineural hearing loss, bilateral: Secondary | ICD-10-CM | POA: Diagnosis not present

## 2020-12-14 DIAGNOSIS — R42 Dizziness and giddiness: Secondary | ICD-10-CM | POA: Diagnosis not present

## 2020-12-14 DIAGNOSIS — H6122 Impacted cerumen, left ear: Secondary | ICD-10-CM | POA: Diagnosis not present

## 2020-12-14 DIAGNOSIS — H838X3 Other specified diseases of inner ear, bilateral: Secondary | ICD-10-CM | POA: Diagnosis not present

## 2020-12-24 ENCOUNTER — Other Ambulatory Visit: Payer: Self-pay

## 2020-12-24 ENCOUNTER — Encounter: Payer: Self-pay | Admitting: Family Medicine

## 2020-12-24 ENCOUNTER — Ambulatory Visit (INDEPENDENT_AMBULATORY_CARE_PROVIDER_SITE_OTHER): Payer: Medicare Other | Admitting: Family Medicine

## 2020-12-24 VITALS — BP 135/78 | HR 74 | Temp 97.9°F | Ht 64.0 in | Wt 155.4 lb

## 2020-12-24 DIAGNOSIS — R7303 Prediabetes: Secondary | ICD-10-CM | POA: Insufficient documentation

## 2020-12-24 DIAGNOSIS — E119 Type 2 diabetes mellitus without complications: Secondary | ICD-10-CM | POA: Diagnosis not present

## 2020-12-24 DIAGNOSIS — E782 Mixed hyperlipidemia: Secondary | ICD-10-CM

## 2020-12-24 DIAGNOSIS — Z9889 Other specified postprocedural states: Secondary | ICD-10-CM | POA: Diagnosis not present

## 2020-12-24 DIAGNOSIS — N319 Neuromuscular dysfunction of bladder, unspecified: Secondary | ICD-10-CM | POA: Diagnosis not present

## 2020-12-24 DIAGNOSIS — I10 Essential (primary) hypertension: Secondary | ICD-10-CM | POA: Diagnosis not present

## 2020-12-24 DIAGNOSIS — F3110 Bipolar disorder, current episode manic without psychotic features, unspecified: Secondary | ICD-10-CM | POA: Diagnosis not present

## 2020-12-24 DIAGNOSIS — Z860100 Personal history of colon polyps, unspecified: Secondary | ICD-10-CM | POA: Insufficient documentation

## 2020-12-24 DIAGNOSIS — E7800 Pure hypercholesterolemia, unspecified: Secondary | ICD-10-CM | POA: Insufficient documentation

## 2020-12-24 DIAGNOSIS — E1169 Type 2 diabetes mellitus with other specified complication: Secondary | ICD-10-CM | POA: Insufficient documentation

## 2020-12-24 DIAGNOSIS — I6203 Nontraumatic chronic subdural hemorrhage: Secondary | ICD-10-CM

## 2020-12-24 NOTE — Progress Notes (Signed)
Subjective  CC:  Chief Complaint  Patient presents with  . New Patient (Initial Visit)    HPI: Amanda Collier is a 75 y.o. female who presents to [facility] today to establish care with me as a new patient.   She has the following concerns or needs:  75 year old married female mother of 2 grown children presents to establish care.  I have reviewed multiple records.  She was hospitalized in August of last year due to persistent falls, gait abnormality and subsequent identification of 2 subdural hematomas.  She has had a complicated course.  To summarize, she was admitted and had craniotomies to evacuate the hematomas.  She had a prolonged hospital course and a rehabilitation stay to help her resolve the complications related to the hematomas.  She is now much more stable, walking independently and has gained strength back.  We discussed her hospital course and past medical history in detail.  She is transferring care from [facility] primary care office mainly to help coordinate care through [facility].  She brings in medical records from her most recent office visits.  I reviewed multiple lab tests from her primary care doctor from December, August and all of the hospital labs and imaging studies.  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  She no longer is having low back pain.  Her gait is much improved.  She does have follow-up with neurology.  She also has chronic headaches.  She takes Topamax.  She was started on Depakote to help seizure prophylaxis but also was taking this for psychiatric disorder.  Bipolar disorder with anxiety: Managed by Dr. Toy Care.  She feels this is stable.  She will have manic episodes.  Chronic daily alcohol use.  High risk.  Controlled diabetes on Metformin 500 daily.  Most recent A1c was 5.5 in December.  This is a fairly new diagnosis.  Her immunizations are up-to-date.  She is due an eye exam.  She is not on an ACE inhibitor.  She is not on  a statin.  Hypertension controlled with HCTZ.  Reviewed recent lab work.  Potassium and renal function is normal.  She denies chest pain or cardiovascular disease.  She has had history of lower extremity edema.  Neurogenic bladder: Secondary to blood around the spinal cord.  History of polyps.  Last colonoscopy was November 2020.  Health maintenance: Mammogram is up-to-date.  Bone density is reportedly up-to-date and was normal.  All immunizations are up-to-date.  Social history: Married, 2 grown children, adopted.  Assessment  1. Controlled type 2 diabetes mellitus without complication, without long-term current use of insulin (HCC)   2. Subdural hematoma, chronic (HCC)   3. Status post craniotomy   4. Essential hypertension   5. Neurogenic bladder   6. 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 Personal history of colonic polyps   8. Mixed hyperlipidemia      Plan   Mild type II controlled diabetes.  We will stop Metformin and recheck A1c in 3 months.  Immunizations up-to-date.  Recommend eye exam.  Will check urine to see if ACE inhibitor is indicated.  Will likely want to start a statin as well.  Mixed hyperlipidemia with diabetes.  Consider statin as she stabilizes.  History of chronic subdural hematoma and hospitalization for evacuation with bilateral craniotomies: Follow-up with neurology.  Improving.  High fall risk.  Neurogenic bladder.  Mood disorder per psychiatry.  Hypertension: On HCTZ.  Would consider ACE inhibitor.  Currently controlled.  Recheck in 3 months.  Health maintenance: Up-to-date.  Needs eye exam.  Immunizations are up-to-date.  Today's visit was 60 minutes long. Greater than 50% of this time was devoted to face to face counseling with the patient and coordination of care. We discussed her diagnosis, prognosis, treatment options and treatment plan is documented above.    Follow up: 3 months to follow-up diabetes, hypertension and recheck No orders of the  defined types were placed in this encounter.  No orders of the defined types were placed in this encounter.    Depression screen PHQ 2/9 09/29/2020  Decreased Interest 0  Down, Depressed, Hopeless 0  PHQ - 2 Score 0    We updated and reviewed the patient's past history in detail and it is documented below.  Patient Active Problem List   Diagnosis Date Noted  . Mixed hyperlipidemia 12/26/2020  . 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 Personal history of colonic polyps 12/24/2020  . Pure hypercholesterolemia 12/24/2020  . Controlled type 2 diabetes mellitus without complication, without long-term current use of insulin ([place]) 12/24/2020  . Right-sided sensorineural hearing loss 09/29/2020  . Vascular headache   . Neurogenic bladder   . 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 Alcohol abuse   . Spinal stenosis in cervical region   . Status post craniotomy 06/24/2020  . Subdural hematoma, chronic ([place]) 06/23/2020  . Headache syndrome 12/18/2016  . Essential hypertension 12/17/2015   Health Maintenance  Topic Date Due  . Hepatitis C Screening  Never done  . OPHTHALMOLOGY EXAM  Never done  . COVID-19 Vaccine (3 - Booster for Pfizer series) 07/15/2020  . HEMOGLOBIN A1C  05/11/2021  . FOOT EXAM  12/24/2021  . COLONOSCOPY (Pts 45-57yrs Insurance coverage will need to be confirmed)  03/20/2023  . DEXA SCAN  08/12/2024  . TETANUS/TDAP  02/22/2027  . INFLUENZA VACCINE  Completed  . PNA vac Low Risk Adult  Completed   Immunization History  Administered Date(s) Administered  . Influenza Split 09/05/2006, 09/03/2008, 08/24/2009, 08/08/2010, 08/30/2012, 09/17/2013, 09/06/2017, 09/16/2018  . Influenza, High Dose Seasonal PF 08/30/2012  . Influenza,inj,Quad PF,6+ Mos 07/30/2019  . Influenza,inj,quad, With Preservative 08/18/2014, 09/24/2015  . PFIZER(Purple Top)SARS-COV-2 Vaccination 12/19/2019, 01/13/2020  . Pneumococcal Conjugate-13  04/13/2014  . Pneumococcal Polysaccharide-23 10/18/2011  . Tdap 01/30/2007, 02/21/2017  . Zoster 09/28/2008, 03/25/2020, 11/08/2020   Current Meds  Medication Sig  . acetaminophen (TYLENOL) 325 MG tablet Take 2 tablets (650 mg total) by mouth every 6 (six) hours as needed for mild pain (or Fever >/= 101).  Marland Kitchen aspirin 81 MG EC tablet Take 81 mg by mouth in the morning and at bedtime. Swallow whole.  Marland Kitchen atorvastatin (LIPITOR) 10 MG tablet Take 1 tablet (10 mg total) by mouth daily.  . clonazePAM (KLONOPIN) 0.5 MG tablet Take 1 tablet (0.5 mg total) by mouth 2 (two) times daily as needed (for anxiety).  Marland Kitchen divalproex (DEPAKOTE ER) 250 MG 24 hr tablet Take 1-3 tablets (250-750 mg total) by mouth See admin instructions. Take 250 mg by mouth in the morning and 750 mg at bedtime  . losartan (COZAAR) 25 MG tablet Take 1 tablet (25 mg total) by mouth daily.  Marland Kitchen topiramate (TOPAMAX) 50 MG tablet Take 1.5 tablets (75 mg total) by mouth at bedtime.  . Vitamin D, Ergocalciferol, (DRISDOL) 1.25 MG (50000 UNIT) CAPS capsule Take 1 capsule (50,000 Units total) by mouth every Wednesday.  . [  DISCONTINUED] metFORMIN (GLUCOPHAGE-XR) 500 MG 24 hr tablet Take 1 tablet (500 mg total) by mouth daily before supper.    Allergies: Patient is allergic to codeine, lexapro [escitalopram oxalate], lisinopril, and lotensin [benazepril hcl]. Past Medical History Patient  has a past medical history of Anxiety, Diabetes mellitus without complication ([place]), Headache syndrome (12/18/2016), High blood pressure, and Hypertension. Past Surgical History Patient  has a past surgical history that includes Craniotomy (Bilateral, 06/24/2020) and Craniotomy (Right, 06/26/2020). Family History: Patient family history includes Heart attack in her maternal uncle. She was adopted. Social History:  Patient  reports that she has never smoked. She has never used smokeless tobacco. She reports current alcohol use of about 7.0 standard drinks of  alcohol per week. She reports that she does not use drugs.  Review of Systems: Constitutional: negative for fever or malaise Ophthalmic: negative for photophobia, double vision or loss of vision Cardiovascular: negative for chest pain, dyspnea on exertion, or new LE swelling Respiratory: negative for SOB or persistent cough Gastrointestinal: negative for abdominal pain, change in bowel habits or melena Genitourinary: negative for dysuria or gross hematuria Musculoskeletal: negative for new gait disturbance or muscular weakness Integumentary: negative for new or persistent rashes Neurological: negative for TIA or stroke symptoms Psychiatric: negative for SI or delusions Allergic/Immunologic: negative for hives  Patient Care Team    Relationship Specialty Notifications Start End  Leamon Arnt, MD PCP - General Family Medicine  12/24/20   Chucky May, MD Consulting Physician Psychiatry  12/18/16   Ashok Pall, MD Consulting Physician Neurosurgery  12/24/20   Marcial Pacas, MD Consulting Physician Neurology  12/24/20   Meredith Staggers, MD Consulting Physician Physical Medicine and Rehabilitation  12/24/20     Objective  Vitals: BP 135/78   Pulse 74   Temp 97.9 F (36.6 C) (Temporal)   Ht 5\' 4"  (1.626 m)   Wt 155 lb 6.4 oz (70.5 kg)   LMP 11/14/2003   SpO2 96%   BMI 26.67 kg/m  General:  Well developed, well nourished, no acute distress  Psych:  Alert and oriented,normal mood and affect HEENT:  Normocephalic, atraumatic, non-icteric sclera, supple neck without adenopathy, mass or thyromegaly Cardiovascular:  RRR without gallop, rub or murmur Respiratory:  Good breath sounds bilaterally, CTAB with normal respiratory effort Gastrointestinal: normal bowel sounds, soft, non-tender,    Commons side effects, risks, benefits, and alternatives for medications and treatment plan prescribed today were discussed, and the patient expressed understanding of the given instructions. Patient  is instructed to call or message via MyChart if he/she has any questions or concerns regarding our treatment plan. No barriers to understanding were identified. We discussed Red Flag symptoms and signs in detail. Patient expressed understanding regarding what to do in case of urgent or emergency type symptoms.   Medication list was reconciled, printed and provided to the patient in AVS. Patient instructions and summary information was reviewed with the patient as documented in the AVS. This note was prepared with assistance of Dragon voice recognition software. Occasional wrong-word or sound-a-like substitutions may have occurred due to the inherent limitations of voice recognition software  This visit occurred during the SARS-CoV-2 public health emergency.  Safety protocols were in place, including screening questions prior to the visit, additional usage of staff PPE, and extensive cleaning of exam room while observing appropriate contact time as indicated for disinfecting solutions.

## 2020-12-24 NOTE — Patient Instructions (Addendum)
Please return in 3 months for diabetes follow up and recheck.   Please stop your metformin.  It was a pleasure meeting you today! Thank you for choosing [facility] to meet your healthcare needs! I truly look forward to working with you. If you have any questions or concerns, please send me a message via Mychart or call the office at [phone].

## 2020-12-26 ENCOUNTER — Encounter: Payer: Self-pay | Admitting: Family Medicine

## 2020-12-26 DIAGNOSIS — E782 Mixed hyperlipidemia: Secondary | ICD-10-CM | POA: Insufficient documentation

## 2020-12-26 DIAGNOSIS — E1169 Type 2 diabetes mellitus with other specified complication: Secondary | ICD-10-CM | POA: Insufficient documentation

## 2021-01-26 ENCOUNTER — Telehealth: Payer: Self-pay | Admitting: Family Medicine

## 2021-01-26 ENCOUNTER — Ambulatory Visit: Payer: Medicare Other | Admitting: Physical Medicine & Rehabilitation

## 2021-01-26 NOTE — Telephone Encounter (Signed)
Left message for patient to call back and schedule Medicare Annual Wellness Visit (AWV) either virtually or in office. No detailed message left   Last AWV no information  please schedule at anytime with [facility] Nurse Health Advisor 1 or 2   This should be a 45 minute visit. 

## 2021-02-14 ENCOUNTER — Ambulatory Visit (INDEPENDENT_AMBULATORY_CARE_PROVIDER_SITE_OTHER): Payer: Medicare Other | Admitting: Obstetrics & Gynecology

## 2021-02-14 ENCOUNTER — Encounter (HOSPITAL_BASED_OUTPATIENT_CLINIC_OR_DEPARTMENT_OTHER): Payer: Self-pay | Admitting: Obstetrics & Gynecology

## 2021-02-14 ENCOUNTER — Other Ambulatory Visit: Payer: Self-pay

## 2021-02-14 VITALS — BP 146/89 | HR 90 | Resp 18 | Ht 62.0 in | Wt 157.2 lb

## 2021-02-14 DIAGNOSIS — Z9189 Other specified personal risk factors, not elsewhere classified: Secondary | ICD-10-CM | POA: Diagnosis not present

## 2021-02-14 DIAGNOSIS — Z01419 Encounter for gynecological examination (general) (routine) without abnormal findings: Secondary | ICD-10-CM | POA: Diagnosis not present

## 2021-02-14 DIAGNOSIS — Z780 Asymptomatic menopausal state: Secondary | ICD-10-CM

## 2021-02-14 DIAGNOSIS — R32 Unspecified urinary incontinence: Secondary | ICD-10-CM

## 2021-02-14 NOTE — Progress Notes (Signed)
75 y.o. G2P2 Married White or Caucasian female here for breast and pelvic exam.  Had traumatic subdural hematoma.  Had craniotomy with Dr. Christella Noa.  Did PT for 6 weeks.  Doing well.  Denies vaginal bleeding.  Still followed by Dr. Toy Care.  Denies vaginal bleeding.  She reports since the craniotomy, she's being having more leakage than she had in the past.  She's had some leakage in the past but small amounts and used a mini pad.  Doesn't leak with coughing, laughing, sneezing.  She feels it worse with drinking caffeine and first think in the morning she realizes she's filled a pad.  Patient's last menstrual period was 11/14/2003.          Sexually active: Yes.    H/O STD:  yes  Health Maintenance: PCP:  Dr. Jonni Sanger.  Last wellness appt was 12/2020.  Did blood work at that appt:  No, done 09/2020 Vaccines are up to date:  yes Colonoscopy:  09/2019  MMG:  07/2019 BMD:  2020, normal Last pap smear:  2019, normal.   H/o abnormal pap smear:  no   reports that she has never smoked. She has never used smokeless tobacco. She reports current alcohol use of about 7.0 standard drinks of alcohol per week. She reports that she does not use drugs.  Past Medical History:  Diagnosis Date  . Anxiety   . Diabetes mellitus without complication ([place])   . Headache syndrome 12/18/2016  . High blood pressure   . Hypertension    followed by Dr Gillian Shields medications    Past Surgical History:  Procedure Laterality Date  . CRANIOTOMY Bilateral 06/24/2020   Procedure: Bilateral craniotomy for evacuation of subdural hematoma;  Surgeon: Ashok Pall, MD;  Location: [facility];  Service: Neurosurgery;  Laterality: Bilateral;  . CRANIOTOMY Right 06/26/2020   Procedure: CRANIOTOMY HEMATOMA EVACUATION SUBDURAL;  Surgeon: Ashok Pall, MD;  Location: [place];  Service: Neurosurgery;  Laterality: Right;    Current Outpatient Medications  Medication Sig Dispense Refill  . acetaminophen (TYLENOL) 325 MG tablet  Take 2 tablets (650 mg total) by mouth every 6 (six) hours as needed for mild pain (or Fever >/= 101).    Marland Kitchen amoxicillin (AMOXIL) 500 MG capsule Take 500 mg by mouth 3 (three) times daily.    Marland Kitchen aspirin 81 MG EC tablet Take 81 mg by mouth in the morning and at bedtime. Swallow whole.    . clonazePAM (KLONOPIN) 0.5 MG tablet Take 1 tablet (0.5 mg total) by mouth 2 (two) times daily as needed (for anxiety). 20 tablet 0  . divalproex (DEPAKOTE ER) 250 MG 24 hr tablet Take 1-3 tablets (250-750 mg total) by mouth See admin instructions. Take 250 mg by mouth in the morning and 750 mg at bedtime 180 tablet 3  . losartan (COZAAR) 25 MG tablet Take 1 tablet (25 mg total) by mouth daily. 30 tablet 0  . topiramate (TOPAMAX) 50 MG tablet Take 1.5 tablets (75 mg total) by mouth at bedtime. 120 tablet 0  . Vitamin D, Ergocalciferol, (DRISDOL) 1.25 MG (50000 UNIT) CAPS capsule Take 1 capsule (50,000 Units total) by mouth every Wednesday. 5 capsule 0  . zolpidem (AMBIEN) 10 MG tablet Take 10 mg by mouth at bedtime as needed.     No current facility-administered medications for this visit.    Family History  Adopted: Yes  Problem Relation Age of Onset  . Heart attack Maternal Uncle        ? unknown  if correct/patient adopted    Review of Systems  All other systems reviewed and are negative.   Exam:   BP (!) 146/89   Pulse 90   Resp 18   Ht 5\' 2"  (1.575 m)   Wt 157 lb 3.2 oz (71.3 kg)   LMP 11/14/2003   BMI 28.75 kg/m   Height: 5\' 2"  (157.5 cm)  General appearance: alert, cooperative and appears stated age Breasts: normal appearance, no masses or tenderness Abdomen: soft, non-tender; bowel sounds normal; no masses,  no organomegaly Lymph nodes: Cervical, supraclavicular, and axillary nodes normal.  No abnormal inguinal nodes palpated Neurologic: Grossly normal  Pelvic: External genitalia:  no lesions              Urethra:  normal appearing urethra with no masses, tenderness or lesions               Bartholins and Skenes: normal                 Vagina: normal appearing vagina with normal color and discharge, no lesions              Cervix: no lesions              Pap taken: No. Bimanual Exam:  Uterus:  normal size, contour, position, consistency, mobility, non-tender              Adnexa: normal adnexa and no mass, fullness, tenderness               Rectovaginal: Confirms               Anus:  normal sphincter tone, no lesions  Pt can do a very minimal Kegel squeeze.  Chaperone, Shela Nevin, CMA, was present for exam.  Assessment/Plan: 1. GYN exam for high-risk Medicare patient - normal pap smear 2019 - last MMG 2020.  Pt will plan to schedule this year. - colonoscopy 2020.  Pt states no follow up recommended - BMD 07/2019 normal - lab work done 09/2020 - vaccines reviewed  2. Postmenopausal - PMP  3. Urinary incontinence, unspecified type - this acutely worsened after subdural hematoma.  Does not have classic description of urge or stress incontinence.  D/w pt concerns that neurogenic bladder may be involved.  Referral to urogyn recommended.  Pt comfortable with plan. - PT referral placed today as well  Total time with pt:  25 minutes.  Discussion of incontinence and treatment/referrals discussed as well.

## 2021-03-09 ENCOUNTER — Encounter: Payer: Self-pay | Admitting: Physical Medicine & Rehabilitation

## 2021-03-09 ENCOUNTER — Encounter: Payer: Medicare Other | Attending: Physical Medicine & Rehabilitation | Admitting: Physical Medicine & Rehabilitation

## 2021-03-09 ENCOUNTER — Other Ambulatory Visit: Payer: Self-pay

## 2021-03-09 ENCOUNTER — Ambulatory Visit: Payer: Medicare Other | Admitting: Physical Medicine & Rehabilitation

## 2021-03-09 VITALS — BP 146/83 | HR 80 | Temp 97.9°F | Ht 64.0 in | Wt 162.0 lb

## 2021-03-09 DIAGNOSIS — H9041 Sensorineural hearing loss, unilateral, right ear, with unrestricted hearing on the contralateral side: Secondary | ICD-10-CM | POA: Diagnosis not present

## 2021-03-09 DIAGNOSIS — Z87820 Personal history of traumatic brain injury: Secondary | ICD-10-CM | POA: Diagnosis not present

## 2021-03-09 NOTE — Patient Instructions (Signed)
PLEASE FEEL FREE TO CALL OUR OFFICE WITH ANY PROBLEMS OR QUESTIONS ([phone])      

## 2021-03-09 NOTE — Progress Notes (Signed)
Subjective:    Patient ID: Amanda Collier, female    DOB: 05/16/1946, 75 y.o.   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 She has continued to progress. Dr. Benjamine Mola saw her and found that she had only age related hearing loss. No treatment or hearing aid was recommended.   Cognitively she's improving and near her baseline.   Headaches are well controlled at present on topamax and depakote. She has been followed chronically by [facility] for her headaches.   She has travelled on a couple occasions without any issues. She hasn't fallen but tries to be careful about her shoewear and where she walks.         Pain Inventory Average Pain 0 Pain Right Now 0 My pain is no pain  In the last 24 hours, has pain interfered with the following? General activity 0 Relation with others 0 Enjoyment of life 0 What TIME of day is your pain at its worst?no pain  Sleep (in general) Good  Pain is worse with: no pain Pain improves with: no pain Relief from Meds: no pain  Family History  Adopted: Yes  Problem Relation Age of Onset  . Heart attack Maternal Uncle        ? unknown if correct/patient adopted   Social History   Socioeconomic History  . Marital status: Married    Spouse name: Asencion Guisinger  . Number of children: 2  . Years of education: Teacher, English as a foreign language, almost masters  . Highest education level: Not on file  Occupational History  . Not on file  Tobacco Use  . Smoking status: Never Smoker  . Smokeless tobacco: Never Used  Vaping Use  . Vaping Use: Never used  Substance and Sexual Activity  . Alcohol use: Yes    Alcohol/week: 7.0 standard drinks    Types: 7 Standard drinks or equivalent per week  . Drug use: No  . Sexual activity: Yes    Partners: Male    Birth control/protection: Post-menopausal    Comment: vasectomy  Other Topics Concern  . Not on file  Social History Narrative  . Not on file   Social Determinants of Health   Financial Resource  Strain: Not on file  Food Insecurity: Not on file  Transportation Needs: Not on file  Physical Activity: Not on file  Stress: Not on file  Social Connections: Not on file   Past Surgical History:  Procedure Laterality Date  . CRANIOTOMY Bilateral 06/24/2020   Procedure: Bilateral craniotomy for evacuation of subdural hematoma;  Surgeon: Ashok Pall, MD;  Location: [place];  Service: Neurosurgery;  Laterality: Bilateral;  . CRANIOTOMY Right 06/26/2020   Procedure: CRANIOTOMY HEMATOMA EVACUATION SUBDURAL;  Surgeon: Ashok Pall, MD;  Location: [place];  Service: Neurosurgery;  Laterality: Right;   Past Surgical History:  Procedure Laterality Date  . CRANIOTOMY Bilateral 06/24/2020   Procedure: Bilateral craniotomy for evacuation of subdural hematoma;  Surgeon: Ashok Pall, MD;  Location: [facility];  Service: Neurosurgery;  Laterality: Bilateral;  . CRANIOTOMY Right 06/26/2020   Procedure: CRANIOTOMY HEMATOMA EVACUATION SUBDURAL;  Surgeon: Ashok Pall, MD;  Location: [facility];  Service: Neurosurgery;  Laterality: Right;   Past Medical History:  Diagnosis Date  . Anxiety   . Diabetes mellitus without complication ([place])   . Headache syndrome 12/18/2016  . High blood pressure   . Hypertension    followed by Dr Gillian Shields medications   BP (!) 146/83  Pulse 80   Temp 97.9 F (36.6 C)   Ht 5\' 4"  (1.626 m)   Wt 162 lb (73.5 kg)   LMP 11/14/2003   SpO2 98%   BMI 27.81 kg/m   Opioid Risk Score:   Fall Risk Score:  `1  Depression screen PHQ 2/9  Depression screen [facility] 2/9 02/14/2021 09/29/2020  Decreased Interest 1 0  Down, Depressed, Hopeless 0 0  PHQ - 2 Score 1 0    Review of Systems  Constitutional: Negative.   HENT: Negative.   Eyes: Negative.   Respiratory: Negative.   Cardiovascular: Negative.   Gastrointestinal: Negative.   Endocrine: Negative.   Genitourinary: Negative.   Musculoskeletal: Negative.   Skin: Negative.   Allergic/Immunologic: Negative.    Neurological: Negative.   Hematological: Negative.   Psychiatric/Behavioral: Negative.   All other systems reviewed and are negative.      Objective:   Physical Exam  General: No acute distress HEENT: EOMI, oral membranes moist Cards: reg rate  Chest: normal effort Abdomen: Soft, NT, ND Skin: dry, intact Extremities: no edema Psych: pleasant and appropriate Neuro: Alert and oriented x 3. Normal insight and awareness. Intact Memory. Normal language and speech. Cranial nerve exam unremarkable. Gait sl wide based. Favors right side a bit. Strength 5/5. Negative romberg.  Musculoskeletal: normal ROM. No pain            Assessment & Plan:  1.  Functional deficits secondary to large subacute traumatic subdural hematomas status post craniotomy on 06/24/2020 with recurrent hematoma and right craniotomy on 06/26/2020.              -HEP 2. .  Pain management/low back pain/chronic headaches.              -Topamax 75 mg nightly and Depakote 250/750 daily per neurology. 3. Mood/behavior             -at baseline 4.  Dorsal subdural hematoma extending through the length of the visualized spine to the L5-S1 level             -f/u per NS 5.  History of EtOH use--permitted her to have HALF a glass of wine on special occasions 6.  Recent nondisplaced left lateral malleolus and right lateral malleolus fractures             -Resolved   7.  Hyponatremia, resolved 8.  Neurogenic bladder--resolved 9.  Constipation             -fruit/fiber 10. 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 All questions were encouraged and answered. Follow up with me in PRN.

## 2021-03-21 ENCOUNTER — Other Ambulatory Visit: Payer: Self-pay

## 2021-03-21 ENCOUNTER — Encounter: Payer: Self-pay | Admitting: Family Medicine

## 2021-03-21 ENCOUNTER — Ambulatory Visit (INDEPENDENT_AMBULATORY_CARE_PROVIDER_SITE_OTHER): Payer: Medicare Other | Admitting: Family Medicine

## 2021-03-21 VITALS — BP 128/78 | HR 68 | Temp 97.4°F | Ht 64.0 in | Wt 161.4 lb

## 2021-03-21 DIAGNOSIS — I10 Essential (primary) hypertension: Secondary | ICD-10-CM | POA: Diagnosis not present

## 2021-03-21 DIAGNOSIS — Z87820 Personal history of traumatic brain injury: Secondary | ICD-10-CM

## 2021-03-21 DIAGNOSIS — N319 Neuromuscular dysfunction of bladder, unspecified: Secondary | ICD-10-CM

## 2021-03-21 DIAGNOSIS — E782 Mixed hyperlipidemia: Secondary | ICD-10-CM

## 2021-03-21 DIAGNOSIS — Z1159 Encounter for screening for other viral diseases: Secondary | ICD-10-CM | POA: Diagnosis not present

## 2021-03-21 DIAGNOSIS — R011 Cardiac murmur, unspecified: Secondary | ICD-10-CM | POA: Diagnosis not present

## 2021-03-21 DIAGNOSIS — R7303 Prediabetes: Secondary | ICD-10-CM | POA: Diagnosis not present

## 2021-03-21 HISTORY — DX: Cardiac murmur, unspecified: R01.1

## 2021-03-21 LAB — COMPREHENSIVE METABOLIC PANEL
ALT: 10 U/L (ref 0–35)
AST: 12 U/L (ref 0–37)
Albumin: 4.2 g/dL (ref 3.5–5.2)
Alkaline Phosphatase: 83 U/L (ref 39–117)
BUN: 25 mg/dL — ABNORMAL HIGH (ref 6–23)
CO2: 25 mEq/L (ref 19–32)
Calcium: 9.1 mg/dL (ref 8.4–10.5)
Chloride: 106 mEq/L (ref 96–112)
Creatinine, Ser: 0.83 mg/dL (ref 0.40–1.20)
GFR: 69 mL/min (ref >60.00)
Glucose, Bld: 105 mg/dL — ABNORMAL HIGH (ref 70–99)
Potassium: 3.5 mEq/L (ref 3.5–5.1)
Sodium: 142 mEq/L (ref 135–145)
Total Bilirubin: 0.6 mg/dL (ref 0.2–1.2)
Total Protein: 6.5 g/dL (ref 6.0–8.3)

## 2021-03-21 LAB — CBC WITH DIFFERENTIAL/PLATELET
Basophils Absolute: 0 10*3/uL (ref 0.0–0.1)
Basophils Relative: 0.7 % (ref 0.0–3.0)
Eosinophils Absolute: 0.2 10*3/uL (ref 0.0–0.7)
Eosinophils Relative: 4.4 % (ref 0.0–5.0)
HCT: 41.9 % (ref 36.0–46.0)
Hemoglobin: 14.3 g/dL (ref 12.0–15.0)
Lymphocytes Relative: 39.7 % (ref 12.0–46.0)
Lymphs Abs: 1.5 10*3/uL (ref 0.7–4.0)
MCHC: 34.1 g/dL (ref 30.0–36.0)
MCV: 97.5 fl (ref 78.0–100.0)
Monocytes Absolute: 0.4 10*3/uL (ref 0.1–1.0)
Monocytes Relative: 9.7 % (ref 3.0–12.0)
Neutro Abs: 1.7 10*3/uL (ref 1.4–7.7)
Neutrophils Relative %: 45.5 % (ref 43.0–77.0)
Platelets: 210 10*3/uL (ref 150.0–400.0)
RBC: 4.3 Mil/uL (ref 3.87–5.11)
RDW: 14.5 % (ref 11.5–15.5)
WBC: 3.8 10*3/uL — ABNORMAL LOW (ref 4.0–10.5)

## 2021-03-21 LAB — LIPID PANEL
Cholesterol: 156 mg/dL (ref 0–200)
HDL: 66.5 mg/dL (ref >39.00)
LDL Cholesterol: 64 mg/dL (ref 0–99)
NonHDL: 89.03
Total CHOL/HDL Ratio: 2
Triglycerides: 127 mg/dL (ref 0.0–149.0)
VLDL: 25.4 mg/dL (ref 0.0–40.0)

## 2021-03-21 LAB — MICROALBUMIN / CREATININE URINE RATIO
Creatinine,U: 116.6 mg/dL
Microalb Creat Ratio: 0.6 mg/g (ref 0.0–30.0)
Microalb, Ur: <0.7 mg/dL (ref 0.0–1.9)

## 2021-03-21 LAB — POCT GLYCOSYLATED HEMOGLOBIN (HGB A1C): Hemoglobin A1C: 5.6 % (ref 4.0–5.6)

## 2021-03-21 LAB — TSH: TSH: 5.85 u[IU]/mL — ABNORMAL HIGH (ref 0.35–4.50)

## 2021-03-21 MED ORDER — SHINGRIX 50 MCG/0.5ML IM SUSR: 0.5000 mL | Freq: Once | INTRAMUSCULAR | 0 refills | Status: AC

## 2021-03-21 NOTE — Progress Notes (Signed)
Subjective  CC:  Chief Complaint  Patient presents with  . Diabetes  . Hypertension    HPI: Amanda Collier is a 75 y.o. female who presents to the office today for follow up of diabetes and problems listed above in the chief complaint.   This is a follow-up visit after establishing care for recheck.  I reviewed her initial note from December.  Complicated past medical history.  Fortunately, she is doing well overall  History of subdural hematoma/brain injury: Has been released from neurology and rehab.  Feels like herself.  Does have residual gait instability and possibly neurogenic bladder.  Urinary incontinence: Reviewed notes from gynecology.  Has been referred to urogynecology.  Hypertension: Takes low-dose losartan 25 mg daily.  No chest pain or shortness of breath.  She does have heart murmurs noted on exam.  Last echocardiogram was documented in 2018 with no valvular abnormalities at that time.  She denies symptoms of lightheadedness, chest pain, shortness of breath or lower extremity edema.  Hyperlipidemia: She is on a statin, atorvastatin 10 mg nightly.  This was updated.  She tolerates this well.  Diabetes follow up: History of diabetes, diet controlled.  Was started on metformin while she was hospitalized.  Likely stress-induced hyperglycemia.  No symptoms of hyperglycemia.  Eating well.  No complications.  She is on an ARB.  Statin.  Immunizations are up-to-date.  She is having her eye exam done tomorrow.  [facility]  Health maintenance: Eligible for Shingrix.  Mammogram scheduled.  She goes to [facility].  Had recent GYN physical.  Reviewed that note.   Wt Readings from Last 3 Encounters:  03/21/21 161 lb 6.4 oz (73.2 kg)  03/09/21 162 lb (73.5 kg)  02/14/21 157 lb 3.2 oz (71.3 kg)    BP Readings from Last 3 Encounters:  03/21/21 128/78  03/09/21 (!) 146/83  02/14/21 (!) 146/89    Assessment  1. Prediabetes   2. Mixed hyperlipidemia   3. Hx of traumatic  brain injury   4. Essential hypertension   5. Need for hepatitis C screening test   6. Heart murmur   7. Neurogenic bladder      Plan   Prediabetes is currently very well controlled.  Continue diet and exercise.  Mixed hyperlipidemia on atorvastatin 10 mg nightly.  Recheck lipid status today.  Check LFTs.  Tolerates well.  History of TBI: Mild deficits from subdural hematomas.  Hypertension is well controlled on low-dose losartan 25 mg daily.  Check renal function and electrolytes.  Heart murmur: Asymptomatic.  Consider repeat echo  Urinary incontinence: Possible neurogenic bladder.  Referral placed by GYN   Shingrix prescription given.  Patient will check with pharmacy to ensure she is eligible.  Follow up: 6 months to recheck hypertension and prediabetes.. Orders Placed This Encounter  Procedures  . Comprehensive metabolic panel  . CBC with Differential/Platelet  . Lipid panel  . TSH  . Microalbumin / creatinine urine ratio  . Hepatitis C antibody  . POCT HgB A1C   Meds ordered this encounter  Medications  . Zoster Vaccine Adjuvanted [facility]) injection    Sig: Inject 0.5 mLs into the muscle once for 1 dose. Please give 2nd dose 2-6 months after first dose    Dispense:  2 each    Refill:  0      Immunization History  Administered Date(s) Administered  . Influenza Split 09/05/2006, 09/03/2008, 08/24/2009, 08/08/2010, 08/30/2012, 09/17/2013, 09/06/2017, 09/16/2018  . Influenza, High Dose Seasonal PF  08/30/2012  . Influenza,inj,Quad PF,6+ Mos 07/30/2019  . Influenza,inj,quad, With Preservative 08/18/2014, 09/24/2015  . PFIZER(Purple Top)SARS-COV-2 Vaccination 12/19/2019, 01/13/2020, 09/23/2020  . Pneumococcal Conjugate-13 04/13/2014  . Pneumococcal Polysaccharide-23 10/18/2011  . Tdap 01/30/2007, 02/21/2017  . Zoster 09/28/2008, 03/25/2020, 11/08/2020    Diabetes Related Lab Review: Lab Results  Component Value Date   HGBA1C 5.6 03/21/2021   HGBA1C 6.2  11/10/2020    No results found for: Derl Barrow Lab Results  Component Value Date   CREATININE 0.7 11/10/2020   BUN 9 08/02/2020   NA 141 11/10/2020   K 3.8 11/10/2020   CL 100 08/02/2020   CO2 27 (A) 11/10/2020   Lab Results  Component Value Date   CHOL 164 11/10/2020   No results found for: HDL Lab Results  Component Value Date   LDLCALC 82 11/10/2020   Lab Results  Component Value Date   TRIG 75 11/10/2020   No results found for: CHOLHDL No results found for: LDLDIRECT The ASCVD Risk score Mikey Bussing DC Jr., et al., 2013) failed to calculate for the following reasons:   Cannot find a previous HDL lab I have reviewed the [facility] history. Patient Active Problem List   Diagnosis Date Noted  . Heart murmur 03/21/2021    Last echo 2018; calcified aortic valve w/o regurg or stenosis. Nl EF.    Marland Kitchen Hx of traumatic brain injury 03/09/2021  . Mixed hyperlipidemia 12/26/2020  . 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 Personal history of colonic polyps 12/24/2020  . Pure hypercholesterolemia 12/24/2020  . Prediabetes 12/24/2020  . Right-sided sensorineural hearing loss 09/29/2020  . Vascular headache   . Neurogenic bladder   . 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 Alcohol abuse   . Spinal stenosis in cervical region   . Status post craniotomy 06/24/2020  . Headache syndrome 12/18/2016  . Essential hypertension 12/17/2015    Social History: Patient  reports that she has never smoked. She has never used smokeless tobacco. She reports current alcohol use of about 7.0 standard drinks of alcohol per week. She reports that she does not use drugs.  Review of Systems: Ophthalmic: negative for eye pain, loss of vision or double vision Cardiovascular: negative for chest pain Respiratory: negative for SOB or persistent cough Gastrointestinal: negative for abdominal pain Genitourinary: negative for dysuria or gross  hematuria MSK: negative for foot lesions Neurologic: negative for weakness or gait disturbance  Objective  Vitals: BP 128/78   Pulse 68   Temp (!) 97.4 F (36.3 C) (Temporal)   Ht 5\' 4"  (1.626 m)   Wt 161 lb 6.4 oz (73.2 kg)   LMP 11/14/2003   SpO2 98%   BMI 27.70 kg/m  General: well appearing, no acute distress  Psych:  Alert and oriented, normal mood and affect HEENT:  Normocephalic, atraumatic, moist mucous membranes, supple neck  Cardiovascular:  Nl S1 and S2, RRR without murmur, gallop or rub. no edema Respiratory:  Good breath sounds bilaterally, CTAB with normal effort, no rales     Diabetic education: ongoing education regarding chronic disease management for diabetes was given today. We continue to reinforce the ABC's of diabetic management: A1c (<7 or 8 dependent upon patient), tight blood pressure control, and cholesterol management with goal LDL < 100 minimally. We discuss diet strategies, exercise recommendations, medication options and possible side effects. At each visit, we review recommended immunizations and preventive care recommendations for diabetics and  stress that good diabetic control can prevent other problems. See below for this patient's data.    Commons side effects, risks, benefits, and alternatives for medications and treatment plan prescribed today were discussed, and the patient expressed understanding of the given instructions. Patient is instructed to call or message via MyChart if he/she has any questions or concerns regarding our treatment plan. No barriers to understanding were identified. We discussed Red Flag symptoms and signs in detail. Patient expressed understanding regarding what to do in case of urgent or emergency type symptoms.   Medication list was reconciled, printed and provided to the patient in AVS. Patient instructions and summary information was reviewed with the patient as documented in the AVS. This note was prepared with  assistance of Dragon voice recognition software. Occasional wrong-word or sound-a-like substitutions may have occurred due to the inherent limitations of voice recognition software  This visit occurred during the SARS-CoV-2 public health emergency.  Safety protocols were in place, including screening questions prior to the visit, additional usage of staff PPE, and extensive cleaning of exam room while observing appropriate contact time as indicated for disinfecting solutions.

## 2021-03-21 NOTE — Patient Instructions (Addendum)
Please return in 6 months for hypertension and sugar follow up.  I will release your lab results to you on your MyChart account with further instructions. Please reply with any questions.   Please take the prescription for Shingrix to the pharmacy so they may administer the vaccinations. Your insurance will then cover the injections.   If you have any questions or concerns, please don't hesitate to send me a message via MyChart or call the office at [phone]. Thank you for visiting with [place] today! It's our pleasure caring for you.

## 2021-03-22 DIAGNOSIS — H2513 Age-related nuclear cataract, bilateral: Secondary | ICD-10-CM | POA: Diagnosis not present

## 2021-03-22 DIAGNOSIS — E119 Type 2 diabetes mellitus without complications: Secondary | ICD-10-CM | POA: Diagnosis not present

## 2021-03-22 DIAGNOSIS — H5203 Hypermetropia, bilateral: Secondary | ICD-10-CM | POA: Diagnosis not present

## 2021-03-22 DIAGNOSIS — H52223 Regular astigmatism, bilateral: Secondary | ICD-10-CM | POA: Diagnosis not present

## 2021-03-22 DIAGNOSIS — I10 Essential (primary) hypertension: Secondary | ICD-10-CM | POA: Diagnosis not present

## 2021-03-22 DIAGNOSIS — H35033 Hypertensive retinopathy, bilateral: Secondary | ICD-10-CM | POA: Diagnosis not present

## 2021-03-22 DIAGNOSIS — H524 Presbyopia: Secondary | ICD-10-CM | POA: Diagnosis not present

## 2021-03-22 LAB — HEPATITIS C ANTIBODY
Hepatitis C Ab: NONREACTIVE
SIGNAL TO CUT-OFF: 0.01 (ref <1.00)

## 2021-03-22 NOTE — Progress Notes (Signed)
Please add on T3 and Free t4, dx: abnormal TSH. Thanks, Dr. Jonni Sanger '

## 2021-03-23 ENCOUNTER — Ambulatory Visit (INDEPENDENT_AMBULATORY_CARE_PROVIDER_SITE_OTHER): Payer: Medicare Other | Admitting: Neurology

## 2021-03-23 ENCOUNTER — Encounter: Payer: Self-pay | Admitting: Neurology

## 2021-03-23 VITALS — BP 119/74 | HR 74 | Ht 63.5 in | Wt 162.0 lb

## 2021-03-23 DIAGNOSIS — Z9889 Other specified postprocedural states: Secondary | ICD-10-CM | POA: Diagnosis not present

## 2021-03-23 DIAGNOSIS — G4489 Other headache syndrome: Secondary | ICD-10-CM | POA: Diagnosis not present

## 2021-03-23 DIAGNOSIS — S065XAA Traumatic subdural hemorrhage with loss of consciousness status unknown, initial encounter: Secondary | ICD-10-CM

## 2021-03-23 DIAGNOSIS — S065X9A Traumatic subdural hemorrhage with loss of consciousness of unspecified duration, initial encounter: Secondary | ICD-10-CM

## 2021-03-23 MED ORDER — TOPIRAMATE 50 MG PO TABS: 50.0000 mg | ORAL_TABLET | Freq: Every day | ORAL | 1 refills | Status: DC

## 2021-03-23 NOTE — Progress Notes (Signed)
PATIENT: Amanda Collier DOB: Dec 21, 1945  REASON FOR VISIT: follow up HISTORY FROM: patient  HISTORY OF PRESENT ILLNESS: Today 03/23/21  Amanda Collier is a 75 year old female who has been followed for migraine headaches.  Had an acute visit in August 2021 for gait abnormality, urgent MRI showed large subacute bilateral subdural hematoma.  Had craniotomy with Dr. Christella Noa June 26, 2020. She doesn't remember any falls with bump to the head.   Postop, had seizure-like activity, speech hesitancy, facial droop, dysarthric speech.  CT head showed fairly large recurrent acute subdural hematoma.  Taken back for repeat hematoma evacuation. During pre-op drained 1L from bladder.   MRI of lumbar spine completed due to low back sacral pain lower extremity weakness 07/04/2020 showed subdural hemorrhage extending through the length of the visualized spine mainly dorsal and most notable in the lower thoracic segments and at the L5-S1 level.  The distal cord mildly deflected by hemorrhage but no cord signal abnormality identified. Moderate flattening of the thecal sac at L5-S1 due to a disc bulge and subdural hemorrhage reviewed by neurology service advised conservative care consideration trial of Neurontin.   After ICU went to rehab at [facility]. Considering seeing urology, urinary leakage. Had PT/OT/ST.   Remains on Topamax 75 mg daily, no headaches at all. Lost 30 lbs in the hospital. No falls, balance has pretty much returned to normal. Is back to normal. Feels she is pretty much back to normal. No further seizure events. Remains on Depakote, for the mood, long-term medication.  Denies any back pain, has adjusted her gait to slightly based to feel more balance. Is pleased with how well she is doing.  Here today for evaluation unaccompanied.  HISTORY 06/23/2020 Dr. Krista Blue: Amanda Collier is a 75 year old female, seen in request by urgent care for from her primary care physician Dr. Maurice Small for evaluation  of acute onset gait abnormality  I reviewed and summarized the referring note.  Past medical history Hypertension Diabetes, 2014 Chronic migraine headaches Depression, Depakote 250mg  tid  Chronic insomnia.  She has been a patient of [facility] in the past few years, saw Dr. Jannifer Franklin, and nurse practitioner Judson Roch, in most recent office visit in May 2021, her migraine headache was under better control, there was no significant gait abnormality documented.  She was highly function, went to [place] her summer house over the past few months, she described needing some help getting up and down to the boat, which is at her baseline, on June 03, 2020, she was traveling alone, flew back from [place] to [place], while rolling her carry on luggage, without clear triggers, she fell in the airport, could not get up from the floor, there was no loss of conscious, no significant injury, she has to be rolled into the airplane by wheelchair, during the airflight, she was able to using bathroom holding on airplane seat,  She has to be ruled out of the airplane by wheelchair, was picked up by her friend at airport, needing help getting in and out of the car, ever since then, she had a gradual worsening gait abnormality, fell multiple times, came in with wheelchair today, she can no longer be independent in her daily activity, she already had long history of urinary urgency, no noticed worsening urinary urgency, occasionally incontinence, wear depends, has chronic constipation,  She denies bilateral upper extremity weakness or sensory loss, denies lower extremity sensory loss, she was seen by orthopedic surgeon Dr. Louanne Skye, extra recently, x-ray  of bilateral ankles showed right ankle nondisplaced distal fibula fracture, nondisplaced left distal left fibular fracture, but she denies significant pain,   REVIEW OF SYSTEMS: Out of a complete 14 system review of symptoms, the patient complains only of the  following symptoms, and all other reviewed systems are negative.  See HPI  ALLERGIES: Allergies  Allergen Reactions  . Codeine Nausea Only  . Lexapro [Escitalopram Oxalate] Other (See Comments)    GI upset  . Lisinopril Cough  . Lotensin [Benazepril Hcl] Other (See Comments)    Headaches    HOME MEDICATIONS: Outpatient Medications Prior to Visit  Medication Sig Dispense Refill  . amoxicillin (AMOXIL) 500 MG capsule Take 500 mg by mouth 3 (three) times daily.    Marland Kitchen aspirin 81 MG EC tablet Take 81 mg by mouth in the morning and at bedtime. Swallow whole.    Marland Kitchen atorvastatin (LIPITOR) 10 MG tablet Take 10 mg by mouth daily.    . divalproex (DEPAKOTE ER) 250 MG 24 hr tablet Take 1-3 tablets (250-750 mg total) by mouth See admin instructions. Take 250 mg by mouth in the morning and 750 mg at bedtime 180 tablet 3  . losartan (COZAAR) 25 MG tablet Take 1 tablet (25 mg total) by mouth daily. 30 tablet 0  . Vitamin D, Ergocalciferol, (DRISDOL) 1.25 MG (50000 UNIT) CAPS capsule Take 1 capsule (50,000 Units total) by mouth every Wednesday. 5 capsule 0  . zolpidem (AMBIEN) 10 MG tablet Take 10 mg by mouth at bedtime as needed.    . topiramate (TOPAMAX) 50 MG tablet Take 1.5 tablets (75 mg total) by mouth at bedtime. 120 tablet 0   No facility-administered medications prior to visit.    PAST MEDICAL HISTORY: Past Medical History:  Diagnosis Date  . Anxiety   . Diabetes mellitus without complication ([place])   . Headache syndrome 12/18/2016  . Heart murmur 03/21/2021   Last echo 2018; calcified aortic valve w/o regurg or stenosis. Nl EF.   . High blood pressure   . Hypertension    followed by Dr Gillian Shields medications    PAST SURGICAL HISTORY: Past Surgical History:  Procedure Laterality Date  . CRANIOTOMY Bilateral 06/24/2020   Procedure: Bilateral craniotomy for evacuation of subdural hematoma;  Surgeon: Ashok Pall, MD;  Location: [facility];  Service: Neurosurgery;  Laterality:  Bilateral;  . CRANIOTOMY Right 06/26/2020   Procedure: CRANIOTOMY HEMATOMA EVACUATION SUBDURAL;  Surgeon: Ashok Pall, MD;  Location: [facility];  Service: Neurosurgery;  Laterality: Right;    FAMILY HISTORY: Family History  Adopted: Yes  Problem Relation Age of Onset  . Heart attack Maternal Uncle        ? unknown if correct/patient adopted    SOCIAL HISTORY: Social History   Socioeconomic History  . Marital status: Married    Spouse name: Yovana Scogin  . Number of children: 2  . Years of education: Teacher, English as a foreign language, almost masters  . Highest education level: Not on file  Occupational History  . Not on file  Tobacco Use  . Smoking status: Never Smoker  . Smokeless tobacco: Never Used  Vaping Use  . Vaping Use: Never used  Substance and Sexual Activity  . Alcohol use: Yes    Alcohol/week: 7.0 standard drinks    Types: 7 Standard drinks or equivalent per week  . Drug use: No  . Sexual activity: Yes    Partners: Male    Birth control/protection: Post-menopausal    Comment: vasectomy  Other Topics Concern  .  Not on file  Social History Narrative  . Not on file   Social Determinants of Health   Financial Resource Strain: Not on file  Food Insecurity: Not on file  Transportation Needs: Not on file  Physical Activity: Not on file  Stress: Not on file  Social Connections: Not on file  Intimate Partner Violence: Not on file   PHYSICAL EXAM  Vitals:   03/23/21 1004  BP: 119/74  Pulse: 74  Weight: 162 lb (73.5 kg)  Height: 5' 3.5" (1.613 m)   Body mass index is 28.25 kg/m.  Generalized: Well developed, in no acute distress   Neurological examination  Mentation: Alert oriented to time, place, history taking. Follows all commands speech and language fluent Cranial nerve II-XII: Pupils were equal round reactive to light. Extraocular movements were full, visual field were full on confrontational test. Facial sensation and strength were normal.  Head turning and  shoulder shrug  were normal and symmetric. Motor: The motor testing reveals 5 over 5 strength of all 4 extremities. Good symmetric motor tone is noted throughout.  Sensory: Sensory testing is intact to soft touch on all 4 extremities. No evidence of extinction is noted.  Coordination: Cerebellar testing reveals good finger-nose-finger and heel-to-shin bilaterally. Mild tremor with finger-nose-finger bilaterally. Gait and station: Gait is slightly wide-based. Tandem gait is slightly unsteady. Romberg is negative. No drift is seen.  Reflexes: Deep tendon reflexes are symmetric and normal bilaterally.   DIAGNOSTIC DATA (LABS, IMAGING, TESTING) - I reviewed patient records, labs, notes, testing and imaging myself where available.  Lab Results  Component Value Date   WBC 3.8 (L) 03/21/2021   HGB 14.3 03/21/2021   HCT 41.9 03/21/2021   MCV 97.5 03/21/2021   PLT 210.0 03/21/2021      Component Value Date/Time   NA 142 03/21/2021 0912   NA 141 11/10/2020 0000   K 3.5 03/21/2021 0912   CL 106 03/21/2021 0912   CO2 25 03/21/2021 0912   GLUCOSE 105 (H) 03/21/2021 0912   BUN 25 (H) 03/21/2021 0912   CREATININE 0.83 03/21/2021 0912   CALCIUM 9.1 03/21/2021 0912   PROT 6.5 03/21/2021 0912   ALBUMIN 4.2 03/21/2021 0912   AST 12 03/21/2021 0912   ALT 10 03/21/2021 0912   ALKPHOS 83 03/21/2021 0912   BILITOT 0.6 03/21/2021 0912   GFRNONAA 83 11/10/2020 0000   GFRAA >60 08/02/2020 0744   Lab Results  Component Value Date   CHOL 156 03/21/2021   HDL 66.50 03/21/2021   LDLCALC 64 03/21/2021   TRIG 127.0 03/21/2021   CHOLHDL 2 03/21/2021   Lab Results  Component Value Date   HGBA1C 5.6 03/21/2021   No results found for: [id number] Lab Results  Component Value Date   TSH 5.85 (H) 03/21/2021   ASSESSMENT AND PLAN 75 y.o. year old female  has a past medical history of Anxiety, Diabetes mellitus without complication ([place]), Headache syndrome (12/18/2016), Heart murmur (03/21/2021), High  blood pressure, and Hypertension. here with:  1.  Postop subdural hematoma, with craniotomy August 2021, presented with symptoms of gait abnormality 2.  Chronic headache  [place]acelie has recovered quite well, following large subacute subdural hematomas bilaterally in August 2021 with craniotomy.  She has graduated from seeing Dr. Tessa Lerner.  Feels she is essentially back to baseline, does have some mild balance issues.  No longer complains of headache. Will reduce dose of Topamax 50 mg at bedtime.  She will call for any problems or concerns.  I  would like for her to see Dr. Jannifer Franklin in the next 5 to 6 months.  I spent 34 minutes of face-to-face and non-face-to-face time with patient.  This included previsit chart review, lab review, study review, discussing hospital admission, recovery, educating on Topamax reduction and potential side effects.  Butler Denmark, AGNP-C, DNP 03/23/2021, 10:57 AM [facility] [address] [place], [place] [address] [phone]

## 2021-03-23 NOTE — Patient Instructions (Signed)
Cut back Topamax 50 mg at bedtime  Let me know of any adverse effects on lower dose See you back in 5-6 months with Dr. Jannifer Franklin

## 2021-03-23 NOTE — Progress Notes (Signed)
I have read the note, and I agree with the clinical assessment and plan.  Veron Senner K Auden Tatar   

## 2021-03-25 ENCOUNTER — Other Ambulatory Visit: Payer: Self-pay

## 2021-03-25 ENCOUNTER — Telehealth: Payer: Self-pay

## 2021-03-25 DIAGNOSIS — R7989 Other specified abnormal findings of blood chemistry: Secondary | ICD-10-CM | POA: Diagnosis not present

## 2021-03-25 MED ORDER — ATORVASTATIN CALCIUM 10 MG PO TABS: 10.0000 mg | ORAL_TABLET | Freq: Every day | ORAL | 3 refills | Status: DC

## 2021-03-25 NOTE — Telephone Encounter (Signed)
Spoke with patient about labs, let her know we are waiting for another result to come back then we will result labs out at one time.

## 2021-03-25 NOTE — Telephone Encounter (Signed)
  LAST APPOINTMENT DATE: 03/21/2021   NEXT APPOINTMENT DATE:@Visit  date not found  MEDICATION:atorvastatin (LIPITOR) 10 MG tablet  [place], Cut Off  Comments: Patient is requesting refill but is also is also asking if someone can call her about lab results.    Please advise

## 2021-03-26 LAB — T3: T3, Total: 124 ng/dL (ref 76–181)

## 2021-03-28 ENCOUNTER — Telehealth: Payer: Self-pay

## 2021-03-28 NOTE — Telephone Encounter (Signed)
Patient is states she was returning a call from [place], but says she is in meetings all day that she will not be able to speak with anyone at all and that it will be better to contact her tomorrow.

## 2021-03-29 ENCOUNTER — Other Ambulatory Visit: Payer: Self-pay

## 2021-03-29 DIAGNOSIS — E038 Other specified hypothyroidism: Secondary | ICD-10-CM

## 2021-03-29 NOTE — Telephone Encounter (Signed)
Patient calling back to speak to [facility].

## 2021-03-29 NOTE — Telephone Encounter (Signed)
Patient is requesting lab results. Will cancel free t4 since it has been too long to add.

## 2021-03-29 NOTE — Telephone Encounter (Signed)
Please have her check her mychart account or call her and give her those results. Thanks.

## 2021-03-29 NOTE — Telephone Encounter (Signed)
Spoke with patient regarding labs, gave a verbal understanding °

## 2021-03-30 ENCOUNTER — Telehealth: Payer: Self-pay

## 2021-03-30 NOTE — Telephone Encounter (Signed)
Called pt and LVM to schedule appt

## 2021-03-30 NOTE — Telephone Encounter (Signed)
Needs office visit to assess rash.

## 2021-03-30 NOTE — Telephone Encounter (Signed)
Please advise 

## 2021-03-30 NOTE — Telephone Encounter (Signed)
Pt called stating she thinks she may have an infection under her right breast. Pt is experiencing redness, pain, and blister like lesions under her breast. Pt has been using fluorouracil cream 5% but it has not worked. Pt asked if Dr. Jonni Sanger could send something in for her or provide guidance on OTC medication she could take. Please advise.

## 2021-03-31 NOTE — Telephone Encounter (Signed)
Patient is returning a call from Amanda Collier, Usery states she is heading out of town today and tomorrow for all day meetings. No appointment available anywhere for Monday, can we use same day on Monday for patient or Tuesday.

## 2021-03-31 NOTE — Telephone Encounter (Signed)
Yes, that is fine. 

## 2021-03-31 NOTE — Telephone Encounter (Signed)
Patient is scheduled   

## 2021-04-04 ENCOUNTER — Encounter: Payer: Self-pay | Admitting: Family Medicine

## 2021-04-04 ENCOUNTER — Ambulatory Visit (INDEPENDENT_AMBULATORY_CARE_PROVIDER_SITE_OTHER): Payer: Medicare Other | Admitting: Family Medicine

## 2021-04-04 ENCOUNTER — Other Ambulatory Visit: Payer: Self-pay

## 2021-04-04 VITALS — BP 130/80 | HR 83 | Temp 97.2°F | Ht 64.0 in | Wt 164.4 lb

## 2021-04-04 DIAGNOSIS — R234 Changes in skin texture: Secondary | ICD-10-CM | POA: Diagnosis not present

## 2021-04-04 DIAGNOSIS — L304 Erythema intertrigo: Secondary | ICD-10-CM | POA: Diagnosis not present

## 2021-04-04 NOTE — Progress Notes (Signed)
Subjective  CC:  Chief Complaint  Patient presents with  . Rash    Under right breast, on going for about 2 weeks. Has used OTC     HPI: Amanda Collier is a 75 y.o. female who presents to the office today to address the problems listed above in the chief complaint.  As above had red Rash beneath right breast for about 2 weeks.  Use over-the-counter antifungals and rashes disappeared.  Still with sore area present.  Assessment  1. Intertrigo   2. Fissure in skin      Plan   Intertrigo with fissure: Infection resolved.  Discussed future management and preventive measures.  Follow up: As needed Visit date not found  No orders of the defined types were placed in this encounter.  No orders of the defined types were placed in this encounter.     I reviewed the patients updated PMH, FH, and SocHx.    Patient Active Problem List   Diagnosis Date Noted  . Heart murmur 03/21/2021  . Hx of traumatic brain injury 03/09/2021  . Mixed hyperlipidemia 12/26/2020  . 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 Personal history of colonic polyps 12/24/2020  . Pure hypercholesterolemia 12/24/2020  . Prediabetes 12/24/2020  . Right-sided sensorineural hearing loss 09/29/2020  . Vascular headache   . Neurogenic bladder   . 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 Alcohol abuse   . Spinal stenosis in cervical region   . Status post craniotomy 06/24/2020  . Headache syndrome 12/18/2016  . Essential hypertension 12/17/2015   Current Meds  Medication Sig  . amoxicillin (AMOXIL) 500 MG capsule Take 500 mg by mouth 3 (three) times daily.  Marland Kitchen aspirin 81 MG EC tablet Take 81 mg by mouth in the morning and at bedtime. Swallow whole.  Marland Kitchen atorvastatin (LIPITOR) 10 MG tablet Take 1 tablet (10 mg total) by mouth daily.  . divalproex (DEPAKOTE ER) 250 MG 24 hr tablet Take 1-3 tablets (250-750 mg total) by mouth See admin instructions. Take 250  mg by mouth in the morning and 750 mg at bedtime  . losartan (COZAAR) 25 MG tablet Take 1 tablet (25 mg total) by mouth daily.  Marland Kitchen topiramate (TOPAMAX) 50 MG tablet Take 1 tablet (50 mg total) by mouth at bedtime.  . Vitamin D, Ergocalciferol, (DRISDOL) 1.25 MG (50000 UNIT) CAPS capsule Take 1 capsule (50,000 Units total) by mouth every Wednesday.  . zolpidem (AMBIEN) 10 MG tablet Take 10 mg by mouth at bedtime as needed.    Allergies: Patient is allergic to codeine, lexapro [escitalopram oxalate], lisinopril, and lotensin [benazepril hcl]. Family History: Patient family history includes Heart attack in her maternal uncle. She was adopted. Social History:  Patient  reports that she has never smoked. She has never used smokeless tobacco. She reports current alcohol use of about 7.0 standard drinks of alcohol per week. She reports that she does not use drugs.  Review of Systems: Constitutional: Negative for fever malaise or anorexia Cardiovascular: negative for chest pain Respiratory: negative for SOB or persistent cough Gastrointestinal: negative for abdominal pain  Objective  Vitals: BP 130/80   Pulse 83   Temp (!) 97.2 F (36.2 C) (Temporal)   Ht 5\' 4"  (1.626 m)   Wt 164 lb 6.4 oz (74.6 kg)   LMP 11/14/2003   SpO2 98%   BMI 28.22 kg/m  General: no acute distress , A&Ox3 Skin: Beneath  right breast at fold there is a 3 inch fissure without rash    Commons side effects, risks, benefits, and alternatives for medications and treatment plan prescribed today were discussed, and the patient expressed understanding of the given instructions. Patient is instructed to call or message via MyChart if he/she has any questions or concerns regarding our treatment plan. No barriers to understanding were identified. We discussed Red Flag symptoms and signs in detail. Patient expressed understanding regarding what to do in case of urgent or emergency type symptoms.   Medication list was  reconciled, printed and provided to the patient in AVS. Patient instructions and summary information was reviewed with the patient as documented in the AVS. This note was prepared with assistance of Dragon voice recognition software. Occasional wrong-word or sound-a-like substitutions may have occurred due to the inherent limitations of voice recognition software  This visit occurred during the SARS-CoV-2 public health emergency.  Safety protocols were in place, including screening questions prior to the visit, additional usage of staff PPE, and extensive cleaning of exam room while observing appropriate contact time as indicated for disinfecting solutions.

## 2021-04-13 DIAGNOSIS — Z23 Encounter for immunization: Secondary | ICD-10-CM | POA: Diagnosis not present

## 2021-04-14 DIAGNOSIS — Z1231 Encounter for screening mammogram for malignant neoplasm of breast: Secondary | ICD-10-CM | POA: Diagnosis not present

## 2021-04-14 LAB — HM MAMMOGRAPHY

## 2021-04-18 ENCOUNTER — Encounter: Payer: Self-pay | Admitting: Family Medicine

## 2021-04-21 ENCOUNTER — Telehealth (HOSPITAL_BASED_OUTPATIENT_CLINIC_OR_DEPARTMENT_OTHER): Payer: Self-pay

## 2021-04-21 NOTE — Telephone Encounter (Signed)
Patient called today in response to a call she thought was made from our office. She said the referral was suppose to be for "bladder leakage". She wanted to let Dr. Sabra Heck know that she will be unable to do any appointment (physical therapy or uro/gyno) right now. She [place] beach. She will call both places when she returns. She did say that she got [place] mamm[place] the beach and that office should be sending [place] the results. tbw

## 2021-04-29 ENCOUNTER — Encounter: Payer: Self-pay | Admitting: Family Medicine

## 2021-04-29 ENCOUNTER — Telehealth (HOSPITAL_BASED_OUTPATIENT_CLINIC_OR_DEPARTMENT_OTHER): Payer: Self-pay

## 2021-04-29 NOTE — Telephone Encounter (Signed)
Amanda Collier is a 75 y.o. female called in to [place] wanting to schedule her consult with Dr.Schroeder. No referral was found. I spoke to Dr. Sabra Heck and she said she will enter the referral. Pt ill be called back when the referral is entered. Pt said she will be available to be seen the week of September 12th anytime. She asked if we call between 6/22 and Sept 11 to schedule please do not call before 10am because she will be on the [employer].

## 2021-05-01 ENCOUNTER — Other Ambulatory Visit (HOSPITAL_BASED_OUTPATIENT_CLINIC_OR_DEPARTMENT_OTHER): Payer: Self-pay | Admitting: Obstetrics & Gynecology

## 2021-05-01 DIAGNOSIS — R32 Unspecified urinary incontinence: Secondary | ICD-10-CM

## 2021-05-02 ENCOUNTER — Telehealth (HOSPITAL_BASED_OUTPATIENT_CLINIC_OR_DEPARTMENT_OTHER): Payer: Self-pay

## 2021-05-02 NOTE — Telephone Encounter (Signed)
Pt has been scheduled for 07/25/2021 @10 :40am with Dr. Wannetta Sender for a new patient consult

## 2021-05-02 NOTE — Telephone Encounter (Signed)
Attempt made to contact Amanda Collier is a 75 y.o. female  re: a message to call her back re: upcoming appt with Dr. Wannetta Sender that pt may want to change. Pt was not available.  LM on the VM for the patient to call me back.

## 2021-05-06 ENCOUNTER — Encounter (HOSPITAL_BASED_OUTPATIENT_CLINIC_OR_DEPARTMENT_OTHER): Payer: Self-pay | Admitting: Obstetrics & Gynecology

## 2021-05-18 DIAGNOSIS — Z20822 Contact with and (suspected) exposure to covid-19: Secondary | ICD-10-CM | POA: Diagnosis not present

## 2021-06-23 DIAGNOSIS — D225 Melanocytic nevi of trunk: Secondary | ICD-10-CM | POA: Diagnosis not present

## 2021-06-23 DIAGNOSIS — L814 Other melanin hyperpigmentation: Secondary | ICD-10-CM | POA: Diagnosis not present

## 2021-06-23 DIAGNOSIS — L578 Other skin changes due to chronic exposure to nonionizing radiation: Secondary | ICD-10-CM | POA: Diagnosis not present

## 2021-06-23 DIAGNOSIS — L821 Other seborrheic keratosis: Secondary | ICD-10-CM | POA: Diagnosis not present

## 2021-06-23 DIAGNOSIS — L570 Actinic keratosis: Secondary | ICD-10-CM | POA: Diagnosis not present

## 2021-07-14 DIAGNOSIS — G459 Transient cerebral ischemic attack, unspecified: Secondary | ICD-10-CM

## 2021-07-14 HISTORY — DX: Transient cerebral ischemic attack, unspecified: G45.9

## 2021-07-25 ENCOUNTER — Other Ambulatory Visit: Payer: Self-pay

## 2021-07-25 ENCOUNTER — Encounter: Payer: Self-pay | Admitting: Obstetrics and Gynecology

## 2021-07-25 ENCOUNTER — Ambulatory Visit (INDEPENDENT_AMBULATORY_CARE_PROVIDER_SITE_OTHER): Payer: Medicare Other | Admitting: Obstetrics and Gynecology

## 2021-07-25 VITALS — BP 122/78 | HR 71 | Ht 63.0 in | Wt 178.0 lb

## 2021-07-25 DIAGNOSIS — N3281 Overactive bladder: Secondary | ICD-10-CM

## 2021-07-25 DIAGNOSIS — R350 Frequency of micturition: Secondary | ICD-10-CM | POA: Diagnosis not present

## 2021-07-25 DIAGNOSIS — N8110 Cystocele, unspecified: Secondary | ICD-10-CM | POA: Diagnosis not present

## 2021-07-25 LAB — POCT URINALYSIS DIPSTICK
Appearance: ABNORMAL
Bilirubin, UA: NEGATIVE
Blood, UA: NEGATIVE
Glucose, UA: NEGATIVE
Leukocytes, UA: NEGATIVE
Nitrite, UA: NEGATIVE
Protein, UA: NEGATIVE
Spec Grav, UA: >=1.03 — AB (ref 1.010–1.025)
Urobilinogen, UA: 0.2 E.U./dL
pH, UA: 5 (ref 5.0–8.0)

## 2021-07-25 NOTE — Patient Instructions (Signed)

## 2021-07-25 NOTE — Progress Notes (Signed)
[facility] New Patient Evaluation and Consultation  Referring Provider: Megan Salon, MD PCP: Leamon Arnt, MD Date of Service: 07/25/2021  SUBJECTIVE Chief Complaint: New Patient (Initial Visit)  History of Present Illness: Amanda Collier is a 75 y.o. White or Caucasian female seen in consultation at the request of Dr. Sabra Heck for evaluation of urinary incontinence.    Review of records significant for: Has leakage and frequent urination in the morning after caffeine. Denies leakage with cough and sneeze. Had a traumatic subdural hematoma.   Urinary Symptoms: Leaks urine with going from sitting to standing, with a full bladder, and with urgency Leaks 2 time(s) per day.  Pad use: 4- 6 liners/ mini-pads per day- now is having double panty liner.  She is bothered by her UI symptoms.  Day time voids 5-6.  Nocturia: 0 times per night to void. Voiding dysfunction: she empties her bladder well.  does not use a catheter to empty bladder.  When urinating, she feels she has no difficulties Drinks: black tea with lemon in AM, occasional coffee, a couple 8oz glasses water during the day, glass wine with dinner per day  UTIs:  0  UTI's in the last year.   Denies history of blood in urine and kidney or bladder stones  Pelvic Organ Prolapse Symptoms:                  She Denies a feeling of a bulge the vaginal area.   Bowel Symptom: Bowel movements: 2 time(s) per day Stool consistency: soft  Straining: no.  Splinting: no.  Incomplete evacuation: no.  She Denies accidental bowel leakage / fecal incontinence Bowel regimen: diet and fiber  Sexual Function Sexually active: yes.  Sexual orientation: Straight Pain with sex: No  Pelvic Pain Denies pelvic pain   Past Medical History:  Past Medical History:  Diagnosis Date   Anxiety    Diabetes mellitus without complication ([place])    Headache syndrome 12/18/2016   Heart murmur 03/21/2021   Last echo 2018; calcified  aortic valve w/o regurg or stenosis. Nl EF.    High blood pressure    Hypertension    followed by Dr Gillian Shields medications     Past Surgical History:   Past Surgical History:  Procedure Laterality Date   CRANIOTOMY Bilateral 06/24/2020   Procedure: Bilateral craniotomy for evacuation of subdural hematoma;  Surgeon: Ashok Pall, MD;  Location: [facility];  Service: Neurosurgery;  Laterality: Bilateral;   CRANIOTOMY Right 06/26/2020   Procedure: CRANIOTOMY HEMATOMA EVACUATION SUBDURAL;  Surgeon: Ashok Pall, MD;  Location: [place];  Service: Neurosurgery;  Laterality: Right;     Past OB/GYN History: OB History  Gravida Para Term Preterm AB Living  '2 2       2  '$ SAB IAB Ectopic Multiple Live Births          2    # Outcome Date GA Lbr Len/2nd Weight Sex Delivery Anes PTL Lv  2 Para           1 Para             Vaginal deliveries: 2,  Forceps/ Vacuum deliveries: 0, Cesarean section: 0 Menopausal: Yes, at age 46, Denies vaginal bleeding since menopause   Medications: She has a current medication list which includes the following prescription(s): aspirin, atorvastatin, divalproex, losartan, topiramate, vitamin d (ergocalciferol), and zolpidem.   Allergies: Patient is allergic to codeine, lexapro [escitalopram oxalate], lisinopril, and lotensin [benazepril hcl].  Social History:  Social History   Tobacco Use   Smoking status: Never   Smokeless tobacco: Never  Vaping Use   Vaping Use: Never used  Substance Use Topics   Alcohol use: Yes    Alcohol/week: 7.0 standard drinks    Types: 7 Standard drinks or equivalent per week   Drug use: No    Relationship status: married She lives with husband.   She is not employed. Regular exercise: No- but signed up for [employer].  History of abuse: No  Family History:   Family History  Adopted: Yes  Problem Relation Age of Onset   Heart attack Maternal Uncle        ? unknown if correct/patient adopted     Review of  Systems: Review of Systems  Constitutional:  Negative for fever, malaise/fatigue and weight loss.       + weight gain  Respiratory:  Negative for cough, shortness of breath and wheezing.   Cardiovascular:  Negative for chest pain, palpitations and leg swelling.  Gastrointestinal:  Negative for abdominal pain and blood in stool.  Genitourinary:  Negative for dysuria.  Musculoskeletal:  Negative for myalgias.  Skin:  Negative for rash.  Neurological:  Negative for dizziness and headaches.  Endo/Heme/Allergies:  Does not bruise/bleed easily.  Psychiatric/Behavioral:  Negative for depression. The patient is not nervous/anxious.     OBJECTIVE Physical Exam: Vitals:   07/25/21 1054  BP: 122/78  Pulse: 71  Weight: 178 lb (80.7 kg)  Height: '5\' 3"'$  (1.6 m)    Physical Exam Constitutional:      General: She is not in acute distress. Pulmonary:     Effort: Pulmonary effort is normal.  Abdominal:     General: There is no distension.     Palpations: Abdomen is soft.     Tenderness: There is no abdominal tenderness. There is no rebound.  Musculoskeletal:        General: No swelling. Normal range of motion.  Skin:    General: Skin is warm and dry.     Findings: No rash.  Neurological:     Mental Status: She is alert and oriented to person, place, and time.  Psychiatric:        Mood and Affect: Mood normal.        Behavior: Behavior normal.     GU / Detailed Urogynecologic Evaluation:  Pelvic Exam: Normal external female genitalia; Bartholin's and Skene's glands normal in appearance; urethral meatus normal in appearance, no urethral masses or discharge.   CST: negative  Speculum exam reveals normal vaginal mucosa without atrophy. Cervix normal appearance. Uterus normal single, nontender. Adnexa normal adnexa.     Pelvic floor strength I/V  Pelvic floor musculature: Right levator non-tender, Right obturator non-tender, Left levator non-tender, Left obturator non-tender  POP-Q:    POP-Q  -1                                            Aa   -1                                           Ba  -6  C   3                                            Gh  3                                            Pb  9                                            tvl   -3                                            Ap  -3                                            Bp  -9                                              D     Rectal Exam:  Normal external rectum  Post-Void Residual (PVR) by Bladder Scan: In order to evaluate bladder emptying, we discussed obtaining a postvoid residual and she agreed to this procedure.  Procedure: The ultrasound unit was placed on the patient's abdomen in the suprapubic region after the patient had voided. A PVR of 17 ml was obtained by bladder scan.  Laboratory Results: POC urine: negative   ASSESSMENT AND PLAN Ms. Uhle is a 75 y.o. with:  1. Overactive bladder   2. Urinary frequency   3. Prolapse of anterior vaginal wall     OAB -We discussed the symptoms of overactive bladder (OAB), which include urinary urgency, urinary frequency, nocturia, with or without urge incontinence.  While we do not know the exact etiology of OAB, several treatment options exist. We discussed management including behavioral therapy (decreasing bladder irritants, urge suppression strategies, timed voids, bladder retraining), physical therapy, medication.  - She is interested in pelvic PT- referral placed. She will also work on reducing bladder irritants, list provided  2. Stage II anterior, Stage I posterior, Stage I apical prolapse - she is asymptomatic from her prolapse so will expectantly manage  Return 3 months for follow up  Jaquita Folds, MD   Medical Decision Making:  - Reviewed/ ordered a clinical laboratory test - Review and summation of prior records

## 2021-07-28 ENCOUNTER — Encounter (HOSPITAL_BASED_OUTPATIENT_CLINIC_OR_DEPARTMENT_OTHER): Payer: Self-pay | Admitting: Obstetrics and Gynecology

## 2021-07-28 ENCOUNTER — Other Ambulatory Visit: Payer: Self-pay

## 2021-07-28 ENCOUNTER — Emergency Department (HOSPITAL_BASED_OUTPATIENT_CLINIC_OR_DEPARTMENT_OTHER): Payer: Medicare Other

## 2021-07-28 ENCOUNTER — Observation Stay (HOSPITAL_BASED_OUTPATIENT_CLINIC_OR_DEPARTMENT_OTHER)
Admission: EM | Admit: 2021-07-28 | Discharge: 2021-07-29 | Disposition: A | Discharge status: Home / Self Care | Payer: Medicare Other | Attending: Internal Medicine | Admitting: Internal Medicine

## 2021-07-28 DIAGNOSIS — Z20822 Contact with and (suspected) exposure to covid-19: Secondary | ICD-10-CM | POA: Insufficient documentation

## 2021-07-28 DIAGNOSIS — I6523 Occlusion and stenosis of bilateral carotid arteries: Secondary | ICD-10-CM | POA: Diagnosis not present

## 2021-07-28 DIAGNOSIS — I82409 Acute embolism and thrombosis of unspecified deep veins of unspecified lower extremity: Secondary | ICD-10-CM | POA: Diagnosis not present

## 2021-07-28 DIAGNOSIS — F1010 Alcohol abuse, uncomplicated: Secondary | ICD-10-CM | POA: Diagnosis present

## 2021-07-28 DIAGNOSIS — F3110 Bipolar disorder, current episode manic without psychotic features, unspecified: Secondary | ICD-10-CM | POA: Diagnosis present

## 2021-07-28 DIAGNOSIS — Z79899 Other long term (current) drug therapy: Secondary | ICD-10-CM | POA: Insufficient documentation

## 2021-07-28 DIAGNOSIS — Y900 Blood alcohol level of less than 20 mg/100 ml: Secondary | ICD-10-CM | POA: Diagnosis not present

## 2021-07-28 DIAGNOSIS — R29818 Other symptoms and signs involving the nervous system: Secondary | ICD-10-CM | POA: Diagnosis not present

## 2021-07-28 DIAGNOSIS — R42 Dizziness and giddiness: Principal | ICD-10-CM | POA: Insufficient documentation

## 2021-07-28 DIAGNOSIS — E119 Type 2 diabetes mellitus without complications: Secondary | ICD-10-CM | POA: Diagnosis not present

## 2021-07-28 DIAGNOSIS — Z7982 Long term (current) use of aspirin: Secondary | ICD-10-CM | POA: Insufficient documentation

## 2021-07-28 DIAGNOSIS — E782 Mixed hyperlipidemia: Secondary | ICD-10-CM | POA: Diagnosis present

## 2021-07-28 DIAGNOSIS — E1169 Type 2 diabetes mellitus with other specified complication: Secondary | ICD-10-CM | POA: Diagnosis present

## 2021-07-28 DIAGNOSIS — Z789 Other specified health status: Secondary | ICD-10-CM | POA: Diagnosis present

## 2021-07-28 DIAGNOSIS — I10 Essential (primary) hypertension: Secondary | ICD-10-CM | POA: Insufficient documentation

## 2021-07-28 LAB — DIFFERENTIAL
Abs Immature Granulocytes: 0.02 10*3/uL (ref 0.00–0.07)
Basophils Absolute: 0 10*3/uL (ref 0.0–0.1)
Basophils Relative: 0 %
Eosinophils Absolute: 0.1 10*3/uL (ref 0.0–0.5)
Eosinophils Relative: 2 %
Immature Granulocytes: 1 %
Lymphocytes Relative: 40 %
Lymphs Abs: 1.4 10*3/uL (ref 0.7–4.0)
Monocytes Absolute: 0.3 10*3/uL (ref 0.1–1.0)
Monocytes Relative: 10 %
Neutro Abs: 1.7 10*3/uL (ref 1.7–7.7)
Neutrophils Relative %: 47 %

## 2021-07-28 LAB — COMPREHENSIVE METABOLIC PANEL
ALT: 11 U/L (ref 0–44)
AST: 12 U/L — ABNORMAL LOW (ref 15–41)
Albumin: 4.4 g/dL (ref 3.5–5.0)
Alkaline Phosphatase: 78 U/L (ref 38–126)
Anion gap: 11 (ref 5–15)
BUN: 25 mg/dL — ABNORMAL HIGH (ref 8–23)
CO2: 25 mmol/L (ref 22–32)
Calcium: 9.5 mg/dL (ref 8.9–10.3)
Chloride: 103 mmol/L (ref 98–111)
Creatinine, Ser: 0.89 mg/dL (ref 0.44–1.00)
GFR, Estimated: >60 mL/min (ref >60)
Glucose, Bld: 147 mg/dL — ABNORMAL HIGH (ref 70–99)
Potassium: 3.5 mmol/L (ref 3.5–5.1)
Sodium: 139 mmol/L (ref 135–145)
Total Bilirubin: 0.5 mg/dL (ref 0.3–1.2)
Total Protein: 6.9 g/dL (ref 6.5–8.1)

## 2021-07-28 LAB — CBC
HCT: 41.8 % (ref 36.0–46.0)
Hemoglobin: 14.1 g/dL (ref 12.0–15.0)
MCH: 32.5 pg (ref 26.0–34.0)
MCHC: 33.7 g/dL (ref 30.0–36.0)
MCV: 96.3 fL (ref 80.0–100.0)
Platelets: 194 10*3/uL (ref 150–400)
RBC: 4.34 MIL/uL (ref 3.87–5.11)
RDW: 13 % (ref 11.5–15.5)
WBC: 3.6 10*3/uL — ABNORMAL LOW (ref 4.0–10.5)
nRBC: 0 % (ref 0.0–0.2)

## 2021-07-28 LAB — PROTIME-INR
INR: 0.9 (ref 0.8–1.2)
Prothrombin Time: 12.1 seconds (ref 11.4–15.2)

## 2021-07-28 LAB — CBG MONITORING, ED: Glucose-Capillary: 143 mg/dL — ABNORMAL HIGH (ref 70–99)

## 2021-07-28 LAB — ETHANOL: Alcohol, Ethyl (B): 13 mg/dL — ABNORMAL HIGH (ref <10)

## 2021-07-28 LAB — URINALYSIS, ROUTINE W REFLEX MICROSCOPIC
Bilirubin Urine: NEGATIVE
Glucose, UA: NEGATIVE mg/dL
Hgb urine dipstick: NEGATIVE
Ketones, ur: NEGATIVE mg/dL
Leukocytes,Ua: NEGATIVE
Nitrite: NEGATIVE
Protein, ur: NEGATIVE mg/dL
Specific Gravity, Urine: 1.009 (ref 1.005–1.030)
pH: 6.5 (ref 5.0–8.0)

## 2021-07-28 LAB — RESP PANEL BY RT-PCR (FLU A&B, COVID) ARPGX2
Influenza A by PCR: NEGATIVE
Influenza B by PCR: NEGATIVE
SARS Coronavirus 2 by RT PCR: NEGATIVE

## 2021-07-28 LAB — APTT: aPTT: 29 seconds (ref 24–36)

## 2021-07-28 LAB — RAPID URINE DRUG SCREEN, HOSP PERFORMED
Amphetamines: NOT DETECTED
Barbiturates: NOT DETECTED
Benzodiazepines: NOT DETECTED
Cocaine: NOT DETECTED
Opiates: NOT DETECTED
Tetrahydrocannabinol: NOT DETECTED

## 2021-07-28 MED ORDER — ASPIRIN 81 MG PO TBEC: 81.0000 mg | DELAYED_RELEASE_TABLET | Freq: Every day | ORAL | Status: DC

## 2021-07-28 MED ORDER — DIVALPROEX SODIUM ER 250 MG PO TB24: 250.0000 mg | ORAL_TABLET | ORAL | Status: DC

## 2021-07-28 MED ORDER — MECLIZINE HCL 12.5 MG PO TABS: 25.0000 mg | ORAL_TABLET | Freq: Once | ORAL | Status: DC

## 2021-07-28 MED ORDER — IOHEXOL 350 MG/ML SOLN
75.0000 mL | Freq: Once | INTRAVENOUS | Status: AC | PRN
  Administered 2021-07-28: 75 mL via INTRAVENOUS

## 2021-07-28 MED ORDER — ATORVASTATIN CALCIUM 10 MG PO TABS
10.0000 mg | ORAL_TABLET | Freq: Every day | ORAL | Status: DC
  Administered 2021-07-29: 10 mg via ORAL
  Filled 2021-07-28: qty 1

## 2021-07-28 NOTE — ED Notes (Signed)
Pt ambulated to bathroom, pt dizzy while walking. Transported back to room via stretcher.

## 2021-07-28 NOTE — ED Notes (Signed)
r 

## 2021-07-28 NOTE — Progress Notes (Signed)
Received a phone call from Facility: [facility]  Requesting MD: Dr. Billy Fischer Patient with h/o HTN, HLD, prediabetes, bipolar 1, bilateral subdural hematomas in 8/21, ? Hx of alcohol abuse presenting with transient dizziness.CTH negative for acute finding, CTA pending. Ethanol at 13 otherwise labs stable. Vitals stable. Code stroke initiated for ataxia on exam and neurology consulted. Meclizine given. Recommended admit to [facility] for stroke w/u.   Plan of care: stroke w/u  The patient will be accepted for admission to telemetry at [facility] when bed is available.  Dr. Leonie Man has been consulted with neurology. Please contact when arrives.    Nursing staff, Please call the [place] number at the top of Amion at the time of the patient's arrival so that the patient can be paged to the admitting physician.   Casimer Bilis, M.D. [facility]

## 2021-07-28 NOTE — ED Notes (Signed)
Pt has been transferred to [facility] at 3W via [facility]  - stable

## 2021-07-28 NOTE — ED Notes (Signed)
Swallow test passed  - had her dinner without difficulty. Pt is alert and oriented

## 2021-07-28 NOTE — ED Notes (Signed)
Pt transported to bathroom via wheel chair.

## 2021-07-28 NOTE — ED Notes (Signed)
Called  [facility] to transport patient to [facility] 3W room 14

## 2021-07-28 NOTE — ED Notes (Signed)
Report given to [facility] and Dionne Bucy RN at [place], [place]

## 2021-07-28 NOTE — ED Triage Notes (Signed)
Patient reports she was at a church convention and started having dizziness at 1250. Patient states she has a hx of subdural hematoma. Patient reports she has a hx of vertigo as well.

## 2021-07-28 NOTE — ED Provider Notes (Signed)
[facility] EMERGENCY DEPT Provider Note   CSN: [id number] Arrival date & time: 07/28/21  1344     History Chief Complaint  Patient presents with   Dizziness    Amanda Collier is a 75 y.o. female.  HPI     75 year old female with a history of diabetes, hypertension, history of chronic DVT, craniotomy and August 2021 for evacuation of subdural hematoma who presents with concern for acute onset of vertigo and balance difficulties.  She was at a church conference today when she felt acute onset of vertigo and difficulty walking. Has some unsteady gait since her prior surgery but had acute onset/worsening today.  Her husband had to help her walk out.  Denies numbness, weakness, difficulty talking or visual changes or facial droop.  Constant symptoms and continuing now. Occurred at 150PM.   Past Medical History:  Diagnosis Date   Anxiety    Diabetes mellitus without complication ([place])    Headache syndrome 12/18/2016   Heart murmur 03/21/2021   Last echo 2018; calcified aortic valve w/o regurg or stenosis. Nl EF.    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 ([place])    Alcohol abuse    Spinal stenosis in cervical region    Status post craniotomy 06/24/2020   Headache syndrome 12/18/2016   Essential hypertension 12/17/2015    Past Surgical History:  Procedure Laterality Date   CRANIOTOMY Bilateral 06/24/2020    Procedure: Bilateral craniotomy for evacuation of subdural hematoma;  Surgeon: Ashok Pall, MD;  Location: [facility];  Service: Neurosurgery;  Laterality: Bilateral;   CRANIOTOMY Right 06/26/2020   Procedure: CRANIOTOMY HEMATOMA EVACUATION SUBDURAL;  Surgeon: Ashok Pall, MD;  Location: [place];  Service: Neurosurgery;  Laterality: Right;     OB History     Gravida  2   Para  2   Term      Preterm      AB      Living  2      SAB      IAB      Ectopic      Multiple      Live Births  2           Family History  Adopted: Yes  Problem Relation Age of Onset   Heart attack Maternal Uncle        ? unknown if correct/patient adopted    Social History   Tobacco Use   Smoking status: Never   Smokeless tobacco: Never  Vaping Use   Vaping Use: Never used  Substance Use Topics   Alcohol use: Yes    Alcohol/week: 7.0 standard drinks    Types: 7 Standard drinks or equivalent per week   Drug use: No    Home Medications Prior to Admission medications   Medication Sig Start Date End Date Taking? Authorizing Provider  aspirin 81 MG EC tablet Take 81 mg by mouth in the  morning and at bedtime. Swallow whole.   Yes [provider]  atorvastatin (LIPITOR) 10 MG tablet Take 1 tablet (10 mg total) by mouth daily. 03/25/21  Yes Leamon Arnt, MD  divalproex (DEPAKOTE ER) 250 MG 24 hr tablet Take 1-3 tablets (250-750 mg total) by mouth See admin instructions. Take 250 mg by mouth in the morning and 750 mg at bedtime 11/02/20  Yes Meredith Staggers, MD  losartan (COZAAR) 25 MG tablet Take 1 tablet (25 mg total) by mouth daily. 09/23/20  Yes Meredith Staggers, MD  topiramate (TOPAMAX) 50 MG tablet Take 1 tablet (50 mg total) by mouth at bedtime. 03/23/21  Yes Suzzanne Cloud, NP  Vitamin D, Ergocalciferol, (DRISDOL) 1.25 MG (50000 UNIT) CAPS capsule Take 1 capsule (50,000 Units total) by mouth every Wednesday. 08/04/20  Yes Angiulli, Lavon Paganini, PA-C  zolpidem (AMBIEN) 10 MG  tablet Take 10 mg by mouth at bedtime as needed. 01/12/21   [provider]    Allergies    Codeine, Lexapro [escitalopram oxalate], Lisinopril, and Lotensin [benazepril hcl]  Review of Systems   Review of Systems  Constitutional:  Negative for fever.  HENT:  Negative for sore throat.   Eyes:  Negative for visual disturbance.  Respiratory:  Negative for cough and shortness of breath.   Cardiovascular:  Negative for chest pain.  Gastrointestinal:  Negative for abdominal pain, nausea and vomiting.  Genitourinary:  Negative for difficulty urinating.  Musculoskeletal:  Positive for gait problem. Negative for back pain and neck pain.  Skin:  Negative for rash.  Neurological:  Positive for dizziness. Negative for syncope, facial asymmetry, speech difficulty, weakness, numbness and headaches.   Physical Exam Updated Vital Signs BP 129/67   Pulse 67   Temp 98.5 F (36.9 C)   Resp 17   LMP 11/14/2003   SpO2 98%   Physical Exam Vitals and nursing note reviewed.  Constitutional:      General: She is not in acute distress.    Appearance: Normal appearance. She is well-developed. She is not ill-appearing or diaphoretic.  HENT:     Head: Normocephalic and atraumatic.  Eyes:     General: No visual field deficit.    Extraocular Movements: Extraocular movements intact.     Conjunctiva/sclera: Conjunctivae normal.     Pupils: Pupils are equal, round, and reactive to light.  Cardiovascular:     Rate and Rhythm: Normal rate and regular rhythm.     Pulses: Normal pulses.  Pulmonary:     Effort: Pulmonary effort is normal. No respiratory distress.  Musculoskeletal:        General: No swelling or tenderness.     Cervical back: Normal range of motion.  Skin:    General: Skin is warm and dry.     Findings: No erythema or rash.  Neurological:     General: No focal deficit present.     Mental Status: She is alert and oriented to person, place, and time.     GCS: GCS eye subscore is  4. GCS verbal subscore is 5. GCS motor subscore is 6.     Cranial Nerves: No cranial nerve deficit, dysarthria or facial asymmetry.     Sensory: No sensory deficit.     Motor: No weakness, tremor or pronator drift.     Coordination: Coordination abnormal (tremor, missed finger to nose on right which ipmroved on repeat, tremor bilaterally). Finger-Nose-Finger Test abnormal.     Gait: Gait abnormal.    ED  Results / Procedures / Treatments   Labs (all labs ordered are listed, but only abnormal results are displayed) Labs Reviewed  ETHANOL - Abnormal; Notable for the following components:      Result Value   Alcohol, Ethyl (B) 13 (*)    All other components within normal limits  CBC - Abnormal; Notable for the following components:   WBC 3.6 (*)    All other components within normal limits  COMPREHENSIVE METABOLIC PANEL - Abnormal; Notable for the following components:   Glucose, Bld 147 (*)    BUN 25 (*)    AST 12 (*)    All other components within normal limits  URINALYSIS, ROUTINE W REFLEX MICROSCOPIC - Abnormal; Notable for the following components:   Color, Urine COLORLESS (*)    All other components within normal limits  CBG MONITORING, ED - Abnormal; Notable for the following components:   Glucose-Capillary 143 (*)    All other components within normal limits  RESP PANEL BY RT-PCR (FLU A&B, COVID) ARPGX2  PROTIME-INR  APTT  DIFFERENTIAL  RAPID URINE DRUG SCREEN, HOSP PERFORMED    EKG EKG Interpretation  Date/Time:  Thursday July 28 2021 14:43:54 EDT Ventricular Rate:  69 PR Interval:  163 QRS Duration: 97 QT Interval:  393 QTC Calculation: 421 R Axis:   80 Text Interpretation: Sinus rhythm Low voltage, precordial leads No significant change since last tracing Confirmed by Gareth Morgan [phone]) on 07/28/2021 11:54:23 PM  Radiology CT HEAD CODE STROKE WO CONTRAST  Result Date: 07/28/2021 CLINICAL DATA:  Code stroke.  Vertigo EXAM: CT HEAD WITHOUT CONTRAST  TECHNIQUE: Contiguous axial images were obtained from the base of the skull through the vertex without intravenous contrast. COMPARISON:  CT head 08/20/2020, brain MRI 06/23/2020 FINDINGS: Brain: Small bilateral subdural fluid collections are seen measuring up to 3-4 mm in the coronal plane bilaterally, decreased in size compared to the most recent CT from 08/20/2020. There is no evidence of new or acute intracranial hemorrhage, extra-axial fluid collection, or acute infarct. There is mild parenchymal volume loss and chronic white matter microangiopathy. The ventricles are normal in size. There is no mass lesion. There is no midline shift. Vascular: There is calcification of the bilateral cavernous ICAs. Skull: The patient is status post right parietal craniotomy and left parietal burr hole for evacuation of previously seen subdural hematomas. There is no calvarial fracture or suspicious osseous lesion. Sinuses/Orbits: There is an incompletely imaged mucous retention cyst in the left maxillary sinus. The other paranasal sinuses are clear. The globes and orbits are unremarkable. Other: None. ASPECTS [facility] Stroke Program Early CT Score) - Ganglionic level infarction (caudate, lentiform nuclei, internal capsule, insula, M1-M3 cortex): 7 - Supraganglionic infarction (M4-M6 cortex): 3 Total score (0-10 with 10 being normal): 10 IMPRESSION: 1. No acute intracranial hemorrhage or infarct. 2. ASPECTS is 10 3. 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 These results were called by telephone at the time of interpretation on 07/28/2021 at 2:48 pm to provider Dr Leonie Man, who verbally acknowledged these results. Electronically Signed   By: Valetta Mole M.D.   On: 07/28/2021 14:59   CT ANGIO HEAD NECK W WO CM (CODE STROKE)  Result Date: 07/28/2021 CLINICAL DATA:  Nonspecific dizziness. EXAM: CT ANGIOGRAPHY HEAD AND NECK TECHNIQUE:  Multidetector CT imaging of the head and neck was performed using the standard protocol during bolus administration of intravenous contrast. Multiplanar CT image reconstructions and MIPs  were obtained to evaluate the vascular anatomy. Carotid stenosis measurements (when applicable) are obtained utilizing NASCET criteria, using the distal internal carotid diameter as the denominator. CONTRAST:  59m OMNIPAQUE IOHEXOL 350 MG/ML SOLN COMPARISON:  Head CT earlier same day FINDINGS: CTA NECK FINDINGS Aortic arch: Aortic atherosclerosis. Right carotid system: Common carotid artery widely patent to the bifurcation. Ordinary atherosclerotic plaque at the carotid bifurcation. 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 ICA widely patent beyond that. Left carotid system: Common carotid artery widely patent to the bifurcation. Atherosclerotic calcification at the carotid bifurcation but no stenosis. Vertebral arteries: Both vertebral artery origins are widely patent. Both vertebral arteries appear normal through the cervical region to the foramen magnum. Skeleton: Ordinary cervical spondylosis. Other neck: No mass or lymphadenopathy. Upper chest: Question mild edema at the lung apices. Review of the MIP images confirms the above findings CTA HEAD FINDINGS Anterior circulation: Both internal carotid arteries widely patent through the skull base and siphon regions. Ordinary siphon atherosclerosis without stenosis. The anterior and middle cerebral vessels are normal. Posterior circulation: Both vertebral arteries widely patent to the basilar. No basilar stenosis. Posterior circulation branch vessels are normal. Venous sinuses: Patent and normal. Anatomic variants: None significant. Review of the MIP images confirms the above findings IMPRESSION: No intracranial large or medium vessel occlusion. Atherosclerotic change at both carotid bifurcations. 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 No stenosis on the left. Electronically Signed    By: MNelson ChimesM.D.   On: 07/28/2021 21:01    Procedures Procedures   Medications Ordered in ED Medications  meclizine (ANTIVERT) tablet 25 mg (25 mg Oral Not Given 07/28/21 1808)  atorvastatin (LIPITOR) tablet 10 mg (has no administration in time range)  divalproex (DEPAKOTE ER) 24 hr tablet 250 mg (has no administration in time range)  divalproex (DEPAKOTE ER) 24 hr tablet 750 mg (has no administration in time range)  iohexol (OMNIPAQUE) 350 MG/ML injection 75 mL (75 mLs Intravenous Contrast Given 07/28/21 1457)    ED Course  I have reviewed the triage vital signs and the nursing notes.  Pertinent labs & imaging results that were available during my care of the patient were reviewed by me and considered in my medical decision making (see chart for details).    MDM Rules/Calculators/A&P                            75year old female with a history of diabetes, hypertension, history of chronic DVT, craniotomy and August 2021 for evacuation of subdural hematoma who presents with concern for acute onset of vertigo and balance difficulties.  Code Stroke initiated with concern for possible posterior circulation stroke with gait ataxia, mild limb ataxia, vertigo.  Dr. SLeonie ManNeurology evaluated.  CTA without acute abnormalities. Significantly decreased subdurals in comparison to prior. No acute bleed.   Discussed with Dr. SLeonie Man plan on admission to the hospital for further CVA or TIA work up.    Final Clinical Impression(s) / ED Diagnoses Final diagnoses:  Dizziness    Rx / DC Orders ED Discharge Orders     None        SGareth Morgan MD 07/29/21 0040

## 2021-07-28 NOTE — Consult Note (Signed)
Virtual Visit via Video Note  I connected with Amanda Collier on '@TODAY'$ @ at  by a video enabled telemedicine application and verified that I am speaking with the correct person using two identifiers.  Location: Patient: [facility]  Provider: Stroke center office at [facility]  HPI per patient and husband   I discussed the limitations of evaluation and management by telemedicine and the availability of in person appointments. The patient expressed understanding and agreed to proceed.  History of Present Illness: 75 year old Caucasian lady with past medical history of hypertension, hyperlipidemia, leg swelling, bilateral subdural hematomas following multiple falls in August 2021 s/p bilateral craniotomy with some residual gait and balance difficulties.  Patient states she had an episode of transient dizziness yesterday when she felt she was off balance for an hour but then got better.  This morning she was at a meeting in about 1250 and she tried to get up from the chair she felt dizzy off balance and had to sit down.  She needed some help to walk and came home with symptoms of dizziness and imbalance sensation did improve she came to the ER for further evaluation.  She denies true vertigo, nausea, diplopia, blurred vision, focal extremity weakness numbness or incoordination.  Upon assessment by ER physician Dr. Billy Fischer she was found to have mild right finger-to-nose ataxia gait ataxia and code stroke was called.  Noncontrast CT scan of the head was done and personally reviewed by me shows no acute abnormality and complete resolution of the previously seen lateral subdural hematomas with postcraniotomy stable bony defects.  By my assessment NIH stroke scale was 0 but.  Patient was asked to get up and walk and complain of subjective dizziness.  Last seen normal 12:50 PM today Baseline modified Rankin stroke scale 1 NIH stroke scale 0 IV thrombolysis considered no as resolving and  minimum deficits and recent subdural hematoma Mechanical thrombectomy considered no as exam not consistent with LVO   Observations/Objective: Pleasant mildly obese elderly Caucasian lady not in distress Neurological exam limited due to constraints of virtual video visit but she appears to be awake alert oriented to time place and person.  No dysarthria or aphasia.  Extraocular movements full range without nystagmus.  Face is symmetric without weakness.  Tongue midline.  Motor system exam symmetric upper and lower extremity strength without drift.  Fine finger movements are symmetric.  Reflexes not tested.  Sensation intact bilaterally.  Gait slightly broad-based and ataxic unable to walk unassisted. NIH stroke scale 0  CT scan of the head without contrast no acute abnormality.  No residual subdural hematoma.  Post craniotomy changes noted Emergent CT angiogram of the brain and neck-my preliminary reading no LVO.  Formal radiological reading pending Assessment and Plan: 75 year old lady with sudden onset of dizziness and gait ataxia of unclear etiology.  Vertebrobasilar ischemia and posterior circulation TIA versus small infarct is certainly a possibility.  Follow Up Instructions: Recommend transfer to [facility] for admission for stroke work-up.  Discussed with Dr. Gareth Morgan, ER, MD who will make the arrangements. MRI scan of the brain, lipid profile, hemoglobin A1c and echocardiogram. Aspirin for stroke prevention and if stroke is confirmed may consider switching to Plavix Aggressive risk factor modification. Check BMP, CBC and UA to look for any confounding medical conditions which may have worsened her baseline gait ataxia Long discussion with patient and husband and answered questions.   I discussed the assessment and treatment plan with the patient. The  patient was provided an opportunity to ask questions and all were answered. The patient agreed with the plan and  demonstrated an understanding of the instructions.   The patient was advised to call back or seek an in-person evaluation if the symptoms worsen or if the condition fails to improve as anticipated.  I provided 50 minutes of non-face-to-face time during this encounter.   Antony Contras, MD

## 2021-07-29 ENCOUNTER — Observation Stay (HOSPITAL_BASED_OUTPATIENT_CLINIC_OR_DEPARTMENT_OTHER): Payer: Medicare Other

## 2021-07-29 ENCOUNTER — Observation Stay (HOSPITAL_COMMUNITY): Payer: Medicare Other

## 2021-07-29 DIAGNOSIS — I10 Essential (primary) hypertension: Secondary | ICD-10-CM | POA: Diagnosis not present

## 2021-07-29 DIAGNOSIS — Z20822 Contact with and (suspected) exposure to covid-19: Secondary | ICD-10-CM | POA: Diagnosis not present

## 2021-07-29 DIAGNOSIS — Z9889 Other specified postprocedural states: Secondary | ICD-10-CM | POA: Diagnosis not present

## 2021-07-29 DIAGNOSIS — G459 Transient cerebral ischemic attack, unspecified: Secondary | ICD-10-CM | POA: Diagnosis not present

## 2021-07-29 DIAGNOSIS — I82409 Acute embolism and thrombosis of unspecified deep veins of unspecified lower extremity: Secondary | ICD-10-CM | POA: Diagnosis not present

## 2021-07-29 DIAGNOSIS — R42 Dizziness and giddiness: Secondary | ICD-10-CM | POA: Diagnosis not present

## 2021-07-29 DIAGNOSIS — Z7982 Long term (current) use of aspirin: Secondary | ICD-10-CM | POA: Diagnosis not present

## 2021-07-29 DIAGNOSIS — I6782 Cerebral ischemia: Secondary | ICD-10-CM | POA: Diagnosis not present

## 2021-07-29 DIAGNOSIS — Z79899 Other long term (current) drug therapy: Secondary | ICD-10-CM | POA: Diagnosis not present

## 2021-07-29 DIAGNOSIS — Y900 Blood alcohol level of less than 20 mg/100 ml: Secondary | ICD-10-CM | POA: Diagnosis not present

## 2021-07-29 DIAGNOSIS — E119 Type 2 diabetes mellitus without complications: Secondary | ICD-10-CM | POA: Diagnosis not present

## 2021-07-29 LAB — ECHOCARDIOGRAM COMPLETE
AR max vel: 1.75 cm2
AV Area VTI: 1.76 cm2
AV Area mean vel: 1.77 cm2
AV Mean grad: 8 mmHg
AV Peak grad: 14.7 mmHg
Ao pk vel: 1.92 m/s
Area-P 1/2: 2.77 cm2
Height: 64 in
MV VTI: 2.97 cm2
S' Lateral: 2.2 cm
Weight: 2857.16 oz

## 2021-07-29 LAB — LIPID PANEL
Cholesterol: 141 mg/dL (ref 0–200)
HDL: 57 mg/dL (ref >40)
LDL Cholesterol: 62 mg/dL (ref 0–99)
Total CHOL/HDL Ratio: 2.5 RATIO
Triglycerides: 108 mg/dL (ref <150)
VLDL: 22 mg/dL (ref 0–40)

## 2021-07-29 LAB — HEMOGLOBIN A1C
Hgb A1c MFr Bld: 6.6 % — ABNORMAL HIGH (ref 4.8–5.6)
Mean Plasma Glucose: 142.72 mg/dL

## 2021-07-29 MED ORDER — ASPIRIN 325 MG PO TABS
325.0000 mg | ORAL_TABLET | Freq: Once | ORAL | Status: AC
  Administered 2021-07-29: 325 mg via ORAL
  Filled 2021-07-29: qty 1

## 2021-07-29 MED ORDER — INFLUENZA VAC A&B SA ADJ QUAD 0.5 ML IM PRSY: 0.5000 mL | PREFILLED_SYRINGE | INTRAMUSCULAR | Status: DC

## 2021-07-29 MED ORDER — ACETAMINOPHEN 325 MG PO TABS: 650.0000 mg | ORAL_TABLET | ORAL | Status: DC | PRN

## 2021-07-29 MED ORDER — CLOPIDOGREL BISULFATE 75 MG PO TABS: 75.0000 mg | ORAL_TABLET | Freq: Every day | ORAL | 1 refills | Status: DC

## 2021-07-29 MED ORDER — STROKE: EARLY STAGES OF RECOVERY BOOK
Freq: Once | Status: AC
  Filled 2021-07-29: qty 1

## 2021-07-29 MED ORDER — TOPIRAMATE 25 MG PO TABS: 50.0000 mg | ORAL_TABLET | Freq: Every day | ORAL | Status: DC

## 2021-07-29 MED ORDER — DIVALPROEX SODIUM ER 500 MG PO TB24
750.0000 mg | ORAL_TABLET | Freq: Every day | ORAL | Status: DC
  Administered 2021-07-29: 750 mg via ORAL
  Filled 2021-07-29 (×2): qty 1

## 2021-07-29 MED ORDER — ASPIRIN EC 81 MG PO TBEC
81.0000 mg | DELAYED_RELEASE_TABLET | Freq: Every day | ORAL | Status: DC
  Filled 2021-07-29: qty 1

## 2021-07-29 MED ORDER — DIVALPROEX SODIUM ER 250 MG PO TB24
250.0000 mg | ORAL_TABLET | Freq: Every day | ORAL | Status: DC
  Administered 2021-07-29: 250 mg via ORAL
  Filled 2021-07-29: qty 1

## 2021-07-29 MED ORDER — ACETAMINOPHEN 650 MG RE SUPP: 650.0000 mg | RECTAL | Status: DC | PRN

## 2021-07-29 MED ORDER — ACETAMINOPHEN 160 MG/5ML PO SOLN: 650.0000 mg | ORAL | Status: DC | PRN

## 2021-07-29 NOTE — Progress Notes (Addendum)
Received pt from [facility] ED, alert and oriented, Paged Neurology on call and Admitting for any orders, placed on Tele, will continue to monitor, all questions answered, call light in reach

## 2021-07-29 NOTE — H&P (Signed)
History and Physical    Amanda Collier [id number] DOB: 1945/12/16 DOA: 07/28/2021  PCP: Leamon Arnt, MD Patient coming from: [facility]  Chief Complaint: Dizziness  HPI: Amanda Collier is a 75 y.o. female with medical history significant of hypertension, hyperlipidemia, prediabetes, bipolar disorder, bilateral subdural hematomas following multiple falls in August 2021 status post bilateral craniotomy presented to the ED with transient dizziness.  Vital signs stable.  Labs showing WBC 3.6 (mildly low on previous labs as well), hemoglobin 14.1, platelet count 194k.  Sodium 139, potassium 3.5, chloride 103, bicarb 25, BUN 25, creatinine 0.8, glucose 147.  Screening COVID and influenza PCR negative.  Blood ethanol level 13.  UDS negative.  UA without signs of infection. Code stroke initiated for gait ataxia and mild limb ataxia on exam and neurology consulted.  CT head negative for acute finding.  CTA head and neck negative for large or medium vessel occlusion. Patient was given meclizine.  Neurology recommended admission for stroke versus TIA work-up.  Patient states yesterday at church around 42 AM she experienced acute onset severe dizziness and was very unsteady when walking.  She had to sit down on a chair.  She did not have any change in her vision, difficulty with speech, or weakness in her arm or leg.  Denies room spinning sensation.  Does report prior history of vertigo.  Denies history of stroke or TIA.  She has no other complaints.  Denies fevers, cough, shortness of breath, vomiting, abdominal pain, diarrhea, or dysuria.  Review of Systems:  All systems reviewed and apart from history of presenting illness, are negative.  Past Medical History:  Diagnosis Date   Anxiety    Diabetes mellitus without complication ([place])    Headache syndrome 12/18/2016   Heart murmur 03/21/2021   Last echo 2018; calcified aortic valve w/o regurg or stenosis. Nl EF.    High blood pressure    Hypertension     followed by Dr Gillian Shields medications    Past Surgical History:  Procedure Laterality Date   CRANIOTOMY Bilateral 06/24/2020   Procedure: Bilateral craniotomy for evacuation of subdural hematoma;  Surgeon: Ashok Pall, MD;  Location: [place];  Service: Neurosurgery;  Laterality: Bilateral;   CRANIOTOMY Right 06/26/2020   Procedure: CRANIOTOMY HEMATOMA EVACUATION SUBDURAL;  Surgeon: Ashok Pall, MD;  Location: [place];  Service: Neurosurgery;  Laterality: Right;     reports that she has never smoked. She has never used smokeless tobacco. She reports current alcohol use of about 7.0 standard drinks per week. She reports that she does not use drugs.  Allergies  Allergen Reactions   Codeine Nausea Only   Lexapro [Escitalopram Oxalate] Other (See Comments)    GI upset   Lisinopril Cough   Lotensin [Benazepril Hcl] Other (See Comments)    Headaches    Family History  Adopted: Yes  Problem Relation Age of Onset   Heart attack Maternal Uncle        ? unknown if correct/patient adopted    Prior to Admission medications   Medication Sig Start Date End Date Taking? Authorizing Provider  aspirin 81 MG EC tablet Take 81 mg by mouth in the morning and at bedtime. Swallow whole.   Yes [provider]  atorvastatin (LIPITOR) 10 MG tablet Take 1 tablet (10 mg total) by mouth daily. 03/25/21  Yes Leamon Arnt, MD  divalproex (DEPAKOTE ER) 250 MG 24 hr tablet Take 1-3 tablets (250-750 mg total) by mouth See admin instructions. Take  250 mg by mouth in the morning and 750 mg at bedtime 11/02/20  Yes Meredith Staggers, MD  losartan (COZAAR) 25 MG tablet Take 1 tablet (25 mg total) by mouth daily. 09/23/20  Yes Meredith Staggers, MD  topiramate (TOPAMAX) 50 MG tablet Take 1 tablet (50 mg total) by mouth at bedtime. 03/23/21  Yes Suzzanne Cloud, NP  Vitamin D, Ergocalciferol, (DRISDOL) 1.25 MG (50000 UNIT) CAPS capsule Take 1 capsule (50,000 Units total) by mouth every Wednesday.  08/04/20  Yes Angiulli, Lavon Paganini, PA-C  zolpidem (AMBIEN) 10 MG tablet Take 10 mg by mouth at bedtime as needed. 01/12/21   [provider]    Physical Exam: Vitals:   07/28/21 2000 07/28/21 2200 07/29/21 0040 07/29/21 0119  BP: 135/73 129/67 (!) 154/82   Pulse: 73 67 63   Resp: '19 17 18   '$ Temp:   98.2 F (36.8 C)   TempSrc:   Oral   SpO2: 98% 98% 96%   Weight:    81 kg  Height:    '5\' 4"'$  (1.626 m)    Physical Exam Constitutional:      General: She is not in acute distress. HENT:     Head: Normocephalic and atraumatic.  Eyes:     Extraocular Movements: Extraocular movements intact.     Conjunctiva/sclera: Conjunctivae normal.  Cardiovascular:     Rate and Rhythm: Normal rate and regular rhythm.     Pulses: Normal pulses.  Pulmonary:     Effort: Pulmonary effort is normal. No respiratory distress.     Breath sounds: Normal breath sounds. No wheezing or rales.  Abdominal:     General: Bowel sounds are normal. There is no distension.     Palpations: Abdomen is soft.     Tenderness: There is no abdominal tenderness.  Musculoskeletal:        General: No swelling or tenderness.     Cervical back: Normal range of motion and neck supple.  Skin:    General: Skin is warm and dry.  Neurological:     Mental Status: She is alert and oriented to person, place, and time.     Cranial Nerves: No cranial nerve deficit.     Sensory: No sensory deficit.     Motor: No weakness.     Labs on Admission: I have personally reviewed following labs and imaging studies  CBC: Recent Labs  Lab 07/28/21 1420  WBC 3.6*  NEUTROABS 1.7  HGB 14.1  HCT 41.8  MCV 96.3  PLT [id number]   Basic Metabolic Panel: Recent Labs  Lab 07/28/21 1420  NA 139  K 3.5  CL 103  CO2 25  GLUCOSE 147*  BUN 25*  CREATININE 0.89  CALCIUM 9.5   GFR: Estimated Creatinine Clearance: 56.2 mL/min (by C-G formula based on SCr of 0.89 mg/dL). Liver Function Tests: Recent Labs  Lab 07/28/21 1420  AST 12*   ALT 11  ALKPHOS 78  BILITOT 0.5  PROT 6.9  ALBUMIN 4.4   No results for input(s): LIPASE, AMYLASE in the last 168 hours. No results for input(s): AMMONIA in the last 168 hours. Coagulation Profile: Recent Labs  Lab 07/28/21 1420  INR 0.9   Cardiac Enzymes: No results for input(s): CKTOTAL, CKMB, CKMBINDEX, TROPONINI in the last 168 hours. BNP (last 3 results) No results for input(s): PROBNP in the last 8760 hours. HbA1C: No results for input(s): HGBA1C in the last 72 hours. CBG: Recent Labs  Lab 07/28/21 [place]  143*   Lipid Profile: No results for input(s): CHOL, HDL, LDLCALC, TRIG, CHOLHDL, LDLDIRECT in the last 72 hours. Thyroid Function Tests: No results for input(s): TSH, T4TOTAL, FREET4, T3FREE, THYROIDAB in the last 72 hours. Anemia Panel: No results for input(s): VITAMINB12, FOLATE, FERRITIN, TIBC, IRON, RETICCTPCT in the last 72 hours. Urine analysis:    Component Value Date/Time   COLORURINE COLORLESS (A) 07/28/2021 1515   APPEARANCEUR CLEAR 07/28/2021 1515   LABSPEC 1.009 07/28/2021 1515   PHURINE 6.5 07/28/2021 1515   GLUCOSEU NEGATIVE 07/28/2021 1515   HGBUR NEGATIVE 07/28/2021 [place] 07/28/2021 1515   BILIRUBINUR Negative 07/25/2021 [place] 07/28/2021 1515   PROTEINUR NEGATIVE 07/28/2021 1515   UROBILINOGEN 0.2 07/25/2021 1116   NITRITE NEGATIVE 07/28/2021 1515   LEUKOCYTESUR NEGATIVE 07/28/2021 1515    Radiological Exams on Admission: CT HEAD CODE STROKE WO CONTRAST  Result Date: 07/28/2021 CLINICAL DATA:  Code stroke.  Vertigo EXAM: CT HEAD WITHOUT CONTRAST TECHNIQUE: Contiguous axial images were obtained from the base of the skull through the vertex without intravenous contrast. COMPARISON:  CT head 08/20/2020, brain MRI 06/23/2020 FINDINGS: Brain: Small bilateral subdural fluid collections are seen measuring up to 3-4 mm in the coronal plane bilaterally, decreased in size compared to the most recent  CT from 08/20/2020. There is no evidence of new or acute intracranial hemorrhage, extra-axial fluid collection, or acute infarct. There is mild parenchymal volume loss and chronic white matter microangiopathy. The ventricles are normal in size. There is no mass lesion. There is no midline shift. Vascular: There is calcification of the bilateral cavernous ICAs. Skull: The patient is status post right parietal craniotomy and left parietal burr hole for evacuation of previously seen subdural hematomas. There is no calvarial fracture or suspicious osseous lesion. Sinuses/Orbits: There is an incompletely imaged mucous retention cyst in the left maxillary sinus. The other paranasal sinuses are clear. The globes and orbits are unremarkable. Other: None. ASPECTS [facility] Stroke Program Early CT Score) - Ganglionic level infarction (caudate, lentiform nuclei, internal capsule, insula, M1-M3 cortex): 7 - Supraganglionic infarction (M4-M6 cortex): 3 Total score (0-10 with 10 being normal): 10 IMPRESSION: 1. No acute intracranial hemorrhage or infarct. 2. ASPECTS is 10 3. 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 These results were called by telephone at the time of interpretation on 07/28/2021 at 2:48 pm to provider Dr Leonie Man, who verbally acknowledged these results. Electronically Signed   By: Valetta Mole M.D.   On: 07/28/2021 14:59   CT ANGIO HEAD NECK W WO CM (CODE STROKE)  Result Date: 07/28/2021 CLINICAL DATA:  Nonspecific dizziness. EXAM: CT ANGIOGRAPHY HEAD AND NECK TECHNIQUE: Multidetector CT imaging of the head and neck was performed using the standard protocol during bolus administration of intravenous contrast. Multiplanar CT image reconstructions and MIPs were obtained to evaluate the vascular anatomy. Carotid stenosis measurements (when applicable) are obtained utilizing NASCET criteria, using the distal internal  carotid diameter as the denominator. CONTRAST:  51m OMNIPAQUE IOHEXOL 350 MG/ML SOLN COMPARISON:  Head CT earlier same day FINDINGS: CTA NECK FINDINGS Aortic arch: Aortic atherosclerosis. Right carotid system: Common carotid artery widely patent to the bifurcation. Ordinary atherosclerotic plaque at the carotid bifurcation. 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 ICA widely patent beyond that. Left carotid system: Common carotid artery widely patent to the bifurcation. Atherosclerotic calcification at the carotid bifurcation but no stenosis. Vertebral arteries: Both vertebral artery origins  are widely patent. Both vertebral arteries appear normal through the cervical region to the foramen magnum. Skeleton: Ordinary cervical spondylosis. Other neck: No mass or lymphadenopathy. Upper chest: Question mild edema at the lung apices. Review of the MIP images confirms the above findings CTA HEAD FINDINGS Anterior circulation: Both internal carotid arteries widely patent through the skull base and siphon regions. Ordinary siphon atherosclerosis without stenosis. The anterior and middle cerebral vessels are normal. Posterior circulation: Both vertebral arteries widely patent to the basilar. No basilar stenosis. Posterior circulation branch vessels are normal. Venous sinuses: Patent and normal. Anatomic variants: None significant. Review of the MIP images confirms the above findings IMPRESSION: No intracranial large or medium vessel occlusion. Atherosclerotic change at both carotid bifurcations. 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 No stenosis on the left. Electronically Signed   By: Nelson Chimes M.D.   On: 07/28/2021 21:01    EKG: Independently reviewed.  Sinus rhythm, no significant change since prior tracing.  Assessment/Plan Principal Problem:   Dizziness Active Problems:   Essential hypertension   Alcohol abuse   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HCC)   Mixed hyperlipidemia   Acute onset dizziness and  ataxia Blood ethanol level 13.  UDS negative.  UA without signs of infection. Code stroke initiated in the ED for gait ataxia and mild limb ataxia on exam.  CT head negative for acute finding.  CTA head and neck negative for large or medium vessel occlusion. Patient was given meclizine.  Neurology feels her symptoms could be due to vertebrobasilar ischemia and posterior circulation TIA versus small infarct. -Telemetry monitoring -MRI of the brain without contrast -TTE -Hemoglobin A1c, fasting lipid panel -Neurology recommending starting aspirin for stroke prevention and if stroke is confirmed, may consider switching to Plavix -Atorvastatin 80 mg now and daily -Aggressive risk factor modification -Frequent neurochecks -PT/OT/SLP  Hypertension No significant elevation of blood pressure. -Brain MRI pending to rule out stroke, hold antihypertensives at this time.  Hyperlipidemia -Continue Lipitor  Prediabetes A1c 5.6 on 03/21/2021.  Chronic headaches Followed by neurology. -Continue Topamax  Bipolar disorder -Continue Depakote  History of alcohol abuse -CIWA monitoring  DVT prophylaxis: SCDs Code Status: Patient wishes to be full code. Family Communication: No family available at this time. Disposition Plan: Status is: Observation  The patient remains OBS appropriate and will d/c before 2 midnights.  Dispo: The patient is from: Home              Anticipated d/c is to: Home              Patient currently is not medically stable to d/c.   Difficult to place patient No  Level of care: Level of care: Telemetry Medical  The medical decision making on this patient was of high complexity and the patient is at high risk for clinical deterioration, therefore this is a level 3 visit.  Shela Leff MD [facility]  If 7PM-7AM, please contact night-coverage [email or web address]  07/29/2021, 2:45 AM

## 2021-07-29 NOTE — Progress Notes (Addendum)
PROGRESS NOTE    Amanda Collier  [id number] DOB: 07/22/1946 DOA: 07/28/2021 PCP: Leamon Arnt, MD  Brief Narrative: Amanda Collier is a 75 y.o. female with medical history significant of hypertension, hyperlipidemia, prediabetes, bipolar disorder, bilateral subdural hematomas following multiple falls in August 2021 status post bilateral craniotomy presented to the ED with dizziness, unsteady gait that started suddenly yesterday afternoon, was seen at [facility], blood work was unremarkable, CT head was negative and CTA head and neck was negative for large vessel occlusion, seen by neuro telemetry neurology and admission recommended for further work-up   Assessment & Plan:   Acute onset dizziness, ataxia -Etiology is not clear at this time -Neurology following, plan for MRI to rule out posterior circulation CVA -Continue aspirin and statin, on this at baseline -Also follow-up echo, HbA1c and lipid panel -CTA head and neck was negative for large vessel occlusion -PT OT eval today, vestibular eval  Hypertension -Stable, antihypertensives on hold, pending stroke work-up  History of chronic headaches -Continue Topamax  Borderline diabetes --HbA1c was 5.6 in 5/22  Dyslipidemia -Continue Lipitor  History of bipolar disorder -Continue Depakote  DVT prophylaxis: On Lovenox Code Status: Full code Family Communication: Discussed with patient in detail, no family at bedside Disposition Plan:  Status is: Observation  Dispo: The patient is from: Home              Anticipated d/c is to: Home              Patient currently is not medically stable to d/c.   Difficult to place patient No        Consultants:  Neurology  Procedures:   Antimicrobials:    Subjective: -Feels better today, still has some unsteadiness  Objective: Vitals:   07/28/21 2200 07/29/21 0040 07/29/21 0119 07/29/21 0402  BP: 129/67 (!) 154/82  110/61  Pulse: 67 63  (!) 53  Resp: '17 18   17  '$ Temp:  98.2 F (36.8 C)  97.9 F (36.6 C)  TempSrc:  Oral  Oral  SpO2: 98% 96%  96%  Weight:   81 kg   Height:   '5\' 4"'$  (1.626 m)    No intake or output data in the 24 hours ending 07/29/21 1018 Filed Weights   07/29/21 0119  Weight: 81 kg    Examination:  General exam: AAOx3, no distress HEENT: Neck no JVD, pupils equal and reactive, extraocular movements intact CVS: S1-S2, regular rate rhythm Lungs: Clear bilaterally Abdomen: Soft, nontender, bowel sounds present Extremities: No edema Neuro: Motor 5 x 5 in all extremities, sensation slight touch intact, DTR 1+, plantars  Psych: Mood & affect appropriate.     Data Reviewed:   CBC: Recent Labs  Lab 07/28/21 1420  WBC 3.6*  NEUTROABS 1.7  HGB 14.1  HCT 41.8  MCV 96.3  PLT [id number]   Basic Metabolic Panel: Recent Labs  Lab 07/28/21 1420  NA 139  K 3.5  CL 103  CO2 25  GLUCOSE 147*  BUN 25*  CREATININE 0.89  CALCIUM 9.5   GFR: Estimated Creatinine Clearance: 56.2 mL/min (by C-G formula based on SCr of 0.89 mg/dL). Liver Function Tests: Recent Labs  Lab 07/28/21 1420  AST 12*  ALT 11  ALKPHOS 78  BILITOT 0.5  PROT 6.9  ALBUMIN 4.4   No results for input(s): LIPASE, AMYLASE in the last 168 hours. No results for input(s): AMMONIA in the last 168 hours. Coagulation Profile: Recent Labs  Lab 07/28/21  1420  INR 0.9   Cardiac Enzymes: No results for input(s): CKTOTAL, CKMB, CKMBINDEX, TROPONINI in the last 168 hours. BNP (last 3 results) No results for input(s): PROBNP in the last 8760 hours. HbA1C: Recent Labs    07/29/21 0313  HGBA1C 6.6*   CBG: Recent Labs  Lab 07/28/21 1423  GLUCAP 143*   Lipid Profile: Recent Labs    07/29/21 0313  CHOL 141  HDL 57  LDLCALC 62  TRIG 108  CHOLHDL 2.5   Thyroid Function Tests: No results for input(s): TSH, T4TOTAL, FREET4, T3FREE, THYROIDAB in the last 72 hours. Anemia Panel: No results for input(s): VITAMINB12, FOLATE, FERRITIN, TIBC,  IRON, RETICCTPCT in the last 72 hours. Urine analysis:    Component Value Date/Time   COLORURINE COLORLESS (A) 07/28/2021 1515   APPEARANCEUR CLEAR 07/28/2021 1515   LABSPEC 1.009 07/28/2021 1515   PHURINE 6.5 07/28/2021 1515   GLUCOSEU NEGATIVE 07/28/2021 1515   HGBUR NEGATIVE 07/28/2021 [place] 07/28/2021 1515   BILIRUBINUR Negative 07/25/2021 1116   [place] 07/28/2021 1515   PROTEINUR NEGATIVE 07/28/2021 1515   UROBILINOGEN 0.2 07/25/2021 1116   NITRITE NEGATIVE 07/28/2021 1515   LEUKOCYTESUR NEGATIVE 07/28/2021 1515   Sepsis Labs: '@LABRCNTIP'$ (procalcitonin:4,lacticidven:4)  ) Recent Results (from the past 240 hour(s))  Resp Panel by RT-PCR (Flu A&B, Covid) Nasopharyngeal Swab     Status: None   Collection Time: 07/28/21  2:20 PM   Specimen: Nasopharyngeal Swab; Nasopharyngeal(NP) swabs in vial transport medium  Result Value Ref Range Status   SARS Coronavirus 2 by RT PCR NEGATIVE NEGATIVE Final    Comment: (NOTE) SARS-CoV-2 target nucleic acids are NOT DETECTED.  The SARS-CoV-2 RNA is generally detectable in upper respiratory specimens during the acute phase of infection. The lowest concentration of SARS-CoV-2 viral copies this assay can detect is 138 copies/mL. A negative result does not preclude SARS-Cov-2 infection and should not be used as the sole basis for treatment or other patient management decisions. A negative result may occur with  improper specimen collection/handling, submission of specimen other than nasopharyngeal swab, presence of viral mutation(s) within the areas targeted by this assay, and inadequate number of viral copies(<138 copies/mL). A negative result must be combined with clinical observations, patient history, and epidemiological information. The expected result is Negative.  Fact Sheet for Patients:  EntrepreneurPulse.com.au  Fact Sheet for Healthcare Providers:   IncredibleEmployment.be  This test is no t yet approved or cleared by the [place] FDA and  has been authorized for detection and/or diagnosis of SARS-CoV-2 by FDA under an Emergency Use Authorization (EUA). This EUA will remain  in effect (meaning this test can be used) for the duration of the COVID-19 declaration under Section 564(b)(1) of the Act, 21 U.S.C.section 360bbb-3(b)(1), unless the authorization is terminated  or revoked sooner.       Influenza A by PCR NEGATIVE NEGATIVE Final   Influenza B by PCR NEGATIVE NEGATIVE Final    Comment: (NOTE) The Xpert Xpress SARS-CoV-2/FLU/RSV plus assay is intended as an aid in the diagnosis of influenza from Nasopharyngeal swab specimens and should not be used as a sole basis for treatment. Nasal washings and aspirates are unacceptable for Xpert Xpress SARS-CoV-2/FLU/RSV testing.  Fact Sheet for Patients: EntrepreneurPulse.com.au  Fact Sheet for Healthcare Providers: IncredibleEmployment.be  This test is not yet approved or cleared by the [place] FDA and has been authorized for detection and/or diagnosis of SARS-CoV-2 by FDA under an Emergency Use Authorization (EUA). This EUA will  remain in effect (meaning this test can be used) for the duration of the COVID-19 declaration under Section 564(b)(1) of the Act, 21 U.S.C. section 360bbb-3(b)(1), unless the authorization is terminated or revoked.  Performed at [facility], [address] [place], [place] [address]          Radiology Studies: CT HEAD CODE STROKE WO CONTRAST  Result Date: 07/28/2021 CLINICAL DATA:  Code stroke.  Vertigo EXAM: CT HEAD WITHOUT CONTRAST TECHNIQUE: Contiguous axial images were obtained from the base of the skull through the vertex without intravenous contrast. COMPARISON:  CT head 08/20/2020, brain MRI 06/23/2020 FINDINGS: Brain: Small bilateral subdural fluid  collections are seen measuring up to 3-4 mm in the coronal plane bilaterally, decreased in size compared to the most recent CT from 08/20/2020. There is no evidence of new or acute intracranial hemorrhage, extra-axial fluid collection, or acute infarct. There is mild parenchymal volume loss and chronic white matter microangiopathy. The ventricles are normal in size. There is no mass lesion. There is no midline shift. Vascular: There is calcification of the bilateral cavernous ICAs. Skull: The patient is status post right parietal craniotomy and left parietal burr hole for evacuation of previously seen subdural hematomas. There is no calvarial fracture or suspicious osseous lesion. Sinuses/Orbits: There is an incompletely imaged mucous retention cyst in the left maxillary sinus. The other paranasal sinuses are clear. The globes and orbits are unremarkable. Other: None. ASPECTS [facility] Stroke Program Early CT Score) - Ganglionic level infarction (caudate, lentiform nuclei, internal capsule, insula, M1-M3 cortex): 7 - Supraganglionic infarction (M4-M6 cortex): 3 Total score (0-10 with 10 being normal): 10 IMPRESSION: 1. No acute intracranial hemorrhage or infarct. 2. ASPECTS is 10 3. 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 These results were called by telephone at the time of interpretation on 07/28/2021 at 2:48 pm to provider Dr Leonie Man, who verbally acknowledged these results. Electronically Signed   By: Valetta Mole M.D.   On: 07/28/2021 14:59   CT ANGIO HEAD NECK W WO CM (CODE STROKE)  Result Date: 07/28/2021 CLINICAL DATA:  Nonspecific dizziness. EXAM: CT ANGIOGRAPHY HEAD AND NECK TECHNIQUE: Multidetector CT imaging of the head and neck was performed using the standard protocol during bolus administration of intravenous contrast. Multiplanar CT image reconstructions and MIPs were obtained to evaluate the vascular  anatomy. Carotid stenosis measurements (when applicable) are obtained utilizing NASCET criteria, using the distal internal carotid diameter as the denominator. CONTRAST:  22m OMNIPAQUE IOHEXOL 350 MG/ML SOLN COMPARISON:  Head CT earlier same day FINDINGS: CTA NECK FINDINGS Aortic arch: Aortic atherosclerosis. Right carotid system: Common carotid artery widely patent to the bifurcation. Ordinary atherosclerotic plaque at the carotid bifurcation. 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 ICA widely patent beyond that. Left carotid system: Common carotid artery widely patent to the bifurcation. Atherosclerotic calcification at the carotid bifurcation but no stenosis. Vertebral arteries: Both vertebral artery origins are widely patent. Both vertebral arteries appear normal through the cervical region to the foramen magnum. Skeleton: Ordinary cervical spondylosis. Other neck: No mass or lymphadenopathy. Upper chest: Question mild edema at the lung apices. Review of the MIP images confirms the above findings CTA HEAD FINDINGS Anterior circulation: Both internal carotid arteries widely patent through the skull base and siphon regions. Ordinary siphon atherosclerosis without stenosis. The anterior and middle cerebral vessels are normal. Posterior circulation: Both vertebral arteries widely patent to the basilar. No basilar stenosis. Posterior circulation branch  vessels are normal. Venous sinuses: Patent and normal. Anatomic variants: None significant. Review of the MIP images confirms the above findings IMPRESSION: No intracranial large or medium vessel occlusion. Atherosclerotic change at both carotid bifurcations. 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 No stenosis on the left. Electronically Signed   By: Nelson Chimes M.D.   On: 07/28/2021 21:01        Scheduled Meds:  [START ON 07/30/2021] aspirin EC  81 mg Oral Daily   atorvastatin  10 mg Oral Daily   divalproex  250 mg Oral Daily   divalproex  750 mg Oral QHS    topiramate  50 mg Oral QHS   Continuous Infusions:   LOS: 0 days    Time spent: 25mn    PDomenic Polite MD [facility]   07/29/2021, 10:18 AM

## 2021-07-29 NOTE — Evaluation (Signed)
Occupational Therapy Evaluation Patient Details Name: Amanda Collier MRN: [id number] DOB: [birth year] Today's Date: 07/29/2021   History of Present Illness 75 y.o. F dmitted to [facility] on 9/15 due to sudden onset of severe dizziness and balance concerns. Prior medical history significant of hypertension, hyperlipidemia, prediabetes, bipolar disorder, bilateral subdural hematomas following multiple falls in August 2021 status post bilateral craniotomy.   Clinical Impression   Pt admitted for concerns listed above. PTA pt reported that she was independent with all ADL's and IADL's, except walking long distances, due to fear of falling. At this time, pt presents with continued independence with all ADL's and functional mobility. Pt's vision was assessed with no further dizzy symptoms at this time, however pt continued to demonstrate slight imbalance, especially with bending and making quick turns. Pt does not need further OT services at this time and acute OT will sign off.      Recommendations for follow up therapy are one component of a multi-disciplinary discharge planning process, led by the attending physician.  Recommendations may be updated based on patient status, additional functional criteria and insurance authorization.   Follow Up Recommendations  No OT follow up    Equipment Recommendations  None recommended by OT    Recommendations for Other Services       Precautions / Restrictions Precautions Precautions: Fall Restrictions Weight Bearing Restrictions: No      Mobility Bed Mobility Overal bed mobility: Modified Independent             General bed mobility comments: HOB elevated    Transfers Overall transfer level: Modified independent Equipment used: None             General transfer comment: pt completed multiple sit<>stands with no difficulties    Balance Overall balance assessment: Needs assistance Sitting-balance support: No upper extremity  supported;Feet supported Sitting balance-Leahy Scale: Normal     Standing balance support: No upper extremity supported;During functional activity Standing balance-Leahy Scale: Fair Standing balance comment: Pt presenting balance concerns with bending over to complete LB ADL's and quick turns.                           ADL either performed or assessed with clinical judgement   ADL Overall ADL's : Modified independent                                       General ADL Comments: Pt able to complete all ADL's at mod I level, does better with balance when holding on to a stable surface or sitting. Recommend using a shower chair when home.     Vision Baseline Vision/History: 1 Wears glasses Ability to See in Adequate Light: 0 Adequate Patient Visual Report: Other (comment) (Dizziness) Vision Assessment?: No apparent visual deficits Additional Comments: Pt reported that when she was feeling dizzy, the room wasn't spinning, she felt more like she was drunk and going to fall.     Perception Perception Perception Tested?: No   Praxis Praxis Praxis tested?: Within functional limits    Pertinent Vitals/Pain Pain Assessment: No/denies pain     Hand Dominance Right   Extremity/Trunk Assessment Upper Extremity Assessment Upper Extremity Assessment: Overall WFL for tasks assessed   Lower Extremity Assessment Lower Extremity Assessment: Overall WFL for tasks assessed   Cervical / Trunk Assessment Cervical / Trunk Assessment: Normal   Communication Communication  Communication: No difficulties   Cognition Arousal/Alertness: Awake/alert Behavior During Therapy: WFL for tasks assessed/performed Overall Cognitive Status: Within Functional Limits for tasks assessed                                 General Comments: Pt worried about falling, very conservative with her movements and mobility.   General Comments  VSS on RA    Exercises      Shoulder Instructions      Home Living Family/patient expects to be discharged to:: Private residence Living Arrangements: Spouse/significant other Available Help at Discharge: Family;Available 24 hours/day Type of Home: House Home Access: Stairs to enter [employer] of Steps: 4-5 Entrance Stairs-Rails: Left Home Layout: 1/2 bath on main level;Bed/bath upstairs;Two level Alternate Level Stairs-Number of Steps: full flight Alternate Level Stairs-Rails: Left;Right Bathroom Shower/Tub: Teacher, early years/pre: Standard Bathroom Accessibility: Yes How Accessible: Accessible via walker Home Equipment: [employer] - 2 wheels;Cane - quad;Shower seat;Grab bars - tub/shower;Bedside commode          Prior Functioning/Environment Level of Independence: Independent        Comments: Pt reporting  she does not walk long distances any more due to her craniotomy        OT Problem List: Impaired balance (sitting and/or standing)      OT Treatment/Interventions:      OT Goals(Current goals can be found in the care plan section) Acute Rehab OT Goals Patient Stated Goal: To feel back to normal and stable OT Goal Formulation: All assessment and education complete, DC therapy Time For Goal Achievement: 07/29/21 Potential to Achieve Goals: Good  OT Frequency:     Barriers to D/C:            Co-evaluation              AM-PAC OT "6 Clicks" Daily Activity     Outcome Measure Help from another person eating meals?: None Help from another person taking care of personal grooming?: None Help from another person toileting, which includes using toliet, bedpan, or urinal?: None Help from another person bathing (including washing, rinsing, drying)?: None Help from another person to put on and taking off regular upper body clothing?: None Help from another person to put on and taking off regular lower body clothing?: None 6 Click Score: 24   End of Session Equipment  Utilized During Treatment: Gait belt;Rolling walker Nurse Communication: Mobility status  Activity Tolerance:   Patient left: in bed;with call bell/phone within reach  OT Visit Diagnosis: Unsteadiness on feet (R26.81);History of falling (Z91.81)                Time: [id number] OT Time Calculation (min): 31 min Charges:  OT General Charges $OT Visit: 1 Visit OT Evaluation $OT Eval Low Complexity: 1 Low OT Treatments $Therapeutic Activity: 8-22 mins  Ilian Wessell H., OTR/L Acute Rehabilitation  Son Barkan Elane Maryclare Nydam 07/29/2021, 2:03 PM

## 2021-07-29 NOTE — Care Management Obs Status (Signed)
[facility] NOTIFICATION   Patient Details  Name: HIBO FETTE MRN: [id number] Date of Birth: Aug 23, 1946   Medicare Observation Status Notification Given:  Yes    Pollie Friar, RN 07/29/2021, 3:34 PM

## 2021-07-29 NOTE — Progress Notes (Signed)
2D Echocardiogram completed.

## 2021-07-29 NOTE — Evaluation (Signed)
Physical Therapy Evaluation Patient Details Name: Amanda Collier MRN: [id number] DOB: [birth year] Today's Date: 07/29/2021  History of Present Illness  Pt is a 75 y/o F admitted to [facility] on 9/15 due to sudden onset of severe dizziness and balance concerns. CT and MRI of brain negative for acute changes. PMH significant for HTN,  prediabetes, bipolar disorder, bilateral SDH following multiple falls in August 2021 status post bilateral craniotomy (d/c from CIR 1 year ago)   Clinical Impression  Pt admitted with above diagnosis. Pt currently with functional limitations due to the deficits listed below (see PT Problem List). At the time of PT eval pt was able to perform transfers with modified independence and ambulation with up to min guard assist. Pt trialed no AD, SPC, RW, and rollator. Pt reports due to "vanity" she will work towards not using an AD, and declines using one at home due to vanity as well. Pt and husband were educated on safety and that pt continued to appear guarded and mildly unsteady with the SPC vs when she was using the RW or rollator. Feel this patient would benefit from outpatient PT to maximize functional independence, safety, and improve overall balance. Of note, pt did NOT complain of any dizziness throughout session, even with rolling in the bed, sit<>stand, and ambulation activity. Pt reports her dizziness was not considered "room spinning" or "vertigo". BPPV not likely. Acutely, pt will benefit from skilled PT to increase their independence and safety with mobility to allow discharge to the venue listed below.          Recommendations for follow up therapy are one component of a multi-disciplinary discharge planning process, led by the attending physician.  Recommendations may be updated based on patient status, additional functional criteria and insurance authorization.  Follow Up Recommendations Outpatient PT;Supervision for mobility/OOB    Equipment Recommendations  Cane  (Single point)    Recommendations for Other Services       Precautions / Restrictions Precautions Precautions: Fall Restrictions Weight Bearing Restrictions: No      Mobility  Bed Mobility Overal bed mobility: Modified Independent             General bed mobility comments: HOB flat and rails lowered to simulate home environment.    Transfers Overall transfer level: Modified independent Equipment used: None;Rolling walker (2 wheeled);Straight cane             General transfer comment: Pt demonstrated good hand placement on seated surface for safety. No assist required.  Ambulation/Gait Ambulation/Gait assistance: Modified independent (Device/Increase time);Min guard Gait Distance (Feet): 300 Feet Assistive device: Rolling walker (2 wheeled);4-wheeled walker;Straight cane;None Gait Pattern/deviations: Step-through pattern;Decreased stride length;Trunk flexed;Wide base of support Gait velocity: Decreased Gait velocity interpretation: 1.31 - 2.62 ft/sec, indicative of limited community ambulator General Gait Details: VC's for improved posture, narrower BOS, and general safety. Pt initially without AD and appeared very guarded with slow gait speed. Minimal improvement with the [facility] as pt reports trying not to use it "for vanity". With RW and rollator, pt demonstrated improved gait speed, and decreased unsteadiness. Pt declines using the RW or rollator at home, stating if she needs something she will use the quad cane. Pt was educated on safety awareness and preference of SPC to QC, as I feel that the pt may get tripped up easier with the QC.  Stairs Stairs: Yes Stairs assistance: Min guard Stair Management: One rail Right;Alternating pattern;Forwards Number of Stairs: 5 General stair comments: No assist; slow but generally  steady with railing support  Wheelchair Mobility    Modified Rankin (Stroke Patients Only)       Balance Overall balance assessment: Needs  assistance Sitting-balance support: No upper extremity supported;Feet supported Sitting balance-Leahy Scale: Fair Sitting balance - Comments: posterior lean with LE MMT Postural control: Posterior lean Standing balance support: No upper extremity supported;During functional activity Standing balance-Leahy Scale: Fair Standing balance comment: Pt presenting balance concerns with bending over to complete LB ADL's and quick turns.                             Pertinent Vitals/Pain Pain Assessment: No/denies pain    Home Living Family/patient expects to be discharged to:: Private residence Living Arrangements: Spouse/significant other Available Help at Discharge: Family;Available 24 hours/day Type of Home: House Home Access: Stairs to enter Entrance Stairs-Rails: Left Entrance Stairs-Number of Steps: 4-5 Home Layout: 1/2 bath on main level;Bed/bath upstairs;Two level Home Equipment: Walker - 2 wheels;Cane - quad;Shower seat;Grab bars - tub/shower;Bedside commode      Prior Function Level of Independence: Independent         Comments: Pt reporting  she does not walk long distances any more due to her craniotomy     Hand Dominance   Dominant Hand: Right    Extremity/Trunk Assessment   Upper Extremity Assessment Upper Extremity Assessment: Overall WFL for tasks assessed    Lower Extremity Assessment Lower Extremity Assessment: Overall WFL for tasks assessed (Strength 5/5 Bilaterally in quads, hip flexors, hamstrings)    Cervical / Trunk Assessment Cervical / Trunk Assessment: Normal (Mild forward head posture with rounded shoulders)  Communication   Communication: No difficulties  Cognition Arousal/Alertness: Awake/alert Behavior During Therapy: WFL for tasks assessed/performed Overall Cognitive Status: Impaired/Different from baseline Area of Impairment: Safety/judgement;Problem solving;Attention                   Current Attention Level:  Selective (approaching alternating)     Safety/Judgement: Decreased awareness of safety;Decreased awareness of deficits   Problem Solving: Requires verbal cues General Comments: Difficulty discussing 1 topic at a time - appears internally distracted. Perseverates on prior CIR admission and attempts to relate everything back to what she was able to do when she d/c from CIR, not at present time. Endorses new balance deficits however not agreeable to utilizing a RW due to how it looks".      General Comments General comments (skin integrity, edema, etc.): VSS on RA    Exercises     Assessment/Plan    PT Assessment Patient needs continued PT services  PT Problem List Decreased strength;Decreased activity tolerance;Decreased balance;Decreased mobility;Decreased knowledge of use of DME;Decreased safety awareness;Decreased knowledge of precautions;Decreased cognition       PT Treatment Interventions DME instruction;Gait training;Stair training;Functional mobility training;Therapeutic activities;Therapeutic exercise;Neuromuscular re-education;Patient/family education;Cognitive remediation    PT Goals (Current goals can be found in the Care Plan section)  Acute Rehab PT Goals Patient Stated Goal: To do as well as she was doing a year ago when she d/c from CIR PT Goal Formulation: With patient/family Time For Goal Achievement: 08/05/21 Potential to Achieve Goals: Good    Frequency Min 3X/week   Barriers to discharge        Co-evaluation               AM-PAC PT "6 Clicks" Mobility  Outcome Measure Help needed turning from your back to your side while in a flat bed without using  bedrails?: None Help needed moving from lying on your back to sitting on the side of a flat bed without using bedrails?: None Help needed moving to and from a bed to a chair (including a wheelchair)?: A Little Help needed standing up from a chair using your arms (e.g., wheelchair or bedside chair)?:  None Help needed to walk in hospital room?: A Little Help needed climbing 3-5 steps with a railing? : A Little 6 Click Score: 21    End of Session Equipment Utilized During Treatment: Gait belt Activity Tolerance: Patient tolerated treatment well Patient left: in bed;with call bell/phone within reach;with family/visitor present Nurse Communication: Mobility status PT Visit Diagnosis: Unsteadiness on feet (R26.81)    Time: [id number] PT Time Calculation (min) (ACUTE ONLY): 33 min   Charges:   PT Evaluation $PT Eval Low Complexity: 1 Low PT Treatments $Gait Training: 8-22 mins        Rolinda Roan, PT, DPT Acute Rehabilitation Services Pager: [phone] Office: [phone]   Thelma Comp 07/29/2021, 2:44 PM

## 2021-07-29 NOTE — Progress Notes (Signed)
STROKE TEAM PROGRESS NOTE   INTERVAL HISTORY No acute events .  Patient states that her dizziness is improving and she was able to walk but is still slightly off balance.  Physical therapy has evaluated her and recommended outpatient therapy.  MRI scan of the brain is negative for acute infarct.  Echocardiogram Shows normal ejection fraction of 55 to 60%.  LDL cholesterol is 62 mg percent and hemoglobin A1c 6.6.    Vitals:   07/28/21 2200 07/29/21 0040 07/29/21 0119 07/29/21 0402  BP: 129/67 (!) 154/82  110/61  Pulse: 67 63  (!) 53  Resp: '17 18  17  '$ Temp:  98.2 F (36.8 C)  97.9 F (36.6 C)  TempSrc:  Oral  Oral  SpO2: 98% 96%  96%  Weight:   81 kg   Height:   '5\' 4"'$  (1.626 m)    CBC:  Recent Labs  Lab 07/28/21 1420  WBC 3.6*  NEUTROABS 1.7  HGB 14.1  HCT 41.8  MCV 96.3  PLT [id number]   Basic Metabolic Panel:  Recent Labs  Lab 07/28/21 1420  NA 139  K 3.5  CL 103  CO2 25  GLUCOSE 147*  BUN 25*  CREATININE 0.89  CALCIUM 9.5   Lipid Panel:  Recent Labs  Lab 07/29/21 0313  CHOL 141  TRIG 108  HDL 57  CHOLHDL 2.5  VLDL 22  LDLCALC 62   HgbA1c:  Recent Labs  Lab 07/29/21 0313  HGBA1C 6.6*   Urine Drug Screen:  Recent Labs  Lab 07/28/21 1515  LABOPIA NONE DETECTED  COCAINSCRNUR NONE DETECTED  LABBENZ NONE DETECTED  AMPHETMU NONE DETECTED  THCU NONE DETECTED  LABBARB NONE DETECTED    Alcohol Level  Recent Labs  Lab 07/28/21 1420  ETH 13*    IMAGING past 24 hours CT HEAD CODE STROKE WO CONTRAST  Result Date: 07/28/2021 CLINICAL DATA:  Code stroke.  Vertigo EXAM: CT HEAD WITHOUT CONTRAST TECHNIQUE: Contiguous axial images were obtained from the base of the skull through the vertex without intravenous contrast. COMPARISON:  CT head 08/20/2020, brain MRI 06/23/2020 FINDINGS: Brain: Small bilateral subdural fluid collections are seen measuring up to 3-4 mm in the coronal plane bilaterally, decreased in size compared to the most recent CT from 08/20/2020.  There is no evidence of new or acute intracranial hemorrhage, extra-axial fluid collection, or acute infarct. There is mild parenchymal volume loss and chronic white matter microangiopathy. The ventricles are normal in size. There is no mass lesion. There is no midline shift. Vascular: There is calcification of the bilateral cavernous ICAs. Skull: The patient is status post right parietal craniotomy and left parietal burr hole for evacuation of previously seen subdural hematomas. There is no calvarial fracture or suspicious osseous lesion. Sinuses/Orbits: There is an incompletely imaged mucous retention cyst in the left maxillary sinus. The other paranasal sinuses are clear. The globes and orbits are unremarkable. Other: None. ASPECTS [facility] Stroke Program Early CT Score) - Ganglionic level infarction (caudate, lentiform nuclei, internal capsule, insula, M1-M3 cortex): 7 - Supraganglionic infarction (M4-M6 cortex): 3 Total score (0-10 with 10 being normal): 10 IMPRESSION: 1. No acute intracranial hemorrhage or infarct. 2. ASPECTS is 10 3. 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 These results were called by telephone at the time of interpretation on 07/28/2021 at 2:48 pm to provider Dr Leonie Man, who verbally acknowledged these results. Electronically Signed   By: Valetta Mole  M.D.   On: 07/28/2021 14:59   CT ANGIO HEAD NECK W WO CM (CODE STROKE)  Result Date: 07/28/2021 CLINICAL DATA:  Nonspecific dizziness. EXAM: CT ANGIOGRAPHY HEAD AND NECK TECHNIQUE: Multidetector CT imaging of the head and neck was performed using the standard protocol during bolus administration of intravenous contrast. Multiplanar CT image reconstructions and MIPs were obtained to evaluate the vascular anatomy. Carotid stenosis measurements (when applicable) are obtained utilizing NASCET criteria, using the distal internal carotid diameter as the  denominator. CONTRAST:  52m OMNIPAQUE IOHEXOL 350 MG/ML SOLN COMPARISON:  Head CT earlier same day FINDINGS: CTA NECK FINDINGS Aortic arch: Aortic atherosclerosis. Right carotid system: Common carotid artery widely patent to the bifurcation. Ordinary atherosclerotic plaque at the carotid bifurcation. 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 ICA widely patent beyond that. Left carotid system: Common carotid artery widely patent to the bifurcation. Atherosclerotic calcification at the carotid bifurcation but no stenosis. Vertebral arteries: Both vertebral artery origins are widely patent. Both vertebral arteries appear normal through the cervical region to the foramen magnum. Skeleton: Ordinary cervical spondylosis. Other neck: No mass or lymphadenopathy. Upper chest: Question mild edema at the lung apices. Review of the MIP images confirms the above findings CTA HEAD FINDINGS Anterior circulation: Both internal carotid arteries widely patent through the skull base and siphon regions. Ordinary siphon atherosclerosis without stenosis. The anterior and middle cerebral vessels are normal. Posterior circulation: Both vertebral arteries widely patent to the basilar. No basilar stenosis. Posterior circulation branch vessels are normal. Venous sinuses: Patent and normal. Anatomic variants: None significant. Review of the MIP images confirms the above findings IMPRESSION: No intracranial large or medium vessel occlusion. Atherosclerotic change at both carotid bifurcations. 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 No stenosis on the left. Electronically Signed   By: MNelson ChimesM.D.   On: 07/28/2021 21:01    PHYSICAL EXAM Pleasant elderly Caucasian lady not in distress. . Afebrile. Head is nontraumatic. Neck is supple without bruit.    Cardiac exam no murmur or gallop. Lungs are clear to auscultation. Distal pulses are well felt.  Neurological Exam ;  Awake  Alert oriented x 3. Normal speech and language.eye movements  full without nystagmus.fundi were not visualized. Vision acuity and fields appear normal. Hearing is normal. Palatal movements are normal. Face symmetric. Tongue midline. Normal strength, tone, reflexes and coordination. Normal sensation. Gait deferred.  Baseline modified Rankin stroke scale 1 NIH stroke scale 0   ASSESSMENT/PLAN Ms. Amanda MUNNSis a 75y.o. female with history of bilateral subdural hematomas status postcraniotomy in August 2021 presenting with sudden onset of gait ataxia without accompanying symptoms.  Possibly posterior circulation TIA.  75year old female who presented after multiple falls. Imaging revealed bilateral subdural hematomas which required evacuation. The patient subsequently developed lower back and sacral pain. MRI of lumbar spine revealed subdural hemorrhage extending throughout the length of the visualized spine, mainly dorsal and most notable in the lower thoracic segments and at the L5-S1 level.  75year old lady with sudden onset of dizziness and gait ataxia of unclear etiology.  Vertebrobasilar ischemia and posterior circulation TIA versus small infarct is certainly a possibility.    Code Stroke HCT No acute intracranial hemorrhage or infarct. CTA head & neck  No intracranial large or medium vessel occlusion. Atherosclerotic change at both carotid bifurcations. 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 No stenosis on the left. MRI no acute infarct.  Stable 10 subdural fluid collections without mass-effect on  brain parenchyma.  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 Normal 2D Echo ejection fraction.  No cardiac source of embolism. LDL 62 HgbA1c 6.6 VTE prophylaxis -  not needed    Diet   Diet Heart Room service appropriate? Yes; Fluid consistency: Thin   aspirin 81 mg daily prior to admission, now on clopidogrel 75 mg daily.   Therapy recommendations:  Pending Disposition:  TBD  Hypertension Stable, not elevated on  admission Permissive hypertension (OK if < 220/120) but gradually normalize in 5-7 days Long-term BP goal normotensive Management per primary team   Hyperlipidemia Home meds:   LDL 62, goal < 70 High intensity statin: Lipitor '80mg'$  on board Continue statin at discharge  Diabetes type II/Pre-diabetes No home meds pre-diabetes A1c 5.6 on May 9th 2022 Home meds:   HgbA1c 6.6, goal < 7.0 Management per primary team  Bipolar Disease On home depakote   Headache Chronic headaches on topamax  ETOH abuse  History of ETOH use, alcohol level 13, advised to drink no more than 1 drink a day CIWA   Other Stroke Risk Factors Advanced Age >/= 55  Obesity, Body mass index is 30.65 kg/m., BMI >/= 30 associated with increased stroke risk, recommend weight loss, diet and exercise as appropriate  Migraines  Other Active Problems   Hospital day # 0  I have personally obtained history,examined this patient, reviewed notes, independently viewed imaging studies, participated in medical decision making and plan of care.ROS completed by me personally and pertinent positives fully documented  I have made any additions or clarifications directly to the above note. Agree with note above.  Recommend Plavix for secondary stroke prevention and will not do dual antiplatelet therapy given history of subdural hematoma.  Aggressive risk factor modification.  Patient will be discharged home.  Follow-up as an outpatient stroke clinic in 2 months.  Greater than 50% time during this 25-minute visit was spent on counseling and coordination of care and discussion with care team.  Discussed with patient, her husband and Dr. Broadus John. Stroke team will sign off.  Kindly call for questions Antony Contras, MD Medical Director [facility] Pager: [phone] 07/29/2021 3:28 PM   To contact Stroke Continuity provider, please refer to [email or web address] After hours, contact General Neurology

## 2021-07-29 NOTE — TOC Transition Note (Signed)
Transition of Care [facility]) - CM/SW Discharge Note   Patient Details  Name: MASHALA SARKA MRN: [id number] Date of Birth: May 16, 1946  [facility] CM/SW Contact:  Pollie Friar, RN Phone Number: 07/29/2021, 3:35 PM   Clinical Narrative:    Patient is discharging home with outpatient therapy arranged through [facility]. Information on the AVS.  Pt has recommended cane at home.  Pt denies issues with home medications or transportation. Pt has transport home and supervision at home.   Final next level of care: OP Rehab Barriers to Discharge: No Barriers Identified   Patient Goals and CMS Choice     Choice offered to / list presented to : Patient  Discharge Placement                       Discharge Plan and Services                                     Social Determinants of Health (SDOH) Interventions     Readmission Risk Interventions No flowsheet data found.

## 2021-07-30 NOTE — Discharge Summary (Addendum)
Physician Discharge Summary  Amanda Collier [id number] DOB: January 20, 1946 DOA: 07/28/2021  PCP: Leamon Arnt, MD  Admit date: 07/28/2021 Discharge date: 07/29/2021  Time spent: 35 minutes  Recommendations for Outpatient Follow-up:  Outpatient physical therapy PCP in 1 week Neurology Dr. Leonie Man in 4 to 6 weeks   Discharge Diagnoses:  Principal Problem:   Dizziness Active Problems:   Essential hypertension   Alcohol use   bipolar I disorder ([place])   Mixed hyperlipidemia   Discharge Condition: Stable  Diet recommendation: Low-sodium, heart healthy  Filed Weights   07/29/21 0119  Weight: 81 kg    History of present illness:  Amanda Collier is a 75 y.o. female with medical history significant of hypertension, hyperlipidemia, prediabetes, bipolar disorder, bilateral subdural hematomas following multiple falls in August 2021 status post bilateral craniotomy presented to the ED with dizziness, unsteady gait that started suddenly yesterday afternoon, was seen at [facility], blood work was unremarkable, CT head was negative and CTA head and neck was negative for large vessel occlusion, seen by teleneurology and admission recommended for further work-up  Hospital Course:   Acute onset dizziness, ataxia -Posterior circulation TIA suspected -Symptoms improved -Neurology consulted, MRI was negative for CVA, CTA head and neck was negative for large vessel occlusion, 2D echocardiogram was unremarkable -She is on aspirin and statin at baseline, neurology recommended discontinuing aspirin and starting Plavix 75 mg daily, did not recommend dual antiplatelet therapy at this time due to known history of subdural hematomas -LDL was 62, statin continued, hemoglobin A1c was 6.6  -Vestibular PT eval completed, this was unremarkable as well, outpatient physical therapy was recommended and this was set up at discharge  -Follow-up with  [facility] neurology in 6 weeks     Hypertension -Stable, antihypertensives resumedHistory of chronic headaches -Continue Topamax   H/o Borderline diabetes --HbA1c was 5.6 in 5/22   Dyslipidemia -Continue Lipitor   History of bipolar disorder -Continue Depakote  Discharge Exam: Vitals:   07/29/21 0402 07/29/21 1400  BP: 110/61 109/65  Pulse: (!) 53 (!) 56  Resp: 17 16  Temp: 97.9 F (36.6 C) 97.6 F (36.4 C)  SpO2: 96% 96%    General: AAOx3 Cardiovascular: S1S2/RRR Respiratory: CTAB  Discharge Instructions   Discharge Instructions     Ambulatory referral to Physical Therapy   Complete by: As directed       Allergies as of 07/29/2021       Reactions   Codeine Nausea Only   Lexapro [escitalopram Oxalate] Other (See Comments)   GI upset   Lisinopril Cough   Lotensin [benazepril Hcl] Other (See Comments)   Headaches        Medication List     STOP taking these medications    aspirin 81 MG EC tablet       TAKE these medications    atorvastatin 10 MG tablet Commonly known as: LIPITOR Take 1 tablet (10 mg total) by mouth daily.   clopidogrel 75 MG tablet Commonly known as: Plavix Take 1 tablet (75 mg total) by mouth daily.   divalproex 250 MG 24 hr tablet Commonly known as: DEPAKOTE ER Take 1-3 tablets (250-750 mg total) by mouth See admin instructions. Take 250 mg by mouth in the morning and 750 mg at bedtime   losartan 25 MG tablet Commonly known as: COZAAR Take 1 tablet (25 mg total) by mouth daily.   topiramate 50 MG tablet Commonly known as: Topamax Take 1 tablet (50 mg total) by mouth at  bedtime.   Vitamin D (Ergocalciferol) 1.25 MG (50000 UNIT) Caps capsule Commonly known as: DRISDOL Take 1 capsule (50,000 Units total) by mouth every Wednesday.   zolpidem 10 MG tablet Commonly known as: AMBIEN Take 10 mg by mouth at bedtime as needed.       Allergies  Allergen Reactions   Codeine Nausea Only   Lexapro [Escitalopram Oxalate] Other (See Comments)    GI  upset   Lisinopril Cough   Lotensin [Benazepril Hcl] Other (See Comments)    Headaches    Follow-up Information     Leamon Arnt, MD. Schedule an appointment as soon as possible for a visit in 1 week(s).   Specialty: Family Medicine Contact information: [place] [address] [phone]         Garvin Fila, MD. Go in 1 month(s).   Specialties: Neurology, Radiology Contact information: [address] [place] [address] [phone]         [facility]. Schedule an appointment as soon as possible for a visit.   Specialty: Rehabilitation Why: The outpatient therapy will contact you for the first appointment. If you have not heard from them on Monday please call to schedule Contact information: [address] [place], [place] [id number] [place] [phone]                 The results of significant diagnostics from this hospitalization (including imaging, microbiology, ancillary and laboratory) are listed below for reference.    Significant Diagnostic Studies: MR BRAIN WO CONTRAST  Result Date: 07/29/2021 CLINICAL DATA:  Stroke suspected. EXAM: MRI HEAD WITHOUT CONTRAST TECHNIQUE: Multiplanar, multiecho pulse sequences of the brain and surrounding structures were obtained without intravenous contrast. COMPARISON:  Head CT July 28, 2021 FINDINGS: Brain: No acute infarction, hemorrhage, hydrocephalus, mass lesion. Small residual bilateral subdural fluid collection, stable when compared to recent CT and decreased in size when compared to CT performed in October 2021. There is no mass effect on the brain parenchyma or midline shift. Scattered and confluent foci of T2 hyperintensity are seen the white matter of the cerebral hemispheres, nonspecific, most likely related to chronic small vessel ischemia. Moderate parenchymal volume loss. Vascular: Normal flow  voids. Skull and upper cervical spine: Postsurgical changes from prior bilateral craniotomies. Otherwise, normal marrow signal. Sinuses/Orbits: Negative. Other: None. IMPRESSION: 1. No acute intracranial abnormality. 2. Stable thin subdural fluid collections bilaterally without mass effect on the brain parenchyma or evidence of rebleed. 3. Moderate chronic microvascular ischemic changes of the white matter and parenchymal volume loss. Electronically Signed   By: Pedro Earls M.D.   On: 07/29/2021 10:16   ECHOCARDIOGRAM COMPLETE  Result Date: 07/29/2021    ECHOCARDIOGRAM REPORT   Patient Name:   Amanda Collier Date of Exam: 07/29/2021 Medical Rec #:  [id number]         Height:       64.0 in Accession #:    [id number]        Weight:       178.6 lb Date of Birth:  1946/05/08          BSA:          1.864 m Patient Age:    36 years          BP:           110/61 mmHg Patient Gender: F  HR:           60 bpm. Exam Location:  Inpatient Procedure: 2D Echo, Cardiac Doppler and Color Doppler Indications:    Stroke  History:        Patient has prior history of Echocardiogram examinations, most                 recent 08/10/2017. Risk Factors:Hypertension and Dyslipidemia.  Sonographer:    Clayton Lefort RDCS (AE) Referring Phys: [id number] [place]  1. Left ventricular ejection fraction, by estimation, is 55 to 60%. The left ventricle has normal function. The left ventricle has no regional wall motion abnormalities. There is mild concentric left ventricular hypertrophy. Left ventricular diastolic parameters are consistent with Grade I diastolic dysfunction (impaired relaxation).  2. Right ventricular systolic function is normal. The right ventricular size is normal.  3. The mitral valve is grossly normal. Trivial mitral valve regurgitation. No evidence of mitral stenosis.  4. The aortic valve is calcified but likely tri-leaflet. Aortic valve regurgitation is not visualized. Mild  aortic valve sclerosis is present, with no evidence of aortic valve stenosis.  5. The inferior vena cava is normal in size with greater than 50% respiratory variability, suggesting right atrial pressure of 3 mmHg. Comparison(s): A prior study was performed on 08/10/2017. No significant change from prior study. FINDINGS  Left Ventricle: Left ventricular ejection fraction, by estimation, is 55 to 60%. The left ventricle has normal function. The left ventricle has no regional wall motion abnormalities. The left ventricular internal cavity size was small. There is mild concentric left ventricular hypertrophy. Left ventricular diastolic parameters are consistent with Grade I diastolic dysfunction (impaired relaxation). Right Ventricle: The right ventricular size is normal. No increase in right ventricular wall thickness. Right ventricular systolic function is normal. Left Atrium: Left atrial size was normal in size. Right Atrium: Right atrial size was normal in size. Pericardium: There is no evidence of pericardial effusion. Mitral Valve: MVA 3.1 cm2 by continuity equation. The mitral valve is grossly normal. Mild mitral annular calcification. Trivial mitral valve regurgitation. No evidence of mitral valve stenosis. MV peak gradient, 4.2 mmHg. The mean mitral valve gradient is 1.0 mmHg. Tricuspid Valve: The tricuspid valve is normal in structure. Tricuspid valve regurgitation is not demonstrated. Aortic Valve: The aortic valve is calcified. Aortic valve regurgitation is not visualized. Mild aortic valve sclerosis is present, with no evidence of aortic valve stenosis. Aortic valve mean gradient measures 8.0 mmHg. Aortic valve peak gradient measures 14.7 mmHg. Aortic valve area, by VTI measures 1.76 cm. Pulmonic Valve: The pulmonic valve was not well visualized. Pulmonic valve regurgitation is not visualized. No evidence of pulmonic stenosis. Aorta: The aortic root is normal in size and structure. Venous: The inferior vena  cava is normal in size with greater than 50% respiratory variability, suggesting right atrial pressure of 3 mmHg. IAS/Shunts: The atrial septum is grossly normal.  LEFT VENTRICLE PLAX 2D LVIDd:         2.80 cm  Diastology LVIDs:         2.20 cm  LV e' medial:    9.57 cm/s LV PW:         1.40 cm  LV E/e' medial:  9.2 LV IVS:        1.70 cm  LV e' lateral:   8.27 cm/s LVOT diam:     2.10 cm  LV E/e' lateral: 10.6 LV SV:         77 LV SV Index:  41 LVOT Area:     3.46 cm  RIGHT VENTRICLE             IVC RV Basal diam:  3.00 cm     IVC diam: 1.30 cm RV S prime:     12.90 cm/s TAPSE (M-mode): 2.9 cm LEFT ATRIUM             Index       RIGHT ATRIUM           Index LA diam:        3.40 cm 1.82 cm/m  RA Area:     13.40 cm LA Vol (A2C):   49.0 ml 26.28 ml/m RA Volume:   29.70 ml  15.93 ml/m LA Vol (A4C):   44.6 ml 23.92 ml/m LA Biplane Vol: 47.8 ml 25.64 ml/m  AORTIC VALVE AV Area (Vmax):    1.75 cm AV Area (Vmean):   1.77 cm AV Area (VTI):     1.76 cm AV Vmax:           192.00 cm/s AV Vmean:          130.000 cm/s AV VTI:            0.439 m AV Peak Grad:      14.7 mmHg AV Mean Grad:      8.0 mmHg LVOT Vmax:         97.00 cm/s LVOT Vmean:        66.400 cm/s LVOT VTI:          0.223 m LVOT/AV VTI ratio: 0.51  AORTA Ao Root diam: 2.80 cm Ao Asc diam:  3.00 cm MITRAL VALVE MV Area (PHT): 2.77 cm    SHUNTS MV Area VTI:   2.97 cm    Systemic VTI:  0.22 m MV Peak grad:  4.2 mmHg    Systemic Diam: 2.10 cm MV Mean grad:  1.0 mmHg MV Vmax:       1.03 m/s MV Vmean:      53.4 cm/s MV Decel Time: 274 msec MV E velocity: 87.80 cm/s MV A velocity: 89.20 cm/s MV E/A ratio:  0.98 Rudean Haskell MD Electronically signed by Rudean Haskell MD Signature Date/Time: 07/29/2021/12:47:04 PM    Final    CT HEAD CODE STROKE WO CONTRAST  Result Date: 07/28/2021 CLINICAL DATA:  Code stroke.  Vertigo EXAM: CT HEAD WITHOUT CONTRAST TECHNIQUE: Contiguous axial images were obtained from the base of the skull through the vertex  without intravenous contrast. COMPARISON:  CT head 08/20/2020, brain MRI 06/23/2020 FINDINGS: Brain: Small bilateral subdural fluid collections are seen measuring up to 3-4 mm in the coronal plane bilaterally, decreased in size compared to the most recent CT from 08/20/2020. There is no evidence of new or acute intracranial hemorrhage, extra-axial fluid collection, or acute infarct. There is mild parenchymal volume loss and chronic white matter microangiopathy. The ventricles are normal in size. There is no mass lesion. There is no midline shift. Vascular: There is calcification of the bilateral cavernous ICAs. Skull: The patient is status post right parietal craniotomy and left parietal burr hole for evacuation of previously seen subdural hematomas. There is no calvarial fracture or suspicious osseous lesion. Sinuses/Orbits: There is an incompletely imaged mucous retention cyst in the left maxillary sinus. The other paranasal sinuses are clear. The globes and orbits are unremarkable. Other: None. ASPECTS [facility] Stroke Program Early CT Score) - Ganglionic level infarction (caudate, lentiform nuclei, internal capsule, insula, M1-M3 cortex): 7 - Supraganglionic infarction (  M4-M6 cortex): 3 Total score (0-10 with 10 being normal): 10 IMPRESSION: 1. No acute intracranial hemorrhage or infarct. 2. ASPECTS is 10 3. 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 These results were called by telephone at the time of interpretation on 07/28/2021 at 2:48 pm to provider Dr Leonie Man, who verbally acknowledged these results. Electronically Signed   By: Valetta Mole M.D.   On: 07/28/2021 14:59   CT ANGIO HEAD NECK W WO CM (CODE STROKE)  Result Date: 07/28/2021 CLINICAL DATA:  Nonspecific dizziness. EXAM: CT ANGIOGRAPHY HEAD AND NECK TECHNIQUE: Multidetector CT imaging of the head and neck was performed using the standard protocol during  bolus administration of intravenous contrast. Multiplanar CT image reconstructions and MIPs were obtained to evaluate the vascular anatomy. Carotid stenosis measurements (when applicable) are obtained utilizing NASCET criteria, using the distal internal carotid diameter as the denominator. CONTRAST:  37m OMNIPAQUE IOHEXOL 350 MG/ML SOLN COMPARISON:  Head CT earlier same day FINDINGS: CTA NECK FINDINGS Aortic arch: Aortic atherosclerosis. Right carotid system: Common carotid artery widely patent to the bifurcation. Ordinary atherosclerotic plaque at the carotid bifurcation. 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 ICA widely patent beyond that. Left carotid system: Common carotid artery widely patent to the bifurcation. Atherosclerotic calcification at the carotid bifurcation but no stenosis. Vertebral arteries: Both vertebral artery origins are widely patent. Both vertebral arteries appear normal through the cervical region to the foramen magnum. Skeleton: Ordinary cervical spondylosis. Other neck: No mass or lymphadenopathy. Upper chest: Question mild edema at the lung apices. Review of the MIP images confirms the above findings CTA HEAD FINDINGS Anterior circulation: Both internal carotid arteries widely patent through the skull base and siphon regions. Ordinary siphon atherosclerosis without stenosis. The anterior and middle cerebral vessels are normal. Posterior circulation: Both vertebral arteries widely patent to the basilar. No basilar stenosis. Posterior circulation branch vessels are normal. Venous sinuses: Patent and normal. Anatomic variants: None significant. Review of the MIP images confirms the above findings IMPRESSION: No intracranial large or medium vessel occlusion. Atherosclerotic change at both carotid bifurcations. 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 No stenosis on the left. Electronically Signed   By: MNelson ChimesM.D.   On: 07/28/2021 21:01    Microbiology: Recent Results (from the  past 240 hour(s))  Resp Panel by RT-PCR (Flu A&B, Covid) Nasopharyngeal Swab     Status: None   Collection Time: 07/28/21  2:20 PM   Specimen: Nasopharyngeal Swab; Nasopharyngeal(NP) swabs in vial transport medium  Result Value Ref Range Status   SARS Coronavirus 2 by RT PCR NEGATIVE NEGATIVE Final    Comment: (NOTE) SARS-CoV-2 target nucleic acids are NOT DETECTED.  The SARS-CoV-2 RNA is generally detectable in upper respiratory specimens during the acute phase of infection. The lowest concentration of SARS-CoV-2 viral copies this assay can detect is 138 copies/mL. A negative result does not preclude SARS-Cov-2 infection and should not be used as the sole basis for treatment or other patient management decisions. A negative result may occur with  improper specimen collection/handling, submission of specimen other than nasopharyngeal swab, presence of viral mutation(s) within the areas targeted by this assay, and inadequate number of viral copies(<138 copies/mL). A negative result must be combined with clinical observations, patient history, and epidemiological information. The expected result is Negative.  Fact Sheet for Patients:  hEntrepreneurPulse.com.au Fact Sheet for Healthcare Providers:  hIncredibleEmployment.be This test is no t  yet approved or cleared by the [place] and  has been authorized for detection and/or diagnosis of SARS-CoV-2 by FDA under an Emergency Use Authorization (EUA). This EUA will remain  in effect (meaning this test can be used) for the duration of the COVID-19 declaration under Section 564(b)(1) of the Act, 21 U.S.C.section 360bbb-3(b)(1), unless the authorization is terminated  or revoked sooner.       Influenza A by PCR NEGATIVE NEGATIVE Final   Influenza B by PCR NEGATIVE NEGATIVE Final    Comment: (NOTE) The Xpert Xpress SARS-CoV-2/FLU/RSV plus assay is intended as an aid in the diagnosis of  influenza from Nasopharyngeal swab specimens and should not be used as a sole basis for treatment. Nasal washings and aspirates are unacceptable for Xpert Xpress SARS-CoV-2/FLU/RSV testing.  Fact Sheet for Patients: EntrepreneurPulse.com.au  Fact Sheet for Healthcare Providers: IncredibleEmployment.be  This test is not yet approved or cleared by the [place] FDA and has been authorized for detection and/or diagnosis of SARS-CoV-2 by FDA under an Emergency Use Authorization (EUA). This EUA will remain in effect (meaning this test can be used) for the duration of the COVID-19 declaration under Section 564(b)(1) of the Act, 21 U.S.C. section 360bbb-3(b)(1), unless the authorization is terminated or revoked.  Performed at [facility], [address] [place], [place] [address]      Labs: Basic Metabolic Panel: Recent Labs  Lab 07/28/21 1420  NA 139  K 3.5  CL 103  CO2 25  GLUCOSE 147*  BUN 25*  CREATININE 0.89  CALCIUM 9.5   Liver Function Tests: Recent Labs  Lab 07/28/21 1420  AST 12*  ALT 11  ALKPHOS 78  BILITOT 0.5  PROT 6.9  ALBUMIN 4.4   No results for input(s): LIPASE, AMYLASE in the last 168 hours. No results for input(s): AMMONIA in the last 168 hours. CBC: Recent Labs  Lab 07/28/21 1420  WBC 3.6*  NEUTROABS 1.7  HGB 14.1  HCT 41.8  MCV 96.3  PLT 194   Cardiac Enzymes: No results for input(s): CKTOTAL, CKMB, CKMBINDEX, TROPONINI in the last 168 hours. BNP: BNP (last 3 results) No results for input(s): BNP in the last 8760 hours.  ProBNP (last 3 results) No results for input(s): PROBNP in the last 8760 hours.  CBG: Recent Labs  Lab 07/28/21 1423  GLUCAP 143*       Signed:  Domenic Polite MD.  [facility] 07/30/2021, 12:01 PM

## 2021-08-02 ENCOUNTER — Telehealth: Payer: Self-pay | Admitting: Obstetrics and Gynecology

## 2021-08-04 ENCOUNTER — Ambulatory Visit: Payer: Medicare Other | Attending: Internal Medicine | Admitting: Physical Therapy

## 2021-08-04 ENCOUNTER — Other Ambulatory Visit: Payer: Self-pay

## 2021-08-04 ENCOUNTER — Encounter: Payer: Self-pay | Admitting: Physical Therapy

## 2021-08-04 DIAGNOSIS — R2689 Other abnormalities of gait and mobility: Secondary | ICD-10-CM | POA: Diagnosis not present

## 2021-08-04 DIAGNOSIS — R2681 Unsteadiness on feet: Secondary | ICD-10-CM | POA: Insufficient documentation

## 2021-08-04 DIAGNOSIS — R42 Dizziness and giddiness: Secondary | ICD-10-CM | POA: Insufficient documentation

## 2021-08-04 NOTE — Therapy (Signed)
[facility] [address], [place], [place], [address] Phone: [phone]   Fax:  [phone]  Physical Therapy Evaluation  Patient Details  Name: Amanda Collier MRN: [id number] Date of Birth: 08/19/1946 Referring Provider (PT): Domenic Polite   Encounter Date: 08/04/2021   PT End of Session - 08/04/21 1150     Visit Number 1    Date for PT Re-Evaluation 09/29/21    Authorization Type MCR    Progress Note Due on Visit 10    PT Start Time 1150    PT Stop Time 1237    PT Time Calculation (min) 47 min    Activity Tolerance Patient tolerated treatment well    Behavior During Therapy [facility] for tasks assessed/performed             Past Medical History:  Diagnosis Date   Anxiety    Diabetes mellitus without complication ([place])    Headache syndrome 12/18/2016   Heart murmur 03/21/2021   Last echo 2018; calcified aortic valve w/o regurg or stenosis. Nl EF.    High blood pressure    Hypertension    followed by Dr Gillian Shields medications    Past Surgical History:  Procedure Laterality Date   CRANIOTOMY Bilateral 06/24/2020   Procedure: Bilateral craniotomy for evacuation of subdural hematoma;  Surgeon: Ashok Pall, MD;  Location: [place];  Service: Neurosurgery;  Laterality: Bilateral;   CRANIOTOMY Right 06/26/2020   Procedure: CRANIOTOMY HEMATOMA EVACUATION SUBDURAL;  Surgeon: Ashok Pall, MD;  Location: [place];  Service: Neurosurgery;  Laterality: Right;    There were no vitals filed for this visit.    Subjective Assessment - 08/04/21 1153     Subjective Patient had subdural hematoma last year. Never used AD due to vanity. House is set up with railings. 07/28/21 patient had incidence of dizziness and had subsequent testing all negative. last friday could not do tandem walking. Rolling over gets dizziness sometimes.    Pertinent History DM, HTN, anxiety    Currently in Pain? No/denies                 [facility] PT Assessment - 08/04/21 0001       Assessment   Medical Diagnosis dizziness    Referring Provider (PT) Domenic Polite    Onset Date/Surgical Date 07/28/21    Hand Dominance Right    Next MD Visit 08/08/21   with PCP     Precautions   Precautions None      Restrictions   Weight Bearing Restrictions No      Balance Screen   Has the patient fallen in the past 6 months No    Has the patient had a decrease in activity level because of a fear of falling?  No    Is the patient reluctant to leave their home because of a fear of falling?  No      Home Ecologist residence    Living Arrangements Spouse/significant other    Home Access Stairs to enter    Entrance Stairs-Number of Steps 3    Entrance Stairs-Rails Can reach both    [place] Two level      Prior Function   Level of [place] Retired    Leisure states she's a couch potato      ROM / Strength   AROM / PROM / Strength AROM;Strength      AROM   Overall  AROM Comments BLE WFL (see flexibility)      Strength   Overall Strength Comments Bil DF 5/5    Strength Assessment Site Hip;Knee    Right/Left Hip Right;Left    Right Hip Flexion 4/5    Right Hip Extension 4/5    Right Hip ABduction 4/5    Left Hip Flexion 4/5    Left Hip Extension 5/5    Left Hip ABduction 5/5    Right/Left Knee Right;Left    Right Knee Flexion 4/5    Right Knee Extension 5/5    Left Knee Flexion 4/5    Left Knee Extension 5/5      Flexibility   Soft Tissue Assessment /Muscle Length yes    Hamstrings mild tightness    Piriformis bil left> right    Obturator Internus bil gastroc tightness      Transfers   Comments dizziness with supine <> sit      Ambulation/Gait   Ambulation/Gait Yes    Ambulation/Gait Assistance 7: Independent    Ambulation Distance (Feet) 40 Feet    Assistive device None    Gait Pattern Step-through pattern;Decreased step length -  right;Decreased step length - left;Decreased stride length;Decreased hip/knee flexion - right;Decreased hip/knee flexion - left;Wide base of support    Ambulation Surface Level      Standardized Balance Assessment   Standardized Balance Assessment Berg Balance Test;Five Times Sit to Stand;Timed Up and Go Test    Five times sit to stand comments  10.05 sec      Berg Balance Test   Sit to Stand Able to stand without using hands and stabilize independently    Standing Unsupported Able to stand safely 2 minutes    Sitting with Back Unsupported but Feet Supported on Floor or Stool Able to sit safely and securely 2 minutes    Stand to Sit Sits safely with minimal use of hands    Transfers Able to transfer safely, minor use of hands    Standing Unsupported with Eyes Closed Able to stand 10 seconds safely    Standing Unsupported with Feet Together Able to place feet together independently and stand 1 minute safely    From Standing, Reach Forward with Outstretched Arm Can reach confidently >25 cm (10")    From Standing Position, Pick up Object from Floor Able to pick up shoe safely and easily    From Standing Position, Turn to Look Behind Over each Shoulder Looks behind from both sides and weight shifts well   tight bil   Turn 360 Degrees Able to turn 360 degrees safely in 4 seconds or less    Standing Unsupported, Alternately Place Feet on Step/Stool Able to complete >2 steps/needs minimal assist    Standing Unsupported, One Foot in Front Able to take small step independently and hold 30 seconds    Standing on One Leg Tries to lift leg/unable to hold 3 seconds but remains standing independently    Total Score 48      Timed Up and Go Test   Normal TUG (seconds) 14.93    TUG Comments norm 9.2 sec                        Objective measurements completed on examination: See above findings.                PT Education - 08/04/21 1301     Education Details HEP; discussed  slowing down transitions as orthostatic  hypotension may be the issue; importance of using AD    Person(s) Educated Patient    Methods Explanation;Demonstration;Handout    Comprehension Verbalized understanding;Returned demonstration              PT Short Term Goals - 08/04/21 1305       PT SHORT TERM GOAL #1   Title Ind and compliant with HEP    Baseline 3    Period Weeks    Status New      PT SHORT TERM GOAL #2   Title Patient to demo improved hip flexibility by being able to place her ankle on opposite knee to don/doff socks.    Time 4    Period Weeks    Status New      PT SHORT TERM GOAL #3   Title Patient able to demonstrate safe amb indoor/outdoor and on curbs/ramps using SPC.    Time 2    Period Weeks    Status New               PT Long Term Goals - 08/04/21 1307       PT LONG TERM GOAL #1   Title Ind with advanced HEP    Time 8    Period Weeks    Status New    Target Date 09/29/21      PT LONG TERM GOAL #2   Title Improved BERG score to 52/56 and TUG to 9.2 sec to decrease risk of falls.    Time 8    Period Weeks    Status New      PT LONG TERM GOAL #3   Title Patient to demonstrate improved gait pattern without AD and normal BOS and step and stride length.    Time 8    Period Weeks    Status New      PT LONG TERM GOAL #4   Title Patient to report decreased incidences of dizziness with transitions by 50% or more.    Time 8    Period Weeks    Status New                    Plan - 08/04/21 1243     Clinical Impression Statement Patient presents with c/o dizziness beginning 07/28/21. She was assessed in the hospital and cleared of CVA, vessel occlusion, vestibular and heart issues. She experienced a few instances of dizziness with sit <> supine and reports she gets dizzy rolling in bed sometimes. She would benefit from positional blood pressure assessment next visit. In the meantime, PT advised her to slow down her transitions. She  has higher level balance and gait deficits. Her BERG score is 48/56 indicating she should use a cane outdoors. She reports she is vain and does not want to use AD, but will if recommended. TUG  was 14.93 sec which also puts her at risk for falls for her age. She ambulates with a wide BOS with decreased hip/knee flexion. She is unable to perform SLS bil. She has LE strength deficits R > L and has tightness in her B gastrocs, HS and piriformis muscles. She will benefit from PT to address these deficits.    Personal Factors and Comorbidities Fitness;Comorbidity 3+    Comorbidities DM, HTN, anxiety    Stability/Clinical Decision Making Evolving/Moderate complexity    Clinical Decision Making Low    Rehab Potential Excellent    PT Frequency 2x / week    PT Duration 8 weeks  PT Treatment/Interventions ADLs/Self Care Home Management;Neuromuscular re-education;Balance training;Therapeutic exercise;Therapeutic activities;Stair training;Gait training;Patient/family education;Manual techniques;Vestibular    PT Next Visit Plan Assess positional BP as possible source of dizziness, amb with SPC (see STG), work on higher level balance, gait.    PT Home Exercise Plan [id number]    [id number] and Agree with Plan of Care Patient             Patient will benefit from skilled therapeutic intervention in order to improve the following deficits and impairments:  Abnormal gait, Impaired flexibility, Decreased strength, Decreased balance, Dizziness  Visit Diagnosis: Unsteadiness on feet  Dizziness and giddiness  Other abnormalities of gait and mobility     Problem List Patient Active Problem List   Diagnosis Date Noted   Dizziness 07/28/2021   Heart murmur 03/21/2021   Hx of traumatic brain injury 03/09/2021   Mixed hyperlipidemia 12/26/2020   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, [place], [place], [address] Phone: [phone]   Fax:  [phone]  Name: JALEXA PIFER MRN: [id number] Date of Birth: 12-21-1945

## 2021-08-04 NOTE — Patient Instructions (Signed)
Access Code: [email or web address] URL: [email or web address] Date: 08/04/2021 Prepared by: Almyra Free  Exercises Supine Piriformis Stretch with Leg Straight - 2 x daily - 7 x weekly - 1 sets - 3 reps - 30 sec hold Seated Piriformis Stretch - 3 x daily - 7 x weekly - 1 sets - 3 reps - 30-60 sec hold Gastroc Stretch on Wall - 2 x daily - 7 x weekly - 1 sets - 3 reps - 30-60 sec hold Standing Romberg to 1/2 Tandem Stance - 2 x daily - 7 x weekly - 1 sets - 5 reps - max hold

## 2021-08-05 ENCOUNTER — Telehealth: Payer: Self-pay

## 2021-08-05 NOTE — Telephone Encounter (Signed)
Pt called in stating that she had a prescription written for a Shingrix vaccine from Dr Jonni Sanger. Silas stated that she accidentally misplaced that paper. She wants to know if she can come to the office and pick up another one. Please Advise.

## 2021-08-05 NOTE — Telephone Encounter (Signed)
error 

## 2021-08-08 ENCOUNTER — Other Ambulatory Visit: Payer: Self-pay

## 2021-08-08 ENCOUNTER — Encounter: Payer: Self-pay | Admitting: Family Medicine

## 2021-08-08 ENCOUNTER — Ambulatory Visit (INDEPENDENT_AMBULATORY_CARE_PROVIDER_SITE_OTHER): Payer: Medicare Other | Admitting: Family Medicine

## 2021-08-08 VITALS — BP 122/68 | HR 87 | Temp 98.2°F | Ht 63.0 in | Wt 177.0 lb

## 2021-08-08 DIAGNOSIS — R7303 Prediabetes: Secondary | ICD-10-CM

## 2021-08-08 DIAGNOSIS — I10 Essential (primary) hypertension: Secondary | ICD-10-CM | POA: Diagnosis not present

## 2021-08-08 DIAGNOSIS — I82542 Chronic embolism and thrombosis of left tibial vein: Secondary | ICD-10-CM | POA: Diagnosis not present

## 2021-08-08 DIAGNOSIS — R42 Dizziness and giddiness: Secondary | ICD-10-CM | POA: Diagnosis not present

## 2021-08-08 DIAGNOSIS — E782 Mixed hyperlipidemia: Secondary | ICD-10-CM

## 2021-08-08 DIAGNOSIS — Z23 Encounter for immunization: Secondary | ICD-10-CM

## 2021-08-08 DIAGNOSIS — G459 Transient cerebral ischemic attack, unspecified: Secondary | ICD-10-CM | POA: Diagnosis not present

## 2021-08-08 DIAGNOSIS — R2681 Unsteadiness on feet: Secondary | ICD-10-CM

## 2021-08-08 DIAGNOSIS — Z87820 Personal history of traumatic brain injury: Secondary | ICD-10-CM | POA: Diagnosis not present

## 2021-08-08 MED ORDER — SHINGRIX 50 MCG/0.5ML IM SUSR: 0.5000 mL | Freq: Once | INTRAMUSCULAR | 0 refills | Status: AC

## 2021-08-08 NOTE — Telephone Encounter (Signed)
Will give to patient at her visit today

## 2021-08-08 NOTE — Patient Instructions (Signed)
Please return in 3 months to recheck sugars.   If you have any questions or concerns, please don't hesitate to send me a message via MyChart or call the office at [phone]. Thank you for visiting with [place] today! It's our pleasure caring for you.

## 2021-08-08 NOTE — Progress Notes (Signed)
Subjective  CC:  Chief Complaint  Patient presents with   [facility] follow up, 07/28/2021-07/29/2021    HPI: Amanda Collier is a 75 y.o. female who presents to the office today to address the problems listed above in the chief complaint. 75 yo admitted overnight on dates above due to dizziness. I have reviewed all records. Briefly, had acute episode of "feeling off balance" while at a workshop. No vertigo, palpitations or paresis. Evaluated for stroke given her h/o. MRI was unremarkable. Possible posterior circulation TIA. D/c'd on plavix x 3 weeks, then to restart asa. No recurrence of sxs. Nl echo and carotids. Now with ongoing PT. Had f/u with PT>  Discussed chronic alcohol use: wine 1 glass nightly for decades. No h/o alcoholism. Gait is unsteady though.   Prediabetes: we stopped metformin last visit. Most recent a1c was 6.6 last week.  HLD with ldl at goal.  BP is controlled well.   Assessment  1. TIA (transient ischemic attack)   2. Dizziness   3. Hx of traumatic brain injury   4. Mixed hyperlipidemia   5. 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 Essential hypertension   7. Prediabetes   8. Unsteady gait   9. Need for immunization against influenza      Plan  TIA:  plavix and secondary prevention. Avoid falls on plavix. Continue PT and f/u with neuro Alcohol use: educated. Recommend decreasing use. Diabetes: diet controlled. Improve diet, decrease wine and we will recheck in 3 months.  Bp and hld are controlled. Lipitor 10 and losartan 25 daily.  Flu shot today.   Follow up: 3 months for recheck  Visit date not found  Orders Placed This Encounter  Procedures   Flu Vaccine QUAD High Dose(Fluad)   Meds ordered this encounter  Medications   Zoster Vaccine Adjuvanted [facility]) injection    Sig: Inject 0.5 mLs into the muscle once for 1 dose. Please give 2nd dose 2-6 months after first dose    Dispense:  2  each    Refill:  0      I reviewed the patients updated PMH, FH, and SocHx.    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   clopidogrel (PLAVIX) 75 MG tablet Take 1 tablet (75 mg total) by mouth daily.   divalproex (DEPAKOTE ER) 250 MG 24 hr tablet Take 1-3 tablets (250-750 mg total) by mouth See admin instructions. Take 250 mg by mouth in the morning and 750 mg at bedtime   losartan (COZAAR) 25 MG tablet Take 1 tablet (25 mg total) by mouth daily.   topiramate (  TOPAMAX) 50 MG tablet Take 1 tablet (50 mg total) by mouth at bedtime.   Vitamin D, Ergocalciferol, (DRISDOL) 1.25 MG (50000 UNIT) CAPS capsule Take 1 capsule (50,000 Units total) by mouth every Wednesday.   zolpidem (AMBIEN) 10 MG tablet Take 10 mg by mouth at bedtime as needed.   [EXPIRED] Zoster Vaccine Adjuvanted [facility]) injection Inject 0.5 mLs into the muscle once for 1 dose. Please give 2nd dose 2-6 months after first dose    Allergies: Patient is allergic to codeine, lexapro [escitalopram oxalate], lisinopril, and lotensin [benazepril hcl]. Family History: Patient family history includes Heart attack in her maternal uncle. She was  adopted. Social History:  Patient  reports that she has never smoked. She has never used smokeless tobacco. She reports current alcohol use of about 7.0 standard drinks per week. She reports that she does not use drugs.  Review of Systems: Constitutional: Negative for fever malaise or anorexia Cardiovascular: negative for chest pain Respiratory: negative for SOB or persistent cough Gastrointestinal: negative for abdominal pain  Objective  Vitals: BP 122/68   Pulse 87   Temp 98.2 F (36.8 C) (Temporal)   Ht 5\' 3"  (1.6 m)   Wt 177 lb (80.3 kg)   LMP 11/14/2003   SpO2 96%   BMI 31.35 kg/m  General: no acute distress , A&Ox3 HEENT: [place]ER[place], conjunctiva normal, neck is supple Cardiovascular:  RRR without murmur or gallop.  Respiratory:  Good breath sounds bilaterally, CTAB with normal respiratory effort Gait: mild wide based gait and unsteady but no ataxia    Commons side effects, risks, benefits, and alternatives for medications and treatment plan prescribed today were discussed, and the patient expressed understanding of the given instructions. Patient is instructed to call or message via MyChart if he/she has any questions or concerns regarding our treatment plan. No barriers to understanding were identified. We discussed Red Flag symptoms and signs in detail. Patient expressed understanding regarding what to do in case of urgent or emergency type symptoms.  Medication list was reconciled, printed and provided to the patient in AVS. Patient instructions and summary information was reviewed with the patient as documented in the AVS. This note was prepared with assistance of Dragon voice recognition software. Occasional wrong-word or sound-a-like substitutions may have occurred due to the inherent limitations of voice recognition software  This visit occurred during the SARS-CoV-2 public health emergency.  Safety protocols were in place, including screening questions prior to the visit,  additional usage of staff PPE, and extensive cleaning of exam room while observing appropriate contact time as indicated for disinfecting solutions.

## 2021-08-09 ENCOUNTER — Other Ambulatory Visit: Payer: Self-pay | Admitting: Family Medicine

## 2021-08-10 ENCOUNTER — Ambulatory Visit: Payer: Medicare Other

## 2021-08-17 ENCOUNTER — Encounter: Payer: Self-pay | Admitting: Physical Therapy

## 2021-08-17 ENCOUNTER — Other Ambulatory Visit: Payer: Self-pay

## 2021-08-17 ENCOUNTER — Ambulatory Visit: Payer: Medicare Other | Attending: Internal Medicine | Admitting: Physical Therapy

## 2021-08-17 DIAGNOSIS — R42 Dizziness and giddiness: Secondary | ICD-10-CM | POA: Diagnosis not present

## 2021-08-17 DIAGNOSIS — R2689 Other abnormalities of gait and mobility: Secondary | ICD-10-CM | POA: Diagnosis not present

## 2021-08-17 DIAGNOSIS — R2681 Unsteadiness on feet: Secondary | ICD-10-CM | POA: Insufficient documentation

## 2021-08-17 DIAGNOSIS — R262 Difficulty in walking, not elsewhere classified: Secondary | ICD-10-CM | POA: Insufficient documentation

## 2021-08-17 NOTE — Therapy (Signed)
[facility] @ [place], [place], [address] Phone:     Fax:     Physical Therapy Treatment  Patient Details  Name: Amanda Collier MRN: [id number] Date of Birth: [birth year] Referring Provider (PT): Domenic Polite   Encounter Date: 08/17/2021   PT End of Session - 08/17/21 1111     Visit Number 2    Date for PT Re-Evaluation 09/29/21    Authorization Type MCR    Progress Note Due on Visit 10    PT Start Time 1111    PT Stop Time 1146    PT Time Calculation (min) 35 min    Activity Tolerance Patient tolerated treatment well    Behavior During Therapy [facility] for tasks assessed/performed             Past Medical History:  Diagnosis Date   Anxiety    Diabetes mellitus without complication ([place])    Headache syndrome 12/18/2016   Heart murmur 03/21/2021   Last echo 2018; calcified aortic valve w/o regurg or stenosis. Nl EF.    High blood pressure    Hypertension    followed by Dr Gillian Shields medications    Past Surgical History:  Procedure Laterality Date   CRANIOTOMY Bilateral 06/24/2020   Procedure: Bilateral craniotomy for evacuation of subdural hematoma;  Surgeon: Ashok Pall, MD;  Location: [place];  Service: Neurosurgery;  Laterality: Bilateral;   CRANIOTOMY Right 06/26/2020   Procedure: CRANIOTOMY HEMATOMA EVACUATION SUBDURAL;  Surgeon: Ashok Pall, MD;  Location: [place];  Service: Neurosurgery;  Laterality: Right;    There were no vitals filed for this visit.   Subjective Assessment - 08/17/21 1111     Subjective Patient states she hasn't been fully compliant with HEP due to having to go to a funeral. she is still getting dizziness. She notices it sometimes when she bends forward to tie her shoes.    Pertinent History DM, HTN, anxiety    Currently in Pain? No/denies                               [facility] Adult PT Treatment/Exercise - 08/17/21 0001        Ambulation/Gait   Ambulation/Gait Yes    Ambulation/Gait Assistance 6: Modified independent (Device/Increase time)    Ambulation Distance (Feet) 100 Feet    Assistive device Straight cane    Gait Pattern Step-through pattern;Decreased step length - right;Decreased step length - left;Decreased stride length;Wide base of support    Ambulation Surface Level    Gait velocity slow    Stairs Yes    Stairs Assistance 7: Independent    Stair Management Technique --   reciprocal up; step-to down with dizziness looking down   Number of Stairs 4    Height of Stairs 6    Gait Comments used mirror to show pt the difference in her gait pattern with and without the cane. She normalizes BOS immediately when using cane; right hip ER with gait, but pt able to correct with cues      Self-Care   Self-Care Other Self-Care Comments    Other Self-Care Comments  orthostatic BP assessed today due to dizziness complaints. BP assessed after 5 min supine; after standing 1 min and after standing 3 min; Dizziness reported with supine to sit transfer: Results: supine BP103/65 HR 72; 1 min BP 146/84 HR 92; 3 min BP 132/87 HR  81                       PT Short Term Goals - 08/17/21 1316       PT SHORT TERM GOAL #1   Title Ind and compliant with HEP    Status On-going      PT SHORT TERM GOAL #3   Title Patient able to demonstrate safe amb indoor/outdoor and on curbs/ramps using SPC.    Status On-going               PT Long Term Goals - 08/04/21 1307       PT LONG TERM GOAL #1   Title Ind with advanced HEP    Time 8    Period Weeks    Status New    Target Date 09/29/21      PT LONG TERM GOAL #2   Title Improved BERG score to 52/56 and TUG to 9.2 sec to decrease risk of falls.    Time 8    Period Weeks    Status New      PT LONG TERM GOAL #3   Title Patient to demonstrate improved gait pattern without AD and normal BOS and step and stride length.    Time 8    Period Weeks    Status  New      PT LONG TERM GOAL #4   Title Patient to report decreased incidences of dizziness with transitions by 50% or more.    Time 8    Period Weeks    Status New                   Plan - 08/17/21 1309     Clinical Impression Statement Patient late due change in clinic location. Orthostatic hypotension was assessed in supine and standing at 1 min and 3 min. BP does not indicate OHTN, however pt did experience dizziness with supine to sit transfer. She is seeing neurologist this Friday and taking results of assessment with her. We then worked on gait and stairs were assesed. Pt's gait significantly improved with SPC and she plans to acquire one. She did well with ascending stairs. Descending pt uses step to gait and reports dizziness with looking down.    PT Frequency 2x / week    PT Duration 8 weeks    PT Treatment/Interventions ADLs/Self Care Home Management;Neuromuscular re-education;Balance training;Therapeutic exercise;Therapeutic activities;Stair training;Gait training;Patient/family education;Manual techniques;Vestibular    PT Next Visit Plan what did neurologist say, work on higher level balance, gait,  amb with SPC outdoors with curbs/ramps (see STG).    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[place]) 12/24/2020   Personal history of colonic polyps 12/24/2020   Pure hypercholesterolemia 12/24/2020   Prediabetes 12/24/2020   Right-sided sensorineural hearing loss 09/29/2020   Vascular headache    Neurogenic  bladder    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

## 2021-08-22 ENCOUNTER — Ambulatory Visit (INDEPENDENT_AMBULATORY_CARE_PROVIDER_SITE_OTHER): Payer: Medicare Other | Admitting: Neurology

## 2021-08-22 ENCOUNTER — Encounter: Payer: Self-pay | Admitting: Rehabilitative and Restorative Service Providers"

## 2021-08-22 ENCOUNTER — Ambulatory Visit: Payer: Medicare Other | Admitting: Rehabilitative and Restorative Service Providers"

## 2021-08-22 ENCOUNTER — Encounter: Payer: Self-pay | Admitting: Neurology

## 2021-08-22 ENCOUNTER — Other Ambulatory Visit: Payer: Self-pay

## 2021-08-22 VITALS — BP 130/78 | HR 78 | Ht 63.0 in | Wt 179.2 lb

## 2021-08-22 DIAGNOSIS — G4489 Other headache syndrome: Secondary | ICD-10-CM

## 2021-08-22 DIAGNOSIS — R2681 Unsteadiness on feet: Secondary | ICD-10-CM

## 2021-08-22 DIAGNOSIS — R42 Dizziness and giddiness: Secondary | ICD-10-CM

## 2021-08-22 DIAGNOSIS — R2689 Other abnormalities of gait and mobility: Secondary | ICD-10-CM | POA: Diagnosis not present

## 2021-08-22 DIAGNOSIS — R262 Difficulty in walking, not elsewhere classified: Secondary | ICD-10-CM | POA: Diagnosis not present

## 2021-08-22 MED ORDER — TOPIRAMATE 50 MG PO TABS: 50.0000 mg | ORAL_TABLET | Freq: Every day | ORAL | 3 refills | Status: DC

## 2021-08-22 NOTE — Therapy (Signed)
[facility] @ [place], [place], [address] Phone: [phone]   Fax:  [phone]  Physical Therapy Treatment  Patient Details  Name: Amanda Collier MRN: [id number] Date of Birth: 05-Jun-1946 Referring Provider (PT): Amanda Collier   Encounter Date: 08/22/2021   PT End of Session - 08/22/21 1115     Visit Number 3    Date for PT Re-Evaluation 09/29/21    Authorization Type MCR    PT Start Time 1100    PT Stop Time 1140    PT Time Calculation (min) 40 min    Activity Tolerance Patient tolerated treatment well    Behavior During Therapy [facility] for tasks assessed/performed             Past Medical History:  Diagnosis Date   Anxiety    Diabetes mellitus without complication ([place])    Headache syndrome 12/18/2016   Heart murmur 03/21/2021   Last echo 2018; calcified aortic valve w/o regurg or stenosis. Nl EF.    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   Subjective Assessment - 08/22/21 1113     Subjective Pt reports that she has been more compliant with her HEP and she is borrowing a SPC to use for ambulation.    Patient Stated Goals To have better balance.    Currently in Pain? No/denies                               [facility] Adult PT Treatment/Exercise - 08/22/21 0001       High Level Balance   High Level Balance Activities Tandem walking    High Level Balance  Comments Reaching down to 2nd step to grab cone and back, reaching down to move cone to first step and back.  Standing on AirEx ball toss.      Exercises   Exercises Knee/Hip      Knee/Hip Exercises: Aerobic   Nustep L4 x6 min      Knee/Hip Exercises: Standing   Other Standing Knee Exercises Alt LE toe tap to cone and back 2x10B      Knee/Hip Exercises: Seated   Sit to Sand 10 reps;with UE support   on airex                      PT Short Term Goals - 08/22/21 1229       PT SHORT TERM GOAL #1   Title Ind and compliant with HEP    Status Partially Met      PT SHORT TERM GOAL #2   Title Patient to demo improved hip flexibility by being able to place her ankle on opposite knee to don/doff socks.    Status Partially Met               PT Long Term Goals - 08/22/21 1230       PT  LONG TERM GOAL #1   Title Ind with advanced HEP    Status On-going                   Plan - 08/22/21 1223     Clinical Impression Statement Amanda Collier arrived to visit using her [facility], but stated that she was not sure that it was properly adjusted.  This PT was able to adjust SPC to a more appropriate height and pt stated that she felt more stable. Pt worked in session with standing balance exercises and looking down and reaching to move cones.  Pt denied any dizziness with task.  However during tandem gait, pt states that she was trying not to look down to limit her risk of getting dizzy.  Pt required CGA during standing balance tasks and has most difficulty maintaining single leg stance on LLE compared to RLE.    PT Treatment/Interventions ADLs/Self Care Home Management;Neuromuscular re-education;Balance training;Therapeutic exercise;Therapeutic activities;Stair training;Gait training;Patient/family education;Manual techniques;Vestibular    PT Next Visit Plan what did neurologist say, work on higher level balance, gait,  amb with SPC outdoors with curbs/ramps (see STG).     Consulted and Agree with Plan of Care Patient             Patient will benefit from skilled therapeutic intervention in order to improve the following deficits and impairments:  Abnormal gait, Impaired flexibility, Decreased strength, Decreased balance, Dizziness  Visit Diagnosis: Unsteadiness on feet  Dizziness and giddiness  Other abnormalities of gait and mobility     Problem List Patient Active Problem List   Diagnosis Date Noted   Dizziness 07/28/2021   Heart murmur 03/21/2021   Hx of traumatic brain injury 03/09/2021   Mixed hyperlipidemia 12/26/2020   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[place]) 12/24/2020   Personal history of colonic polyps 12/24/2020   Pure hypercholesterolemia 12/24/2020   Prediabetes 12/24/2020   Right-sided sensorineural hearing loss 09/29/2020   Vascular headache    Neurogenic bladder    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

## 2021-08-22 NOTE — Progress Notes (Signed)
Reason for visit: History of headaches, dizziness  Amanda Collier is an 75 y.o. female  History of present illness:  Amanda Collier is a 75 year old right-handed white female with a history of headaches.  The patient gained excellent improvement with low-dose Topamax taking 50 mg daily.  She has not had any headaches since going on the medication a year ago.  The patient was seen in the hospital recently on 28 July 2021 with an episode of dizziness that occurred suddenly after she stood up.  She went to the hospital and was felt to have had a possible TIA event.  CT angiogram of the head and neck was unremarkable, MRI of the brain showed chronic mild to moderate small vessel disease but no acute changes were seen.  She was noted to have a hemoglobin A1c of 6.6, she currently is not on any medications for diabetes.  In the past, she has been treated for positional vertigo, but she did not describe this recent event as true vertigo.  She did not have any headache associated with the dizziness.  She reports no numbness or weakness of the face, arms, legs.  She is not having speech changes.  She comes to the office today for an evaluation.  Past Medical History:  Diagnosis Date   Anxiety    Diabetes mellitus without complication ([place])    Headache syndrome 12/18/2016   Heart murmur 03/21/2021   Last echo 2018; calcified aortic valve w/o regurg or stenosis. Nl EF.    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 unknown if correct/patient adopted    Social history:  reports that she has never smoked. She has never used smokeless tobacco. She reports current alcohol use of about 7.0 standard drinks per week. She reports that she does not use drugs.    Allergies  Allergen Reactions   Codeine Nausea Only   Lexapro [Escitalopram Oxalate] Other (See Comments)    GI upset   Lisinopril Cough   Lotensin [Benazepril Hcl] Other (See Comments)    Headaches    Medications:  Prior to Admission medications   Medication Sig Start Date End Date Taking? Authorizing Provider  atorvastatin (LIPITOR) 10 MG tablet TAKE ONE TABLET BY MOUTH DAILY 08/09/21  Yes Leamon Arnt, MD  clopidogrel (PLAVIX) 75 MG tablet Take 1 tablet (75 mg total) by mouth daily. 07/29/21 07/29/22 Yes Domenic Polite, MD  divalproex (DEPAKOTE ER) 250 MG 24 hr tablet Take 1-3 tablets (250-750 mg total) by mouth See admin instructions. Take 250 mg by mouth in the morning and 750 mg at bedtime 11/02/20  Yes Meredith Staggers, MD  losartan (COZAAR) 25 MG tablet Take 1 tablet (25 mg total) by mouth daily. 09/23/20  Yes Meredith Staggers, MD  topiramate (TOPAMAX) 50 MG tablet Take 1 tablet (50 mg total) by mouth at bedtime. 03/23/21  Yes Butler Denmark  J, NP  Vitamin D, Ergocalciferol, (DRISDOL) 1.25 MG (50000 UNIT) CAPS capsule Take 1 capsule (50,000 Units total) by mouth every Wednesday. 08/04/20  Yes Angiulli, Lavon Paganini, PA-C  zolpidem (AMBIEN) 10 MG tablet Take 10 mg by mouth at bedtime as needed. 01/12/21  Yes [provider]    ROS:  Out of a complete 14 system review of symptoms, the patient complains only of the following symptoms, and all other reviewed systems are negative.  Headache Dizziness Gait instability  Height 5\' 3"  (1.6 m), last menstrual period 11/14/2003.  Physical Exam  General: The patient is alert and cooperative at the time  of the examination.  Skin: No significant peripheral edema is noted.   Neurologic Exam  Mental status: The patient is alert and oriented x 3 at the time of the examination. The patient has apparent normal recent and remote memory, with an apparently normal attention span and concentration ability.   Cranial nerves: Facial symmetry is present. Speech is normal, no aphasia or dysarthria is noted. Extraocular movements are full. Visual fields are full.  Motor: The patient has good strength in all 4 extremities.  Sensory examination: Soft touch sensation is symmetric on the face, arms, and legs.  Coordination: The patient has good finger-nose-finger and heel-to-shin bilaterally.  Gait and station: The patient has a slightly wide-based gait, she uses a cane for ambulation.  Tandem gait is unsteady.  Romberg is negative.  Reflexes: Deep tendon reflexes are symmetric.   Assessment/Plan:  1.  History of headache  2.  Recent episode of dizziness, possible TIA  The patient will remain on Plavix at this point.  She will need treatment in the future for diabetes.  She does have a history of positional vertigo.  The patient has done quite well with the Topamax, we will continue this medication, a prescription was sent in.  She will follow-up here in 1 year, sooner if needed.  In the future she can be followed through Dr. Billey Gosling.  Amanda Alexanders MD 08/22/2021 3:31 PM  [facility] [address] [place], [place] [address]  Phone [phone] Fax [phone]

## 2021-08-24 ENCOUNTER — Other Ambulatory Visit: Payer: Self-pay

## 2021-08-24 ENCOUNTER — Ambulatory Visit: Payer: Medicare Other | Admitting: Rehabilitative and Restorative Service Providers"

## 2021-08-24 ENCOUNTER — Encounter: Payer: Self-pay | Admitting: Rehabilitative and Restorative Service Providers"

## 2021-08-24 DIAGNOSIS — R262 Difficulty in walking, not elsewhere classified: Secondary | ICD-10-CM

## 2021-08-24 DIAGNOSIS — R42 Dizziness and giddiness: Secondary | ICD-10-CM | POA: Diagnosis not present

## 2021-08-24 DIAGNOSIS — R2689 Other abnormalities of gait and mobility: Secondary | ICD-10-CM | POA: Diagnosis not present

## 2021-08-24 DIAGNOSIS — R2681 Unsteadiness on feet: Secondary | ICD-10-CM

## 2021-08-24 NOTE — Therapy (Addendum)
[facility] @ [place], [place], [address] Phone: [phone]   Fax:  [phone]  Physical Therapy Treatment  Patient Details  Name: Amanda Collier MRN: [id number] Date of Birth: May 21, 1946 Referring Provider (PT): Domenic Polite   Encounter Date: 08/24/2021   PT End of Session - 08/24/21 1106     Visit Number 4    Date for PT Re-Evaluation 09/29/21    Authorization Type MCR    PT Start Time 1100    PT Stop Time 1140    PT Time Calculation (min) 40 min    Activity Tolerance Patient tolerated treatment well    Behavior During Therapy [facility] for tasks assessed/performed             Past Medical History:  Diagnosis Date   Anxiety    Diabetes mellitus without complication ([place])    Headache syndrome 12/18/2016   Heart murmur 03/21/2021   Last echo 2018; calcified aortic valve w/o regurg or stenosis. Nl EF.    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   Subjective Assessment - 08/24/21 1105     Subjective I saw my Neurologist yesterday, they were pleased with my progress.    Patient Stated Goals To have better balance.    Currently in Pain? No/denies                               [facility] Adult PT Treatment/Exercise - 08/24/21 0001       Ambulation/Gait   Ambulation/Gait Yes    Ambulation/Gait Assistance 6: Modified  independent (Device/Increase time)    Ambulation Distance (Feet) 300 Feet    Assistive device Straight cane    Gait Pattern Step-through pattern;Decreased step length - right;Decreased step length - left;Decreased stride length;Wide base of support    Ambulation Surface Unlevel;Outdoor;Paved;Grass    Gait velocity slow    Curb 6: Modified independent (Device/increase time)      High Level Balance   High Level Balance Activities Tandem walking;Head turns;Backward walking    High Level Balance Comments Pencil push-ups for occulomotor in sitting x10, x10 in standing. Walking over small floor hurdles x4 laps, side stepping over small floor hurdles x4 laps.                       PT Short Term Goals - 08/24/21 1147       PT SHORT TERM GOAL #1   Title Ind and compliant with HEP    Status Achieved               PT Long Term Goals - 08/24/21 1148       PT LONG TERM GOAL #1   Title Ind with advanced HEP    Status On-going      PT LONG TERM GOAL #2   Title Improved BERG score  to 52/56 and TUG to 9.2 sec to decrease risk of falls.    Status On-going      PT LONG TERM GOAL #3   Title Patient to demonstrate improved gait pattern without AD and normal BOS and step and stride length.    Status On-going      PT LONG TERM GOAL #4   Title Patient to report decreased incidences of dizziness with transitions by 50% or more.    Status On-going                   Plan - 08/24/21 1143     Clinical Impression Statement Ms Sarrazin tolerates session well and without complaints of dizziness during session.  Pt requires close CGA with balance exercises throughout session. Pt able to ambulate outside with [facility] over various surfaces without a loss of balance mod I.  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    PT Treatment/Interventions ADLs/Self Care Home Management;Neuromuscular re-education;Balance training;Therapeutic  exercise;Therapeutic activities;Stair training;Gait training;Patient/family education;Manual techniques;Vestibular    PT Next Visit Plan work on higher level balance, gait,  amb with SPC outdoors with curbs/ramps (see STG).    Consulted and Agree with Plan of Care Patient             Patient will benefit from skilled therapeutic intervention in order to improve the following deficits and impairments:  Abnormal gait, Impaired flexibility, Decreased strength, Decreased balance, Dizziness  Visit Diagnosis: Unsteadiness on feet  Dizziness and giddiness  Other abnormalities of gait and mobility  Balance problem  Difficulty in walking, not elsewhere classified     Problem List Patient Active Problem List   Diagnosis Date Noted   Dizziness 07/28/2021   Heart murmur 03/21/2021   Hx of traumatic brain injury 03/09/2021   Mixed hyperlipidemia 12/26/2020   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[place]) 12/24/2020   Personal history of colonic polyps 12/24/2020   Pure hypercholesterolemia 12/24/2020   Prediabetes 12/24/2020   Right-sided sensorineural hearing loss 09/29/2020   Vascular headache    Neurogenic bladder    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   Patient agrees to discharge. Patient goals were partially met. Patient is being discharged due to not returning since the last visit.   Amanda Collier, PT, DPT 08/24/2021, 11:49 AM  [facility] @ [place], [place], [address] Phone: [phone]   Fax:  [phone]  Name: Amanda Collier MRN:  [id number] Date of Birth: 1946/07/02

## 2021-08-25 ENCOUNTER — Telehealth: Payer: Self-pay

## 2021-08-25 NOTE — Telephone Encounter (Signed)
These are already reflected in the chart

## 2021-08-25 NOTE — Telephone Encounter (Signed)
FYI  Patient states she received shingrix from [facility].    One was in May of 2021 and the other was in December of 2021.

## 2021-09-25 DIAGNOSIS — Z23 Encounter for immunization: Secondary | ICD-10-CM | POA: Diagnosis not present

## 2021-09-27 ENCOUNTER — Telehealth: Payer: Self-pay

## 2021-09-27 MED ORDER — CLOPIDOGREL BISULFATE 75 MG PO TABS: 75.0000 mg | ORAL_TABLET | Freq: Every day | ORAL | 1 refills | Status: DC

## 2021-09-27 NOTE — Telephone Encounter (Signed)
Medication has been sent in 

## 2021-09-27 NOTE — Telephone Encounter (Signed)
..   Encourage patient to contact the pharmacy for refills or they can request refills through [facility]:  08/08/21  NEXT APPOINTMENT DATE: 11/21/21  MEDICATION:  Clotidogrel  Is the patient out of medication? Is leaving to go out of town tomorrow to take care of her sister.  Would like to be filled today.  [facility]: [facility] [address]   Let patient know to contact pharmacy at the end of the day to make sure medication is ready.  Please notify patient to allow 48-72 hours to process  CLINICAL FILLS OUT ALL BELOW:   LAST REFILL:  QTY:  REFILL DATE:    OTHER COMMENTS:   This was a script given to her at the ED on 07/28/21.  Seen Dr. Jonni Sanger on 08/08/21.  Patient states she was instructed at the ED to stop taking aspirin.  States she has started taking Advil for arthritis pain in shoulders and ankles.  Would like to know if it is ok to take Advil?  Also, is going to get [employer] to send over Vaccination record of 4th phizer booster received on 09/25/2021 to be documented in her chart.   Okay for refill?  Please advise

## 2021-09-29 ENCOUNTER — Telehealth: Payer: Self-pay

## 2021-09-29 NOTE — Telephone Encounter (Signed)
Pt called regarding her 4th Covid Booster. Pt stated that it was [employer]. She stated that the numbers on the card are: [id number] and given on 09/25/21.     **Pt also wants to know what she should take for arthritis. She stated that she wants to know is she should take Advil or Tylenol extra strength. Please Advise.

## 2021-10-03 NOTE — Telephone Encounter (Signed)
Please advise 

## 2021-10-03 NOTE — Telephone Encounter (Signed)
LVM for patient to return call. 

## 2021-10-04 NOTE — Telephone Encounter (Signed)
Patient called back, gave message below.Patient states she understands.

## 2021-10-24 ENCOUNTER — Telehealth: Payer: Self-pay

## 2021-10-24 ENCOUNTER — Other Ambulatory Visit: Payer: Self-pay

## 2021-10-24 MED ORDER — VITAMIN D (ERGOCALCIFEROL) 1.25 MG (50000 UNIT) PO CAPS
50000.0000 [IU] | ORAL_CAPSULE | ORAL | 0 refills | Status: DC
Start: 2021-10-26 — End: 2021-11-29

## 2021-10-24 NOTE — Telephone Encounter (Signed)
Medication has been filled 

## 2021-10-24 NOTE — Telephone Encounter (Signed)
LAST APPOINTMENT DATE: 08/08/21  NEXT APPOINTMENT DATE: 11/21/21  MEDICATION:Vitamin D, Ergocalciferol, (DRISDOL) 1.25 MG (50000 UNIT) CAPS capsule  PHARMACY: [facility] [id number] - [place], [place] - [address]

## 2021-10-25 ENCOUNTER — Ambulatory Visit: Payer: Medicare Other | Admitting: Physical Therapy

## 2021-10-31 ENCOUNTER — Other Ambulatory Visit: Payer: Self-pay

## 2021-10-31 ENCOUNTER — Ambulatory Visit: Payer: Medicare Other | Attending: Obstetrics and Gynecology | Admitting: Physical Therapy

## 2021-10-31 ENCOUNTER — Encounter: Payer: Self-pay | Admitting: Physical Therapy

## 2021-10-31 DIAGNOSIS — R269 Unspecified abnormalities of gait and mobility: Secondary | ICD-10-CM | POA: Diagnosis not present

## 2021-10-31 DIAGNOSIS — M6281 Muscle weakness (generalized): Secondary | ICD-10-CM | POA: Diagnosis not present

## 2021-10-31 DIAGNOSIS — R293 Abnormal posture: Secondary | ICD-10-CM | POA: Diagnosis not present

## 2021-10-31 DIAGNOSIS — R279 Unspecified lack of coordination: Secondary | ICD-10-CM

## 2021-10-31 NOTE — Therapy (Signed)
[facility] @ [address] [place], [place], [address] Phone: [phone]   Fax:  [phone]  Physical Therapy Evaluation  Patient Details  Name: Amanda Collier MRN: [id number] Date of Birth: 1946-07-06 Referring Provider (PT): Jaquita Folds, MD   Encounter Date: 10/31/2021   PT End of Session - 10/31/21 1219     Visit Number 1    Date for PT Re-Evaluation 01/29/22    Authorization Type Medicare/ [employer]    Progress Note Due on Visit 10    PT Start Time 1145    PT Stop Time 1225    PT Time Calculation (min) 40 min    Activity Tolerance Patient tolerated treatment well    Behavior During Therapy [facility] for tasks assessed/performed             Past Medical History:  Diagnosis Date   Anxiety    Diabetes mellitus without complication ([place])    Headache syndrome 12/18/2016   Heart murmur 03/21/2021   Last echo 2018; calcified aortic valve w/o regurg or stenosis. Nl EF.    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    Subjective Assessment - 10/31/21 1147     Subjective Pt reports she has been having urinary incontinence and urgency for about a year now, wears two panty liners at all times night and day and does need to change them at least once per day sometimes. Pt reports she doesn't know how often she urinates but doesn't think it is not quicker than every  2 hours. Pt leaks urine with strong urge only.    Pertinent History sept 15 2021 TIA    How long can you sit comfortably? no limits    How long can you stand comfortably? no limits    How long can you walk comfortably? no limits    Patient Stated Goals to have less leakage and urgency    Currently in Pain? No/denies                [facility] PT Assessment - 10/31/21 0001       Assessment   Medical Diagnosis N32.81 (ICD-10-CM) - Overactive bladder    Referring Provider (PT) Jaquita Folds, MD    Onset Date/Surgical Date --   1 year   Prior Therapy Yes - recently for dizzines      Precautions   Precautions None      Restrictions   Weight Bearing Restrictions No      Balance Screen   Has the patient fallen in the past 6 months No    Has the patient had a decrease in activity level because of a fear of falling?  No    Is the patient reluctant to leave their home because of a fear of falling?  No      Home Ecologist residence    Living Arrangements Spouse/significant other  Prior Function   Level of Independence Independent    Vocation Retired      Charity fundraiser Status Within Functional Limits for tasks assessed      Sensation   Light Touch Appears Intact      Coordination   Gross Motor Movements are Fluid and Coordinated Yes    Fine Motor Movements are Fluid and Coordinated Yes      Posture/Postural Control   Posture/Postural Control Postural limitations    Postural Limitations Rounded Shoulders;Posterior pelvic tilt      ROM / Strength   AROM / PROM / Strength AROM;Strength      AROM   Overall AROM Comments thoracic and lumbar spine limited in all directions by 75%      Strength   Overall Strength Comments bil hips grossly 3/5      Flexibility   Soft Tissue Assessment /Muscle Length yes   bil adductors limited by 50% and bil hamstrings by 75%     Palpation   Palpation comment no TTP with external  assessment                        Objective measurements completed on examination: See above findings.     Pelvic Floor Special Questions - 10/31/21 0001     Prior Pelvic/Prostate Exam Yes   normal   Are you Pregnant or attempting pregnancy? No    Prior Pregnancies Yes    Number of Pregnancies 2    Number of Vaginal Deliveries 2    Any difficulty with labor and deliveries No    Currently Sexually Active Yes    Is this Painful No    History of sexually transmitted disease No    Marinoff Scale no problems    Urinary Leakage Yes    How often daily at least once    Pad use yes wears 2 panty liners day and night, chaning at least once    Activities that cause leaking With strong urge    Urinary urgency Yes    Urinary frequency no    Fecal incontinence No    Fluid intake not a lot in general    Caffeine beverages defac tea sometimes    Falling out feeling (prolapse) No    External Perineal Exam WFL, mild dryness noted    Pelvic Floor Internal Exam patient identified and patient confirms consent for PT to perform internal soft tissue work and muscle strength and integrity assessment    Exam Type Vaginal    Sensation pt reports she is unable to feel/tell if she is doing a kegel but is able to feel throughout internally and externally for assessment    Palpation no TTP    Strength fair squeeze, definite lift    Strength # of reps 5    Strength # of seconds 3    Tone fair                       PT Education - 10/31/21 1219     Education Details Pt educated on exam findings, POC, bladder irritants, urge drill and bladder diary.    Person(s) Educated Patient    Methods Explanation;Demonstration;Tactile cues;Verbal cues;Handout    Comprehension Returned demonstration;Verbalized understanding              PT Short Term Goals - 10/31/21 1231       PT SHORT TERM GOAL #1   Title Pt  to be I with HEP    Time 4    Status New    Target Date 11/28/21       PT SHORT TERM GOAL #2   Title Pt to demonstrate at least 4/5 pelvic floor strength and ability to hold for 5s for decreased urinary incontinence symptoms    Time 4    Period Weeks    Status New    Target Date 11/28/21               PT Long Term Goals - 10/31/21 1248       PT LONG TERM GOAL #1   Title pt to be Ind with advanced HEP    Time 3    Period Months    Status New    Target Date 01/29/22      PT LONG TERM GOAL #2   Title Pt to demonstrate at least 4/5 pelvic floor strength and ability to hold for 8s for decreased urinary incontinence symptoms    Time 3    Period Months    Status New    Target Date 01/29/22      PT LONG TERM GOAL #3   Title Pt to demonstrate at least 4/5 bil hip strength for improved pelvic stability and decreased incontinence symptoms    Time 3    Period Months    Status New    Target Date 01/29/22      PT LONG TERM GOAL #4   Title pt to rpeort no more than 1 leak per week for improved QOL    Time 3    Period Months    Status New    Target Date 01/29/22                    Plan - 10/31/21 1220     Clinical Impression Statement Pt is 75yo female presenting to clinic with one year history of urinary incontinence with strong urge. Pt reports she doesn't keep track of how often she urinates but doesn't think it is quicker than every 2 hours, only leaks urine with strong urge and can't get to the bathroom quickly enough. Pt reports she did have TIA in 07/2020 and now requires cane. Pt found to have decreased balance, abnormal gait pattern with decreased stride length and bil step height and decreased cadence, decreased spinal mobility in all directions, decreased hip flexiblity, decreased hip strength bil, and pt did consent to internal vaginal pelvic floor assessment and found to have decreased strength, endurance and coordination. Pt tolerated session well but did need cues to complete contraction at pelvic floor and extra time to  complete. Pt denied pain throughout. Pt would benefit from additional PT to further address needs.    Personal Factors and Comorbidities Age;Time since onset of injury/illness/exacerbation;Comorbidity 1    Comorbidities recent TIA, x2 vaginal births    Examination-Activity Limitations Locomotion Level;Continence;Hygiene/Grooming;Stairs    Examination-Participation Restrictions Community Activity;Shop    Stability/Clinical Decision Making Stable/Uncomplicated    Clinical Decision Making Low    Rehab Potential Good    PT Frequency 1x / week    PT Duration Other (comment)   10 visits   PT Treatment/Interventions ADLs/Self Care Home Management;Functional mobility training;Therapeutic activities;Therapeutic exercise;Neuromuscular re-education;Manual techniques;Patient/family education;Passive range of motion;Energy conservation    PT Next Visit Plan go over all handouts, give HEP for pelvic contractions    Consulted and Agree with Plan of Care Patient  Patient will benefit from skilled therapeutic intervention in order to improve the following deficits and impairments:  Decreased coordination, Decreased endurance, Difficulty walking, Impaired flexibility, Improper body mechanics, Postural dysfunction, Decreased strength, Decreased mobility, Decreased balance  Visit Diagnosis: Muscle weakness (generalized) - Plan: PT plan of care cert/re-cert  Lack of coordination - Plan: PT plan of care cert/re-cert  Abnormality of gait and mobility - Plan: PT plan of care cert/re-cert  Abnormal posture - Plan: PT plan of care cert/re-cert     Problem List Patient Active Problem List   Diagnosis Date Noted   Dizziness 07/28/2021   Heart murmur 03/21/2021   Hx of traumatic brain injury 03/09/2021   Mixed hyperlipidemia 12/26/2020   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[place]) 12/24/2020   Personal history of colonic polyps 12/24/2020   Pure hypercholesterolemia 12/24/2020   Prediabetes 12/24/2020    Right-sided sensorineural hearing loss 09/29/2020   Vascular headache    Neurogenic bladder    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

## 2021-10-31 NOTE — Patient Instructions (Signed)
Bladder Irritants  Certain foods and beverages can be irritating to the bladder.  Avoiding these irritants may decrease your symptoms of urinary urgency, frequency or bladder pain.  Even reducing your intake can help with your symptoms.  Not everyone is sensitive to all bladder irritants, so you may consider focusing on one irritant at a time, removing or reducing your intake of that irritant for 7-10 days to see if this change helps your symptoms.  Water intake is also very important.  Below is a list of bladder irritants.  Drinks: alcohol, carbonated beverages, caffeinated beverages such as coffee and tea, drinks with artificial sweeteners, citrus juices, apple juice, tomato juice  Foods: tomatoes and tomato based foods, spicy food, sugar and artificial sweeteners, vinegar, chocolate, raw onion, apples, citrus fruits, pineapple, cranberries, tomatoes, strawberries, plums, peaches, cantaloupe  Other: acidic urine (too concentrated) - see water intake info below  Substitutes you can try that are NOT irritating to the bladder: cooked onion, pears, papayas, sun-brewed decaf teas, watermelons, non-citrus herbal teas, apricots, kava and low-acid instant drinks (Postum).    WATER INTAKE: Remember to drink lots of water (aim for fluid intake of half your body weight with 2/3 of fluids being water).  You may be limiting fluids due to fear of leakage, but this can actually worsen urgency symptoms due to highly concentrated urine.  Water helps balance the pH of your urine so it doesn't become too acidic - acidic urine is a bladder irritant!  Urge Incontinence  Ideal urination frequency is every 2-4 wakeful hours, which equates to 5-8 times within a 24-hour period.   Urge incontinence is leakage that occurs when the bladder muscle contracts, creating a sudden need to go before getting to the bathroom.   Going too often when your bladder isn't actually full can disrupt the body's automatic signals to  store and hold urine longer, which will increase urgency/frequency.  In this case, the bladder "is running the show" and strategies can be learned to retrain this pattern.   One should be able to control the first urge to urinate, at around 150mL.  The bladder can hold up to a "grande latte," or 400mL. To help you gain control, practice the Urge Drill below when urgency strikes.  This drill will help retrain your bladder signals and allow you to store and hold urine longer.  The overall goal is to stretch out your time between voids to reach a more manageable voiding schedule.    Practice your "quick flicks" often throughout the day (each waking hour) even when you don't need feel the urge to go.  This will help strengthen your pelvic floor muscles, making them more effective in controlling leakage.  Urge Drill  When you feel an urge to go, follow these steps to regain control: Stop what you are doing and be still Take one deep breath, directing your air into your abdomen Think an affirming thought, such as "I've got this." Do 5 quick flicks of your pelvic floor Walk with control to the bathroom to void, or delay voiding   Relaxation Exercises with the Urge to Void   When you experience an urge to void:  FIRST  Stop and stand very still    Sit down if you can    Don't move    You need to stay very still to maintain control  SECOND Squeeze your pelvic floor muscles 5 times, like a quick flick, to keep from leaking  THIRD Relax  Take   a deep breath and then let it out  Try to make the urge go away by using relaxation and visualization techniques  FINALLY When you feel the urge go away somewhat, walk normally to the bathroom.   If the urge gets suddenly stronger on the way, you may stop again and relax to regain control.    

## 2021-11-01 ENCOUNTER — Ambulatory Visit: Payer: Medicare Other | Admitting: Obstetrics and Gynecology

## 2021-11-03 ENCOUNTER — Telehealth (INDEPENDENT_AMBULATORY_CARE_PROVIDER_SITE_OTHER): Payer: Medicare Other | Admitting: Physician Assistant

## 2021-11-03 VITALS — Ht 63.0 in

## 2021-11-03 DIAGNOSIS — U071 COVID-19: Secondary | ICD-10-CM

## 2021-11-03 MED ORDER — MOLNUPIRAVIR EUA 200MG CAPSULE: 4.0000 | ORAL_CAPSULE | Freq: Two times a day (BID) | ORAL | 0 refills | Status: AC

## 2021-11-03 NOTE — Progress Notes (Signed)
Virtual Visit via Telephone Note  I connected with Amanda Collier on 11/03/21 at  8:30 AM EST by telephone and verified that I am speaking with the correct person using two identifiers.  Location: Patient: Home Provider: Therapist, music at [employer]   I discussed the limitations, risks, security and privacy concerns of performing an evaluation and management service by telephone and the availability of in person appointments. I also discussed with the patient that there may be a patient responsible charge related to this service. The patient expressed understanding and agreed to proceed.   History of Present Illness:  Chief complaint: COVID-19 positive Symptom onset: Yesterday, 11/02/21 Pertinent positives: Hoarse, slight cough, runny nose Pertinent negatives: ST, SOB, CP, body aches, fever, chills Treatments tried: Nothing so far Vaccine status: COVID-19 boosters all UTD Sick exposure: Recently at a party, unsure of any sick contacts; husband started with symptoms yesterday & tested positive    Observations/Objective: Speaking clearly without any signs of distress. No pauses for breath.  Assessment and Plan:  1. COVID-19 Diagnosis confirmed via home antigen test. We discussed current algorithm recommendations for prescribing outpatient antivirals.  As the patient is over the age of 60 and within 5 day window onset of symptoms, she could consider antiviral treatment.  Risks versus benefits discussed.  Agreed on Molnupiravir prescription, possible SE discussed. Advised self-isolation at home for the next 5 days and then masking around others for at least an additional 5 days.  Treat supportively at this time as well including sleeping prone, deep breathing exercises, pushing fluids, walking every few hours, vitamins C and D, and Tylenol or ibuprofen as needed.  The patient understands that COVID-19 illness can wax and wane.  Should the symptoms acutely worsen or patient  starts to experience sudden shortness of breath, chest pain, severe weakness, the patient will go straight to the emergency department.  Also advised home pulse oximetry monitoring and for any reading consistently under 92%, should also report to the emergency department.  The patient will continue to keep [place] updated.    Follow Up Instructions:    I discussed the assessment and treatment plan with the patient. The patient was provided an opportunity to ask questions and all were answered. The patient agreed with the plan and demonstrated an understanding of the instructions.   The patient was advised to call back or seek an in-person evaluation if the symptoms worsen or if the condition fails to improve as anticipated.  I provided 9 minutes 39 sec of non-face-to-face time during this encounter.   Emric Kowalewski M Gregoire Bennis, PA-C

## 2021-11-21 ENCOUNTER — Ambulatory Visit: Payer: Medicare Other | Admitting: Family Medicine

## 2021-11-28 ENCOUNTER — Other Ambulatory Visit: Payer: Self-pay | Admitting: Family Medicine

## 2021-11-29 ENCOUNTER — Telehealth: Payer: Self-pay | Admitting: Family Medicine

## 2021-11-29 ENCOUNTER — Other Ambulatory Visit: Payer: Self-pay

## 2021-11-29 MED ORDER — VITAMIN D (ERGOCALCIFEROL) 1.25 MG (50000 UNIT) PO CAPS: 50000.0000 [IU] | ORAL_CAPSULE | ORAL | 0 refills | Status: DC

## 2021-11-29 NOTE — Telephone Encounter (Signed)
Copied from [place] [phone]. Topic: Medicare AWV >> Nov 29, 2021 10:20 AM Harris-Coley, Hannah Beat wrote: Reason for CRM: Left message for patient to schedule Annual Wellness Visit.  Please schedule with Nurse Health Advisor Charlott Rakes, RN at [facility].  Please call [phone] ask for [facility]

## 2021-12-05 ENCOUNTER — Other Ambulatory Visit: Payer: Self-pay

## 2021-12-05 ENCOUNTER — Other Ambulatory Visit: Payer: Self-pay | Admitting: Family Medicine

## 2021-12-05 MED ORDER — CLOPIDOGREL BISULFATE 75 MG PO TABS: 75.0000 mg | ORAL_TABLET | Freq: Every day | ORAL | 1 refills | Status: DC

## 2021-12-06 ENCOUNTER — Ambulatory Visit: Payer: Medicare Other | Admitting: Obstetrics and Gynecology

## 2021-12-12 ENCOUNTER — Telehealth: Payer: Self-pay

## 2021-12-12 ENCOUNTER — Other Ambulatory Visit: Payer: Self-pay

## 2021-12-12 ENCOUNTER — Ambulatory Visit (INDEPENDENT_AMBULATORY_CARE_PROVIDER_SITE_OTHER): Payer: Medicare Other

## 2021-12-12 DIAGNOSIS — Z0000 Encounter for general adult medical examination without abnormal findings: Secondary | ICD-10-CM

## 2021-12-12 NOTE — Telephone Encounter (Signed)
Patient notified no need for fasting will check AIC

## 2021-12-12 NOTE — Patient Instructions (Signed)
Amanda Collier , Thank you for taking time to come for your Medicare Wellness Visit. I appreciate your ongoing commitment to your health goals. Please review the following plan we discussed and let me know if I can assist you in the future.   Screening recommendations/referrals: Colonoscopy: no longer required  Mammogram: Done 05/06/21 repeat every year  Bone Density: Done 08/13/19 repeat every 5 years  Recommended yearly ophthalmology/optometry visit for glaucoma screening and checkup Recommended yearly dental visit for hygiene and checkup  Vaccinations: Influenza vaccine: Done 08/08/21 repeat every year  Pneumococcal vaccine: Up to date Tdap vaccine: Done 02/21/14 repeat every 10 years  Shingles vaccine: Shingrix discussed. Please contact your pharmacy for coverage information.    Covid-19:Completed 2/5, 3/2, 09/23/20 & 09/25/21  Advanced directives: Copies in chart   Conditions/risks identified: Lose weight  Next appointment: Follow up in one year for your annual wellness visit    Preventive Care 65 Years and Older, Female Preventive care refers to lifestyle choices and visits with your health care provider that can promote health and wellness. What does preventive care include? A yearly physical exam. This is also called an annual well check. Dental exams once or twice a year. Routine eye exams. Ask your health care provider how often you should have your eyes checked. Personal lifestyle choices, including: Daily care of your teeth and gums. Regular physical activity. Eating a healthy diet. Avoiding tobacco and drug use. Limiting alcohol use. Practicing safe sex. Taking low-dose aspirin every day. Taking vitamin and mineral supplements as recommended by your health care provider. What happens during an annual well check? The services and screenings done by your health care provider during your annual well check will depend on your age, overall health, lifestyle risk factors, and  family history of disease. Counseling  Your health care provider may ask you questions about your: Alcohol use. Tobacco use. Drug use. Emotional well-being. Home and relationship well-being. Sexual activity. Eating habits. History of falls. Memory and ability to understand (cognition). Work and work Statistician. Reproductive health. Screening  You may have the following tests or measurements: Height, weight, and BMI. Blood pressure. Lipid and cholesterol levels. These may be checked every 5 years, or more frequently if you are over 28 years old. Skin check. Lung cancer screening. You may have this screening every year starting at age 81 if you have a 30-pack-year history of smoking and currently smoke or have quit within the past 15 years. Fecal occult blood test (FOBT) of the stool. You may have this test every year starting at age 101. Flexible sigmoidoscopy or colonoscopy. You may have a sigmoidoscopy every 5 years or a colonoscopy every 10 years starting at age 19. Hepatitis C blood test. Hepatitis B blood test. Sexually transmitted disease (STD) testing. Diabetes screening. This is done by checking your blood sugar (glucose) after you have not eaten for a while (fasting). You may have this done every 1-3 years. Bone density scan. This is done to screen for osteoporosis. You may have this done starting at age 27. Mammogram. This may be done every 1-2 years. Talk to your health care provider about how often you should have regular mammograms. Talk with your health care provider about your test results, treatment options, and if necessary, the need for more tests. Vaccines  Your health care provider may recommend certain vaccines, such as: Influenza vaccine. This is recommended every year. Tetanus, diphtheria, and acellular pertussis (Tdap, Td) vaccine. You may need a Td booster every 10  years. Zoster vaccine. You may need this after age 87. Pneumococcal 13-valent conjugate  (PCV13) vaccine. One dose is recommended after age 58. Pneumococcal polysaccharide (PPSV23) vaccine. One dose is recommended after age 69. Talk to your health care provider about which screenings and vaccines you need and how often you need them. This information is not intended to replace advice given to you by your health care provider. Make sure you discuss any questions you have with your health care provider. Document Released: 11/26/2015 Document Revised: 07/19/2016 Document Reviewed: 08/31/2015 Elsevier Interactive Patient Education  2017 [employer] Prevention in the Home Falls can cause injuries. They can happen to people of all ages. There are many things you can do to make your home safe and to help prevent falls. What can I do on the outside of my home? Regularly fix the edges of walkways and driveways and fix any cracks. Remove anything that might make you trip as you walk through a door, such as a raised step or threshold. Trim any bushes or trees on the path to your home. Use bright outdoor lighting. Clear any walking paths of anything that might make someone trip, such as rocks or tools. Regularly check to see if handrails are loose or broken. Make sure that both sides of any steps have handrails. Any raised decks and porches should have guardrails on the edges. Have any leaves, snow, or ice cleared regularly. Use sand or salt on walking paths during winter. Clean up any spills in your garage right away. This includes oil or grease spills. What can I do in the bathroom? Use night lights. Install grab bars by the toilet and in the tub and shower. Do not use towel bars as grab bars. Use non-skid mats or decals in the tub or shower. If you need to sit down in the shower, use a plastic, non-slip stool. Keep the floor dry. Clean up any water that spills on the floor as soon as it happens. Remove soap buildup in the tub or shower regularly. Attach bath mats securely with  double-sided non-slip rug tape. Do not have throw rugs and other things on the floor that can make you trip. What can I do in the bedroom? Use night lights. Make sure that you have a light by your bed that is easy to reach. Do not use any sheets or blankets that are too big for your bed. They should not hang down onto the floor. Have a firm chair that has side arms. You can use this for support while you get dressed. Do not have throw rugs and other things on the floor that can make you trip. What can I do in the kitchen? Clean up any spills right away. Avoid walking on wet floors. Keep items that you use a lot in easy-to-reach places. If you need to reach something above you, use a strong step stool that has a grab bar. Keep electrical cords out of the way. Do not use floor polish or wax that makes floors slippery. If you must use wax, use non-skid floor wax. Do not have throw rugs and other things on the floor that can make you trip. What can I do with my stairs? Do not leave any items on the stairs. Make sure that there are handrails on both sides of the stairs and use them. Fix handrails that are broken or loose. Make sure that handrails are as long as the stairways. Check any carpeting to make sure  that it is firmly attached to the stairs. Fix any carpet that is loose or worn. Avoid having throw rugs at the top or bottom of the stairs. If you do have throw rugs, attach them to the floor with carpet tape. Make sure that you have a light switch at the top of the stairs and the bottom of the stairs. If you do not have them, ask someone to add them for you. What else can I do to help prevent falls? Wear shoes that: Do not have high heels. Have rubber bottoms. Are comfortable and fit you well. Are closed at the toe. Do not wear sandals. If you use a stepladder: Make sure that it is fully opened. Do not climb a closed stepladder. Make sure that both sides of the stepladder are locked  into place. Ask someone to hold it for you, if possible. Clearly mark and make sure that you can see: Any grab bars or handrails. First and last steps. Where the edge of each step is. Use tools that help you move around (mobility aids) if they are needed. These include: Canes. Walkers. Scooters. Crutches. Turn on the lights when you go into a dark area. Replace any light bulbs as soon as they burn out. Set up your furniture so you have a clear path. Avoid moving your furniture around. If any of your floors are uneven, fix them. If there are any pets around you, be aware of where they are. Review your medicines with your doctor. Some medicines can make you feel dizzy. This can increase your chance of falling. Ask your doctor what other things that you can do to help prevent falls. This information is not intended to replace advice given to you by your health care provider. Make sure you discuss any questions you have with your health care provider. Document Released: 08/26/2009 Document Revised: 04/06/2016 Document Reviewed: 12/04/2014 Elsevier Interactive Patient Education  2017 [employer].

## 2021-12-12 NOTE — Progress Notes (Signed)
Virtual Visit via Telephone Note  I connected with  Eliezer Lofts on 12/12/21 at 11:45 AM EST by telephone and verified that I am speaking with the correct person using two identifiers.  Medicare Annual Wellness visit completed telephonically due to Covid-19 pandemic.   Persons participating in this call: This Health Coach and this patient.   Location: Patient: Home Provider: Office   I discussed the limitations, risks, security and privacy concerns of performing an evaluation and management service by telephone and the availability of in person appointments. The patient expressed understanding and agreed to proceed.  Unable to perform video visit due to video visit attempted and failed and/or patient does not have video capability.   Some vital signs may be absent or patient reported.   Willette Brace, LPN   Subjective:   WAYLYNN BENEFIEL is a 76 y.o. female who presents for Medicare Annual (Subsequent) preventive examination.  Review of Systems     Cardiac Risk Factors include: hypertension;dyslipidemia;obesity (BMI >30kg/m2)     Objective:    There were no vitals filed for this visit. There is no height or weight on file to calculate BMI.  Advanced Directives 12/12/2021 10/31/2021 08/04/2021 07/28/2021 07/08/2020 07/06/2020 06/25/2020  Does Patient Have a Medical Advance Directive? Yes Yes Yes No Yes - Yes  Type of [employer] [place];Living will - Living will Living will [place]  Does patient want to make changes to medical advance directive? - - No - Patient declined - No - Patient declined No - Patient declined -  Copy of [place] in Chart? Yes - validated most recent copy scanned in chart (See row information) - No - copy requested - No - copy requested - No - copy requested  Would patient like information on creating a medical advance directive? - - No - Patient declined  No - Patient declined - - -    Current Medications (verified) Outpatient Encounter Medications as of 12/12/2021  Medication Sig   atorvastatin (LIPITOR) 10 MG tablet TAKE ONE TABLET BY MOUTH DAILY   clopidogrel (PLAVIX) 75 MG tablet TAKE ONE TABLET BY MOUTH DAILY   divalproex (DEPAKOTE ER) 250 MG 24 hr tablet Take 1-3 tablets (250-750 mg total) by mouth See admin instructions. Take 250 mg by mouth in the morning and 750 mg at bedtime   losartan (COZAAR) 25 MG tablet Take 1 tablet (25 mg total) by mouth daily.   topiramate (TOPAMAX) 50 MG tablet Take 1 tablet (50 mg total) by mouth at bedtime.   Vitamin D, Ergocalciferol, (DRISDOL) 1.25 MG (50000 UNIT) CAPS capsule TAKE 1 CAPSULE BY MOUTH ONCE EVERY WEDNESDAY   zolpidem (AMBIEN) 10 MG tablet Take 10 mg by mouth at bedtime as needed.   [DISCONTINUED] clopidogrel (PLAVIX) 75 MG tablet Take 1 tablet (75 mg total) by mouth daily.   [DISCONTINUED] Vitamin D, Ergocalciferol, (DRISDOL) 1.25 MG (50000 UNIT) CAPS capsule Take 1 capsule (50,000 Units total) by mouth every Wednesday.   No facility-administered encounter medications on file as of 12/12/2021.    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 Nl EF.    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 unknown if correct/patient adopted   Social History   Socioeconomic History   Marital status: Married    Spouse name: Vana Arif   Number of children: 2   Years of education: Teacher, English as a foreign language, almost masters   Highest education level: Not on file  Occupational History   Not on file  Tobacco Use   Smoking status: Never   Smokeless tobacco: Never  Vaping Use   Vaping Use: Never used  Substance and Sexual Activity   Alcohol use: Yes    Alcohol/week: 7.0 standard drinks    Types: 7 Standard drinks or equivalent per week   Drug use: No   Sexual activity: Yes    Partners: Male    Birth control/protection: Post-menopausal    Comment: vasectomy  Other Topics Concern   Not on file  Social History Narrative   Not on file   Social Determinants of Health   Financial Resource Strain: Low Risk    Difficulty of Paying Living Expenses: Not hard at all  Food Insecurity: No Food Insecurity   Worried About Charity fundraiser in the Last Year: Never true   [place] in the Last Year: Never true  Transportation Needs: No Transportation Needs   Lack of Transportation (Medical): No   Lack of Transportation (Non-Medical): No  Physical Activity: Inactive   Days of Exercise per Week: 0 days   Minutes of Exercise per Session: 0 min  Stress: No Stress Concern Present   Feeling of Stress : Not at all  Social Connections: Moderately Integrated   Frequency of Communication with Friends and Family: More than three times a week   Frequency of Social Gatherings with Friends and Family: More than three times a week   Attends Religious Services: More than 4 times per year   Active Member of [employer] or Organizations: No   Attends Music therapist: Never   Marital  Status: Married    Tobacco Counseling Counseling given: Not Answered   Clinical Intake:  Pre-visit preparation completed: Yes  Pain : No/denies pain     BMI - recorded: 31.74 Nutritional Status: BMI > 30  Obese Nutritional Risks: None Diabetes: No  How often do you need to have someone help you when you read instructions, pamphlets, or other written materials from your doctor or pharmacy?: 1 - Never  Diabetic?No  Interpreter Needed?: No  Information entered by :: Charlott Rakes, LPN   Activities of Daily Living In your present state of health, do you have any difficulty performing the following activities: 12/12/2021 07/29/2021  Hearing? N N  Vision? N N  Difficulty concentrating or making decisions? N N  Walking or climbing stairs? Y N  Dressing or bathing? N N  Doing errands, shopping? N N  Preparing Food and eating ? N -  Using the Toilet? N -  In the past six months, have you accidently leaked urine? Y -  Comment wears a panty liner -  Do you have problems with loss of bowel control? N -  Managing your Medications? N -  Managing your Finances? N -  Housekeeping or managing your Housekeeping? N -  Some recent data might be hidden    Patient Care Team: Leamon Arnt, MD as PCP - General (Family Medicine) Chucky May, MD as Consulting Physician (Psychiatry) Ashok Pall, MD as Consulting Physician (Neurosurgery) Marcial Pacas, MD as Consulting Physician (Neurology) Meredith Staggers, MD as Consulting Physician (Physical Medicine and Rehabilitation)  Indicate any recent Medical Services you may have received from other than [facility] providers in the past year (date may be approximate).     Assessment:   This is a routine wellness examination for [place].  Hearing/Vision screen Hearing Screening - Comments:: Pt denies any hearing issues  Vision Screening - Comments:: Pt follows up [facility]   Dietary issues and exercise activities discussed: Current  Exercise Habits: The patient does not participate in regular exercise at present   Goals Addressed             This Visit's Progress    Patient Stated       Lose weight        Depression Screen PHQ 2/9 Scores 12/12/2021 02/14/2021 09/29/2020  PHQ - 2 Score 0 1 0    Fall Risk Fall Risk  12/12/2021 08/22/2021 03/09/2021 12/24/2020 09/29/2020  Falls in the past year? 0 0 0 1 0  Comment - - - - -  Number falls in past yr: 0 - - 1 -  Injury with Fall? 0 - - 1 -  Risk for fall due to : Impaired vision;Impaired balance/gait - - - -  Follow up Falls prevention discussed - - - -    FALL RISK PREVENTION PERTAINING TO THE HOME:  Any stairs in or around the home? Yes  If so, are there any without handrails? No  Home free of loose throw rugs in walkways, pet beds, electrical cords, etc? Yes  Adequate lighting in your home to reduce risk of falls? Yes   ASSISTIVE DEVICES UTILIZED TO PREVENT FALLS:  Life alert? No  Use of a cane, walker or w/c? Yes  Grab bars in the bathroom? Yes  Shower chair or bench in shower? No  Elevated toilet seat or a handicapped toilet? No   TIMED UP AND GO:  Was the test performed? No .  Cognitive Function:     6CIT Screen 12/12/2021  What Year? 0 points  What month? 0 points  What time? 0 points  Count back from 20 2 points  Months in reverse 0 points  Repeat phrase 0 points  Total Score 2    Immunizations Immunization History  Administered Date(s) Administered   Fluad Quad(high Dose 65+) 08/08/2021   Influenza Split 09/05/2006, 09/03/2008, 08/24/2009, 08/08/2010, 08/30/2012, 09/17/2013, 09/06/2017, 09/16/2018   Influenza, High Dose Seasonal PF 08/30/2012   Influenza,inj,Quad PF,6+ Mos 07/30/2019   Influenza,inj,quad, With Preservative 08/18/2014, 09/24/2015   PFIZER(Purple Top)SARS-COV-2 Vaccination 12/19/2019, 01/13/2020, 09/23/2020   Pfizer Covid-19 Vaccine Bivalent Booster 20yrs & up 09/25/2021   Pneumococcal Conjugate-13 04/13/2014    Pneumococcal Polysaccharide-23 10/18/2011   Tdap 01/30/2007, 02/21/2017   Zoster, Live 09/28/2008, 03/25/2020, 11/08/2020    TDAP status: Up to date  Flu Vaccine status: Up to date  Pneumococcal vaccine status: Up to date  Covid-19 vaccine status: Completed vaccines  Qualifies  for Shingles Vaccine? Yes   Zostavax completed No   Shingrix Completed?: No.    Education has been provided regarding the importance of this vaccine. Patient has been advised to call insurance company to determine out of pocket expense if they have not yet received this vaccine. Advised may also receive vaccine at local pharmacy or Health Dept. Verbalized acceptance and understanding.  Screening Tests Health Maintenance  Topic Date Due   Zoster Vaccines- Shingrix (1 of 2) Never done   MAMMOGRAM  04/14/2022   DEXA SCAN  08/12/2024   TETANUS/TDAP  02/22/2027   Pneumonia Vaccine 76+ Years old  Completed   INFLUENZA VACCINE  Completed   COVID-19 Vaccine  Completed   Hepatitis C Screening  Completed   HPV VACCINES  Aged Out   COLONOSCOPY (Pts 45-51yrs Insurance coverage will need to be confirmed)  Discontinued    Health Maintenance  Health Maintenance Due  Topic Date Due   Zoster Vaccines- Shingrix (1 of 2) Never done    Colorectal cancer screening: No longer required.   Mammogram status: Completed 04/14/21. Repeat every year  Bone Density status: Completed 08/13/19. Results reflect: Bone density results: NORMAL. Repeat every 5 years.  Additional Screening:  Hepatitis C Screening:  Completed 03/21/21  Vision Screening: Recommended annual ophthalmology exams for early detection of glaucoma and other disorders of the eye. Is the patient up to date with their annual eye exam?  Yes  Who is the provider or what is the name of the office in which the patient attends annual eye exams? [facility]  If pt is not established with a provider, would they like to be referred to a provider to establish care? No .    Dental Screening: Recommended annual dental exams for proper oral hygiene  Community Resource Referral / Chronic Care Management: CRR required this visit?  No   CCM required this visit?  No      Plan:     I have personally reviewed and noted the following in the patients chart:   Medical and social history Use of alcohol, tobacco or illicit drugs  Current medications and supplements including opioid prescriptions.  Functional ability and status Nutritional status Physical activity Advanced directives List of other physicians Hospitalizations, surgeries, and ER visits in previous 12 months Vitals Screenings to include cognitive, depression, and falls Referrals and appointments  In addition, I have reviewed and discussed with patient certain preventive protocols, quality metrics, and best practice recommendations. A written personalized care plan for preventive services as well as general preventive health recommendations were provided to patient.     Willette Brace, LPN   06/13/6552   Nurse Notes: None

## 2021-12-12 NOTE — Telephone Encounter (Signed)
Patient was wanting to know if she should be fasting for her appointment for 12/13/21.

## 2021-12-13 ENCOUNTER — Encounter: Payer: Self-pay | Admitting: Family Medicine

## 2021-12-13 ENCOUNTER — Ambulatory Visit (INDEPENDENT_AMBULATORY_CARE_PROVIDER_SITE_OTHER): Payer: Medicare Other | Admitting: Family Medicine

## 2021-12-13 VITALS — BP 127/79 | HR 68 | Temp 97.7°F | Ht 63.0 in | Wt 175.4 lb

## 2021-12-13 DIAGNOSIS — I10 Essential (primary) hypertension: Secondary | ICD-10-CM | POA: Diagnosis not present

## 2021-12-13 DIAGNOSIS — E119 Type 2 diabetes mellitus without complications: Secondary | ICD-10-CM | POA: Insufficient documentation

## 2021-12-13 DIAGNOSIS — Z8679 Personal history of other diseases of the circulatory system: Secondary | ICD-10-CM | POA: Diagnosis not present

## 2021-12-13 DIAGNOSIS — E782 Mixed hyperlipidemia: Secondary | ICD-10-CM

## 2021-12-13 DIAGNOSIS — F3110 Bipolar disorder, current episode manic without psychotic features, unspecified: Secondary | ICD-10-CM

## 2021-12-13 DIAGNOSIS — I82542 Chronic embolism and thrombosis of left tibial vein: Secondary | ICD-10-CM

## 2021-12-13 DIAGNOSIS — E1169 Type 2 diabetes mellitus with other specified complication: Secondary | ICD-10-CM | POA: Diagnosis not present

## 2021-12-13 DIAGNOSIS — E118 Type 2 diabetes mellitus with unspecified complications: Secondary | ICD-10-CM | POA: Diagnosis not present

## 2021-12-13 LAB — POCT GLYCOSYLATED HEMOGLOBIN (HGB A1C): Hemoglobin A1C: 5.9 % — AB (ref 4.0–5.6)

## 2021-12-13 MED ORDER — SHINGRIX 50 MCG/0.5ML IM SUSR: 0.5000 mL | Freq: Once | INTRAMUSCULAR | 0 refills | Status: AC

## 2021-12-13 MED ORDER — ATORVASTATIN CALCIUM 10 MG PO TABS: 10.0000 mg | ORAL_TABLET | Freq: Every evening | ORAL | 3 refills | Status: DC

## 2021-12-13 MED ORDER — LOSARTAN POTASSIUM 25 MG PO TABS: 25.0000 mg | ORAL_TABLET | Freq: Every day | ORAL | 3 refills | Status: DC

## 2021-12-13 MED ORDER — CLOPIDOGREL BISULFATE 75 MG PO TABS: 75.0000 mg | ORAL_TABLET | Freq: Every day | ORAL | 3 refills | Status: DC

## 2021-12-13 NOTE — Progress Notes (Signed)
Subjective  CC:  Chief Complaint  Patient presents with   Diabetes   Hypertension   Hyperlipidemia   Medication Refill    Losartan     HPI: Amanda Collier is a 76 y.o. female who presents to the office today for follow up of diabetes and problems listed above in the chief complaint.  Diabetes follow up: mild diabetes: has been diet controlled. No sxs of hyperglycemia. She denies exertional CP or SOB or symptomatic hypoglycemia. She denies foot sores or paresthesias.  HTN: Feeling well. Taking medications w/o adverse effects. No symptoms of CHF, angina; no palpitations, sob, cp or lower extremity edema. Compliant with meds.  HLD is at goal with statin; labs 07/2021 reviewed.  Mood: reports controlled.  H/o TIA: on plavix w/o new neuro sxs. Reviewed neuro notes. Annual f/u recommended. 2ndary prevention: bp and lipid control H/o DVT w/o swelling  Wt Readings from Last 3 Encounters:  12/13/21 175 lb 6.4 oz (79.6 kg)  08/22/21 179 lb 3.2 oz (81.3 kg)  08/08/21 177 lb (80.3 kg)    BP Readings from Last 3 Encounters:  12/13/21 127/79  08/22/21 130/78  08/08/21 122/68    Assessment  1. Controlled type 2 diabetes mellitus with complication, without long-term current use of insulin ([place])   2. Essential hypertension   3. Combined hyperlipidemia associated with type 2 diabetes mellitus ([place])   4. 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HCC) Chronic  5. 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 History of subdural hematoma      Plan  Diabetes is currently very well controlled. Diet controlled.  HTN continue losartan 25. Controlled.  HLD at goal on statin.  H/o TIA: continue plavix. Refilled Mood: stable.  Monitor for edema or neuro changes.  Shinrix education given and recommended. RX given.   Follow up: 6 mo for cpe. Orders Placed This Encounter  Procedures   POCT glycosylated hemoglobin (Hb A1C)   Meds ordered this encounter   Medications   atorvastatin (LIPITOR) 10 MG tablet    Sig: Take 1 tablet (10 mg total) by mouth at bedtime.    Dispense:  90 tablet    Refill:  3   clopidogrel (PLAVIX) 75 MG tablet    Sig: Take 1 tablet (75 mg total) by mouth daily.    Dispense:  90 tablet    Refill:  3   losartan (COZAAR) 25 MG tablet    Sig: Take 1 tablet (25 mg total) by mouth daily.    Dispense:  90 tablet    Refill:  3   Zoster Vaccine Adjuvanted [facility]) injection    Sig: Inject 0.5 mLs into the muscle once for 1 dose. Please give 2nd dose 2-6 months after first dose    Dispense:  2 each    Refill:  0      Immunization History  Administered Date(s) Administered   Fluad Quad(high Dose 65+) 08/08/2021   Influenza Split 09/05/2006, 09/03/2008, 08/24/2009, 08/08/2010, 08/30/2012, 09/17/2013, 09/06/2017, 09/16/2018   Influenza, High Dose Seasonal PF 08/30/2012   Influenza,inj,Quad PF,6+ Mos 07/30/2019   Influenza,inj,quad, With Preservative 08/18/2014, 09/24/2015   PFIZER(Purple Top)SARS-COV-2 Vaccination 12/19/2019, 01/13/2020, 09/23/2020   Pfizer Covid-19 Vaccine Bivalent Booster 29yrs & up 09/25/2021   Pneumococcal Conjugate-13 04/13/2014   Pneumococcal Polysaccharide-23 10/18/2011   Tdap 01/30/2007, 02/21/2017   Zoster, Live 09/28/2008, 03/25/2020, 11/08/2020    Diabetes Related Lab Review: Lab Results  Component Value Date   HGBA1C 5.9 (  A) 12/13/2021   HGBA1C 6.6 (H) 07/29/2021   HGBA1C 5.6 03/21/2021    Lab Results  Component Value Date   MICROALBUR <0.7 03/21/2021   Lab Results  Component Value Date   CREATININE 0.89 07/28/2021   BUN 25 (H) 07/28/2021   NA 139 07/28/2021   K 3.5 07/28/2021   CL 103 07/28/2021   CO2 25 07/28/2021   Lab Results  Component Value Date   CHOL 141 07/29/2021   CHOL 156 03/21/2021   CHOL 164 11/10/2020   Lab Results  Component Value Date   HDL 57 07/29/2021   HDL 66.50 03/21/2021   Lab Results  Component Value Date   LDLCALC 62 07/29/2021    LDLCALC 64 03/21/2021   LDLCALC 82 11/10/2020   Lab Results  Component Value Date   TRIG 108 07/29/2021   TRIG 127.0 03/21/2021   TRIG 75 11/10/2020   Lab Results  Component Value Date   CHOLHDL 2.5 07/29/2021   CHOLHDL 2 03/21/2021   No results found for: LDLDIRECT The 10-year ASCVD risk score (Arnett DK, et al., 2019) is: 39.1%   Values used to calculate the score:     Age: 61 years     Sex: Female     Is Non-Hispanic African American: No     Diabetic: Yes     Tobacco smoker: No     Systolic Blood Pressure: 465 mmHg     Is BP treated: Yes     HDL Cholesterol: 57 mg/dL     Total Cholesterol: 141 mg/dL I have reviewed the PMH, Fam and Soc history. Patient Active Problem List   Diagnosis Date Noted   Controlled type 2 diabetes mellitus without complication, without long-term current use of insulin ([place]) 12/13/2021    Priority: High   History of subdural hematoma 12/13/2021    Priority: High   Hx of traumatic brain injury 03/09/2021    Priority: High   Combined hyperlipidemia associated with type 2 diabetes mellitus ([place]) 12/26/2020    Priority: High   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[place]) 12/24/2020    Priority: High   Essential hypertension 12/17/2015    Priority: High   Personal history of colonic polyps 12/24/2020    Priority: Medium    Neurogenic bladder     Priority: Medium    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 Nl EF.     Right-sided sensorineural hearing loss 09/29/2020    Priority: Low   Status post craniotomy 06/24/2020    Priority: Low    Social History: Patient  reports that she has never smoked. She has never used  smokeless tobacco. She reports current alcohol use of about 7.0 standard drinks per week. She reports that she does not use drugs.  Review of Systems: Ophthalmic: negative for eye pain, loss of vision or double vision Cardiovascular: negative for chest pain Respiratory: negative for SOB or persistent cough Gastrointestinal: negative for abdominal pain Genitourinary: negative for dysuria or gross hematuria MSK: negative for foot lesions Neurologic: negative for weakness or gait disturbance  Objective  Vitals:  BP 127/79 (BP Location: Left Arm, Patient Position: Sitting, Cuff Size: Normal)    Pulse 68    Temp 97.7 F (36.5 C) (Temporal)    Ht 5\' 3"  (1.6 m)    Wt 175 lb 6.4 oz (79.6 kg)    LMP 11/14/2003    SpO2 97%    BMI 31.07 kg/m  General: well appearing, no acute distress  Psych:  Alert and oriented, normal mood and affect HEENT:  Normocephalic, atraumatic, moist mucous membranes, supple neck  Cardiovascular:  Nl S1 and S2, RRR without murmur, gallop or rub. no edema Respiratory:  Good breath sounds bilaterally, CTAB with normal effort, no rales Gastrointestinal: normal BS, soft, nontender Skin:  Warm, no rashes Neurologic:   Mental status is normal. normal gait Foot exam: no erythema, pallor, or cyanosis visible nl proprioception and sensation to monofilament testing bilaterally, +2 distal pulses bilaterally    Diabetic education: ongoing education regarding chronic disease management for diabetes was given today. We continue to reinforce the ABC's of diabetic management: A1c (<7 or 8 dependent upon patient), tight blood pressure control, and cholesterol management with goal LDL < 100 minimally. We discuss diet strategies, exercise recommendations, medication options and possible side effects. At each visit, we review recommended immunizations and preventive care recommendations for diabetics and stress that good diabetic control can prevent other problems. See below for this patient's  data.   Commons side effects, risks, benefits, and alternatives for medications and treatment plan prescribed today were discussed, and the patient expressed understanding of the given instructions. Patient is instructed to call or message via MyChart if he/she has any questions or concerns regarding our treatment plan. No barriers to understanding were identified. We discussed Red Flag symptoms and signs in detail. Patient expressed understanding regarding what to do in case of urgent or emergency type symptoms.  Medication list was reconciled, printed and provided to the patient in AVS. Patient instructions and summary information was reviewed with the patient as documented in the AVS. This note was prepared with assistance of Dragon voice recognition software. Occasional wrong-word or sound-a-like substitutions may have occurred due to the inherent limitations of voice recognition software  This visit occurred during the SARS-CoV-2 public health emergency.  Safety protocols were in place, including screening questions prior to the visit, additional usage of staff PPE, and extensive cleaning of exam room while observing appropriate contact time as indicated for disinfecting solutions.

## 2021-12-13 NOTE — Patient Instructions (Signed)
Please return in 6 months for your annual complete physical; please come fasting.   If you have any questions or concerns, please don't hesitate to send me a message via MyChart or call the office at [phone]. Thank you for visiting with us today! It's our pleasure caring for you.  

## 2021-12-15 ENCOUNTER — Ambulatory Visit: Payer: Medicare Other | Attending: Obstetrics and Gynecology | Admitting: Physical Therapy

## 2021-12-15 ENCOUNTER — Other Ambulatory Visit: Payer: Self-pay

## 2021-12-15 DIAGNOSIS — R279 Unspecified lack of coordination: Secondary | ICD-10-CM | POA: Insufficient documentation

## 2021-12-15 DIAGNOSIS — M6281 Muscle weakness (generalized): Secondary | ICD-10-CM | POA: Insufficient documentation

## 2021-12-15 NOTE — Therapy (Signed)
[place] @ [facility] [place], [place], [address] Phone: [phone]   Fax:  [phone]  Physical Therapy Treatment  Patient Details  Name: Amanda Collier MRN: [id number] Date of Birth: 1946/02/24 Referring Provider (PT): Jaquita Folds, MD   Encounter Date: 12/15/2021   PT End of Session - 12/15/21 1151     Visit Number 2    Date for PT Re-Evaluation 01/29/22    Authorization Type Medicare/ [employer]    Progress Note Due on Visit 10    PT Start Time 1100    PT Stop Time 1145    PT Time Calculation (min) 45 min    Activity Tolerance Patient tolerated treatment well    Behavior During Therapy [facility] for tasks assessed/performed             Past Medical History:  Diagnosis Date   Anxiety    Diabetes mellitus without complication ([place])    Headache syndrome 12/18/2016   Heart murmur 03/21/2021   Last echo 2018; calcified aortic valve w/o regurg or stenosis. Nl EF.    High blood pressure    Hypertension    followed by Dr Gillian Shields medications   TIA (transient ischemic attack) 07/2021    Past Surgical History:  Procedure Laterality Date   CRANIOTOMY Bilateral 06/24/2020   Procedure: Bilateral craniotomy for evacuation of subdural hematoma;  Surgeon: Ashok Pall, MD;  Location: [facility];  Service: Neurosurgery;  Laterality: Bilateral;   CRANIOTOMY Right 06/26/2020   Procedure: CRANIOTOMY HEMATOMA EVACUATION SUBDURAL;  Surgeon: Ashok Pall, MD;  Location: [facility];  Service: Neurosurgery;  Laterality: Right;    There were no vitals filed for this visit.   Subjective Assessment - 12/15/21 1102     Subjective Pt reports she has been trying to attempt decreasing bladder irritants. Pt reports she has no longer been getting up during the night and has noticed a decrease in frequency. Still wears 2 panty liners but noticed she has leaks much less often.    Pertinent History sept 15 2021 TIA    How long can  you sit comfortably? no limits    How long can you stand comfortably? no limits    How long can you walk comfortably? no limits    Patient Stated Goals to have less leakage and urgency    Currently in Pain? No/denies                               [facility] Adult PT Treatment/Exercise - 12/15/21 0001       Self-Care   Self-Care Other Self-Care Comments    Other Self-Care Comments  pt educated on HEP given, and went over      Exercises   Exercises Lumbar;Knee/Hip      Lumbar Exercises: Seated   Sit to Stand 20 reps    Sit to Stand Limitations 5# with coordinated breathing and pelvic floor      Lumbar Exercises: Supine   Clam 20 reps;Limitations    Clam Limitations red loop 2x10    Bridge 20 reps    Other Supine Lumbar Exercises hip flexion red loop 2x10      Lumbar Exercises: Quadruped   [place] 20 reps                     PT Education - 12/15/21 1150     Education Details Pt educated  on HEP and continued urge drill at home, progression with less pad use as able    Person(s) Educated Patient    Methods Explanation;Demonstration;Tactile cues;Verbal cues;Handout    Comprehension Verbalized understanding;Returned demonstration              PT Short Term Goals - 10/31/21 1231       PT SHORT TERM GOAL #1   Title Pt to be I with HEP    Time 4    Status New    Target Date 11/28/21      PT SHORT TERM GOAL #2   Title Pt to demonstrate at least 4/5 pelvic floor strength and ability to hold for 5s for decreased urinary incontinence symptoms    Time 4    Period Weeks    Status New    Target Date 11/28/21               PT Long Term Goals - 10/31/21 1248       PT LONG TERM GOAL #1   Title pt to be Ind with advanced HEP    Time 3    Period Months    Status New    Target Date 01/29/22      PT LONG TERM GOAL #2   Title Pt to demonstrate at least 4/5 pelvic floor strength and ability to hold for 8s for decreased urinary  incontinence symptoms    Time 3    Period Months    Status New    Target Date 01/29/22      PT LONG TERM GOAL #3   Title Pt to demonstrate at least 4/5 bil hip strength for improved pelvic stability and decreased incontinence symptoms    Time 3    Period Months    Status New    Target Date 01/29/22      PT LONG TERM GOAL #4   Title pt to rpeort no more than 1 leak per week for improved QOL    Time 3    Period Months    Status New    Target Date 01/29/22                   Plan - 12/15/21 1151     Clinical Impression Statement Pt presents reporting she is very pleased with progress and would like one additional appointment at this time to see how shes doing then. Pt session focused on coordinating pelvic floor and breathing with strengthening exercises to decrease downward strain/pressure with mobility to decrease leakage with need of cues intermittently but demonstrated good tolerance and technique. Pt would benefit from additional PT to further address needs.    Personal Factors and Comorbidities Age;Time since onset of injury/illness/exacerbation;Comorbidity 1    Comorbidities recent TIA, x2 vaginal births    Examination-Activity Limitations Locomotion Level;Continence;Hygiene/Grooming;Stairs    Examination-Participation Restrictions Community Activity;Shop    Stability/Clinical Decision Making Stable/Uncomplicated    Rehab Potential Good    PT Frequency 1x / week    PT Duration Other (comment)   10 visits   PT Treatment/Interventions ADLs/Self Care Home Management;Functional mobility training;Therapeutic activities;Therapeutic exercise;Neuromuscular re-education;Manual techniques;Patient/family education;Passive range of motion;Energy conservation    PT Next Visit Plan reassess pelvic floor if needed vs review of urge drill and coordination    PT Home Exercise Plan [id number]    Consulted and Agree with Plan of Care Patient             Patient will benefit from  skilled therapeutic  intervention in order to improve the following deficits and impairments:  Decreased coordination, Decreased endurance, Difficulty walking, Impaired flexibility, Improper body mechanics, Postural dysfunction, Decreased strength, Decreased mobility, Decreased balance  Visit Diagnosis: Muscle weakness (generalized)  Lack of coordination     Problem List Patient Active Problem List   Diagnosis Date Noted   Controlled type 2 diabetes mellitus without complication, without long-term current use of insulin ([place]) 12/13/2021   History of subdural hematoma 12/13/2021   Heart murmur 03/21/2021   Hx of traumatic brain injury 03/09/2021   Combined hyperlipidemia associated with type 2 diabetes mellitus ([place]) 12/26/2020   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[place]) 12/24/2020   Personal history of colonic polyps 12/24/2020   Right-sided sensorineural hearing loss 09/29/2020   Neurogenic bladder    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

## 2021-12-19 NOTE — Telephone Encounter (Signed)
completed

## 2021-12-31 IMAGING — MR MR CERVICAL SPINE W/O CM
21 of 26 series · 28 of 48 positions shown · non-contrast
Comparison: None.

CLINICAL DATA: Several falls over the last 3 weeks. Ataxia.
Bilateral subdural hematoma.

EXAM:
MRI CERVICAL AND THORACIC SPINE WITHOUT CONTRAST
TECHNIQUE: Multiplanar and multiecho pulse sequences of the cervical spine, to
include the craniocervical junction and cervicothoracic junction,
and the thoracic spine, were obtained without intravenous contrast.

[Series 5: dwi_tracew · axial · 3.0mm · 1.08mm/px · z∈[-2,+138]mm · 3 of 102 slices shown]
[im 1/102]
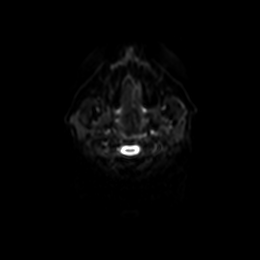
[im 51/102]
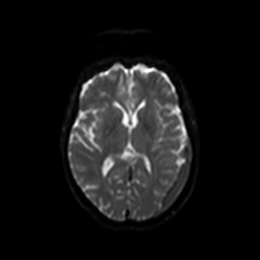
[im 102/102]
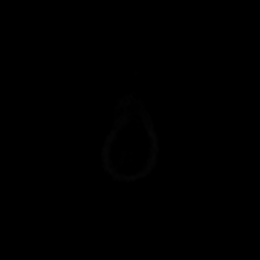

[Series 6: dwi_adc · axial · 3.0mm · 1.08mm/px · 1 of 51 slices shown]
[im 1/51]
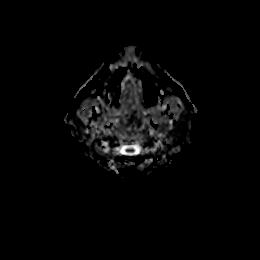

[Series 7: GRE · axial · 3.0mm · 0.45mm/px · 1 of 51 slices shown (1 of 2)]
[im 1/51]
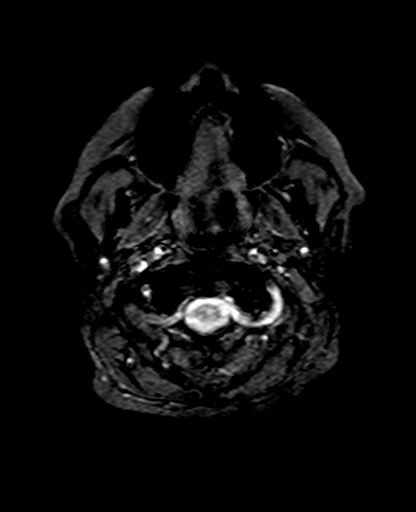

[Series 8: T1 · sagittal · 5.0mm · 0.75mm/px · 1 of 24 slices shown (1 of 5)]
[im 1/24]
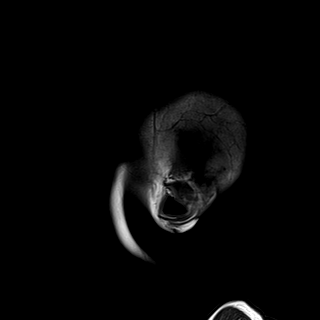

[Series 9: FLAIR · axial · 3.0mm · 0.86mm/px · 1 of 51 slices shown]
[im 1/51]
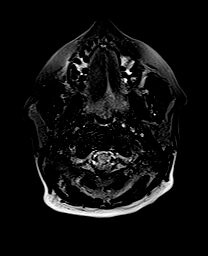

[Series 10: T1 · axial · 1.0mm · 0.94mm/px · z∈[-7,+127]mm · 5 of 144 slices shown (2 of 5)]
[im 1/144]
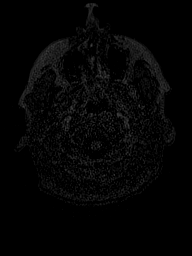
[im 36/144]
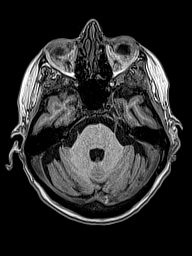
[im 72/144]
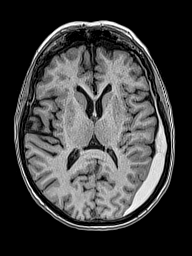
[im 108/144]
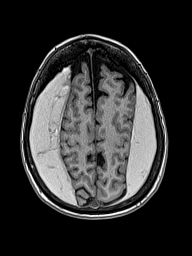
[im 144/144]
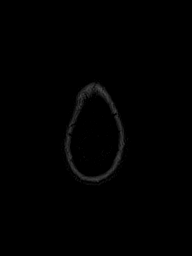

[Series 11: T2 · axial · 5.0mm · 0.45mm/px · 1 of 24 slices shown (1 of 6)]
[im 1/24]
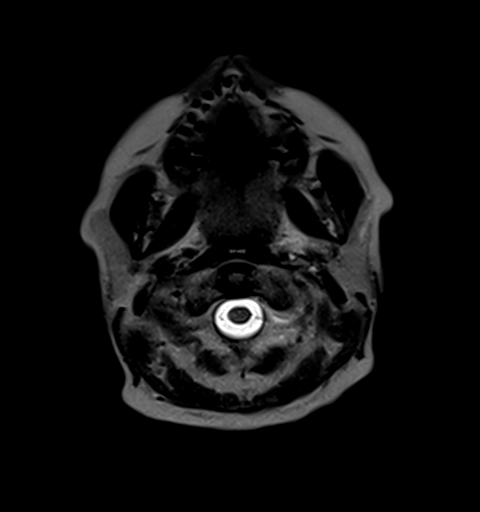

[Series 12: T2 · coronal · 5.0mm · 0.57mm/px · 1 of 28 slices shown (2 of 6)]
[im 1/28]
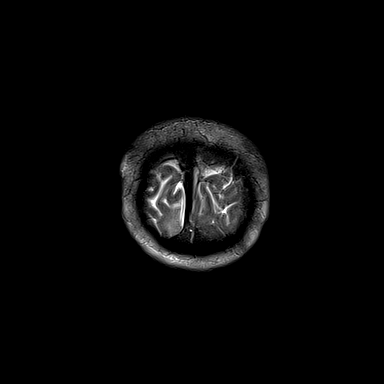

[Series 13: DWI · coronal · 5.0mm · 1.31mm/px · 2 of 48 slices shown (1 of 2)]
[im 1/48]
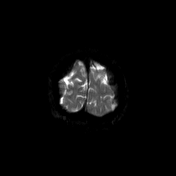
[im 48/48]
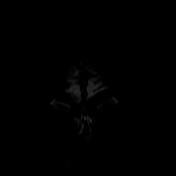

[Series 14: DWI · coronal · 5.0mm · 1.31mm/px · 1 of 24 slices shown (2 of 2)]
[im 1/24]
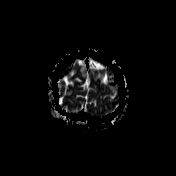

[Series 19: T1 · sagittal · 3.0mm · 0.69mm/px · 1 of 15 slices shown (3 of 5)]
[im 1/15]
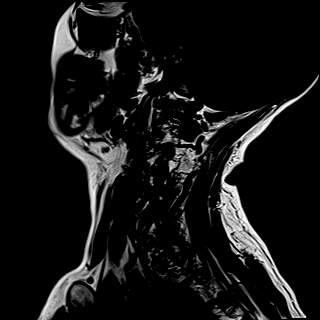

[Series 20: STIR · sagittal · 3.0mm · 0.86mm/px · 1 of 15 slices shown (1 of 2)]
[im 1/15]
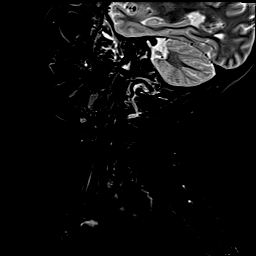

[Series 21: T2 · sagittal · 3.0mm · 0.69mm/px · 1 of 15 slices shown (3 of 6)]
[im 1/15]
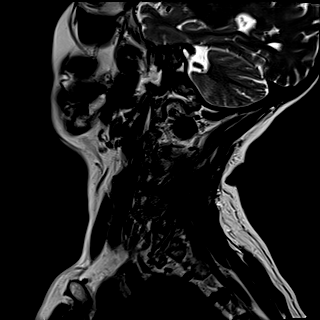

[Series 22: T2 · axial · 3.0mm · 0.70mm/px · 1 of 27 slices shown (4 of 6)]
[im 1/27]
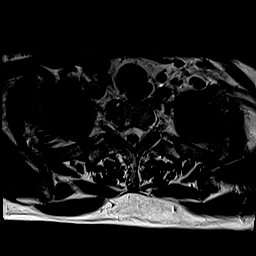

[Series 23: GRE · axial · 3.0mm · 0.35mm/px · 1 of 28 slices shown (2 of 2)]
[im 1/28]
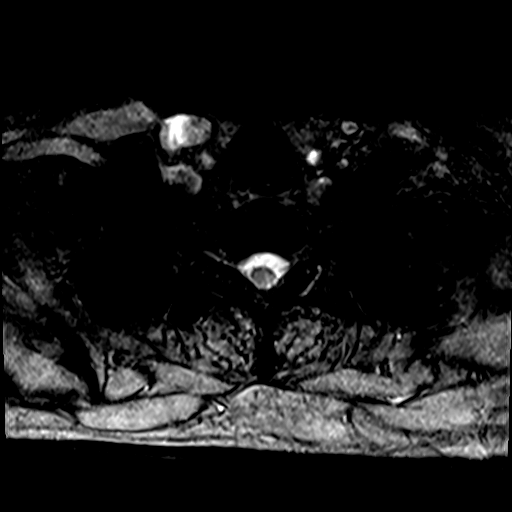

[Series 39: T1 · sagittal · 4.0mm · 1.72mm/px · 1 of 7 slices shown (4 of 5)]
[im 1/7]
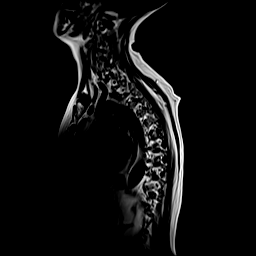

[Series 40: STIR · sagittal · 3.0mm · 1.00mm/px · 1 of 16 slices shown (2 of 2)]
[im 1/16]
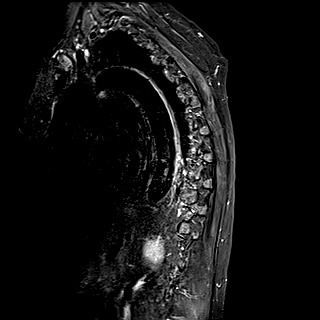

[Series 41: T1 · sagittal · 3.0mm · 1.00mm/px · 1 of 16 slices shown (5 of 5)]
[im 1/16]
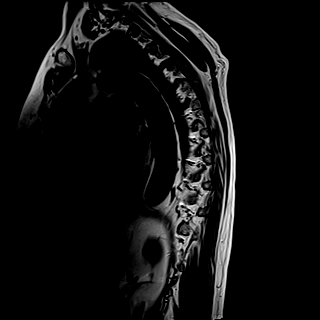

[Series 42: T2 · sagittal · 3.0mm · 0.83mm/px · 1 of 16 slices shown (5 of 6)]
[im 1/16]
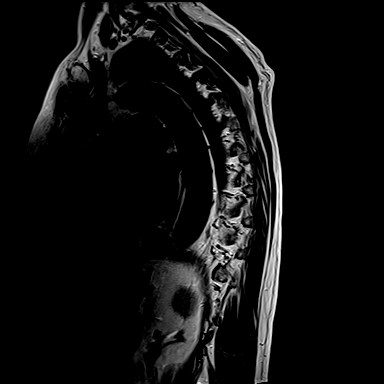

[Series 43: T2 · axial · 4.0mm · 0.78mm/px · 1 of 39 slices shown (6 of 6)]
[im 1/39]
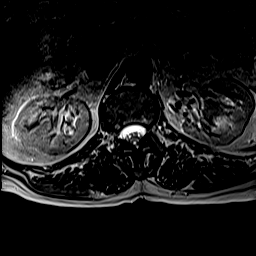

[Series 44: t2_me2d_tra · axial · 4.0mm · 0.39mm/px · 1 of 39 slices shown]
[im 1/39]
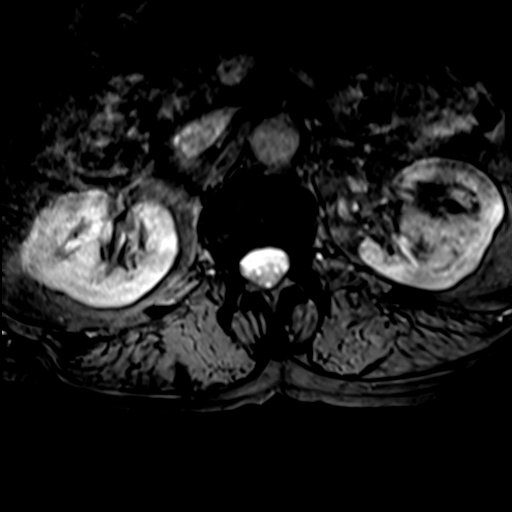

[28 of 48 positions shown; findings below may reference images not displayed]

FINDINGS: MRI CERVICAL SPINE FINDINGS

Alignment: Slight retrolisthesis C4-5 otherwise normal alignment.

Vertebrae: Negative for fracture or mass.

Cord: Normal signal and morphology

Posterior Fossa, vertebral arteries, paraspinal tissues: Negative
for mass or hematoma

Disc levels:

C2-3: Negative

C3-4: Tiny central disc protrusion. Negative for spinal or foraminal
stenosis

C4-5: Mild retrolisthesis. Bilateral uncinate spurring right greater
than left. Cord flattening and mild to moderate spinal stenosis
right greater than left. Moderate foraminal stenosis bilaterally due
to spurring. Mild facet degeneration bilaterally.

C5-6: Small central disc protrusion with mild spinal stenosis.
Neural foramina patent bilaterally

C6-7: Small central disc protrusion. Neural foramina patent
bilaterally

C7-T1: Negative

MRI THORACIC SPINE FINDINGS

Alignment:  Normal

Vertebrae: Negative for fracture or mass. Mild edema to the right of
T8-9 appears to be related to costovertebral degenerative change.

Cord:  Normal signal morphology.  No cord compression.

Paraspinal and other soft tissues: No paraspinous mass or hematoma.
No pleural effusion.

Disc levels:

Mild disc degeneration in the thoracic spine at multiple levels with
disc space narrowing and disc desiccation. No significant spinal
stenosis. Mild spurring on the right at T3-4. Mild spurring on the
right at T6-7. No focal disc protrusion.
IMPRESSION: 1. Negative for cervical spine fracture.
2. Mild to moderate degenerative change cervical spine. Spinal
stenosis right greater left at C4-5 due to spurring. Moderate
foraminal stenosis bilaterally. Mild spinal stenosis C5-6.
3. Negative for thoracic spine fracture. Mild degenerative change.
No cord compression.

## 2021-12-31 IMAGING — MR MR THORACIC SPINE W/O CM
23 of 28 series · 32 of 48 positions shown · non-contrast
Comparison: None.

CLINICAL DATA: Several falls over the last 3 weeks. Ataxia.
Bilateral subdural hematoma.

EXAM:
MRI CERVICAL AND THORACIC SPINE WITHOUT CONTRAST
TECHNIQUE: Multiplanar and multiecho pulse sequences of the cervical spine, to
include the craniocervical junction and cervicothoracic junction,
and the thoracic spine, were obtained without intravenous contrast.

[Series 5: dwi_tracew · axial · 3.0mm · 1.08mm/px · z∈[-2,+138]mm · 3 of 102 slices shown]
[im 1/102]
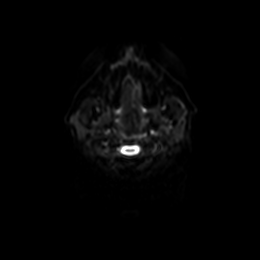
[im 51/102]
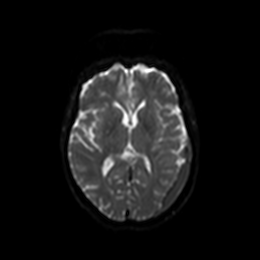
[im 102/102]
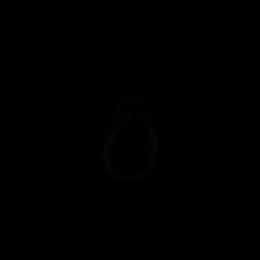

[Series 6: dwi_adc · axial · 3.0mm · 1.08mm/px · 1 of 51 slices shown]
[im 1/51]
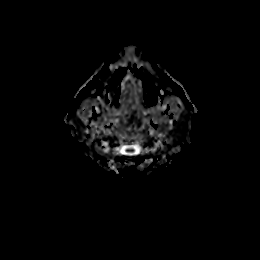

[Series 7: GRE · axial · 3.0mm · 0.45mm/px · z∈[-13,+127]mm · 2 of 51 slices shown (1 of 2)]
[im 1/51]
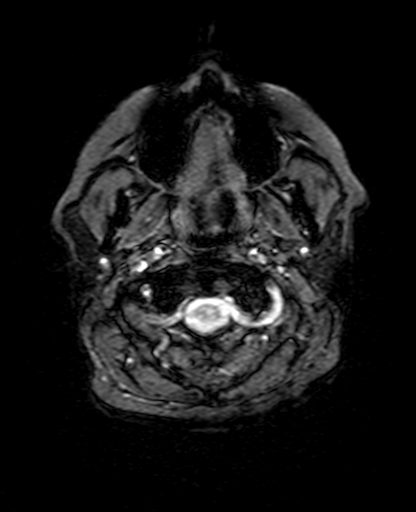
[im 51/51]
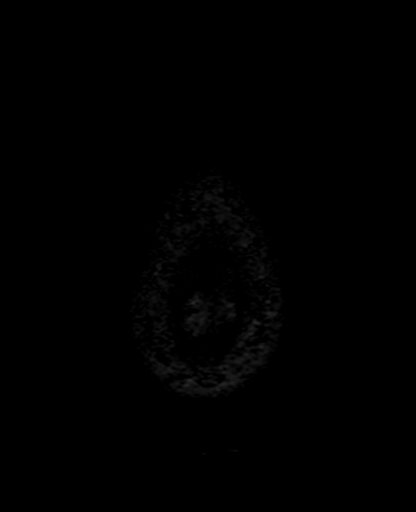

[Series 8: T1 · sagittal · 5.0mm · 0.75mm/px · 1 of 24 slices shown (1 of 5)]
[im 1/24]
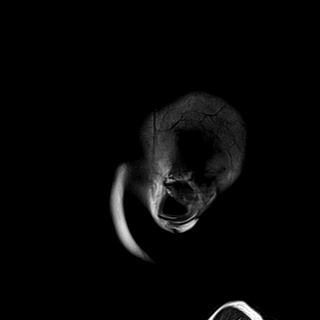

[Series 9: FLAIR · axial · 3.0mm · 0.86mm/px · z∈[-14,+126]mm · 2 of 51 slices shown]
[im 1/51]
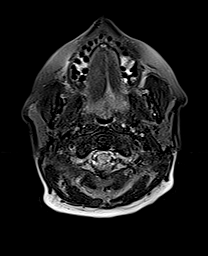
[im 51/51]
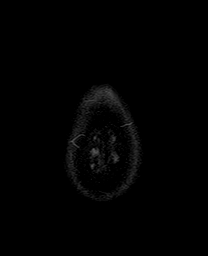

[Series 10: T1 · axial · 1.0mm · 0.94mm/px · z∈[-7,+127]mm · 5 of 144 slices shown (2 of 5)]
[im 1/144]
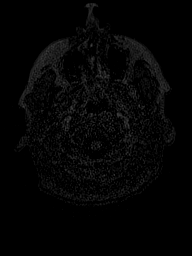
[im 36/144]
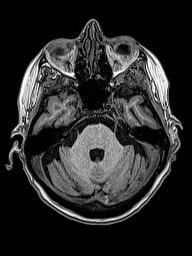
[im 72/144]
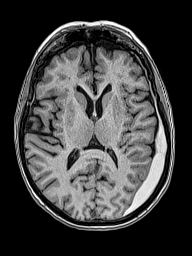
[im 108/144]
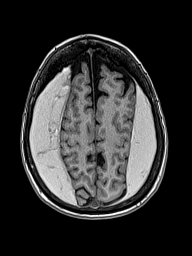
[im 144/144]
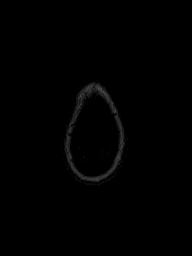

[Series 11: T2 · axial · 5.0mm · 0.45mm/px · 1 of 24 slices shown (1 of 7)]
[im 1/24]
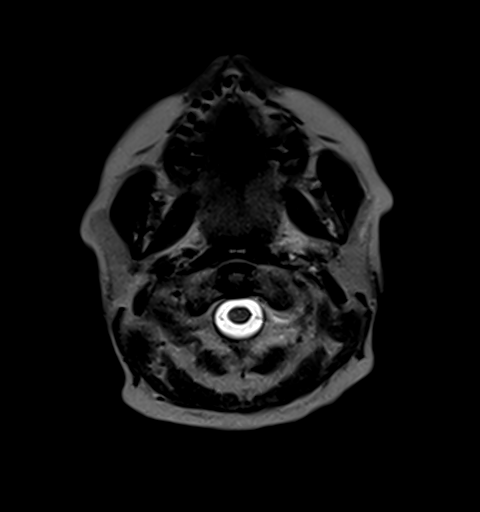

[Series 12: T2 · coronal · 5.0mm · 0.57mm/px · 1 of 28 slices shown (2 of 7)]
[im 1/28]
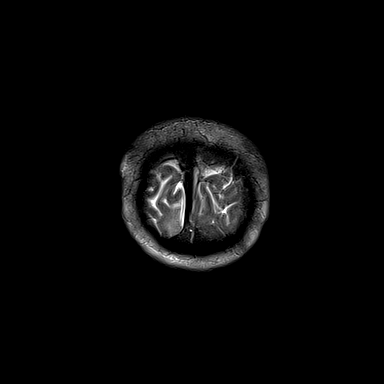

[Series 13: DWI · coronal · 5.0mm · 1.31mm/px · 2 of 48 slices shown (1 of 2)]
[im 1/48]
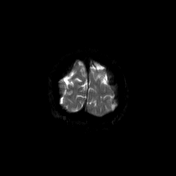
[im 48/48]
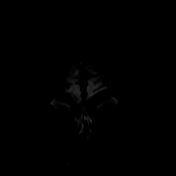

[Series 14: DWI · coronal · 5.0mm · 1.31mm/px · 1 of 24 slices shown (2 of 2)]
[im 1/24]
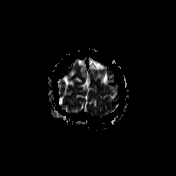

[Series 19: T1 · sagittal · 3.0mm · 0.69mm/px · 1 of 15 slices shown (3 of 5)]
[im 1/15]
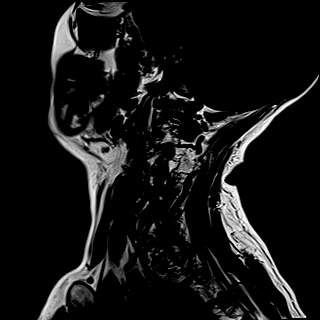

[Series 20: STIR · sagittal · 3.0mm · 0.86mm/px · 1 of 15 slices shown (1 of 3)]
[im 1/15]
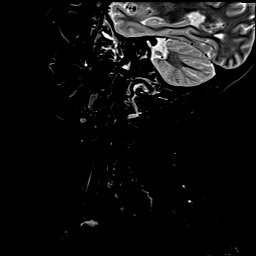

[Series 21: T2 · sagittal · 3.0mm · 0.69mm/px · 1 of 15 slices shown (3 of 7)]
[im 1/15]
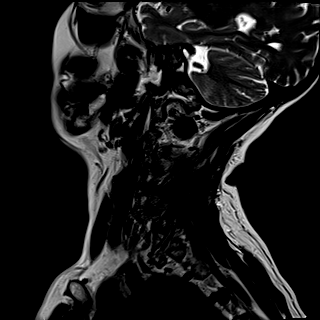

[Series 22: T2 · axial · 3.0mm · 0.70mm/px · 1 of 27 slices shown (4 of 7)]
[im 1/27]
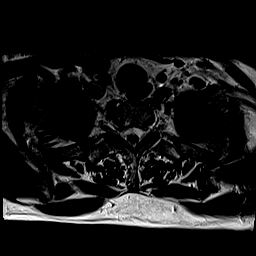

[Series 23: GRE · axial · 3.0mm · 0.35mm/px · 1 of 28 slices shown (2 of 2)]
[im 1/28]
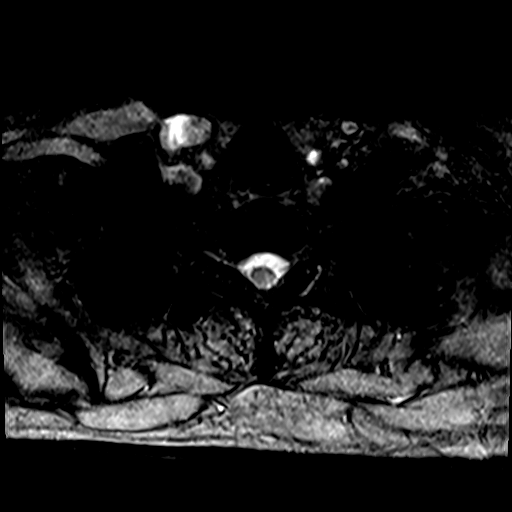

[Series 39: T1 · sagittal · 4.0mm · 1.72mm/px · 1 of 7 slices shown (4 of 5)]
[im 1/7]
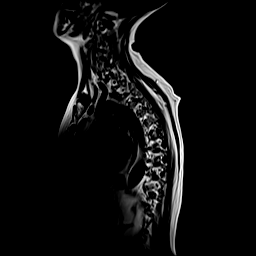

[Series 40: STIR · sagittal · 3.0mm · 1.00mm/px · 1 of 16 slices shown (2 of 3)]
[im 1/16]
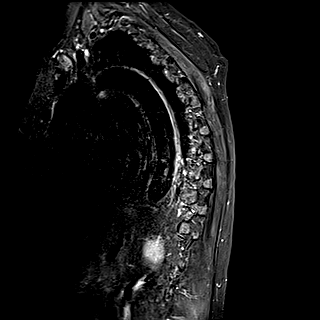

[Series 40: STIR · sagittal · 3.0mm · 1.00mm/px · 1 of 16 slices shown (3 of 3)]
[im 1/16]
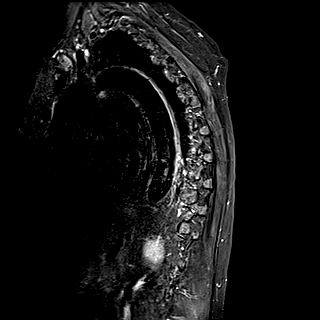

[Series 41: T1 · sagittal · 3.0mm · 1.00mm/px · 1 of 16 slices shown (5 of 5)]
[im 1/16]
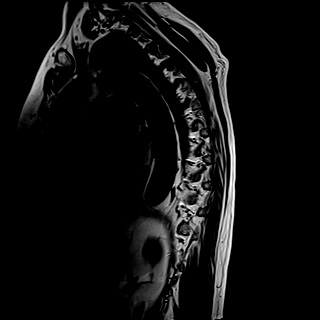

[Series 42: T2 · sagittal · 3.0mm · 0.83mm/px · 1 of 16 slices shown (5 of 7)]
[im 1/16]
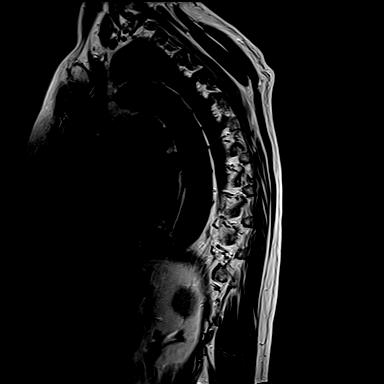

[Series 42: T2 · sagittal · 3.0mm · 0.83mm/px · 1 of 16 slices shown (6 of 7)]
[im 1/16]
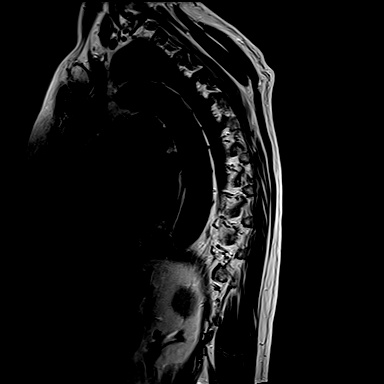

[Series 43: T2 · axial · 4.0mm · 0.78mm/px · 1 of 39 slices shown (7 of 7)]
[im 1/39]
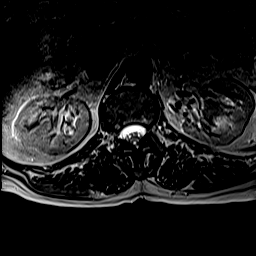

[Series 44: t2_me2d_tra · axial · 4.0mm · 0.39mm/px · 1 of 39 slices shown]
[im 1/39]
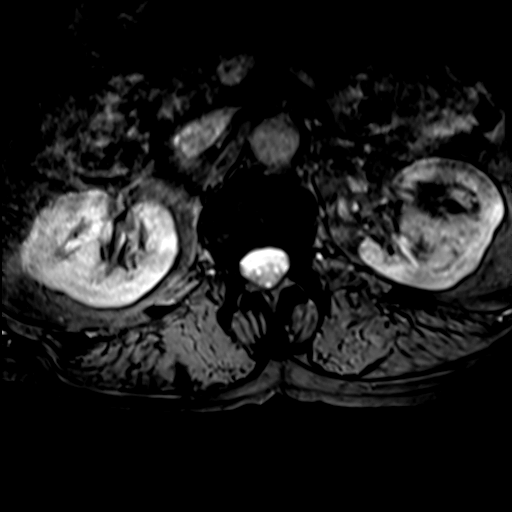

[32 of 48 positions shown; findings below may reference images not displayed]

FINDINGS: MRI CERVICAL SPINE FINDINGS

Alignment: Slight retrolisthesis C4-5 otherwise normal alignment.

Vertebrae: Negative for fracture or mass.

Cord: Normal signal and morphology

Posterior Fossa, vertebral arteries, paraspinal tissues: Negative
for mass or hematoma

Disc levels:

C2-3: Negative

C3-4: Tiny central disc protrusion. Negative for spinal or foraminal
stenosis

C4-5: Mild retrolisthesis. Bilateral uncinate spurring right greater
than left. Cord flattening and mild to moderate spinal stenosis
right greater than left. Moderate foraminal stenosis bilaterally due
to spurring. Mild facet degeneration bilaterally.

C5-6: Small central disc protrusion with mild spinal stenosis.
Neural foramina patent bilaterally

C6-7: Small central disc protrusion. Neural foramina patent
bilaterally

C7-T1: Negative

MRI THORACIC SPINE FINDINGS

Alignment:  Normal

Vertebrae: Negative for fracture or mass. Mild edema to the right of
T8-9 appears to be related to costovertebral degenerative change.

Cord:  Normal signal morphology.  No cord compression.

Paraspinal and other soft tissues: No paraspinous mass or hematoma.
No pleural effusion.

Disc levels:

Mild disc degeneration in the thoracic spine at multiple levels with
disc space narrowing and disc desiccation. No significant spinal
stenosis. Mild spurring on the right at T3-4. Mild spurring on the
right at T6-7. No focal disc protrusion.
IMPRESSION: 1. Negative for cervical spine fracture.
2. Mild to moderate degenerative change cervical spine. Spinal
stenosis right greater left at C4-5 due to spurring. Moderate
foraminal stenosis bilaterally. Mild spinal stenosis C5-6.
3. Negative for thoracic spine fracture. Mild degenerative change.
No cord compression.

## 2021-12-31 IMAGING — MR MR HEAD W/O CM
21 of 26 series · 28 of 48 positions shown · non-contrast
Comparison: MRI head 01/30/2019

CLINICAL DATA: Fall 06/03/2020. Gait abnormality. Neuro deficit
acute.

EXAM:
MRI HEAD WITHOUT CONTRAST
TECHNIQUE: Multiplanar, multiecho pulse sequences of the brain and surrounding
structures were obtained without intravenous contrast.

[Series 5: dwi_tracew · axial · 3.0mm · 1.08mm/px · z∈[-2,+138]mm · 3 of 102 slices shown]
[im 1/102]
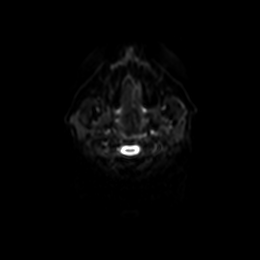
[im 51/102]
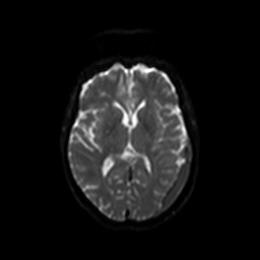
[im 102/102]
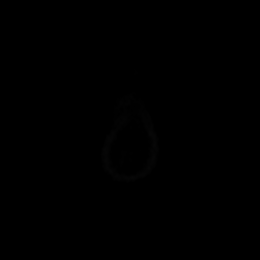

[Series 6: dwi_adc · axial · 3.0mm · 1.08mm/px · 1 of 51 slices shown]
[im 1/51]
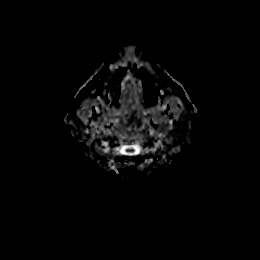

[Series 7: GRE · axial · 3.0mm · 0.45mm/px · 1 of 51 slices shown (1 of 2)]
[im 1/51]
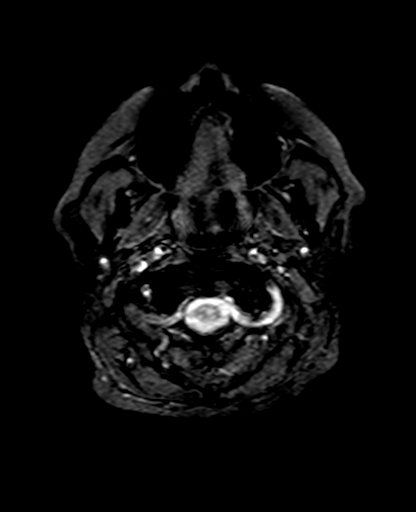

[Series 8: T1 · sagittal · 5.0mm · 0.75mm/px · 1 of 24 slices shown (1 of 5)]
[im 1/24]
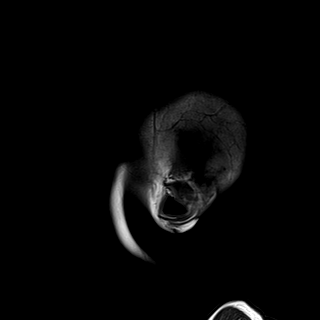

[Series 9: FLAIR · axial · 3.0mm · 0.86mm/px · 1 of 51 slices shown]
[im 1/51]
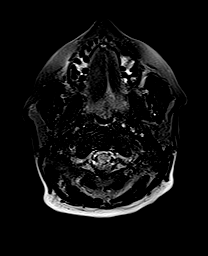

[Series 10: T1 · axial · 1.0mm · 0.94mm/px · z∈[-7,+127]mm · 5 of 144 slices shown (2 of 5)]
[im 1/144]
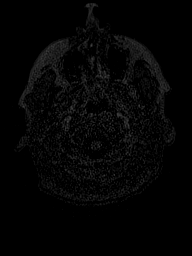
[im 36/144]
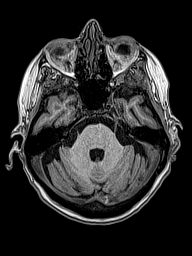
[im 72/144]
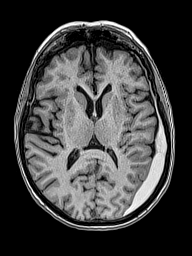
[im 108/144]
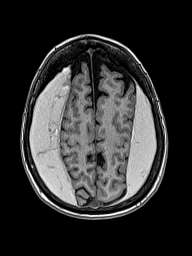
[im 144/144]
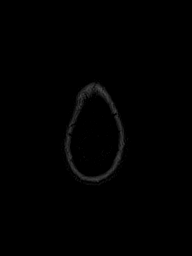

[Series 11: T2 · axial · 5.0mm · 0.45mm/px · 1 of 24 slices shown (1 of 6)]
[im 1/24]
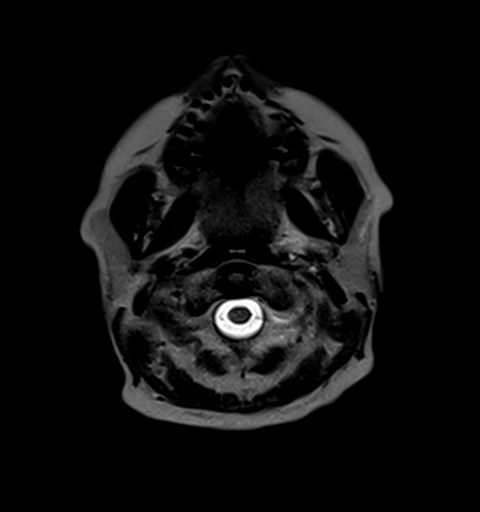

[Series 12: T2 · coronal · 5.0mm · 0.57mm/px · 1 of 28 slices shown (2 of 6)]
[im 1/28]
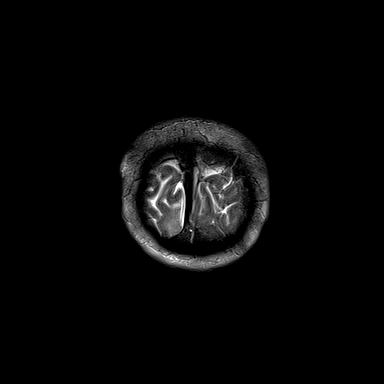

[Series 13: DWI · coronal · 5.0mm · 1.31mm/px · 2 of 48 slices shown (1 of 2)]
[im 1/48]
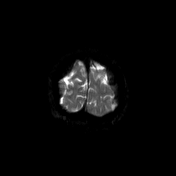
[im 48/48]
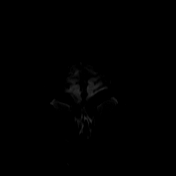

[Series 14: DWI · coronal · 5.0mm · 1.31mm/px · 1 of 24 slices shown (2 of 2)]
[im 1/24]
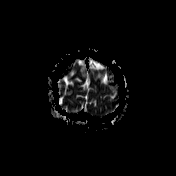

[Series 19: T1 · sagittal · 3.0mm · 0.69mm/px · 1 of 15 slices shown (3 of 5)]
[im 1/15]
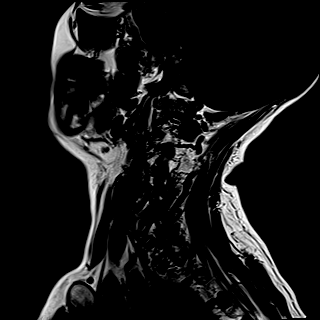

[Series 20: STIR · sagittal · 3.0mm · 0.86mm/px · 1 of 15 slices shown (1 of 2)]
[im 1/15]
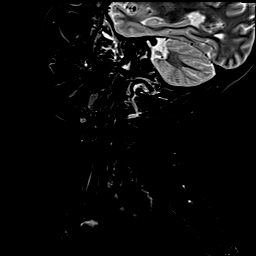

[Series 21: T2 · sagittal · 3.0mm · 0.69mm/px · 1 of 15 slices shown (3 of 6)]
[im 1/15]
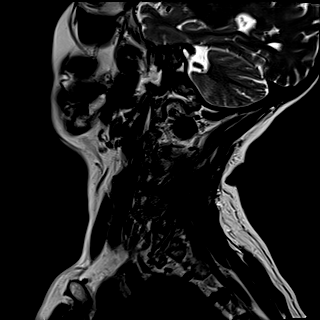

[Series 22: T2 · axial · 3.0mm · 0.70mm/px · 1 of 27 slices shown (4 of 6)]
[im 1/27]
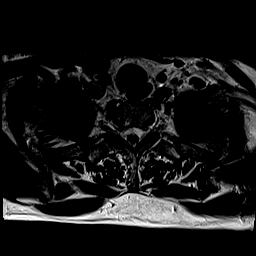

[Series 23: GRE · axial · 3.0mm · 0.35mm/px · 1 of 28 slices shown (2 of 2)]
[im 1/28]
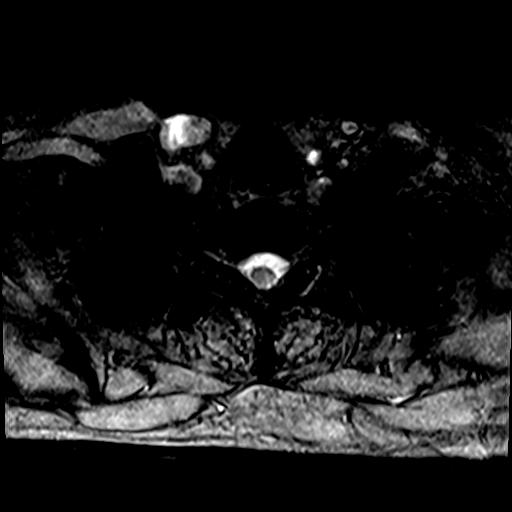

[Series 39: T1 · sagittal · 4.0mm · 1.72mm/px · 1 of 7 slices shown (4 of 5)]
[im 1/7]
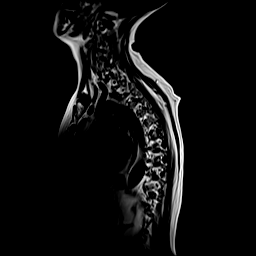

[Series 40: STIR · sagittal · 3.0mm · 1.00mm/px · 1 of 16 slices shown (2 of 2)]
[im 1/16]
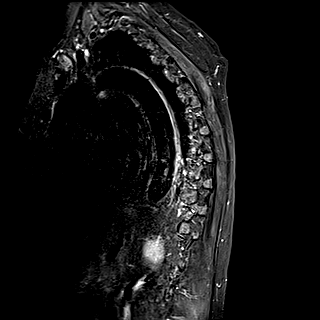

[Series 41: T1 · sagittal · 3.0mm · 1.00mm/px · 1 of 16 slices shown (5 of 5)]
[im 1/16]
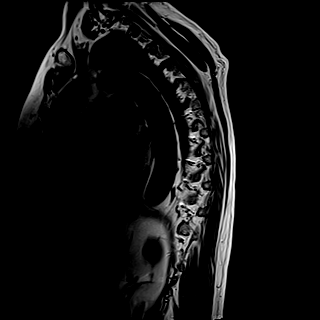

[Series 42: T2 · sagittal · 3.0mm · 0.83mm/px · 1 of 16 slices shown (5 of 6)]
[im 1/16]
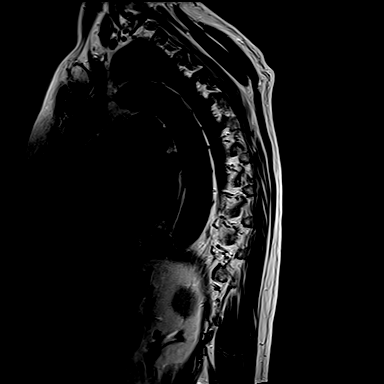

[Series 43: T2 · axial · 4.0mm · 0.78mm/px · 1 of 39 slices shown (6 of 6)]
[im 1/39]
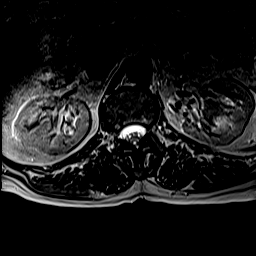

[Series 44: t2_me2d_tra · axial · 4.0mm · 0.39mm/px · 1 of 39 slices shown]
[im 1/39]
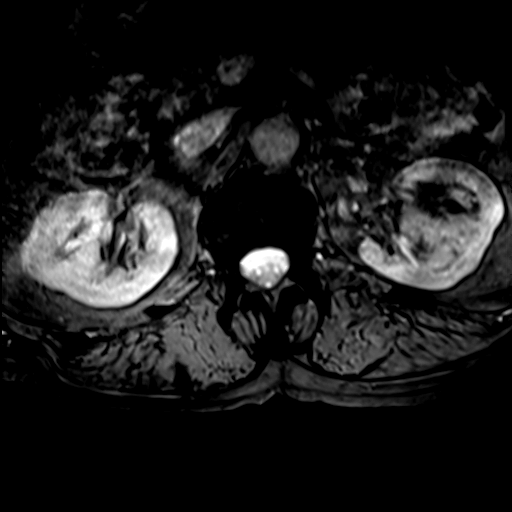

[28 of 48 positions shown; findings below may reference images not displayed]

FINDINGS: Brain: Large subdural hematomas are present bilaterally measuring
approximately 18 mm in thickness on the right and 19 mm in thickness
on the left. These appear to be subacute subdural hematomas with
methemoglobin present throughout the fluid collection bilaterally.
There is compression of the underlying brain without significant
brain edema. No midline shift.

Ventricle size is normal. No acute infarct. Patchy white matter
hyperintensity bilaterally compatible with mild chronic
microvascular ischemic change similar to the prior MRI. Negative for
mass lesion.

Vascular: Normal arterial flow voids

Skull and upper cervical spine: No focal skeletal lesion.

Sinuses/Orbits: Paranasal sinuses clear.  Negative orbit.

Other: None
IMPRESSION: Large subacute subdural hematomas bilaterally. There is significant
mass-effect on both cerebral hemispheres without midline shift.

Negative for acute infarct. Mild chronic microvascular ischemic
changes in the white matter.

These results were called by telephone at the time of interpretation
on 06/23/2020 at [DATE] to provider ALDADILMIS BILGEHLI , who verbally
acknowledged these results. Dr. Oxendine requested completion of the MRI
of the cervical and thoracic spine and transfer of the patient to
[HOSPITAL] emergency room for neuro surgical evaluation.

## 2022-01-21 DIAGNOSIS — S0003XA Contusion of scalp, initial encounter: Secondary | ICD-10-CM | POA: Diagnosis not present

## 2022-01-21 DIAGNOSIS — S0990XA Unspecified injury of head, initial encounter: Secondary | ICD-10-CM | POA: Diagnosis not present

## 2022-01-21 DIAGNOSIS — I10 Essential (primary) hypertension: Secondary | ICD-10-CM | POA: Diagnosis not present

## 2022-01-21 DIAGNOSIS — Z043 Encounter for examination and observation following other accident: Secondary | ICD-10-CM | POA: Diagnosis not present

## 2022-01-21 DIAGNOSIS — S0120XA Unspecified open wound of nose, initial encounter: Secondary | ICD-10-CM | POA: Diagnosis not present

## 2022-01-21 DIAGNOSIS — S0180XA Unspecified open wound of other part of head, initial encounter: Secondary | ICD-10-CM | POA: Diagnosis not present

## 2022-01-26 ENCOUNTER — Other Ambulatory Visit: Payer: Self-pay

## 2022-01-26 ENCOUNTER — Encounter: Payer: Self-pay | Admitting: Family Medicine

## 2022-01-26 ENCOUNTER — Ambulatory Visit: Payer: Medicare Other | Attending: Obstetrics and Gynecology | Admitting: Physical Therapy

## 2022-01-26 ENCOUNTER — Ambulatory Visit (INDEPENDENT_AMBULATORY_CARE_PROVIDER_SITE_OTHER): Payer: Medicare Other | Admitting: Family Medicine

## 2022-01-26 VITALS — BP 132/77 | HR 82 | Temp 97.1°F | Ht 63.0 in | Wt 179.2 lb

## 2022-01-26 DIAGNOSIS — W19XXXD Unspecified fall, subsequent encounter: Secondary | ICD-10-CM

## 2022-01-26 DIAGNOSIS — S0083XD Contusion of other part of head, subsequent encounter: Secondary | ICD-10-CM | POA: Diagnosis not present

## 2022-01-26 DIAGNOSIS — R42 Dizziness and giddiness: Secondary | ICD-10-CM | POA: Diagnosis not present

## 2022-01-26 DIAGNOSIS — S0031XD Abrasion of nose, subsequent encounter: Secondary | ICD-10-CM | POA: Diagnosis not present

## 2022-01-26 DIAGNOSIS — R269 Unspecified abnormalities of gait and mobility: Secondary | ICD-10-CM | POA: Insufficient documentation

## 2022-01-26 DIAGNOSIS — R2681 Unsteadiness on feet: Secondary | ICD-10-CM | POA: Diagnosis not present

## 2022-01-26 NOTE — Patient Instructions (Addendum)
Please return in 6 months for your annual complete physical; please come fasting.  ? ?Please discontinue all alcohol for your safety.  ?Walk with a cane always.  ? ?If you have any questions or concerns, please don't hesitate to send me a message via MyChart or call the office at [phone]. Thank you for visiting with Amanda Collier today! It's our pleasure caring for you.  ? ?Fall Prevention in the Home, Adult ?Falls can cause injuries and affect people of all ages. There are many simple things that you can do to make your home safe and to help prevent falls. Ask for help when making these changes, if needed. ?What actions can I take to prevent falls? ?General instructions ?Use good lighting in all rooms. Replace any light bulbs that burn out, turn on lights if it is dark, and use night-lights. ?Place frequently used items in easy-to-reach places. Lower the shelves around your home if necessary. ?Set up furniture so that there are clear paths around it. Avoid moving your furniture around. ?Remove throw rugs and other tripping hazards from the floor. ?Avoid walking on wet floors. ?Fix any uneven floor surfaces. ?Add color or contrast paint or tape to grab bars and handrails in your home. Place contrasting color strips on the first and last steps of staircases. ?When you use a stepladder, make sure that it is completely opened and that the sides and supports are firmly locked. Have someone hold the ladder while you are using it. Do not climb a closed stepladder. ?Know where your pets are when moving through your home. ?What can I do in the bathroom? ?  ?Keep the floor dry. Immediately clean up any water that is on the floor. ?Remove soap buildup in the tub or shower regularly. ?Use nonskid mats or decals on the floor of the tub or shower. ?Attach bath mats securely with double-sided, nonslip rug tape. ?If you need to sit down while you are in the shower, use a plastic, nonslip stool. ?Install grab bars by the toilet and in  the tub and shower. Do not use towel bars as grab bars. ?What can I do in the bedroom? ?Make sure that a bedside light is easy to reach. ?Do not use oversized bedding that reaches the floor. ?Have a firm chair that has side arms to use for getting dressed. ?What can I do in the kitchen? ?Clean up any spills right away. ?If you need to reach for something above you, use a sturdy step stool that has a grab bar. ?Keep electrical cables out of the way. ?Do not use floor polish or wax that makes floors slippery. If you must use wax, make sure that it is non-skid floor wax. ?What can I do with my stairs? ?Do not leave any items on the stairs. ?Make sure that you have a light switch at the top and the bottom of the stairs. Have them installed if you do not have them. ?Make sure that there are handrails on both sides of the stairs. Fix handrails that are broken or loose. Make sure that handrails are as long as the staircases. ?Install non-slip stair treads on all stairs in your home. ?Avoid having throw rugs at the top or bottom of stairs, or secure the rugs with carpet tape to prevent them from moving. ?Choose a carpet design that does not hide the edge of steps on the stairs. ?Check any carpeting to make sure that it is firmly attached to the stairs. Fix any carpet that  is loose or worn. ?What can I do on the outside of my home? ?Use bright outdoor lighting. ?Regularly repair the edges of walkways and driveways and fix any cracks. ?Remove high doorway thresholds. ?Trim any shrubbery on the main path into your home. ?Regularly check that handrails are securely fastened and in good repair. Both sides of all steps should have handrails. ?Install guardrails along the edges of any raised decks or porches. ?Clear walkways of debris and clutter, including tools and rocks. ?Have leaves, snow, and ice cleared regularly. ?Use sand or salt on walkways during winter months. ?In the garage, clean up any spills right away, including  grease or oil spills. ?What other actions can I take? ?Wear closed-toe shoes that fit well and support your feet. Wear shoes that have rubber soles or low heels. ?Use mobility aids as needed, such as canes, walkers, scooters, and crutches. ?Review your medicines with your health care provider. Some medicines can cause dizziness or changes in blood pressure, which increase your risk of falling. ?Talk with your health care provider about other ways that you can decrease your risk of falls. This may include working with a physical therapist or trainer to improve your strength, balance, and endurance. ?Where to find more information ?Centers for Disease Control and Prevention, [employer]: http://www.wolf.info/ ?[employer] on Aging: http://kim-miller.com/ ?Contact a health care provider if: ?You are afraid of falling at home. ?You feel weak, drowsy, or dizzy at home. ?You fall at home. ?Summary ?There are many simple things that you can do to make your home safe and to help prevent falls. ?Ways to make your home safe include removing tripping hazards and installing grab bars in the bathroom. ?Ask for help when making these changes in your home. ?This information is not intended to replace advice given to you by your health care provider. Make sure you discuss any questions you have with your health care provider. ?Document Revised: 06/02/2020 Document Reviewed: 06/02/2020 ?Elsevier Patient Education ? [place]. ? ?

## 2022-01-26 NOTE — Progress Notes (Signed)
? ?Subjective  ?CC:  ?Chief Complaint  ?Patient presents with  ? Fall  ?  Last Friday. She denies dizziness and headache due to the fall.  ? ? ?HPI: Amanda Collier is a 76 y.o. female who presents to the office today to address the problems listed above in the chief complaint. ?76 year old presents to follow-up after a significant fall.  Date of injury occurred March 10.  She was in [place] visiting friends.  She had had about 3 glasses of wine a night.  Was walking back in the hallway of her home which is carpeted and face planted.  Her husband noted that she was going down.  She does not remember any symptoms prior to the fall: Including negative palpitations, lightheadedness, weakness, headache.  She has a chronically unsteady gait, wide-based, possibly cerebellar related from chronic alcohol use in the past.  No Lasix when she has decreased alcohol use significantly.  She has a history of a subdural hematoma and history of TIA she is on Plavix.  She was evaluated in the emergency room.  Do not have records for review but she reports that she had a negative CT scan of the brain and normal vitals.  She was discharged without medications.  Since, she has done fine.  No mental status changes, paresis, changes in gait, visual disturbance or significant pain.  Her face is sore from the bruising. ? ?Assessment  ?1. Fall, subsequent encounter   ?2. Contusion of face, subsequent encounter   ?3. Abrasion, nose w/o infection   ?4. Unsteady gait   ? ?  ?Plan  ?Fall: Suspect related to alcohol use, chronically unsteady gait and tripping.  She did not brace herself with her hands.  Has significant contusions and abrasions.  We discussed cessation of all alcohol due to high fall risk.  Patient agrees.  No symptoms of stroke or cardiac related fall.  There is no loss of consciousness.  Discussed fall prevention, using a cane chronically.  Discussed wound care to abrasions. ? ?Follow up: 6 months for complete physical ?Visit  date not found ? ?No orders of the defined types were placed in this encounter. ? ?No orders of the defined types were placed in this encounter. ? ?  ? ?I reviewed the patients updated PMH, FH, and SocHx.  ?  ?Patient Active Problem List  ? Diagnosis Date Noted  ? Controlled type 2 diabetes mellitus without complication, without long-term current use of insulin ([place]) 12/13/2021  ?  Priority: High  ? History of subdural hematoma 12/13/2021  ?  Priority: High  ? Hx of traumatic brain injury 03/09/2021  ?  Priority: High  ? Combined hyperlipidemia associated with type 2 diabetes mellitus ([place]) 12/26/2020  ?  Priority: High  ? 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[place]) 12/24/2020  ?  Priority: High  ? Essential hypertension 12/17/2015  ?  Priority: High  ? Personal history of colonic polyps 12/24/2020  ?  Priority: Medium   ? Neurogenic bladder   ?  Priority: Medium   ? 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  Priority: Medium   ? Spinal stenosis in cervical region   ?  Priority: Medium   ? Unsteady gait 06/23/2020  ?  Priority: Medium   ? Headache syndrome 12/18/2016  ?  Priority: Medium   ? Heart murmur 03/21/2021  ?  Priority: Low  ? Right-sided sensorineural hearing loss 09/29/2020  ?  Priority: Low  ?  Status post craniotomy 06/24/2020  ?  Priority: Low  ? ?Current Meds  ?Medication Sig  ? acetaminophen (TYLENOL) 650 MG CR tablet Take 650 mg by mouth every 8 (eight) hours as needed for pain.  ? atorvastatin (LIPITOR) 10 MG tablet Take 1 tablet (10 mg total) by mouth at bedtime.  ? clopidogrel (PLAVIX) 75 MG tablet Take 1 tablet (75 mg total) by mouth daily.  ? divalproex (DEPAKOTE ER) 250 MG 24 hr tablet Take 1-3 tablets (250-750 mg total) by mouth See admin instructions. Take 250 mg by mouth in the morning and 750 mg at bedtime  ? losartan (COZAAR) 25 MG tablet Take 1 tablet (25 mg total) by mouth daily.  ? topiramate (TOPAMAX) 50 MG tablet Take 1 tablet (50 mg total) by mouth at  bedtime.  ? Vitamin D, Ergocalciferol, (DRISDOL) 1.25 MG (50000 UNIT) CAPS capsule TAKE 1 CAPSULE BY MOUTH ONCE EVERY WEDNESDAY  ? zolpidem (AMBIEN) 10 MG tablet Take 10 mg by mouth at bedtime as needed.  ? ? ?Allergies: ?Patient is allergic to codeine, lexapro [escitalopram oxalate], lisinopril, and lotensin [benazepril hcl]. ?Family History: ?Patient family history includes Heart attack in her maternal uncle. She was adopted. ?Social History:  ?Patient  reports that she has never smoked. She has never used smokeless tobacco. She reports current alcohol use of about 7.0 standard drinks per week. She reports that she does not use drugs. ? ?Review of Systems: ?Constitutional: Negative for fever malaise or anorexia ?Cardiovascular: negative for chest pain ?Respiratory: negative for SOB or persistent cough ?Gastrointestinal: negative for abdominal pain ? ?Objective  ?Vitals: BP 132/77   Pulse 82   Temp (!) 97.1 ?F (36.2 ?C) (Temporal)   Ht '5\' 3"'$  (1.6 m)   Wt 179 lb 3.2 oz (81.3 kg)   LMP 11/14/2003   SpO2 97%   BMI 31.74 kg/m?  ?General: no acute distress , A&Ox3 ?HEENT: PEERL, conjunctiva normal, neck is supple, bilateral ecchymosis periorbitally, large abrasion beneath left nostril with crusting. ?Cardiovascular:  RRR without murmur or gallop.  ?Respiratory:  Good breath sounds bilaterally, CTAB with normal respiratory effort ?Skin:  Warm, no rashes ?Gait: Wide-based ? ? ? ?Commons side effects, risks, benefits, and alternatives for medications and treatment plan prescribed today were discussed, and the patient expressed understanding of the given instructions. Patient is instructed to call or message via MyChart if he/she has any questions or concerns regarding our treatment plan. No barriers to understanding were identified. We discussed Red Flag symptoms and signs in detail. Patient expressed understanding regarding what to do in case of urgent or emergency type symptoms.  ?Medication list was reconciled,  printed and provided to the patient in AVS. Patient instructions and summary information was reviewed with the patient as documented in the AVS. ?This note was prepared with assistance of Systems analyst. Occasional wrong-word or sound-a-like substitutions may have occurred due to the inherent limitations of voice recognition software ? ?This visit occurred during the SARS-CoV-2 public health emergency.  Safety protocols were in place, including screening questions prior to the visit, additional usage of staff PPE, and extensive cleaning of exam room while observing appropriate contact time as indicated for disinfecting solutions.  ? ?

## 2022-01-26 NOTE — Therapy (Signed)
[facility] ?[place], [place], [address] ?Phone: [phone]   Fax:  [phone] ? ?Physical Therapy Treatment ? ?Patient Details  ?Name: Amanda Collier ?MRN: [id number] ?Date of Birth: [birth year] ?Referring Provider (PT): Jaquita Folds, MD ? ? ?Encounter Date: 01/26/2022 ? ? PT End of Session - 01/26/22 1453   ? ? Visit Number 3   ? Date for PT Re-Evaluation 01/29/22   ? Authorization Type Medicare/ AARP   ? Progress Note Due on Visit 10   ? PT Start Time 6433   ? PT Stop Time 1512   pt denied additional treatment  ? PT Time Calculation (min) 23 min   ? Activity Tolerance Patient tolerated treatment well   ? Behavior During Therapy [facility] for tasks assessed/performed   ? ?  ?  ? ?  ? ? ?Past Medical History:  ?Diagnosis Date  ? Anxiety   ? Diabetes mellitus without complication ([place])   ? Headache syndrome 12/18/2016  ? Heart murmur 03/21/2021  ? Last echo 2018; calcified aortic valve w/o regurg or stenosis. Nl EF.   ? High blood pressure   ? Hypertension   ? followed by Dr Gillian Shields medications  ? TIA (transient ischemic attack) 07/2021  ? ? ?Past Surgical History:  ?Procedure Laterality Date  ? CRANIOTOMY Bilateral 06/24/2020  ? Procedure: Bilateral craniotomy for evacuation of subdural hematoma;  Surgeon: Ashok Pall, MD;  Location: [place];  Service: Neurosurgery;  Laterality: Bilateral;  ? CRANIOTOMY Right 06/26/2020  ? Procedure: CRANIOTOMY HEMATOMA EVACUATION SUBDURAL;  Surgeon: Ashok Pall, MD;  Location: [facility];  Service: Neurosurgery;  Laterality: Right;  ? ? ?There were no vitals filed for this visit. ? ? Subjective Assessment - 01/26/22 1453   ? ? Subjective Pt reports she is no longer having leakage, only getting up one time per night now without leakage. Pt reports she does travel fairly often with at least 5 hour flights and only getting up once per trip with this but no leakage. Pt has cut back on several bladder  irritants. Pt did have a fall last week and did go to the doctor after this. Pt reports she is better now but still has bruising on both eyes.   ? Pertinent History sept 15 2021 TIA   ? How long can you sit comfortably? no limits   ? How long can you stand comfortably? no limits   ? How long can you walk comfortably? no limits   ? Patient Stated Goals to have less leakage and urgency   ? Currently in Pain? Yes   ? Pain Score 2    ? Pain Location Face   from fall  ? Pain Descriptors / Indicators Aching   ? ?  ?  ? ?  ? ? ? ? ? ? ? ? ? ? ? ? ? ? ? ? ? Pelvic Floor Special Questions - 01/26/22 0001   ? ? Pelvic Floor Internal Exam pt deferred   ? ?  ?  ? ?  ? ? ? ? [facility] Adult PT Treatment/Exercise - 01/26/22 0001   ? ?  ? Self-Care  ? Self-Care Other Self-Care Comments   ? Other Self-Care Comments  Pt educated on continuing HEP, maintain bladder habits and training for maintaining gains   ? ?  ?  ? ?  ? ? ? ? ? ? ? ? ? ? ? ? PT Short Term Goals -  01/26/22 1515   ? ?  ? PT SHORT TERM GOAL #1  ? Title Pt to be I with HEP   ? Time 4   ? Status Achieved   ? Target Date 11/28/21   ?  ? PT SHORT TERM GOAL #2  ? Title Pt to demonstrate at least 4/5 pelvic floor strength and ability to hold for 5s for decreased urinary incontinence symptoms   ? Time 4   ? Period Weeks   ? Status Unable to assess   pt deferred retesting as she is no longer having symptoms  ? Target Date 11/28/21   ? ?  ?  ? ?  ? ? ? ? PT Long Term Goals - 01/26/22 1516   ? ?  ? PT LONG TERM GOAL #1  ? Title pt to be Ind with advanced HEP   ? Time 3   ? Period Months   ? Status Achieved   ? Target Date 01/29/22   ?  ? PT LONG TERM GOAL #2  ? Title Pt to demonstrate at least 4/5 pelvic floor strength and ability to hold for 8s for decreased urinary incontinence symptoms   ? Time 3   ? Period Months   ? Status Unable to assess   pt declined reassess as she is no longer having symptoms  ? Target Date 01/29/22   ?  ? PT LONG TERM GOAL #3  ? Title Pt to demonstrate  at least 4/5 bil hip strength for improved pelvic stability and decreased incontinence symptoms   ? Time 3   ? Period Months   ? Status Achieved   ? Target Date 01/29/22   ?  ? PT LONG TERM GOAL #4  ? Title pt to rpeort no more than 1 leak per week for improved QOL   ? Time 3   ? Period Months   ? Status Achieved   ? Target Date 01/29/22   ? ?  ?  ? ?  ? ? ? ? ? ? ? ? Plan - 01/26/22 1513   ? ? Clinical Impression Statement Pt presents to clinic  reporting she no longer has any leakage, very pleased with progress, denies additional needs for PT. Pt did have a fall last week and presents today with bruised eyes and nose and forehead. Pt reports she was seen by MD after fall but still very sore and denied treatment after education and DC recommendations. Pt reports she understands her HEP, will continue her bladder training and understands bladder irritants. Pt has met all goals and this will serve as her DC from PT, understands she will need a new referral for future PT needs.   ? Personal Factors and Comorbidities Age;Time since onset of injury/illness/exacerbation;Comorbidity 1   ? Comorbidities recent TIA, x2 vaginal births   ? Examination-Activity Limitations Locomotion Level;Continence;Hygiene/Grooming;Stairs   ? Examination-Participation Restrictions Community Activity;Shop   ? Stability/Clinical Decision Making Stable/Uncomplicated   ? Rehab Potential Good   ? PT Frequency 1x / week   ? PT Duration Other (comment)   10 visits  ? PT Treatment/Interventions ADLs/Self Care Home Management;Functional mobility training;Therapeutic activities;Therapeutic exercise;Neuromuscular re-education;Manual techniques;Patient/family education;Passive range of motion;Energy conservation   ? PT Home Exercise Plan [phone]   ? Consulted and Agree with Plan of Care Patient   ? ?  ?  ? ?  ? ? ?Patient will benefit from skilled therapeutic intervention in order to improve the following deficits and impairments:  Decreased  coordination,  Decreased endurance, Difficulty walking, Impaired flexibility, Improper body mechanics, Postural dysfunction, Decreased strength, Decreased mobility, Decreased balance ? ?Visit Diagnosis: ?Abnormality of gait and mobility ? ? ? ? ?Problem List ?Patient Active Problem List  ? Diagnosis Date Noted  ? Controlled type 2 diabetes mellitus without complication, without long-term current use of insulin ([place]) 12/13/2021  ? History of subdural hematoma 12/13/2021  ? Heart murmur 03/21/2021  ? Hx of traumatic brain injury 03/09/2021  ? Combined hyperlipidemia associated with type 2 diabetes mellitus ([place]) 12/26/2020  ? 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[place]) 12/24/2020  ? Personal history of colonic polyps 12/24/2020  ? Right-sided sensorineural hearing loss 09/29/2020  ? Neurogenic bladder   ? 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 Spinal stenosis in cervical region   ? Status post craniotomy 06/24/2020  ? Unsteady gait 06/23/2020  ? Headache syndrome 12/18/2016  ? Essential hypertension 12/17/2015  ? ?PHYSICAL THERAPY DISCHARGE SUMMARY ? ?Visits from Start of Care: 3 ? ?Current functional level related to goals / functional outcomes: ?Pt denies any continued symptoms with overactive bladder as stated on referral  ?  ?Remaining deficits: ?Pt no longer has leakage, urgency or increased frequency. Pt pleased with results ?  ?Education / Equipment: ?HEP  ? ?Patient agrees to discharge. Patient goals were  All goals met except pelvic floor strength goal as pt deferred having this reassessed today as she is no longer having symptoms and status post fall did not want additional treatment . Patient is being discharged due to being pleased with the current functional level. Thank you for the referral.  ? ?Stacy Gardner, PT, DPT ?03/16/233:18 PM  ? ? ?[facility] ?[place], [place], [address] ?Phone: [phone]    Fax:  [phone] ? ?Name: Amanda Collier ?MRN: [id number] ?Date of Birth: [birth year] ? ? ? ?

## 2022-02-01 ENCOUNTER — Other Ambulatory Visit: Payer: Self-pay

## 2022-02-01 ENCOUNTER — Ambulatory Visit (INDEPENDENT_AMBULATORY_CARE_PROVIDER_SITE_OTHER): Payer: Medicare Other | Admitting: Obstetrics and Gynecology

## 2022-02-01 ENCOUNTER — Encounter: Payer: Self-pay | Admitting: Obstetrics and Gynecology

## 2022-02-01 VITALS — BP 150/83 | HR 81

## 2022-02-01 DIAGNOSIS — N3281 Overactive bladder: Secondary | ICD-10-CM

## 2022-02-01 NOTE — Progress Notes (Signed)
[facility] ?Return Visit ? ?SUBJECTIVE  ?History of Present Illness: ?Amanda Collier is a 76 y.o. female seen in follow-up for overactive bladder. Plan at last visit was to start physical therapy.  ? ?Since last visit, had a TIA. Has been unsteady on her feet and had a fall recently. Now works with a cane.  ? ?Things have been going well with physical therapy and dietary changes. Limits tomato and citrus. Only drinks decaf tea now. Wear two poise mini pads. Not leaking every day. Only wakes up occasionally in the middle of the night to urinate (more if she is woken up by something else). During the day, voids normally.  ? ?Still not symptomatic from her prolapse.  ? ?Past Medical History: ?Patient  has a past medical history of Anxiety, Diabetes mellitus without complication ([place]), Headache syndrome (12/18/2016), Heart murmur (03/21/2021), High blood pressure, Hypertension, and TIA (transient ischemic attack) (07/2021).  ? ?Past Surgical History: ?She  has a past surgical history that includes Craniotomy (Bilateral, 06/24/2020) and Craniotomy (Right, 06/26/2020).  ? ?Medications: ?She has a current medication list which includes the following prescription(s): acetaminophen, atorvastatin, clopidogrel, divalproex, losartan, topiramate, vitamin d (ergocalciferol), and zolpidem.  ? ?Allergies: ?Patient is allergic to codeine, lexapro [escitalopram oxalate], lisinopril, and lotensin [benazepril hcl].  ? ?Social History: ?Patient  reports that she has never smoked. She has never used smokeless tobacco. She reports current alcohol use of about 7.0 standard drinks per week. She reports that she does not use drugs.  ?  ?  ?OBJECTIVE  ?  ? ?Physical Exam: ?Vitals:  ? 02/01/22 0916  ?BP: (!) 150/83  ?Pulse: 81  ? ?Gen: No apparent distress, A&O x 3. ? ?Detailed Urogynecologic Evaluation:  ?Deferred.   ? ?ASSESSMENT AND PLAN  ?  ?Amanda Collier is a 76 y.o. with:  ?1. Overactive bladder   ? ?- Improved with  behavioral modifications and physical therapy.  ?- She will continue to perform pelvic floor exercises at home.  ? ?Follow up if symptoms worsen ? ?Jaquita Folds, MD ? ?Time spent: I spent 20 minutes dedicated to the care of this patient on the date of this encounter to include pre-visit review of records, face-to-face time with the patient and post visit documentation. ? ?

## 2022-02-13 ENCOUNTER — Telehealth: Payer: Self-pay | Admitting: Family Medicine

## 2022-02-16 NOTE — Telephone Encounter (Signed)
Patient states she is getting ready to go out of the country.  She is needing enough Vit D to get her through May 20th.  Is requesting call back at [phone].

## 2022-02-20 NOTE — Telephone Encounter (Signed)
I see this was refused.   Did patient get call back?

## 2022-02-20 NOTE — Telephone Encounter (Signed)
Patient notified can continue with OTC therapy  ?

## 2022-03-16 DIAGNOSIS — Z20822 Contact with and (suspected) exposure to covid-19: Secondary | ICD-10-CM | POA: Diagnosis not present

## 2022-03-23 DIAGNOSIS — Z20822 Contact with and (suspected) exposure to covid-19: Secondary | ICD-10-CM | POA: Diagnosis not present

## 2022-04-04 ENCOUNTER — Ambulatory Visit (INDEPENDENT_AMBULATORY_CARE_PROVIDER_SITE_OTHER): Payer: Medicare Other | Admitting: Family Medicine

## 2022-04-04 ENCOUNTER — Encounter: Payer: Self-pay | Admitting: Family Medicine

## 2022-04-04 VITALS — BP 142/80 | HR 68 | Temp 98.7°F | Ht 63.0 in | Wt 179.2 lb

## 2022-04-04 DIAGNOSIS — S0180XA Unspecified open wound of other part of head, initial encounter: Secondary | ICD-10-CM | POA: Diagnosis not present

## 2022-04-04 DIAGNOSIS — I10 Essential (primary) hypertension: Secondary | ICD-10-CM | POA: Diagnosis not present

## 2022-04-04 NOTE — Progress Notes (Signed)
Subjective  CC:  Chief Complaint  Patient presents with   gash on left eye    Pt here to F/U with gash on Lt eye. She feels that it is getting better but still has concerns whether she need stiches. Pt has an appt for eye exam scheduled for 04/07/2022      HPI: Amanda Collier is a 76 y.o. female who presents to the office today to address the problems listed above in the chief complaint. Pt was on river boat cruise in [place] and slipped on wet wooden grated floor in bathroom on 03/29/22. Tried to brace herself but hit left face on floor. Has small wound. No longer bleeding. Wants it checked. No longer sore. No loc. No other injuries.  HTN: elevated today. Just returned this weekend from long trip and sleep is still off from travel. On losartan 25 daily.    Assessment  1. Open wound of face, initial encounter   2. Essential hypertension      Plan  Wound face:  almost a week out from [place]; healing well w/o infection. 2 steri strips applied. Wound care discussed HTN: mildly elevated. May be related to recent travel and jet lag. Has appt soon and will reassess. Will adjust meds up at that time if persistently elevated.   Follow up: sept for cpe  Visit date not found  No orders of the defined types were placed in this encounter.  No orders of the defined types were placed in this encounter.     I reviewed the patients updated PMH, FH, and SocHx.    Patient Active Problem List   Diagnosis Date Noted   Controlled type 2 diabetes mellitus without complication, without long-term current use of insulin ([place]) 12/13/2021    Priority: High   History of subdural hematoma 12/13/2021    Priority: High   Hx of traumatic brain injury 03/09/2021    Priority: High   Combined hyperlipidemia associated with type 2 diabetes mellitus ([place]) 12/26/2020    Priority: High   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[place]) 12/24/2020    Priority: High   Essential hypertension 12/17/2015    Priority: High    Personal history of colonic polyps 12/24/2020    Priority: Medium    Neurogenic bladder     Priority: Medium    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   clopidogrel (PLAVIX) 75 MG tablet Take 1 tablet (75 mg total) by mouth daily.   divalproex (DEPAKOTE ER) 250 MG 24 hr tablet Take 1-3 tablets (250-750 mg total) by mouth See admin instructions. Take 250 mg by mouth in the morning and 750 mg at bedtime   losartan (COZAAR) 25 MG tablet Take 1 tablet (25 mg total) by mouth daily.   topiramate (TOPAMAX) 50 MG tablet Take 1 tablet (50 mg total) by mouth at bedtime.   Vitamin D,  Ergocalciferol, (DRISDOL) 1.25 MG (50000 UNIT) CAPS capsule TAKE 1 CAPSULE BY MOUTH ONCE EVERY WEDNESDAY   zolpidem (AMBIEN) 10 MG tablet Take 10 mg by mouth at bedtime as needed.    Allergies: Patient is allergic to codeine, lexapro [escitalopram oxalate], lisinopril, and lotensin [benazepril hcl]. Family History: Patient family history includes Heart attack in her maternal uncle. She was adopted. Social History:  Patient  reports that she has never smoked. She has never used smokeless tobacco. She reports current alcohol use of about 7.0 standard drinks per week. She reports that she does not use drugs.  Review of Systems: Constitutional: Negative for fever malaise or anorexia Cardiovascular: negative for chest pain Respiratory: negative for SOB or persistent cough Gastrointestinal: negative for abdominal  pain  Objective  Vitals: BP (!) 142/80 Comment: Lt arm  Pulse 68   Temp 98.7 F (37.1 C)   Ht '5\' 3"'$  (1.6 m)   Wt 179 lb 3.2 oz (81.3 kg)   LMP 11/14/2003   SpO2 97%   BMI 31.74 kg/m  General: no acute distress , A&Ox3 HEENT: PEERL, conjunctiva normal, neck is supple, lateral to left eye: approx 1 inch healing/scabbed laceration w/o redness, heat or drainage. Ecchymosis surrounding wound is yellow and improving. No hematoma. No bony ttp.  Cardiovascular:  RRR without murmur or gallop.  Respiratory:  Good breath sounds bilaterally, CTAB with normal respiratory effort Skin:  Warm, no rashes    Commons side effects, risks, benefits, and alternatives for medications and treatment plan prescribed today were discussed, and the patient expressed understanding of the given instructions. Patient is instructed to call or message via MyChart if he/she has any questions or concerns regarding our treatment plan. No barriers to understanding were identified. We discussed Red Flag symptoms and signs in detail. Patient expressed understanding regarding what to do in case of urgent or emergency type symptoms.  Medication list was reconciled, printed and provided to the patient in AVS. Patient instructions and summary information was reviewed with the patient as documented in the AVS. This note was prepared with assistance of Dragon voice recognition software. Occasional wrong-word or sound-a-like substitutions may have occurred due to the inherent limitations of voice recognition software  This visit occurred during the SARS-CoV-2 public health emergency.  Safety protocols were in place, including screening questions prior to the visit, additional usage of staff PPE, and extensive cleaning of exam room while observing appropriate contact time as indicated for disinfecting solutions.

## 2022-04-06 DIAGNOSIS — Z23 Encounter for immunization: Secondary | ICD-10-CM | POA: Diagnosis not present

## 2022-04-07 ENCOUNTER — Encounter: Payer: Self-pay | Admitting: Family Medicine

## 2022-04-07 DIAGNOSIS — H2513 Age-related nuclear cataract, bilateral: Secondary | ICD-10-CM | POA: Diagnosis not present

## 2022-04-07 DIAGNOSIS — H5203 Hypermetropia, bilateral: Secondary | ICD-10-CM | POA: Diagnosis not present

## 2022-04-07 DIAGNOSIS — H524 Presbyopia: Secondary | ICD-10-CM | POA: Diagnosis not present

## 2022-04-07 DIAGNOSIS — H52223 Regular astigmatism, bilateral: Secondary | ICD-10-CM | POA: Diagnosis not present

## 2022-04-07 DIAGNOSIS — H35033 Hypertensive retinopathy, bilateral: Secondary | ICD-10-CM | POA: Diagnosis not present

## 2022-04-07 DIAGNOSIS — I10 Essential (primary) hypertension: Secondary | ICD-10-CM | POA: Diagnosis not present

## 2022-04-07 LAB — HM DIABETES EYE EXAM

## 2022-06-22 DIAGNOSIS — L814 Other melanin hyperpigmentation: Secondary | ICD-10-CM | POA: Diagnosis not present

## 2022-06-22 DIAGNOSIS — D2371 Other benign neoplasm of skin of right lower limb, including hip: Secondary | ICD-10-CM | POA: Diagnosis not present

## 2022-06-22 DIAGNOSIS — L578 Other skin changes due to chronic exposure to nonionizing radiation: Secondary | ICD-10-CM | POA: Diagnosis not present

## 2022-06-22 DIAGNOSIS — L570 Actinic keratosis: Secondary | ICD-10-CM | POA: Diagnosis not present

## 2022-06-22 DIAGNOSIS — L821 Other seborrheic keratosis: Secondary | ICD-10-CM | POA: Diagnosis not present

## 2022-07-28 DIAGNOSIS — Z23 Encounter for immunization: Secondary | ICD-10-CM | POA: Diagnosis not present

## 2022-08-03 ENCOUNTER — Ambulatory Visit (HOSPITAL_BASED_OUTPATIENT_CLINIC_OR_DEPARTMENT_OTHER): Payer: Medicare Other | Admitting: Obstetrics & Gynecology

## 2022-08-07 ENCOUNTER — Encounter: Payer: Self-pay | Admitting: *Deleted

## 2022-08-08 ENCOUNTER — Ambulatory Visit (INDEPENDENT_AMBULATORY_CARE_PROVIDER_SITE_OTHER): Payer: Medicare Other | Admitting: Obstetrics & Gynecology

## 2022-08-08 DIAGNOSIS — Z780 Asymptomatic menopausal state: Secondary | ICD-10-CM | POA: Diagnosis not present

## 2022-08-08 DIAGNOSIS — Z01419 Encounter for gynecological examination (general) (routine) without abnormal findings: Secondary | ICD-10-CM

## 2022-08-08 DIAGNOSIS — Z9189 Other specified personal risk factors, not elsewhere classified: Secondary | ICD-10-CM

## 2022-08-08 DIAGNOSIS — N3281 Overactive bladder: Secondary | ICD-10-CM | POA: Diagnosis not present

## 2022-08-08 NOTE — Progress Notes (Signed)
76 y.o. G2P2 Married White or Caucasian female here for breast and pelvic exam.  I am following her for hypoestrogen state.  Denies vaginal bleeding.  She is adopted and does not know any family hx.  Did see Dr. Wannetta Sender last year due to incontinence.  Did pelvic PT.  This has really helped.  She is using using a light day pad.  Decreasing caffeine and citrus has helped.    Patient's last menstrual period was 11/14/2003.          Sexually active: Yes.    H/O STD:  yes  Health Maintenance: PCP:  Dr. Jonni Sanger.   Has appt scheduled for next year   Vaccines are up to date:  yes Colonoscopy:  03/19/2013 MMG:  04/14/2021 negative.  Scheduled tomorrow.   BMD:  08/13/2019, normal.  Plan to repeat BMD next year. Last pap smear:  10/25/2018 Negative.   H/o abnormal pap smear: No    reports that she has never smoked. She has never used smokeless tobacco. She reports current alcohol use of about 7.0 standard drinks of alcohol per week. She reports that she does not use drugs.  Past Medical History:  Diagnosis Date   Anxiety    Diabetes mellitus without complication ([place])    Headache syndrome 12/18/2016   Heart murmur 03/21/2021   Last echo 2018; calcified aortic valve w/o regurg or stenosis. Nl EF.    High blood pressure    Hypertension    followed by Dr Gillian Shields medications   TIA (transient ischemic attack) 07/2021    Past Surgical History:  Procedure Laterality Date   CRANIOTOMY Bilateral 06/24/2020   Procedure: Bilateral craniotomy for evacuation of subdural hematoma;  Surgeon: Ashok Pall, MD;  Location: [facility];  Service: Neurosurgery;  Laterality: Bilateral;   CRANIOTOMY Right 06/26/2020   Procedure: CRANIOTOMY HEMATOMA EVACUATION SUBDURAL;  Surgeon: Ashok Pall, MD;  Location: [facility];  Service: Neurosurgery;  Laterality: Right;    Current Outpatient Medications  Medication Sig Dispense Refill   atorvastatin (LIPITOR) 10 MG tablet Take 1 tablet (10 mg total) by mouth at  bedtime. 90 tablet 3   clopidogrel (PLAVIX) 75 MG tablet Take 1 tablet (75 mg total) by mouth daily. 90 tablet 3   divalproex (DEPAKOTE ER) 250 MG 24 hr tablet Take 1-3 tablets (250-750 mg total) by mouth See admin instructions. Take 250 mg by mouth in the morning and 750 mg at bedtime 180 tablet 3   losartan (COZAAR) 25 MG tablet Take 1 tablet (25 mg total) by mouth daily. 90 tablet 3   topiramate (TOPAMAX) 50 MG tablet Take 1 tablet (50 mg total) by mouth at bedtime. 90 tablet 3   Vitamin D, Ergocalciferol, (DRISDOL) 1.25 MG (50000 UNIT) CAPS capsule TAKE 1 CAPSULE BY MOUTH ONCE EVERY WEDNESDAY 5 capsule 0   zolpidem (AMBIEN) 10 MG tablet Take 10 mg by mouth at bedtime as needed.     No current facility-administered medications for this visit.    Family History  Adopted: Yes  Problem Relation Age of Onset   Heart attack Maternal Uncle        ? unknown if correct/patient adopted    Review of Systems  Exam:   LMP 11/14/2003      General appearance: alert, cooperative and appears stated age Breasts: normal appearance, no masses or tenderness Abdomen: soft, non-tender; bowel sounds normal; no masses,  no organomegaly Lymph nodes: Cervical, supraclavicular, and axillary nodes normal.  No abnormal inguinal nodes palpated Neurologic:  Grossly normal  Pelvic: External genitalia:  no lesions              Urethra:  normal appearing urethra with no masses, tenderness or lesions              Bartholins and Skenes: normal                 Vagina: normal appearing vagina with atrophic changes and no discharge, no lesions              Cervix: no lesions              Pap taken: No. Bimanual Exam:  Uterus:  normal size, contour, position, consistency, mobility, non-tender              Adnexa: normal adnexa and no mass, fullness, tenderness               Rectovaginal: Confirms               Anus:  normal sphincter tone, no lesions  Chaperone, Octaviano Batty, CMA, was present for  exam.  Assessment/Plan: 1. GYN exam for high-risk Medicare patient - Pap smear guidelines reviewed.   - Mammogram scheduled tomorrow - Colonoscopy 2019 with Dr. Michail Sermon - Bone mineral density 2020 - lab work is done with Dr. Jonni Sanger - vaccines reviewed/updated  2. Postmenopausal - no HRT  3. OAB (overactive bladder) - has seen Dr. Wannetta Sender and done pelvic PT with improvement in symptoms

## 2022-08-09 DIAGNOSIS — Z1231 Encounter for screening mammogram for malignant neoplasm of breast: Secondary | ICD-10-CM | POA: Diagnosis not present

## 2022-08-09 LAB — HM MAMMOGRAPHY

## 2022-08-10 ENCOUNTER — Encounter (HOSPITAL_BASED_OUTPATIENT_CLINIC_OR_DEPARTMENT_OTHER): Payer: Self-pay | Admitting: *Deleted

## 2022-08-11 ENCOUNTER — Encounter (HOSPITAL_BASED_OUTPATIENT_CLINIC_OR_DEPARTMENT_OTHER): Payer: Self-pay | Admitting: Obstetrics & Gynecology

## 2022-08-11 ENCOUNTER — Encounter: Payer: Self-pay | Admitting: Family Medicine

## 2022-08-11 DIAGNOSIS — Z01419 Encounter for gynecological examination (general) (routine) without abnormal findings: Secondary | ICD-10-CM | POA: Insufficient documentation

## 2022-08-13 DIAGNOSIS — Z23 Encounter for immunization: Secondary | ICD-10-CM | POA: Diagnosis not present

## 2022-08-17 ENCOUNTER — Encounter (HOSPITAL_BASED_OUTPATIENT_CLINIC_OR_DEPARTMENT_OTHER): Payer: Self-pay | Admitting: *Deleted

## 2022-11-03 ENCOUNTER — Telehealth: Payer: Self-pay | Admitting: Family Medicine

## 2022-11-03 MED ORDER — TOPIRAMATE 50 MG PO TABS: 50.0000 mg | ORAL_TABLET | Freq: Every day | ORAL | 3 refills | Status: DC

## 2022-11-03 NOTE — Telephone Encounter (Signed)
Patient states:  - She has been taking topiramate which was previously filled by Dr. Margette Fast, now retired  - Wanted to know if PCP thinks she should continue with medication or not  - If so she would like a refill sent to [facility] on [address]  Please advise.

## 2022-12-08 NOTE — Telephone Encounter (Signed)
Noted  

## 2022-12-11 ENCOUNTER — Telehealth: Payer: Self-pay

## 2022-12-11 NOTE — Telephone Encounter (Signed)
Spoke with pt and she stated that her symptoms has not gotten any worse but she will contact the front office to see if she could be seen earlier than march.

## 2022-12-13 ENCOUNTER — Encounter: Payer: Self-pay | Admitting: Family Medicine

## 2022-12-13 ENCOUNTER — Ambulatory Visit (INDEPENDENT_AMBULATORY_CARE_PROVIDER_SITE_OTHER): Payer: Medicare Other | Admitting: Family Medicine

## 2022-12-13 VITALS — BP 138/74 | HR 70 | Temp 98.2°F | Ht 63.0 in | Wt 175.4 lb

## 2022-12-13 DIAGNOSIS — I10 Essential (primary) hypertension: Secondary | ICD-10-CM

## 2022-12-13 DIAGNOSIS — E782 Mixed hyperlipidemia: Secondary | ICD-10-CM

## 2022-12-13 DIAGNOSIS — E119 Type 2 diabetes mellitus without complications: Secondary | ICD-10-CM

## 2022-12-13 DIAGNOSIS — R0789 Other chest pain: Secondary | ICD-10-CM

## 2022-12-13 DIAGNOSIS — E1169 Type 2 diabetes mellitus with other specified complication: Secondary | ICD-10-CM

## 2022-12-13 LAB — CBC WITH DIFFERENTIAL/PLATELET
Basophils Absolute: 0 10*3/uL (ref 0.0–0.1)
Basophils Relative: 0.4 % (ref 0.0–3.0)
Eosinophils Absolute: 0.1 10*3/uL (ref 0.0–0.7)
Eosinophils Relative: 2.5 % (ref 0.0–5.0)
HCT: 42.5 % (ref 36.0–46.0)
Hemoglobin: 14.3 g/dL (ref 12.0–15.0)
Lymphocytes Relative: 30.9 % (ref 12.0–46.0)
Lymphs Abs: 1.3 10*3/uL (ref 0.7–4.0)
MCHC: 33.6 g/dL (ref 30.0–36.0)
MCV: 95.5 fl (ref 78.0–100.0)
Monocytes Absolute: 0.6 10*3/uL (ref 0.1–1.0)
Monocytes Relative: 13.6 % — ABNORMAL HIGH (ref 3.0–12.0)
Neutro Abs: 2.2 10*3/uL (ref 1.4–7.7)
Neutrophils Relative %: 52.6 % (ref 43.0–77.0)
Platelets: 223 10*3/uL (ref 150.0–400.0)
RBC: 4.45 Mil/uL (ref 3.87–5.11)
RDW: 14 % (ref 11.5–15.5)
WBC: 4.3 10*3/uL (ref 4.0–10.5)

## 2022-12-13 LAB — COMPREHENSIVE METABOLIC PANEL
ALT: 11 U/L (ref 0–35)
AST: 14 U/L (ref 0–37)
Albumin: 4.3 g/dL (ref 3.5–5.2)
Alkaline Phosphatase: 94 U/L (ref 39–117)
BUN: 22 mg/dL (ref 6–23)
CO2: 26 mEq/L (ref 19–32)
Calcium: 9 mg/dL (ref 8.4–10.5)
Chloride: 104 mEq/L (ref 96–112)
Creatinine, Ser: 0.88 mg/dL (ref 0.40–1.20)
GFR: 63.55 mL/min (ref >60.00)
Glucose, Bld: 123 mg/dL — ABNORMAL HIGH (ref 70–99)
Potassium: 3.9 mEq/L (ref 3.5–5.1)
Sodium: 141 mEq/L (ref 135–145)
Total Bilirubin: 0.4 mg/dL (ref 0.2–1.2)
Total Protein: 6.6 g/dL (ref 6.0–8.3)

## 2022-12-13 LAB — LIPID PANEL
Cholesterol: 151 mg/dL (ref 0–200)
HDL: 62.8 mg/dL (ref >39.00)
LDL Cholesterol: 65 mg/dL (ref 0–99)
NonHDL: 88.36
Total CHOL/HDL Ratio: 2
Triglycerides: 116 mg/dL (ref 0.0–149.0)
VLDL: 23.2 mg/dL (ref 0.0–40.0)

## 2022-12-13 LAB — HEMOGLOBIN A1C: Hgb A1c MFr Bld: 7 % — ABNORMAL HIGH (ref 4.6–6.5)

## 2022-12-13 LAB — TSH: TSH: 4.31 u[IU]/mL (ref 0.35–5.50)

## 2022-12-13 MED ORDER — LOSARTAN POTASSIUM 50 MG PO TABS: 50.0000 mg | ORAL_TABLET | Freq: Every day | ORAL | 3 refills | Status: DC

## 2022-12-13 NOTE — Patient Instructions (Signed)
Please follow up as scheduled for your next visit with me: 01/12/2023, sooner if chest symptoms persist or worsen any as we discussed.   I will release your lab results to you on your MyChart account with further instructions. You may see the results before I do, but when I review them I will send you a message with my report or have my assistant call you if things need to be discussed. Please reply to my message with any questions. Thank you!   If you have any questions or concerns, please don't hesitate to send me a message via MyChart or call the office at [phone]. Thank you for visiting with [place] today! It's our pleasure caring for you.

## 2022-12-13 NOTE — Progress Notes (Signed)
Subjective  CC:  Chief Complaint  Patient presents with   Chest Pain    Pt stated that she has been having some chest tightness for the past week. Not severe to [facility]    HPI: Amanda Collier is a 77 y.o. female who presents to the office today to address the problems listed above in the chief complaint. 77yo w/ htn, HLD, and DM (last checked a year ago) presents wi/ 1-2 week h/o left sided chest tightness. Started abruptly and has been mild, constant and unchanging.  Not described as pain or heaviness or discomfort. No associated difficulties or changes in breathing, n/v/diaphoresis. No change with rest or activity or position changes. No cough or recent trauma. No heart burn or h/o GERD. No GI complaints. Reviewed echocardiogram (normal) 2022 and EKG's from 2022 and 2021. Pt reports she had a negative cardiac evaluation about 20 years with [facility] cardiology.  No calf pain or leg swelling. H/o CVA on plavix.  DM: denies sxs of hyperglycemia. Diet controlled.  HTN on losartan 25.  HLD on lipitor 10 and due for recheck.   Assessment  1. Chest tightness   2. Essential hypertension   3. Combined hyperlipidemia associated with type 2 diabetes mellitus ([place])   4. Controlled type 2 diabetes mellitus without complication, without long-term current use of insulin ([place])      Plan  Chest tightness,atypical chest discomfort:  unclear etiology but atypical for ischemic heart disease. EKG is reassuring. Will monitor. Rec stress testing if persists. Pt understands instructions to seek emergent medical care if sxs worsen. HTN: bp above goal. Increase losartan to 50 daily. Check renal function and electrolytes. HLD recheck fasting lipids on atorvastatin 10 ZW:CHEN controlled. Recheck A1c. Consider PPI.   I spent a total of 48 minutes for this patient encounter. Time spent included preparation, face-to-face counseling with the patient and coordination of care, review of chart and records,  and documentation of the encounter.  Follow up: next week for cpe  12/25/2022  Orders Placed This Encounter  Procedures   CBC with Differential/Platelet   Comprehensive metabolic panel   Lipid panel   Hemoglobin A1c   TSH   EKG 12-Lead   Meds ordered this encounter  Medications   losartan (COZAAR) 50 MG tablet    Sig: Take 1 tablet (50 mg total) by mouth daily.    Dispense:  90 tablet    Refill:  3      I reviewed the patients updated PMH, FH, and SocHx.    Patient Active Problem List   Diagnosis Date Noted   Controlled type 2 diabetes mellitus without complication, without long-term current use of insulin (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   atorvastatin (LIPITOR) 10 MG tablet Take 1 tablet (10 mg total) by mouth at bedtime.   BELSOMRA 10 MG TABS Take 1 tablet by mouth at bedtime.   clopidogrel (PLAVIX) 75 MG tablet Take 1 tablet (75 mg total) by mouth daily.    divalproex (DEPAKOTE ER) 250 MG 24 hr tablet Take 1-3 tablets (250-750 mg total) by mouth See admin instructions. Take 250 mg by mouth in the morning and 750 mg at bedtime   topiramate (TOPAMAX) 50 MG tablet Take 1 tablet (50 mg total) by mouth at bedtime.   [DISCONTINUED] losartan (COZAAR) 25 MG tablet Take 1 tablet (25 mg total) by mouth daily.    Allergies: Patient is allergic to codeine, lexapro [escitalopram oxalate], lisinopril, and lotensin [benazepril hcl]. Family History: Patient family history includes Heart attack in her maternal uncle. She was adopted. Social History:  Patient  reports that she has never smoked. She has never used smokeless tobacco. She reports current alcohol use of about 7.0 standard drinks of alcohol per week. She reports that she does not use drugs.  Review of Systems: Constitutional: Negative for fever malaise or anorexia Cardiovascular: negative for chest pain Respiratory: negative for SOB or persistent cough Gastrointestinal: negative for abdominal pain  Objective  Vitals: BP 138/74   Pulse 70   Temp 98.2 F (36.8 C)   Ht '5\' 3"'$  (1.6 m)   Wt 175 lb 6.4 oz (79.6 kg)   LMP 11/14/2003   SpO2 95%   BMI 31.07 kg/m  General: no acute distress , A&Ox3 HEENT: PEERL, conjunctiva normal, neck is supple Cardiovascular:  RRR without murmur or gallop.  Respiratory:  Good breath sounds bilaterally, CTAB with normal respiratory effort no chest wall ttp Gastrointestinal: soft, flat abdomen, normal active bowel sounds, no palpable masses, no hepatosplenomegaly, no appreciated hernias No ttp Skin:  Warm, no rashes  Commons side effects, risks, benefits, and alternatives for medications and treatment plan prescribed today were discussed, and the patient expressed understanding of the given instructions. Patient is instructed to call or message via MyChart if he/she has any questions or concerns regarding our treatment plan. No barriers to understanding were  identified. We discussed Red Flag symptoms and signs in detail. Patient expressed understanding regarding what to do in case of urgent or emergency type symptoms.  Medication list was reconciled, printed and provided to the patient in AVS. Patient instructions and summary information was reviewed with the patient as documented in the AVS. This note was prepared with assistance of Dragon voice recognition software. Occasional wrong-word or sound-a-like substitutions may have occurred due to the inherent limitations of voice recognition software

## 2022-12-14 ENCOUNTER — Encounter: Payer: Self-pay | Admitting: Family Medicine

## 2022-12-18 ENCOUNTER — Encounter: Payer: Self-pay | Admitting: Family Medicine

## 2022-12-22 ENCOUNTER — Encounter: Payer: Self-pay | Admitting: Family Medicine

## 2022-12-22 NOTE — Telephone Encounter (Signed)
Patient states: -PCP informed her to follow up with her in 3 months following her fasting labs at 01/31 OV  - She has CPE schedule for March 1st   There is confusion on if she should come back for CPE on 03/01 or just push this back to May which is 3 months from now. Please Advise.

## 2022-12-25 ENCOUNTER — Ambulatory Visit (INDEPENDENT_AMBULATORY_CARE_PROVIDER_SITE_OTHER): Payer: Medicare Other

## 2022-12-25 ENCOUNTER — Other Ambulatory Visit: Payer: Self-pay | Admitting: Family Medicine

## 2022-12-25 VITALS — Wt 175.0 lb

## 2022-12-25 DIAGNOSIS — Z0000 Encounter for general adult medical examination without abnormal findings: Secondary | ICD-10-CM

## 2022-12-25 NOTE — Patient Instructions (Signed)
Amanda Collier , Thank you for taking time to come for your Medicare Wellness Visit. I appreciate your ongoing commitment to your health goals. Please review the following plan we discussed and let me know if I can assist you in the future.   These are the goals we discussed:  Goals       lose weight (pt-stated)      Patient Stated      Lose weight         This is a list of the screening recommended for you and due dates:  Health Maintenance  Topic Date Due   Complete foot exam   12/24/2021   Yearly kidney health urinalysis for diabetes  03/21/2022   COVID-19 Vaccine (5 - 2023-24 season) 12/29/2022*   Eye exam for diabetics  04/08/2023   Hemoglobin A1C  06/13/2023   Mammogram  08/10/2023   Yearly kidney function blood test for diabetes  12/14/2023   Medicare Annual Wellness Visit  12/26/2023   DEXA scan (bone density measurement)  08/12/2024   DTaP/Tdap/Td vaccine (3 - Td or Tdap) 02/22/2027   Pneumonia Vaccine  Completed   Flu Shot  Completed   Hepatitis C Screening: USPSTF Recommendation to screen - Ages 55-79 yo.  Completed   Zoster (Shingles) Vaccine  Completed   HPV Vaccine  Aged Out   Colon Cancer Screening  Discontinued  *Topic was postponed. The date shown is not the original due date.    Advanced directives: copies in chart   Conditions/risks identified: lose weight   Next appointment: Follow up in one year for your annual wellness visit    Preventive Care 65 Years and Older, Female Preventive care refers to lifestyle choices and visits with your health care provider that can promote health and wellness. What does preventive care include? A yearly physical exam. This is also called an annual well check. Dental exams once or twice a year. Routine eye exams. Ask your health care provider how often you should have your eyes checked. Personal lifestyle choices, including: Daily care of your teeth and gums. Regular physical activity. Eating a healthy  diet. Avoiding tobacco and drug use. Limiting alcohol use. Practicing safe sex. Taking low-dose aspirin every day. Taking vitamin and mineral supplements as recommended by your health care provider. What happens during an annual well check? The services and screenings done by your health care provider during your annual well check will depend on your age, overall health, lifestyle risk factors, and family history of disease. Counseling  Your health care provider may ask you questions about your: Alcohol use. Tobacco use. Drug use. Emotional well-being. Home and relationship well-being. Sexual activity. Eating habits. History of falls. Memory and ability to understand (cognition). Work and work Statistician. Reproductive health. Screening  You may have the following tests or measurements: Height, weight, and BMI. Blood pressure. Lipid and cholesterol levels. These may be checked every 5 years, or more frequently if you are over 20 years old. Skin check. Lung cancer screening. You may have this screening every year starting at age 81 if you have a 30-pack-year history of smoking and currently smoke or have quit within the past 15 years. Fecal occult blood test (FOBT) of the stool. You may have this test every year starting at age 61. Flexible sigmoidoscopy or colonoscopy. You may have a sigmoidoscopy every 5 years or a colonoscopy every 10 years starting at age 60. Hepatitis C blood test. Hepatitis B blood test. Sexually transmitted disease (STD) testing. Diabetes  screening. This is done by checking your blood sugar (glucose) after you have not eaten for a while (fasting). You may have this done every 1-3 years. Bone density scan. This is done to screen for osteoporosis. You may have this done starting at age 12. Mammogram. This may be done every 1-2 years. Talk to your health care provider about how often you should have regular mammograms. Talk with your health care provider about  your test results, treatment options, and if necessary, the need for more tests. Vaccines  Your health care provider may recommend certain vaccines, such as: Influenza vaccine. This is recommended every year. Tetanus, diphtheria, and acellular pertussis (Tdap, Td) vaccine. You may need a Td booster every 10 years. Zoster vaccine. You may need this after age 29. Pneumococcal 13-valent conjugate (PCV13) vaccine. One dose is recommended after age 27. Pneumococcal polysaccharide (PPSV23) vaccine. One dose is recommended after age 62. Talk to your health care provider about which screenings and vaccines you need and how often you need them. This information is not intended to replace advice given to you by your health care provider. Make sure you discuss any questions you have with your health care provider. Document Released: 11/26/2015 Document Revised: 07/19/2016 Document Reviewed: 08/31/2015 Elsevier Interactive Patient Education  2017 [place] Prevention in the Home Falls can cause injuries. They can happen to people of all ages. There are many things you can do to make your home safe and to help prevent falls. What can I do on the outside of my home? Regularly fix the edges of walkways and driveways and fix any cracks. Remove anything that might make you trip as you walk through a door, such as a raised step or threshold. Trim any bushes or trees on the path to your home. Use bright outdoor lighting. Clear any walking paths of anything that might make someone trip, such as rocks or tools. Regularly check to see if handrails are loose or broken. Make sure that both sides of any steps have handrails. Any raised decks and porches should have guardrails on the edges. Have any leaves, snow, or ice cleared regularly. Use sand or salt on walking paths during winter. Clean up any spills in your garage right away. This includes oil or grease spills. What can I do in the bathroom? Use  night lights. Install grab bars by the toilet and in the tub and shower. Do not use towel bars as grab bars. Use non-skid mats or decals in the tub or shower. If you need to sit down in the shower, use a plastic, non-slip stool. Keep the floor dry. Clean up any water that spills on the floor as soon as it happens. Remove soap buildup in the tub or shower regularly. Attach bath mats securely with double-sided non-slip rug tape. Do not have throw rugs and other things on the floor that can make you trip. What can I do in the bedroom? Use night lights. Make sure that you have a light by your bed that is easy to reach. Do not use any sheets or blankets that are too big for your bed. They should not hang down onto the floor. Have a firm chair that has side arms. You can use this for support while you get dressed. Do not have throw rugs and other things on the floor that can make you trip. What can I do in the kitchen? Clean up any spills right away. Avoid walking on wet floors. Keep  items that you use a lot in easy-to-reach places. If you need to reach something above you, use a strong step stool that has a grab bar. Keep electrical cords out of the way. Do not use floor polish or wax that makes floors slippery. If you must use wax, use non-skid floor wax. Do not have throw rugs and other things on the floor that can make you trip. What can I do with my stairs? Do not leave any items on the stairs. Make sure that there are handrails on both sides of the stairs and use them. Fix handrails that are broken or loose. Make sure that handrails are as long as the stairways. Check any carpeting to make sure that it is firmly attached to the stairs. Fix any carpet that is loose or worn. Avoid having throw rugs at the top or bottom of the stairs. If you do have throw rugs, attach them to the floor with carpet tape. Make sure that you have a light switch at the top of the stairs and the bottom of the  stairs. If you do not have them, ask someone to add them for you. What else can I do to help prevent falls? Wear shoes that: Do not have high heels. Have rubber bottoms. Are comfortable and fit you well. Are closed at the toe. Do not wear sandals. If you use a stepladder: Make sure that it is fully opened. Do not climb a closed stepladder. Make sure that both sides of the stepladder are locked into place. Ask someone to hold it for you, if possible. Clearly mark and make sure that you can see: Any grab bars or handrails. First and last steps. Where the edge of each step is. Use tools that help you move around (mobility aids) if they are needed. These include: Canes. Walkers. Scooters. Crutches. Turn on the lights when you go into a dark area. Replace any light bulbs as soon as they burn out. Set up your furniture so you have a clear path. Avoid moving your furniture around. If any of your floors are uneven, fix them. If there are any pets around you, be aware of where they are. Review your medicines with your doctor. Some medicines can make you feel dizzy. This can increase your chance of falling. Ask your doctor what other things that you can do to help prevent falls. This information is not intended to replace advice given to you by your health care provider. Make sure you discuss any questions you have with your health care provider. Document Released: 08/26/2009 Document Revised: 04/06/2016 Document Reviewed: 12/04/2014 Elsevier Interactive Patient Education  2017 [employer].

## 2022-12-25 NOTE — Progress Notes (Signed)
I connected with  Eliezer Lofts on 12/25/22 by a audio enabled telemedicine application and verified that I am speaking with the correct person using two identifiers.  Patient Location: Home  Provider Location: Office/Clinic  I discussed the limitations of evaluation and management by telemedicine. The patient expressed understanding and agreed to proceed.   Subjective:   Amanda Collier is a 77 y.o. female who presents for Medicare Annual (Subsequent) preventive examination.  Review of Systems     Cardiac Risk Factors include: advanced age (>45mn, >>10women);dyslipidemia;hypertension;diabetes mellitus;obesity (BMI >30kg/m2)     Objective:    Today's Vitals   12/25/22 1130  Weight: 175 lb (79.4 kg)   Body mass index is 31 kg/m.     12/25/2022   11:38 AM 12/13/2021   11:44 AM 12/12/2021   12:03 PM 10/31/2021   11:53 AM 08/04/2021   12:01 PM 07/28/2021    2:07 PM 07/08/2020    6:11 PM  Advanced Directives  Does Patient Have a Medical Advance Directive? Yes Yes Yes Yes Yes No Yes  Type of AParamedicof [place] will [place] will Healthcare Power of [place] will  Living will  Does patient want to make changes to medical advance directive? No - Patient declined    No - Patient declined  No - Patient declined  Copy of [place] Chart? Yes - validated most recent copy scanned in chart (See row information)  Yes - validated most recent copy scanned in chart (See row information)  No - copy requested  No - copy requested  Would patient like information on creating a medical advance directive?     No - Patient declined No - Patient declined     Current Medications (verified) Outpatient Encounter Medications as of 12/25/2022  Medication Sig   Acetaminophen (TYLENOL ARTHRITIS PAIN PO) Take 600 mg by mouth in the morning and at bedtime.   atorvastatin (LIPITOR) 10  MG tablet Take 1 tablet (10 mg total) by mouth at bedtime.   BELSOMRA 10 MG TABS Take 1 tablet by mouth at bedtime.   clopidogrel (PLAVIX) 75 MG tablet Take 1 tablet (75 mg total) by mouth daily.   divalproex (DEPAKOTE ER) 250 MG 24 hr tablet Take 1-3 tablets (250-750 mg total) by mouth See admin instructions. Take 250 mg by mouth in the morning and 750 mg at bedtime   losartan (COZAAR) 50 MG tablet Take 1 tablet (50 mg total) by mouth daily.   topiramate (TOPAMAX) 50 MG tablet Take 1 tablet (50 mg total) by mouth at bedtime.   No facility-administered encounter medications on file as of 12/25/2022.    Allergies (verified) Codeine, Lexapro [escitalopram oxalate], Lisinopril, and Lotensin [benazepril hcl]   History: Past Medical History:  Diagnosis Date   Anxiety    Diabetes mellitus without complication (HCarlyss    Headache syndrome 12/18/2016   Heart murmur 03/21/2021   Last echo 2018; calcified aortic valve w/o regurg or stenosis. Nl EF.    High blood pressure    Hypertension    followed by Dr DGillian Shieldsmedications   TIA (transient ischemic attack) 07/2021   Past Surgical History:  Procedure Laterality Date   CRANIOTOMY Bilateral 06/24/2020   Procedure: Bilateral craniotomy for evacuation of subdural hematoma;  Surgeon: CAshok Pall MD;  [facility]  Service: Neurosurgery;  Laterality: Bilateral;   CRANIOTOMY Right 06/26/2020   Procedure: CRANIOTOMY HEMATOMA EVACUATION SUBDURAL;  Surgeon: Ashok Pall, MD;  Location: [facility];  Service: Neurosurgery;  Laterality: Right;   Family History  Adopted: Yes  Problem Relation Age of Onset   Heart attack Maternal Uncle        ? unknown if correct/patient adopted   Social History   Socioeconomic History   Marital status: Married    Spouse name: Jerniya Smutny   Number of children: 2   Years of education: Teacher, English as a foreign language, almost masters   Highest education level: Not on file  Occupational History   Not on file  Tobacco  Use   Smoking status: Never   Smokeless tobacco: Never  Vaping Use   Vaping Use: Never used  Substance and Sexual Activity   Alcohol use: Yes    Alcohol/week: 7.0 standard drinks of alcohol    Types: 7 Standard drinks or equivalent per week   Drug use: No   Sexual activity: Yes    Partners: Male    Birth control/protection: Post-menopausal    Comment: vasectomy  Other Topics Concern   Not on file  Social History Narrative   Not on file   Social Determinants of Health   Financial Resource Strain: Low Risk  (12/25/2022)   Overall Financial Resource Strain (CARDIA)    Difficulty of Paying Living Expenses: Not hard at all  Food Insecurity: No Food Insecurity (12/25/2022)   Hunger Vital Sign    Worried About Running Out of Food in the Last Year: Never true    Ran Out of Food in the Last Year: Never true  Transportation Needs: No Transportation Needs (12/25/2022)   PRAPARE - Hydrologist (Medical): No    Lack of Transportation (Non-Medical): No  Physical Activity: Inactive (12/25/2022)   Exercise Vital Sign    Days of Exercise per Week: 0 days    Minutes of Exercise per Session: 0 min  Stress: No Stress Concern Present (12/25/2022)   [place]    Feeling of Stress : Only a little  Social Connections: Socially Integrated (12/25/2022)   Social Connection and Isolation Panel [NHANES]    Frequency of Communication with Friends and Family: More than three times a week    Frequency of Social Gatherings with Friends and Family: More than three times a week    Attends Religious Services: More than 4 times per year    Active Member of [employer] or Organizations: Yes    Attends Archivist Meetings: 1 to 4 times per year    Marital Status: Married    Tobacco Counseling Counseling given: Not Answered   Clinical Intake:  Pre-visit preparation completed: Yes  Pain : No/denies pain      BMI - recorded: 31 Nutritional Status: BMI > 30  Obese Nutritional Risks: None Diabetes: Yes CBG done?: No Did pt. bring in CBG monitor from home?: No  How often do you need to have someone help you when you read instructions, pamphlets, or other written materials from your doctor or pharmacy?: 1 - Never  Diabetic?Nutrition Risk Assessment:  Has the patient had any N/V/D within the last 2 months?  No  Does the patient have any non-healing wounds?  No  Has the patient had any unintentional weight loss or weight gain?  No   Diabetes:  Is the patient diabetic?  Yes  If diabetic, was a CBG obtained today?  No  Did the patient bring in their glucometer from home?  No  How often do you monitor your CBG's? N/a.   Financial Strains and Diabetes Management:  Are you having any financial strains with the device, your supplies or your medication? No .  Does the patient want to be seen by Chronic Care Management for management of their diabetes?  No  Would the patient like to be referred to a Nutritionist or for Diabetic Management?  No   Diabetic Exams:  Diabetic Eye Exam: Completed 04/07/22 Diabetic Foot Exam: Overdue, Pt has been advised about the importance in completing this exam. Pt is scheduled for diabetic foot exam on next appt .   Interpreter Needed?: No  Information entered by :: Charlott Rakes, LPN   Activities of Daily Living    12/25/2022   11:39 AM  In your present state of health, do you have any difficulty performing the following activities:  Hearing? 0  Vision? 0  Difficulty concentrating or making decisions? 0  Walking or climbing stairs? 0  Dressing or bathing? 0  Doing errands, shopping? 0  Preparing Food and eating ? N  Using the Toilet? N  In the past six months, have you accidently leaked urine? Y  Comment wears a panty liner  Do you have problems with loss of bowel control? N  Managing your Medications? N  Managing your Finances? N   Housekeeping or managing your Housekeeping? N    Patient Care Team: Leamon Arnt, MD as PCP - General (Family Medicine) Chucky May, MD as Consulting Physician (Psychiatry) Ashok Pall, MD as Consulting Physician (Neurosurgery) Marcial Pacas, MD as Consulting Physician (Neurology) Meredith Staggers, MD as Consulting Physician (Physical Medicine and Rehabilitation)  Indicate any recent Medical Services you may have received from other than [facility] providers in the past year (date may be approximate).     Assessment:   This is a routine wellness examination for [place].  Hearing/Vision screen Hearing Screening - Comments:: Pt denies any hearing issue  Vision Screening - Comments:: Pt [facility] for annual eye exams   Dietary issues and exercise activities discussed: Current Exercise Habits: The patient does not participate in regular exercise at present   Goals Addressed               This Visit's Progress     lose weight (pt-stated)         Depression Screen    12/25/2022   11:34 AM 12/13/2022    9:44 AM 08/08/2022    2:14 PM 12/13/2021   11:43 AM 12/12/2021   11:58 AM 02/14/2021    8:38 AM 09/29/2020    9:32 AM  PHQ 2/9 Scores  PHQ - 2 Score 0 0 0 0 0 1 0  PHQ- 9 Score 0   0       Fall Risk    12/25/2022   11:39 AM 12/13/2022    9:43 AM 01/26/2022    9:37 AM 12/13/2021   11:43 AM 12/12/2021   12:04 PM  Fall Risk   Falls in the past year? 0 1 1 0 0  Number falls in past yr: 0 0 0 0 0  Injury with Fall? 0 1 1 0 0  Risk for fall due to : Impaired vision History of fall(s) History of fall(s);Impaired mobility No Fall Risks Impaired vision;Impaired balance/gait  Follow up Falls prevention discussed Falls evaluation completed Falls evaluation completed Falls evaluation completed Falls prevention discussed    FALL RISK PREVENTION PERTAINING TO THE HOME:  Any stairs in  or around the home? Yes  If so, are there any without handrails? No  Home  free of loose throw rugs in walkways, pet beds, electrical cords, etc? Yes  Adequate lighting in your home to reduce risk of falls? Yes   ASSISTIVE DEVICES UTILIZED TO PREVENT FALLS:  Life alert? No  Use of a cane, walker or w/c? Yes  Grab bars in the bathroom? Yes  Shower chair or bench in shower? No  Elevated toilet seat or a handicapped toilet? No   TIMED UP AND GO:  Was the test performed? No .   Cognitive Function:        12/25/2022   11:40 AM 12/12/2021   12:05 PM  6CIT Screen  What Year? 0 points 0 points  What month? 0 points 0 points  What time? 0 points 0 points  Count back from 20 0 points 2 points  Months in reverse 0 points 0 points  Repeat phrase 0 points 0 points  Total Score 0 points 2 points    Immunizations Immunization History  Administered Date(s) Administered   Fluad Quad(high Dose 65+) 08/08/2021, 07/28/2022   Influenza Split 09/05/2006, 09/03/2008, 08/24/2009, 08/08/2010, 08/30/2012, 09/06/2017, 09/16/2018, 07/26/2022   Influenza, High Dose Seasonal PF 08/30/2012   Influenza,inj,Quad PF,6+ Mos 07/30/2019   Influenza,inj,quad, With Preservative 08/18/2014, 09/24/2015   PFIZER(Purple Top)SARS-COV-2 Vaccination 12/19/2019, 01/13/2020, 09/23/2020   Pfizer Covid-19 Vaccine Bivalent Booster 77yr & up 09/25/2021   Pneumococcal Conjugate-13 04/13/2014   Pneumococcal Polysaccharide-23 10/18/2011   Respiratory Syncytial Virus Vaccine,Recomb Aduvanted(Arexvy) 07/26/2022   Tdap 01/30/2007, 02/21/2017   Zoster Recombinat (Shingrix) 03/25/2020, 11/08/2020   Zoster, Live 09/28/2008, 03/25/2020, 11/08/2020    TDAP status: Up to date  Flu Vaccine status: Up to date  Pneumococcal vaccine status: Up to date  Covid-19 vaccine status: Completed vaccines  Qualifies for Shingles Vaccine? Yes   Zostavax completed Yes   Shingrix Completed?: Yes  Screening Tests Health Maintenance  Topic Date Due   FOOT EXAM  12/24/2021   Diabetic kidney evaluation -  Urine ACR  03/21/2022   COVID-19 Vaccine (5 - 2023-24 season) 12/29/2022 (Originally 07/14/2022)   OPHTHALMOLOGY EXAM  04/08/2023   HEMOGLOBIN A1C  06/13/2023   MAMMOGRAM  08/10/2023   Diabetic kidney evaluation - eGFR measurement  12/14/2023   Medicare Annual Wellness (AWV)  12/26/2023   DEXA SCAN  08/12/2024   DTaP/Tdap/Td (3 - Td or Tdap) 02/22/2027   Pneumonia Vaccine 77 Years old  Completed   INFLUENZA VACCINE  Completed   Hepatitis C Screening  Completed   Zoster Vaccines- Shingrix  Completed   HPV VACCINES  Aged Out   COLONOSCOPY (Pts 45-465yrInsurance coverage will need to be confirmed)  Discontinued    Health Maintenance  Health Maintenance Due  Topic Date Due   FOOT EXAM  12/24/2021   Diabetic kidney evaluation - Urine ACR  03/21/2022    Colorectal cancer screening: No longer required.   Mammogram status: Completed 08/09/22. Repeat every year  Bone Density status: Completed 08/13/19. Results reflect: Bone density results: NORMAL. Repeat every 5 years.   Additional Screening:  Hepatitis C Screening:  Completed 03/21/21  Vision Screening: Recommended annual ophthalmology exams for early detection of glaucoma and other disorders of the eye. Is the patient up to date with their annual eye exam?  Yes  Who is the provider or what is the name of the office in which the patient attends annual eye exams? [facility]  If pt is not established with a  provider, would they like to be referred to a provider to establish care? No .   Dental Screening: Recommended annual dental exams for proper oral hygiene  Community Resource Referral / Chronic Care Management: CRR required this visit?  No   CCM required this visit?  No      Plan:     I have personally reviewed and noted the following in the patient's chart:   Medical and social history Use of alcohol, tobacco or illicit drugs  Current medications and supplements including opioid prescriptions. Patient is not  currently taking opioid prescriptions. Functional ability and status Nutritional status Physical activity Advanced directives List of other physicians Hospitalizations, surgeries, and ER visits in previous 12 months Vitals Screenings to include cognitive, depression, and falls Referrals and appointments  In addition, I have reviewed and discussed with patient certain preventive protocols, quality metrics, and best practice recommendations. A written personalized care plan for preventive services as well as general preventive health recommendations were provided to patient.     Willette Brace, LPN   [id number]   Nurse Notes: none

## 2023-01-09 ENCOUNTER — Other Ambulatory Visit: Payer: Self-pay | Admitting: Family Medicine

## 2023-01-12 ENCOUNTER — Ambulatory Visit: Payer: Medicare Other | Admitting: Family Medicine

## 2023-02-19 ENCOUNTER — Ambulatory Visit: Payer: Medicare Other | Admitting: Family Medicine

## 2023-02-22 ENCOUNTER — Ambulatory Visit (INDEPENDENT_AMBULATORY_CARE_PROVIDER_SITE_OTHER): Payer: Medicare Other | Admitting: Family Medicine

## 2023-02-22 ENCOUNTER — Telehealth: Payer: Self-pay | Admitting: Family Medicine

## 2023-02-22 VITALS — BP 126/70 | HR 69 | Temp 98.3°F | Ht 63.0 in | Wt 172.4 lb

## 2023-02-22 DIAGNOSIS — Z8679 Personal history of other diseases of the circulatory system: Secondary | ICD-10-CM | POA: Diagnosis not present

## 2023-02-22 DIAGNOSIS — I10 Essential (primary) hypertension: Secondary | ICD-10-CM | POA: Diagnosis not present

## 2023-02-22 DIAGNOSIS — E1169 Type 2 diabetes mellitus with other specified complication: Secondary | ICD-10-CM | POA: Diagnosis not present

## 2023-02-22 DIAGNOSIS — F3110 Bipolar disorder, current episode manic without psychotic features, unspecified: Secondary | ICD-10-CM

## 2023-02-22 DIAGNOSIS — R2681 Unsteadiness on feet: Secondary | ICD-10-CM | POA: Diagnosis not present

## 2023-02-22 DIAGNOSIS — B351 Tinea unguium: Secondary | ICD-10-CM

## 2023-02-22 DIAGNOSIS — E782 Mixed hyperlipidemia: Secondary | ICD-10-CM | POA: Diagnosis not present

## 2023-02-22 DIAGNOSIS — Z9889 Other specified postprocedural states: Secondary | ICD-10-CM | POA: Diagnosis not present

## 2023-02-22 DIAGNOSIS — E119 Type 2 diabetes mellitus without complications: Secondary | ICD-10-CM

## 2023-02-22 DIAGNOSIS — G4489 Other headache syndrome: Secondary | ICD-10-CM

## 2023-02-22 LAB — POCT GLYCOSYLATED HEMOGLOBIN (HGB A1C): Hemoglobin A1C: 6.3 % — AB (ref 4.0–5.6)

## 2023-02-22 LAB — MICROALBUMIN / CREATININE URINE RATIO
Creatinine,U: 113.6 mg/dL
Microalb Creat Ratio: 0.6 mg/g (ref 0.0–30.0)
Microalb, Ur: <0.7 mg/dL (ref 0.0–1.9)

## 2023-02-22 NOTE — Telephone Encounter (Signed)
Patient called stating she saw on her AVS that pcp stated "We will call you with an appointment to a podiatrist."   Patient states she would like to make appointment herself due to her unpredictable schedule.

## 2023-02-22 NOTE — Patient Instructions (Signed)
Please return in 6 months to recheck diabetes and blood pressure.  Please set up an appointment for a diabetic eye exam and have the results sent to me.  We will call you with an appointment to a podiatrist.  If you have any questions or concerns, please don't hesitate to send me a message via MyChart or call the office at [phone]. Thank you for visiting with Amanda Collier today! It's our pleasure caring for you.

## 2023-02-22 NOTE — Progress Notes (Signed)
Subjective  CC:  Chief Complaint  Patient presents with   Annual Exam    Pt here for Annual Exam and is currently fasting    Diabetes    HPI: Amanda Collier is a 77 y.o. female who presents to the office today for follow up of diabetes and problems listed above in the chief complaint.  Diabetes follow up: Her diabetic control is reported as Improved. She has improved her diet, drinking less wine and lost a few pounds.  She denies exertional CP or SOB or symptomatic hypoglycemia. She denies foot sores or paresthesias.  Grief: her sister passed away 2 weeks ago: alzheimers. Appropriately grieving Mood disorder: admits has been a hard year; coping well. Will see Dr. Evelene Croon for medication review. No manic episodes H/o subdural hematoma/craniotomy: on topamax for headache management for years since surgery. Inquires if still needs it. No longer with headaches. Denies side effects. Unsteady gait: would like to start going to [place]; no falls. Uses cane to ambulate C/o fungal toenails;hard to cut.  HTN: improved on higher dose of lisinopril w/o side effects. No more atypical chest pain (see last note). Believes likely stress induced (source, gi).  Reviewed labs from 11/2022 Office Visit on 02/22/2023  Component Date Value Ref Range Status   Hemoglobin A1C 02/22/2023 6.3 (A)  4.0 - 5.6 % Final   Lab Results  Component Value Date   CHOL 151 12/13/2022   HDL 62.80 12/13/2022   LDLCALC 65 12/13/2022   TRIG 116.0 12/13/2022   CHOLHDL 2 12/13/2022   Lab Results  Component Value Date   CREATININE 0.88 12/13/2022   BUN 22 12/13/2022   NA 141 12/13/2022   K 3.9 12/13/2022   CL 104 12/13/2022   CO2 26 12/13/2022   Lab Results  Component Value Date   TSH 4.31 12/13/2022   Lab Results  Component Value Date   WBC 4.3 12/13/2022   HGB 14.3 12/13/2022   HCT 42.5 12/13/2022   MCV 95.5 12/13/2022   PLT 223.0 12/13/2022    Wt Readings from Last 3 Encounters:  02/22/23 172 lb 6.4 oz  (78.2 kg)  12/25/22 175 lb (79.4 kg)  12/13/22 175 lb 6.4 oz (79.6 kg)    BP Readings from Last 3 Encounters:  02/22/23 126/70  12/13/22 138/74  04/04/22 (!) 142/80    Assessment  1. Controlled type 2 diabetes mellitus without complication, without long-term current use of insulin   2. Essential hypertension   3. Combined hyperlipidemia associated with type 2 diabetes mellitus   4. Manic bipolar I disorder   5. Onychomycosis   6. History of subdural hematoma   7. Status post craniotomy   8. Unsteady gait   9. Headache syndrome      Plan  Diabetes is currently very well controlled. Continue diet. Check urine nephropathy screen today. Refer to podiatry for diabetic nail/foot care. Pt to schedule eye exam. HTN: controlled on ace.  HLD at goal on statin Mood d/o: controlled. Discussed topamax sometimes used for mood as well. Will discuss with psych.  Headaches syndrome: stable on topamax; can consider trial wean if psych agrees.  Fall prevention; rec gym/strength training.   Follow up: 6 mo for htn and dm. Orders Placed This Encounter  Procedures   Microalbumin / creatinine urine ratio   Ambulatory referral to Podiatry   POCT HgB A1C   No orders of the defined types were placed in this encounter.     Immunization History  Administered  Date(s) Administered   Fluad Quad(high Dose 65+) 08/08/2021, 07/28/2022   Influenza Split 09/05/2006, 09/03/2008, 08/24/2009, 08/08/2010, 08/30/2012, 09/06/2017, 09/16/2018, 07/26/2022   Influenza, High Dose Seasonal PF 08/30/2012   Influenza,inj,Quad PF,6+ Mos 07/30/2019   Influenza,inj,quad, With Preservative 08/18/2014, 09/24/2015   PFIZER(Purple Top)SARS-COV-2 Vaccination 12/19/2019, 01/13/2020, 09/23/2020   Pfizer Covid-19 Vaccine Bivalent Booster 436yrs & up 09/25/2021   Pneumococcal Conjugate-13 04/13/2014   Pneumococcal Polysaccharide-23 10/18/2011   Respiratory Syncytial Virus Vaccine,Recomb Aduvanted(Arexvy) 07/26/2022   Tdap  01/30/2007, 02/21/2017   Zoster Recombinat (Shingrix) 03/25/2020, 11/08/2020   Zoster, Live 09/28/2008, 03/25/2020, 11/08/2020    Diabetes Related Lab Review: Lab Results  Component Value Date   HGBA1C 6.3 (A) 02/22/2023   HGBA1C 7.0 (H) 12/13/2022   HGBA1C 5.9 (A) 12/13/2021    Lab Results  Component Value Date   MICROALBUR <0.7 03/21/2021   Lab Results  Component Value Date   CREATININE 0.88 12/13/2022   BUN 22 12/13/2022   NA 141 12/13/2022   K 3.9 12/13/2022   CL 104 12/13/2022   CO2 26 12/13/2022   Lab Results  Component Value Date   CHOL 151 12/13/2022   CHOL 141 07/29/2021   CHOL 156 03/21/2021   Lab Results  Component Value Date   HDL 62.80 12/13/2022   HDL 57 07/29/2021   HDL 66.50 03/21/2021   Lab Results  Component Value Date   LDLCALC 65 12/13/2022   LDLCALC 62 07/29/2021   LDLCALC 64 03/21/2021   Lab Results  Component Value Date   TRIG 116.0 12/13/2022   TRIG 108 07/29/2021   TRIG 127.0 03/21/2021   Lab Results  Component Value Date   CHOLHDL 2 12/13/2022   CHOLHDL 2.5 07/29/2021   CHOLHDL 2 03/21/2021   No results found for: "LDLDIRECT" The 10-year ASCVD risk score (Arnett DK, et al., 2019) is: 43%   Values used to calculate the score:     Age: [age] years     Sex: Female     Is Non-Hispanic African American: No     Diabetic: Yes     Tobacco smoker: No     Systolic Blood Pressure: 126 mmHg     Is BP treated: Yes     HDL Cholesterol: 62.8 mg/dL     Total Cholesterol: 151 mg/dL I have reviewed the PMH, Fam and Soc history. Patient Active Problem List   Diagnosis Date Noted   Controlled type 2 diabetes mellitus without complication, without long-term current use of insulin 12/13/2021    Priority: High   History of subdural hematoma 12/13/2021    Priority: High   Hx of traumatic brain injury 03/09/2021    Priority: High   Combined hyperlipidemia associated with type 2 diabetes mellitus 12/26/2020    Priority: High   Manic bipolar I  disorder 12/24/2020    Priority: High   Essential hypertension 12/17/2015    Priority: High   Personal history of colonic polyps 12/24/2020    Priority: Medium    Neurogenic bladder     Priority: Medium    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 Nl EF.     Right-sided sensorineural hearing loss 09/29/2020    Priority: Low   Status post craniotomy 06/24/2020    Priority: Low    Social History: Patient  reports that she has never smoked. She has never used smokeless tobacco. She reports current alcohol use of about 7.0 standard drinks of alcohol per week. She reports that she does not use drugs.  Review of Systems: Ophthalmic: negative for eye pain, loss of vision or double vision Cardiovascular: negative for chest pain Respiratory: negative for SOB or persistent cough Gastrointestinal: negative for abdominal pain Genitourinary: negative for dysuria or gross hematuria MSK: negative for foot lesions Neurologic: negative for weakness or gait disturbance  Objective  Vitals: BP 126/70   Pulse 69   Temp 98.3 F (36.8 C)   Ht 5\' 3"  (1.6 m)   Wt 172 lb 6.4 oz (78.2 kg)   LMP 11/14/2003   SpO2 97%   BMI 30.54 kg/m  General: well appearing, no acute distress  Psych:  Alert and oriented, normal mood and affect HEENT:  Normocephalic, atraumatic, moist mucous membranes, supple neck  Cardiovascular:  Nl S1 and S2, RRR without murmur, gallop or rub. no edema Respiratory:  Good breath sounds bilaterally, CTAB with normal effort, no rales Gastrointestinal: normal BS, soft, nontender Skin:  Warm, no rashes Neurologic:   Mental status is normal. normal gait Foot  exam: no erythema, pallor, or cyanosis visible nl proprioception and sensation to monofilament testing bilaterally, +2 distal pulses bilaterally, fungal thickened toenails    Diabetic education: ongoing education regarding chronic disease management for diabetes was given today. We continue to reinforce the ABC's of diabetic management: A1c (<7 or 8 dependent upon patient), tight blood pressure control, and cholesterol management with goal LDL < 100 minimally. We discuss diet strategies, exercise recommendations, medication options and possible side effects. At each visit, we review recommended immunizations and preventive care recommendations for diabetics and stress that good diabetic control can prevent other problems. See below for this patient's data.   Commons side effects, risks, benefits, and alternatives for medications and treatment plan prescribed today were discussed, and the patient expressed understanding of the given instructions. Patient is instructed to call or message via MyChart if he/she has any questions or concerns regarding our treatment plan. No barriers to understanding were identified. We discussed Red Flag symptoms and signs in detail. Patient expressed understanding regarding what to do in case of urgent or emergency type symptoms.  Medication list was reconciled, printed and provided to the patient in AVS. Patient instructions and summary information was reviewed with the patient as documented in the AVS. This note was prepared with assistance of Dragon voice recognition software. Occasional wrong-word or sound-a-like substitutions may have occurred due to the inherent limitations of voice recognition software

## 2023-03-15 ENCOUNTER — Encounter: Payer: Self-pay | Admitting: Podiatry

## 2023-03-15 ENCOUNTER — Ambulatory Visit (INDEPENDENT_AMBULATORY_CARE_PROVIDER_SITE_OTHER): Payer: Medicare Other | Admitting: Podiatry

## 2023-03-15 DIAGNOSIS — M79675 Pain in left toe(s): Secondary | ICD-10-CM | POA: Diagnosis not present

## 2023-03-15 DIAGNOSIS — B351 Tinea unguium: Secondary | ICD-10-CM

## 2023-03-15 DIAGNOSIS — M79674 Pain in right toe(s): Secondary | ICD-10-CM

## 2023-03-15 NOTE — Progress Notes (Signed)
Subjective:   Patient ID: Amanda Collier, female   DOB: 77 y.o.   MRN: [id number]   HPI Patient presents concerned about toenail stated they have been somewhat thickened and that they can get tender at times and she just wants to see if there is anything else that we can do and that what other treatments might be available.  Patient does not smoke tries to be active   Review of Systems  All other systems reviewed and are negative.       Objective:  Physical Exam Vitals and nursing note reviewed.  Constitutional:      Appearance: She is well-developed.  Pulmonary:     Effort: Pulmonary effort is normal.  Musculoskeletal:        General: Normal range of motion.  Skin:    General: Skin is warm.  Neurological:     Mental Status: She is alert.     Neurovascular status found to be intact muscle strength adequate range of motion adequate patient's nails are discolored there is some thickness of the beds that gets sore in the corners at times and she does overall do well with pedicures but wanted to make sure that was appropriate.  Good digital perfusion well-oriented x 3     Assessment:  Acute mycotic nail infection 1-5 both feet that has had some tenderness     Plan:  H&P education rendered do not recommend oral treatment due to other meds she is on debrided nailbeds 1-5 both feet no angiogenic bleeding and can have pedicures

## 2023-03-30 DIAGNOSIS — H524 Presbyopia: Secondary | ICD-10-CM | POA: Diagnosis not present

## 2023-03-30 DIAGNOSIS — H53143 Visual discomfort, bilateral: Secondary | ICD-10-CM | POA: Diagnosis not present

## 2023-03-30 DIAGNOSIS — H5203 Hypermetropia, bilateral: Secondary | ICD-10-CM | POA: Diagnosis not present

## 2023-03-30 DIAGNOSIS — H52223 Regular astigmatism, bilateral: Secondary | ICD-10-CM | POA: Diagnosis not present

## 2023-03-30 DIAGNOSIS — H2513 Age-related nuclear cataract, bilateral: Secondary | ICD-10-CM | POA: Diagnosis not present

## 2023-03-30 LAB — HM DIABETES EYE EXAM

## 2023-04-05 DIAGNOSIS — Z23 Encounter for immunization: Secondary | ICD-10-CM | POA: Diagnosis not present

## 2023-08-04 DIAGNOSIS — Z23 Encounter for immunization: Secondary | ICD-10-CM | POA: Diagnosis not present

## 2023-10-29 ENCOUNTER — Ambulatory Visit (INDEPENDENT_AMBULATORY_CARE_PROVIDER_SITE_OTHER): Payer: Medicare Other | Admitting: Family Medicine

## 2023-10-29 ENCOUNTER — Encounter: Payer: Self-pay | Admitting: Family Medicine

## 2023-10-29 VITALS — BP 138/80 | HR 72 | Temp 98.6°F | Ht 63.0 in | Wt 172.0 lb

## 2023-10-29 DIAGNOSIS — E119 Type 2 diabetes mellitus without complications: Secondary | ICD-10-CM | POA: Diagnosis not present

## 2023-10-29 DIAGNOSIS — K529 Noninfective gastroenteritis and colitis, unspecified: Secondary | ICD-10-CM

## 2023-10-29 DIAGNOSIS — I10 Essential (primary) hypertension: Secondary | ICD-10-CM | POA: Diagnosis not present

## 2023-10-29 LAB — CBC AND DIFFERENTIAL
Hemoglobin: 14.3 (ref 12.0–16.0)
WBC: 4.4

## 2023-10-29 LAB — BASIC METABOLIC PANEL WITH GFR
BUN: 25 — AB (ref 4–21)
Chloride: 105 (ref 99–108)
Creatinine: 0.8 (ref 0.5–1.1)
Glucose: 101
Potassium: 3.7 meq/L (ref 3.5–5.1)
Sodium: 141 (ref 137–147)

## 2023-10-29 LAB — COMPREHENSIVE METABOLIC PANEL WITH GFR
Albumin: 4.4 (ref 3.5–5.0)
Calcium: 9 (ref 8.7–10.7)
eGFR: 67.75

## 2023-10-29 LAB — HEPATIC FUNCTION PANEL
ALT: 11 U/L (ref 7–35)
AST: 14 (ref 13–35)
Alkaline Phosphatase: 96 (ref 25–125)

## 2023-10-29 NOTE — Progress Notes (Signed)
Subjective  CC:  Chief Complaint  Patient presents with   Diarrhea    Pt stated that she has been having diarrhea for the past 6mos off/on. She also sytated that she changed her diet.    HPI: Amanda Collier is a 77 y.o. female who presents to the office today to address the problems listed above in the chief complaint. 77 year old with hypertension, diabetes, hyperlipidemia overdue for recheck presents due to a 84-month history of intermittent loose diarrhea.  She reports that she will have loose stool that is responsive to 1 Imodium.  Will clear up her symptoms for about 24 hours and then the loose stool reappear.  No blood or mucus in stool.  No abdominal pain.  No gas.  No history of irritable bowel syndrome.  Unrelated to diet.  No new medications.  She has been highly stressed, one of her sisters passed earlier this year and her other sister was recently diagnosed with colon cancer.  She has been traveling back and forth to [place] state to help her sisters and family members.  She denies fever, travel outside the country, recent antibiotic use.  She is not on metformin.  I reviewed her medication list and do not see any other medications that typically cause diarrhea.  Otherwise she is doing fine without fevers, chills, weight loss, change in appetite. She is overdue for diabetic visit, blood pressure has been controlled.  She is compliant with her medications.  Assessment  1. Chronic diarrhea of unknown origin   2. Controlled type 2 diabetes mellitus without complication, without long-term current use of insulin (HCC)   3. Essential hypertension      Plan  Chronic diarrhea: Controlled by Imodium but persist.  Check lab work, stool studies and follow-up with GI if no etiology apparent.  I did discuss that it could be stress related.  Patient to monitor this pattern.  She says she is coping.  No red flag symptoms present. Her blood pressure is fairly well-controlled.  Will reassess  at her next visit. Check A1c. Gust need to make mammogram appointment.  Follow up: For chronic medical problem follow-up. Visit date not found  Orders Placed This Encounter  Procedures   Clostridium difficile Toxin B, Qualitative, Real-Time PCR   Calprotectin, Fecal   CBC with Differential/Platelet   Comprehensive metabolic panel   Gastrointestinal Pathogen Pnl RT, PCR   TSH   Hemoglobin A1c   No orders of the defined types were placed in this encounter.     I reviewed the patients updated PMH, FH, and SocHx.    Patient Active Problem List   Diagnosis Date Noted   Controlled type 2 diabetes mellitus without complication, without long-term current use of insulin (HCC) 12/13/2021    Priority: High   History of subdural hematoma 12/13/2021    Priority: High   Hx of traumatic brain injury 03/09/2021    Priority: High   Combined hyperlipidemia associated with type 2 diabetes mellitus (HCC) 12/26/2020    Priority: High   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HCC) 12/24/2020    Priority: High   Essential hypertension 12/17/2015    Priority: High   History of colonic polyps 12/24/2020    Priority: Medium    Neurogenic bladder     Priority: Medium    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   atorvastatin (LIPITOR) 10 MG tablet TAKE ONE TABLET BY MOUTH EVERY NIGHT AT BEDTIME   clopidogrel (PLAVIX) 75 MG tablet TAKE ONE TABLET BY MOUTH DAILY   divalproex (DEPAKOTE ER) 250 MG  24 hr tablet Take 1-3 tablets (250-750 mg total) by mouth See admin instructions. Take 250 mg by mouth in the morning and 750 mg at bedtime   losartan (COZAAR) 50 MG tablet Take 1 tablet (50 mg total) by mouth daily.   topiramate (TOPAMAX) 50 MG tablet Take 1 tablet (50 mg total) by mouth at bedtime.    Allergies: Patient is allergic to codeine, lexapro [escitalopram oxalate], lisinopril, and lotensin [benazepril hcl]. Family History: Patient family history includes Alzheimer's disease in her mother; Heart attack in her maternal uncle; Heart disease in her father. She was adopted. Social History:  Patient  reports that she has never smoked. She has never used smokeless tobacco. She reports current alcohol use of about 7.0 standard drinks of alcohol per week. She reports that she does not use drugs.  Review of Systems: Constitutional: Negative for fever malaise or anorexia Cardiovascular: negative for chest pain Respiratory: negative for SOB or persistent cough Gastrointestinal: negative for abdominal pain  Objective  Vitals: BP 138/80   Pulse 72   Temp 98.6 F (37 C)   Ht 5\' 3"  (1.6 m)   Wt 172 lb (78 kg)   LMP 11/14/2003   SpO2 99%   BMI 30.47 kg/m  General: no acute distress , A&Ox3, appears well HEENT: PEERL, conjunctiva normal, neck is supple Cardiovascular:  RRR without murmur or gallop.  Respiratory:  Good breath sounds bilaterally, CTAB with normal respiratory effort Abdomen: Soft, nontender, nondistended, positive bowel sounds, no hepatosplenomegaly.  No masses Skin:  Warm, no rashes  Commons side effects, risks, benefits, and alternatives for medications and treatment plan prescribed today were discussed, and the patient expressed understanding of the given instructions. Patient is instructed to call or message via MyChart if he/she has any questions or concerns regarding our treatment plan. No barriers to understanding were identified. We discussed Red Flag symptoms and  signs in detail. Patient expressed understanding regarding what to do in case of urgent or emergency type symptoms.  Medication list was reconciled, printed and provided to the patient in AVS. Patient instructions and summary information was reviewed with the patient as documented in the AVS. This note was prepared with assistance of Dragon voice recognition software. Occasional wrong-word or sound-a-like substitutions may have occurred due to the inherent limitations of voice recognition software

## 2023-10-29 NOTE — Patient Instructions (Signed)
Please return for your complete physical; come fasting for blood work.   If you have any questions or concerns, please don't hesitate to send me a message via MyChart or call the office at [phone]. Thank you for visiting with [place] today! It's our pleasure caring for you.

## 2023-10-30 LAB — COMPREHENSIVE METABOLIC PANEL
ALT: 11 U/L (ref 0–35)
AST: 14 U/L (ref 0–37)
Albumin: 4.4 g/dL (ref 3.5–5.2)
Alkaline Phosphatase: 96 U/L (ref 39–117)
BUN: 25 mg/dL — ABNORMAL HIGH (ref 6–23)
CO2: 25 meq/L (ref 19–32)
Calcium: 9 mg/dL (ref 8.4–10.5)
Chloride: 105 meq/L (ref 96–112)
Creatinine, Ser: 0.83 mg/dL (ref 0.40–1.20)
GFR: 67.75 mL/min (ref >60.00)
Glucose, Bld: 101 mg/dL — ABNORMAL HIGH (ref 70–99)
Potassium: 3.7 meq/L (ref 3.5–5.1)
Sodium: 141 meq/L (ref 135–145)
Total Bilirubin: 0.5 mg/dL (ref 0.2–1.2)
Total Protein: 7 g/dL (ref 6.0–8.3)

## 2023-10-30 LAB — HEMOGLOBIN A1C: Hgb A1c MFr Bld: 6.8 % — ABNORMAL HIGH (ref 4.6–6.5)

## 2023-10-30 LAB — CBC WITH DIFFERENTIAL/PLATELET
Basophils Absolute: 0.1 10*3/uL (ref 0.0–0.1)
Basophils Relative: 1.2 % (ref 0.0–3.0)
Eosinophils Absolute: 0.1 10*3/uL (ref 0.0–0.7)
Eosinophils Relative: 2.5 % (ref 0.0–5.0)
HCT: 42.8 % (ref 36.0–46.0)
Hemoglobin: 14.3 g/dL (ref 12.0–15.0)
Lymphocytes Relative: 34.6 % (ref 12.0–46.0)
Lymphs Abs: 1.5 10*3/uL (ref 0.7–4.0)
MCHC: 33.4 g/dL (ref 30.0–36.0)
MCV: 99.4 fL (ref 78.0–100.0)
Monocytes Absolute: 0.4 10*3/uL (ref 0.1–1.0)
Monocytes Relative: 8.9 % (ref 3.0–12.0)
Neutro Abs: 2.3 10*3/uL (ref 1.4–7.7)
Neutrophils Relative %: 52.8 % (ref 43.0–77.0)
Platelets: 234 10*3/uL (ref 150.0–400.0)
RBC: 4.3 Mil/uL (ref 3.87–5.11)
RDW: 14 % (ref 11.5–15.5)
WBC: 4.4 10*3/uL (ref 4.0–10.5)

## 2023-10-30 LAB — TSH: TSH: 3.42 u[IU]/mL (ref 0.35–5.50)

## 2023-10-30 NOTE — Progress Notes (Signed)
See mychart note Dear Ms. Chaisson, Your blood test results all look fine.  We will see what your stool tests show.  Sincerely, Dr. Mardelle Matte

## 2023-11-02 ENCOUNTER — Other Ambulatory Visit: Payer: Medicare Other

## 2023-11-02 DIAGNOSIS — K529 Noninfective gastroenteritis and colitis, unspecified: Secondary | ICD-10-CM | POA: Diagnosis not present

## 2023-11-04 LAB — CALPROTECTIN, FECAL: Calprotectin, Fecal: 8 ug/g (ref 0–120)

## 2023-11-09 LAB — CLOSTRIDIUM DIFFICILE TOXIN B, QUALITATIVE, REAL-TIME PCR: Toxigenic C. Difficile by PCR: NOT DETECTED

## 2023-11-09 LAB — GASTROINTESTINAL PATHOGEN PNL

## 2023-11-12 ENCOUNTER — Other Ambulatory Visit: Payer: Self-pay | Admitting: Family Medicine

## 2023-11-12 NOTE — Progress Notes (Signed)
Looks like the stool pathogen test was canceled?? Please call patient, if still with symptoms, we need this testing done. Can find out from lab what happened and if we need to have her return with another sample? Thanks, Dr. Mardelle Matte '

## 2023-11-16 ENCOUNTER — Telehealth: Payer: Self-pay

## 2023-11-16 NOTE — Telephone Encounter (Signed)
 Copied from CRM [id number]. Topic: General - Other >> Nov 15, 2023  3:18 PM Drema MATSU wrote: Patient will be out of town starting tomorrow 11/16/2023 and wont be back until 01/15. She wanted to let Avelina know that she did receive her message and will try to contact her when she gets back.  Noted

## 2023-11-16 NOTE — Telephone Encounter (Signed)
 Copied from CRM [id number]. Topic: General - Other >> Nov 15, 2023 11:52 AM Corin V wrote: Reason for CRM: Patient stated she received a voicemail from Dr. Javan nurse, Avelina, and was requesting a call back regarding the matter they had been discussing. She did not want to divulge any details at the time of the call. Please return call to patient when available.  Pt left message stating that she will be out of town starting 1/3-1/15 and will contact the office when she returns.

## 2023-12-29 ENCOUNTER — Other Ambulatory Visit: Payer: Self-pay | Admitting: Family Medicine

## 2024-01-24 ENCOUNTER — Encounter: Payer: Self-pay | Admitting: Family Medicine

## 2024-01-24 ENCOUNTER — Ambulatory Visit (INDEPENDENT_AMBULATORY_CARE_PROVIDER_SITE_OTHER): Payer: Medicare Other | Admitting: Family Medicine

## 2024-01-24 ENCOUNTER — Other Ambulatory Visit: Payer: Self-pay | Admitting: Family Medicine

## 2024-01-24 ENCOUNTER — Telehealth: Payer: Self-pay | Admitting: Family Medicine

## 2024-01-24 VITALS — BP 132/77 | HR 74 | Temp 98.1°F | Ht 63.0 in | Wt 166.2 lb

## 2024-01-24 DIAGNOSIS — E1169 Type 2 diabetes mellitus with other specified complication: Secondary | ICD-10-CM | POA: Diagnosis not present

## 2024-01-24 DIAGNOSIS — K529 Noninfective gastroenteritis and colitis, unspecified: Secondary | ICD-10-CM

## 2024-01-24 DIAGNOSIS — I10 Essential (primary) hypertension: Secondary | ICD-10-CM

## 2024-01-24 DIAGNOSIS — Z8679 Personal history of other diseases of the circulatory system: Secondary | ICD-10-CM

## 2024-01-24 DIAGNOSIS — F3110 Bipolar disorder, current episode manic without psychotic features, unspecified: Secondary | ICD-10-CM

## 2024-01-24 DIAGNOSIS — Z860100 Personal history of colon polyps, unspecified: Secondary | ICD-10-CM

## 2024-01-24 DIAGNOSIS — E782 Mixed hyperlipidemia: Secondary | ICD-10-CM | POA: Diagnosis not present

## 2024-01-24 DIAGNOSIS — Z780 Asymptomatic menopausal state: Secondary | ICD-10-CM

## 2024-01-24 DIAGNOSIS — R2681 Unsteadiness on feet: Secondary | ICD-10-CM

## 2024-01-24 DIAGNOSIS — E119 Type 2 diabetes mellitus without complications: Secondary | ICD-10-CM

## 2024-01-24 DIAGNOSIS — Z1231 Encounter for screening mammogram for malignant neoplasm of breast: Secondary | ICD-10-CM

## 2024-01-24 DIAGNOSIS — Z13820 Encounter for screening for osteoporosis: Secondary | ICD-10-CM | POA: Insufficient documentation

## 2024-01-24 DIAGNOSIS — M8580 Other specified disorders of bone density and structure, unspecified site: Secondary | ICD-10-CM | POA: Insufficient documentation

## 2024-01-24 LAB — LIPID PANEL
Cholesterol: 146 mg/dL (ref 0–200)
HDL: 64.8 mg/dL (ref >39.00)
LDL Cholesterol: 53 mg/dL (ref 0–99)
NonHDL: 80.73
Total CHOL/HDL Ratio: 2
Triglycerides: 140 mg/dL (ref 0.0–149.0)
VLDL: 28 mg/dL (ref 0.0–40.0)

## 2024-01-24 LAB — MICROALBUMIN / CREATININE URINE RATIO
Creatinine,U: 178.5 mg/dL
Microalb Creat Ratio: 4 mg/g (ref 0.0–30.0)
Microalb, Ur: 0.7 mg/dL (ref 0.0–1.9)

## 2024-01-24 LAB — HEMOGLOBIN A1C: Hgb A1c MFr Bld: 7.1 % — ABNORMAL HIGH (ref 4.6–6.5)

## 2024-01-24 MED ORDER — LOSARTAN POTASSIUM 100 MG PO TABS: 100.0000 mg | ORAL_TABLET | Freq: Every day | ORAL | 3 refills | Status: AC

## 2024-01-24 NOTE — Patient Instructions (Addendum)
 Please return in 12 months for your annual complete physical; please come fasting.   I will release your lab results to you on your MyChart account with further instructions. You may see the results before I do, but when I review them I will send you a message with my report or have my assistant call you if things need to be discussed. Please reply to my message with any questions. Thank you!   If you have any questions or concerns, please don't hesitate to send me a message via MyChart or call the office at [phone]. Thank you for visiting with [place] today! It's our pleasure caring for you.   Please call the office checked below to schedule your appointment for your mammogram and/or bone density screen (the checked studies were ordered): [x]   Mammogram  [x]   Bone Density  []   [facility]     [address] [place], [place]        [phone]         [x]   [facility]  [address] [place], [place]  [phone]   VISIT SUMMARY:  During today's visit, we discussed your request for a statin refill and your concerns about intermittent diarrhea. We also reviewed your blood pressure and cholesterol management, and addressed your general health maintenance needs, including scheduling a mammogram and bone density scan.  YOUR PLAN:  -DIARRHEA: Diarrhea is characterized by loose, watery stools that occur more frequently than usual. You are experiencing intermittent diarrhea with urgency but no daily symptoms. We recommend trying an over-the-counter probiotic like Florastor for one month. If your symptoms persist, please contact our office with your preferred gastroenterologist for a referral.  -HYPERTENSION: Hypertension, or high blood pressure, can increase your risk of heart disease and stroke. Your diastolic pressure is currently at 83 mmHg, and our goal is to bring it below 80 mmHg. We have increased your losartan dose to 100 mg to help achieve  better control.  -HYPERLIPIDEMIA: Hyperlipidemia means having high levels of cholesterol in your blood, which can increase your risk of heart disease. We will check your cholesterol levels today and have refilled your statin medication to help manage this condition.  -GENERAL HEALTH MAINTENANCE: General health maintenance includes routine screenings and tests to monitor your overall health. You are due for a mammogram and bone density scan. Please visit our office to schedule these appointments and go to the lab for your cholesterol and urine tests.  INSTRUCTIONS:  Please follow up on scheduling your mammogram and bone density scan by visiting our office. Additionally, go to the lab for your cholesterol and urine tests. If your diarrhea symptoms persist, contact our office with your preferred gastroenterologist for a referral.

## 2024-01-24 NOTE — Telephone Encounter (Signed)
 Copied from CRM [id number]. Topic: Clinical - Medication Question >> Jan 24, 2024  1:49 PM Amanda Collier wrote: Reason for CRM: Patient states at her visit today she was suppose to get Collier probiotic prescribed - she would like Collier callback with the name of the medication.  Pt called, [facility] to discuss medication.   - Recommend over-the-counter probiotic (e.g., Florastor) for one month.

## 2024-01-24 NOTE — Telephone Encounter (Signed)
 2nd attempt, pt called [facility] to discuss medication. Will route to practice for review.

## 2024-01-24 NOTE — Progress Notes (Signed)
 Patient Subjective  Chief Complaint  Patient presents with   Annual Exam   Diabetes    Pt here for Annual Exam and is currently fasting. Pt will schedule mammogram    HPI: Amanda Collier is a 78 y.o. female who presents to [facility] today for a Female Wellness Visit. She also has the concerns and/or needs as listed above in the chief complaint. These will be addressed in addition to the Health Maintenance Visit.   Wellness Visit: annual visit with health maintenance review and exam  HM: no further CRC screens indicated. Mammo needs to be done and dexa due: last was normal 2018. Imms are current. Feeling well. Traveling on a cruise in may. Has been out west visiting [place]. No falls or moos concerns.  Chronic disease f/u and/or acute problem visit: (deemed necessary to be done in addition to the wellness visit): Discussed the use of AI scribe software for clinical note transcription with the patient, who gave verbal consent to proceed.  History of Present Illness   The patient presents with a request for a statin refill and concerns about intermittent diarrhea and f/u chronic medical problems.   She experiences intermittent diarrhea characterized by urgency and occasional watery stools. See last note. Has improved overall but  still comes and goes. The episodes are sporadic, with normal stools most of the time, and do not occur daily. No associated cramps or vomiting. She is considering dietary factors, such as salads and [place] food, as potential triggers. No recent changes in medication have been made, and she has not tried any new treatments for this issue. She has multiple bowel movements a day, typically at least two in the morning, and does not wake up at night to use the bathroom. No chest pain, lightheadedness, falls, or abdominal discomfort are reported. Has been to [facility]. Wants help with it since will be traveling abroad. Gi labs were unremarkable, c.  Diff neg but GI pathogen panel was not done due to poor sample.   She is requesting a refill for her statin medication. Her cholesterol levels are being checked today. Fasting. No side effects. Goal ldl < 100  She has a history of a mini-stroke, which has resulted in a slight tremor, particularly noticeable when performing tasks such as putting on a mask. There is no pain associated with the tremor.  BP is fairly well controlled however diastolics can be in the 80s.. Feeling well. Taking medications w/o adverse effects. No symptoms of CHF, angina; no palpitations, sob, cp or lower extremity edema. Compliant with meds.  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 Stable on meds      Assessment  1. Controlled type 2 diabetes mellitus without complication, without long-term current use of insulin (HCC)   2. Essential hypertension   3. Combined hyperlipidemia associated with type 2 diabetes mellitus (HCC)   4. History of subdural hematoma   5. 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HCC)   6. Unsteady gait   7. Osteoporosis screening   8. History of colonic polyps   9. Asymptomatic menopausal state   10. Screening mammogram for breast cancer   11. Chronic diarrhea of unknown origin      Plan  Female Wellness Visit: Age appropriate Health Maintenance and Prevention measures were discussed with patient. Included topics are cancer screening recommendations, ways to keep healthy (see AVS) including dietary and exercise recommendations, regular eye and dental care, use of seat belts,  and avoidance of moderate alcohol use and tobacco use.  BMI: discussed patient's BMI and encouraged positive lifestyle modifications to help get to or maintain a target BMI. HM needs and immunizations were addressed and ordered. See below for orders. See HM and immunization section for updates. Routine labs and screening tests ordered including cmp, cbc and lipids where appropriate. Discussed recommendations regarding Vit D  and calcium supplementation (see AVS)  Chronic disease management visit and/or acute problem visit: Assessment and Plan    Diarrhea Intermittent diarrhea with urgency, not persistent or daily. Unlikely related to medications. Possible gastroenterologist referral if symptoms persist. - Recommend over-the-counter probiotic (e.g., Florastor) for one month. - Consider referral to gastroenterologist if symptoms persist. - Contact office with preferred gastroenterologist for referral.  Hypertension Diastolic pressure at 83 mmHg. Goal is below 80 mmHg. Increase losartan for better control. - Increase losartan dose to 100 mg.  Hyperlipidemia Requires statin refill. Cholesterol levels to be checked for effective management. - Check cholesterol levels. - Refill statin medication.  General Health Maintenance Due for mammogram and bone density scan. Difficulty scheduling appointments. - Order mammogram and bone density scan.  Follow-up Advised to follow up on health maintenance and diagnostic tasks. Upcoming travel may impact scheduling. - Visit office to schedule mammogram and bone density scan. - Go to lab for cholesterol and urine tests.      I spent a total of 42 minutes for this patient encounter. Time spent included preparation, face-to-face counseling with the patient and coordination of care, review of chart and records, and documentation of the encounter.  Follow up: 12 mo for cpe  Orders Placed This Encounter  Procedures   MM DIGITAL SCREENING BILATERAL   DG Bone Density   Lipid panel   Hemoglobin A1c   Microalbumin / creatinine urine ratio   Meds ordered this encounter  Medications   losartan (COZAAR) 100 MG tablet    Sig: Take 1 tablet (100 mg total) by mouth daily.    Dispense:  90 tablet    Refill:  3      Body mass index is 29.44 kg/m. Wt Readings from Last 3 Encounters:  01/24/24 166 lb 3.2 oz (75.4 kg)  10/29/23 172 lb (78 kg)  02/22/23 172 lb 6.4 oz (78.2  kg)     Patient Active Problem List   Diagnosis Date Noted Date Diagnosed   Controlled type 2 diabetes mellitus without complication, without long-term current use of insulin (HCC) 12/13/2021     Priority: High   History of subdural hematoma 12/13/2021     Priority: High   Hx of traumatic brain injury 03/09/2021     Priority: High   Combined hyperlipidemia associated with type 2 diabetes mellitus (HCC) 12/26/2020     Priority: High   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HCC) 12/24/2020     Priority: High   Essential hypertension 12/17/2015     Priority: High   History of colonic polyps 12/24/2020     Priority: Medium    Neurogenic bladder      Priority: Medium    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  [facility].  Reviewed in chart.    Heart murmur 03/21/2021     Priority: Low    Last echo 2018; calcified aortic valve w/o regurg or stenosis. Nl EF.     Right-sided sensorineural hearing loss 09/29/2020     Priority: Low   Status post craniotomy 06/24/2020     Priority: Low   Health Maintenance  Topic Date Due   MAMMOGRAM  08/10/2023   Medicare Annual Wellness (AWV)  12/26/2023   Diabetic kidney evaluation - Urine ACR  02/22/2024   COVID-19 Vaccine (5 - 2024-25 season) 02/09/2024 (Originally 07/15/2023)   OPHTHALMOLOGY EXAM  03/29/2024   HEMOGLOBIN A1C  04/28/2024   DEXA SCAN  08/12/2024   Diabetic kidney evaluation - eGFR measurement  10/28/2024   FOOT EXAM  01/23/2025   DTaP/Tdap/Td (3 - Td or Tdap) 02/22/2027   Pneumonia Vaccine 82+ Years old  Completed   INFLUENZA VACCINE  Completed   Hepatitis C Screening  Completed   Zoster Vaccines- Shingrix   Completed   HPV VACCINES  Aged Out   Colonoscopy  Discontinued   Immunization History  Administered Date(s) Administered   Fluad Quad(high Dose 65+) 08/08/2021, 07/28/2022   Influenza Split 09/05/2006, 09/03/2008, 08/24/2009, 08/08/2010, 08/30/2012, 09/06/2017, 09/16/2018, 07/26/2022   Influenza, High Dose Seasonal PF 08/30/2012   Influenza,inj,Quad PF,6+ Mos 07/30/2019   Influenza,inj,quad, With Preservative 08/18/2014, 09/24/2015   PFIZER(Purple Top)SARS-COV-2 Vaccination 12/19/2019, 01/13/2020, 09/23/2020   Pfizer Covid-19 Vaccine Bivalent Booster 21yrs & up 09/25/2021   Pneumococcal Conjugate-13 04/13/2014   Pneumococcal Polysaccharide-23 10/18/2011   Respiratory Syncytial Virus Vaccine,Recomb Aduvanted(Arexvy) 07/26/2022   Tdap 01/30/2007, 02/21/2017   Zoster Recombinant(Shingrix) 03/25/2020, 11/08/2020   Zoster, Live 09/28/2008, 03/25/2020, 11/08/2020   We updated and reviewed the patient's past history in detail and it is documented below. Allergies: Patient is allergic to codeine, lexapro [escitalopram oxalate], lisinopril, and lotensin [benazepril hcl]. Past Medical History Patient  has a past medical history of Anxiety, Diabetes mellitus without complication (HCC), Headache syndrome (12/18/2016), Heart murmur (03/21/2021), High blood pressure, Hypertension, and TIA (transient ischemic attack) (07/2021). Past Surgical History Patient  has a past surgical history that includes Craniotomy (Bilateral, 06/24/2020) and Craniotomy (Right, 06/26/2020). Family History: Patient family history includes Alzheimer's disease in her mother; Heart attack in her maternal uncle; Heart disease in her father. She was adopted. Social History:  Patient  reports that she has never smoked. She has never used smokeless tobacco. She reports current alcohol use of about 7.0 standard drinks of alcohol per week. She reports that she does not use drugs.  Review of Systems: Constitutional: negative for  fever or malaise Ophthalmic: negative for photophobia, double vision or loss of vision Cardiovascular: negative for chest pain, dyspnea on exertion, or new LE swelling Respiratory: negative for SOB or persistent cough Gastrointestinal: negative for abdominal pain, change in bowel habits or melena Genitourinary: negative for dysuria or gross hematuria, no abnormal uterine bleeding or disharge Musculoskeletal: negative for new gait disturbance or muscular weakness Integumentary: negative for new or persistent rashes, no breast lumps Neurological: negative for TIA or stroke symptoms Psychiatric: negative for SI or delusions Allergic/Immunologic: negative for hives  Patient Care Team    Relationship Specialty Notifications Start End  Willow Ora, MD PCP - General Family Medicine  12/24/20   Milagros Evener, MD Consulting Physician Psychiatry  12/18/16   Coletta Memos, MD Consulting Physician Neurosurgery  12/24/20   Levert Feinstein, MD Consulting Physician Neurology  12/24/20  Ranelle Oyster, MD Consulting Physician Physical Medicine and Rehabilitation  12/24/20     Objective  Vitals: BP 132/77   Pulse 74   Temp 98.1 F (36.7 C)   Ht 5\' 3"  (1.6 m)   Wt 166 lb 3.2 oz (75.4 kg)   LMP 11/14/2003   SpO2 95%   BMI 29.44 kg/m  General:  Well developed, well nourished, no acute distress  Psych:  Alert and orientedx3,normal mood and affect HEENT:  Normocephalic, atraumatic, non-icteric sclera,  supple neck without adenopathy, mass or thyromegaly Cardiovascular:  Normal S1, S2, RRR without gallop, rub or murmur Respiratory:  Good breath sounds bilaterally, CTAB with normal respiratory effort Gastrointestinal: normal bowel sounds, soft, non-tender, no noted masses. No HSM MSK: extremities without edema, joints without erythema or swelling Neurologic:    Mental status is normal.  Gross motor and sensory exams are normal.  No tremor  Commons side effects, risks, benefits, and alternatives for  medications and treatment plan prescribed today were discussed, and the patient expressed understanding of the given instructions. Patient is instructed to call or message via MyChart if he/she has any questions or concerns regarding our treatment plan. No barriers to understanding were identified. We discussed Red Flag symptoms and signs in detail. Patient expressed understanding regarding what to do in case of urgent or emergency type symptoms.  Medication list was reconciled, printed and provided to the patient in AVS. Patient instructions and summary information was reviewed with the patient as documented in the AVS. This note was prepared with assistance of Dragon voice recognition software. Occasional wrong-word or sound-a-like substitutions may have occurred due to the inherent limitations of voice recognition software

## 2024-01-25 ENCOUNTER — Other Ambulatory Visit: Payer: Self-pay

## 2024-01-25 ENCOUNTER — Telehealth: Payer: Self-pay

## 2024-01-25 DIAGNOSIS — Z13820 Encounter for screening for osteoporosis: Secondary | ICD-10-CM

## 2024-01-25 DIAGNOSIS — Z1231 Encounter for screening mammogram for malignant neoplasm of breast: Secondary | ICD-10-CM | POA: Diagnosis not present

## 2024-01-25 LAB — HM MAMMOGRAPHY

## 2024-01-25 NOTE — Telephone Encounter (Signed)
 Noted.

## 2024-01-25 NOTE — Telephone Encounter (Signed)
 Copied from CRM [id number]. Topic: Clinical - Request for Lab/Test Order >> Jan 24, 2024  2:12 PM Sim Boast F wrote: Reason for CRM: Patient seen Dr. Mardelle Matte today, she is currently at [facility] for mammogram and request that an order for a bone density be faxed to [phone]  Dr. Mardelle Matte has ordered the DEXA

## 2024-01-28 ENCOUNTER — Encounter: Payer: Self-pay | Admitting: Family Medicine

## 2024-01-29 ENCOUNTER — Encounter: Payer: Self-pay | Admitting: Family Medicine

## 2024-01-29 ENCOUNTER — Other Ambulatory Visit: Payer: Self-pay | Admitting: Family Medicine

## 2024-01-29 NOTE — Progress Notes (Signed)
 See mychart note Dear Amanda Collier, Your lab work shows that your diabetic control is slightly worsening again.  Your A1c was 7.1.  I would like this to be under 7.  Please work on improving your diet.  I recommend rechecking in 3 months.  If it remains elevated we can consider starting medication to help.  Your other results look good. Sincerely, Dr. Mardelle Matte

## 2024-02-01 DIAGNOSIS — Z780 Asymptomatic menopausal state: Secondary | ICD-10-CM | POA: Diagnosis not present

## 2024-02-01 DIAGNOSIS — M8588 Other specified disorders of bone density and structure, other site: Secondary | ICD-10-CM | POA: Diagnosis not present

## 2024-02-01 LAB — HM DEXA SCAN

## 2024-02-05 ENCOUNTER — Encounter: Payer: Self-pay | Admitting: Family Medicine

## 2024-02-17 ENCOUNTER — Encounter: Payer: Self-pay | Admitting: Family Medicine

## 2024-02-17 NOTE — Progress Notes (Signed)
 See mychart note.   Amanda Collier, Thank you for getting your bone density screening test done. I have reviewed the results. Your results now show osteopenia or mild low bone density at all measured sites. This show bone loss since your last test in 2022. Please work on weight bearing exercise (walking, strength training for legs and arms) and take vit D and Calcium as recommended. We will recheck in 2 years.   Sincerely, Dr. Mardelle Matte

## 2024-02-18 DIAGNOSIS — Z23 Encounter for immunization: Secondary | ICD-10-CM | POA: Diagnosis not present

## 2024-02-26 ENCOUNTER — Ambulatory Visit: Payer: Medicare Other

## 2024-03-31 ENCOUNTER — Telehealth: Admitting: Family Medicine

## 2024-03-31 ENCOUNTER — Encounter: Payer: Self-pay | Admitting: Family Medicine

## 2024-03-31 VITALS — Ht 63.0 in

## 2024-03-31 DIAGNOSIS — J208 Acute bronchitis due to other specified organisms: Secondary | ICD-10-CM | POA: Diagnosis not present

## 2024-03-31 DIAGNOSIS — B9689 Other specified bacterial agents as the cause of diseases classified elsewhere: Secondary | ICD-10-CM | POA: Diagnosis not present

## 2024-03-31 MED ORDER — AZITHROMYCIN 250 MG PO TABS: ORAL_TABLET | ORAL | 0 refills | Status: DC

## 2024-03-31 NOTE — Progress Notes (Signed)
 Subjective  CC:  Chief Complaint  Patient presents with   Cough    Patient recently arrived from out of town and reports a dry cough for 1 1/2 weeks. States they have taken two COVID tests, both of which were negative. Denies fever or elevated temperature.   Virtual Visit via Video Note I connected with Amanda Collier on 03/31/24 at  2:30 PM EDT by a video enabled telemedicine application and verified that I am speaking with the correct person using two identifiers. Location patient: Home Location provider: [facility] Persons participating in the virtual visit: CHANDI NICKLIN, Luevenia Saha, MD Shelton Dibbles, CMA  I discussed the limitations of evaluation and management by telemedicine and the availability of in person appointments. The patient expressed understanding and agreed to proceed.  HPI: SUBJECTIVE:  Amanda Collier is a 78 y.o. female who complains of congestion, nasal blockage, post nasal drip, cough described as nocturnal, paroxysmal, and productive and denies sinus, high fevers, SOB, chest pain or significant GI symptoms. Symptoms have been present for over two weeks. Started while traveling. Neg covid tests x 2. No sob . She denies a history of anorexia, dizziness, vomiting and wheezing. She denies a history of asthma or COPD. Patient does not smoke cigarettes.  Assessment  1. Acute bacterial bronchitis      Plan  Discussion:  Treat for bacterial bronchitis due to prolonged course and worsening symptoms. Education regarding differences between viral and bacterial infections and treatment options are discussed.  Supportive care measures are recommended.  We discussed the use of mucolytic's, decongestants, antihistamines and antitussives as needed.  Tylenol  or Advil are recommended if needed.  Follow up: prn   No orders of the defined types were placed in this encounter.  Meds ordered this encounter  Medications   azithromycin   (ZITHROMAX ) 250 MG tablet    Sig: Take 2 tabs today, then 1 tab daily for 4 days    Dispense:  1 each    Refill:  0      I reviewed the patients updated PMH, FH, and SocHx.  Social History: Patient  reports that she has never smoked. She has never used smokeless tobacco. She reports current alcohol use of about 7.0 standard drinks of alcohol per week. She reports that she does not use drugs.  Patient Active Problem List   Diagnosis Date Noted   Controlled type 2 diabetes mellitus without complication, without long-term current use of insulin (HCC) 12/13/2021    Priority: High   History of subdural hematoma 12/13/2021    Priority: High   Hx of traumatic brain injury 03/09/2021    Priority: High   Combined hyperlipidemia associated with type 2 diabetes mellitus (HCC) 12/26/2020    Priority: High   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HCC) 12/24/2020    Priority: High   Essential hypertension 12/17/2015    Priority: High   Osteopenia 01/24/2024    Priority: Medium    History of colonic polyps 12/24/2020    Priority: Medium    Neurogenic bladder     Priority: Medium    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)     Priority: Medium    Spinal stenosis in cervical region     Priority: Medium    Unsteady gait 06/23/2020    Priority: Medium    Headache syndrome 12/18/2016    Priority: Medium    Heart murmur 03/21/2021  Priority: Low   Right-sided sensorineural hearing loss 09/29/2020    Priority: Low   Status post craniotomy 06/24/2020    Priority: Low    Review of Systems: Cardiovascular: negative for chest pain Respiratory: negative for SOB or hemoptysis Gastrointestinal: negative for abdominal pain Genitourinary: negative for dysuria or gross hematuria Current Meds  Medication Sig   Acetaminophen  (TYLENOL  ARTHRITIS PAIN PO) Take 600 mg by mouth in the morning and at bedtime.   atorvastatin  (LIPITOR) 10 MG tablet TAKE 1 TABLET BY MOUTH EVERY  NIGHT AT BEDTIME   clopidogrel  (PLAVIX ) 75 MG tablet TAKE 1 TABLET BY MOUTH DAILY   divalproex  (DEPAKOTE  ER) 250 MG 24 hr tablet Take 1-3 tablets (250-750 mg total) by mouth See admin instructions. Take 250 mg by mouth in the morning and 750 mg at bedtime   losartan  (COZAAR ) 100 MG tablet Take 1 tablet (100 mg total) by mouth daily.   topiramate  (TOPAMAX ) 50 MG tablet TAKE 1 TABLET BY MOUTH AT BEDTIME   azithromycin  (ZITHROMAX ) 250 MG tablet Take 2 tabs today, then 1 tab daily for 4 days    Objective  Vitals: Ht 5\' 3"  (1.6 m)   LMP 11/14/2003   BMI 29.44 kg/m  General: no acute distress appears well Psych:  Alert and oriented, normal mood and affect   Commons side effects, risks, benefits, and alternatives for medications and treatment plan prescribed today were discussed, and the patient expressed understanding of the given instructions. Patient is instructed to call or message via MyChart if he/she has any questions or concerns regarding our treatment plan. No barriers to understanding were identified. We discussed Red Flag symptoms and signs in detail. Patient expressed understanding regarding what to do in case of urgent or emergency type symptoms.  Medication list was reconciled, printed and provided to the patient in AVS. Patient instructions and summary information was reviewed with the patient as documented in the AVS. This note was prepared with assistance of Dragon voice recognition software. Occasional wrong-word or sound-a-like substitutions may have occurred due to the inherent limitations of voice recognition software

## 2024-04-08 ENCOUNTER — Ambulatory Visit (INDEPENDENT_AMBULATORY_CARE_PROVIDER_SITE_OTHER)

## 2024-04-08 VITALS — Ht 64.0 in | Wt 166.0 lb

## 2024-04-08 DIAGNOSIS — Z0000 Encounter for general adult medical examination without abnormal findings: Secondary | ICD-10-CM | POA: Diagnosis not present

## 2024-04-08 NOTE — Patient Instructions (Signed)
 Ms. Amanda Collier , Thank you for taking time out of your busy schedule to complete your Annual Wellness Visit with me. I enjoyed our conversation and look forward to speaking with you again next year. I, as well as your care team,  appreciate your ongoing commitment to your health goals. Please review the following plan we discussed and let me know if I can assist you in the future. Your Game plan/ To Do List    Referrals: If you haven't heard from the office you've been referred to, please reach out to them at the phone provided.   Follow up Visits: Next Medicare AWV with our clinical staff: 04/20/25   Have you seen your provider in the last 6 months (3 months if uncontrolled diabetes)? No Next Office Visit with your provider: will call to schedule for physical   Clinician Recommendations:  Each day, aim for 6 glasses of water, plenty of protein in your diet and try to get up and walk/ stretch every hour for 5-10 minutes at a time.        This is a list of the screening recommended for you and due dates:  Health Maintenance  Topic Date Due   Medicare Annual Wellness Visit  12/26/2023   Eye exam for diabetics  03/29/2024   COVID-19 Vaccine (5 - 2024-25 season) 04/16/2024*   Flu Shot  06/13/2024   Hemoglobin A1C  07/26/2024   Yearly kidney function blood test for diabetes  10/28/2024   Yearly kidney health urinalysis for diabetes  01/23/2025   Complete foot exam   01/23/2025   Mammogram  01/24/2025   DTaP/Tdap/Td vaccine (3 - Td or Tdap) 02/22/2027   DEXA scan (bone density measurement)  01/31/2029   Pneumonia Vaccine  Completed   Hepatitis C Screening  Completed   Zoster (Shingles) Vaccine  Completed   HPV Vaccine  Aged Out   Meningitis B Vaccine  Aged Out   Colon Cancer Screening  Discontinued  *Topic was postponed. The date shown is not the original due date.    Advanced directives: (In Chart) A copy of your advanced directives are scanned into your chart should your provider ever  need it. Advance Care Planning is important because it:  [x]  Makes sure you receive the medical care that is consistent with your values, goals, and preferences  [x]  It provides guidance to your family and loved ones and reduces their decisional burden about whether or not they are making the right decisions based on your wishes.  Follow the link provided in your after visit summary or read over the paperwork we have mailed to you to help you started getting your Advance Directives in place. If you need assistance in completing these, please reach out to us  so that we can help you!  See attachments for Preventive Care and Fall Prevention Tips.

## 2024-04-08 NOTE — Progress Notes (Signed)
 Subjective:   Amanda Collier is a 78 y.o. who presents for a Medicare Wellness preventive visit.  As a reminder, Annual Wellness Visits don't include a physical exam, and some assessments may be limited, especially if this visit is performed virtually. We may recommend an in-person follow-up visit with your provider if needed.  Visit Complete: Virtual I connected with  Amanda Collier on 04/08/24 by a audio enabled telemedicine application and verified that I am speaking with the correct person using two identifiers.  Patient Location: Home  Provider Location: Office/Clinic  I discussed the limitations of evaluation and management by telemedicine. The patient expressed understanding and agreed to proceed.  Vital Signs: Because this visit was a virtual/telehealth visit, some criteria may be missing or patient reported. Any vitals not documented were not able to be obtained and vitals that have been documented are patient reported.  VideoDeclined- This patient declined Librarian, academic. Therefore the visit was completed with audio only.  Persons Participating in Visit: Patient.  AWV Questionnaire: Yes: Patient Medicare AWV questionnaire was completed by the patient on 04/08/24; I have confirmed that all information answered by patient is correct and no changes since this date.  Cardiac Risk Factors include: advanced age (>66men, >52 women);dyslipidemia;hypertension     Objective:     Today's Vitals   04/08/24 1534  Weight: 166 lb (75.3 kg)  Height: 5\' 4"  (1.626 m)   Body mass index is 28.49 kg/m.     04/08/2024    3:42 PM 12/25/2022   11:38 AM 12/13/2021   11:44 AM 12/12/2021   12:03 PM 10/31/2021   11:53 AM 08/04/2021   12:01 PM 07/28/2021    2:07 PM  Advanced Directives  Does Patient Have a Medical Advance Directive? Yes Yes Yes Yes Yes Yes No  Type of Estate agent of [place];Living will Healthcare Power of  [place];Living will Healthcare Power of [place];Living will Healthcare Power of [employer] of [place];Living will   Does patient want to make changes to medical advance directive? No - Patient declined No - Patient declined    No - Patient declined   Copy of Healthcare Power of Attorney in Chart? Yes - validated most recent copy scanned in chart (See row information) Yes - validated most recent copy scanned in chart (See row information)  Yes - validated most recent copy scanned in chart (See row information)  No - copy requested   Would patient like information on creating a medical advance directive?      No - Patient declined No - Patient declined    Current Medications (verified) Outpatient Encounter Medications as of 04/08/2024  Medication Sig   Acetaminophen  (TYLENOL  ARTHRITIS PAIN PO) Take 600 mg by mouth in the morning and at bedtime.   atorvastatin  (LIPITOR) 10 MG tablet TAKE 1 TABLET BY MOUTH EVERY NIGHT AT BEDTIME   azithromycin  (ZITHROMAX ) 250 MG tablet Take 2 tabs today, then 1 tab daily for 4 days   clopidogrel  (PLAVIX ) 75 MG tablet TAKE 1 TABLET BY MOUTH DAILY   divalproex  (DEPAKOTE  ER) 250 MG 24 hr tablet Take 1-3 tablets (250-750 mg total) by mouth See admin instructions. Take 250 mg by mouth in the morning and 750 mg at bedtime   losartan  (COZAAR ) 100 MG tablet Take 1 tablet (100 mg total) by mouth daily.   topiramate  (TOPAMAX ) 50 MG tablet TAKE 1 TABLET BY MOUTH AT BEDTIME   No facility-administered encounter medications on file as  of 04/08/2024.    Allergies (verified) Codeine, Lexapro [escitalopram oxalate], Lisinopril, and Lotensin [benazepril hcl]   History: Past Medical History:  Diagnosis Date   Anxiety    Diabetes mellitus without complication (HCC)    Headache syndrome 12/18/2016   Heart murmur 03/21/2021   Last echo 2018; calcified aortic valve w/o regurg or stenosis. Nl EF.    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 unknown if correct/patient adopted   Social History   Socioeconomic History   Marital status: Married    Spouse name: Aireonna Bauer   Number of children: 2   Years of education: Geophysicist/field seismologist, almost masters   Highest education level: Bachelor's degree (e.g., BA, AB, BS)  Occupational History   Not on file  Tobacco Use   Smoking status: Never   Smokeless tobacco: Never  Vaping Use   Vaping status: Never Used  Substance and Sexual Activity   Alcohol use: Yes    Alcohol/week: 7.0 standard drinks of alcohol    Types: 7 Standard drinks or equivalent per week   Drug use: No   Sexual activity: Yes    Partners: Male    Birth control/protection: Post-menopausal    Comment: vasectomy  Other Topics Concern   Not on file  Social History Narrative   Not on file   Social Drivers of Health   Financial Resource Strain: Low Risk  (04/08/2024)   Overall Financial Resource Strain (CARDIA)    Difficulty of Paying Living Expenses: Not hard at all  Food Insecurity: No Food Insecurity (04/08/2024)   Hunger Vital Sign    Worried About Running Out of Food in the Last Year: Never true    Ran Out of Food in the Last Year: Never true  Transportation Needs: No Transportation Needs (04/08/2024)   PRAPARE - Administrator, Civil Service  (Medical): No    Lack of Transportation (Non-Medical): No  Physical Activity: Inactive (04/08/2024)   Exercise Vital Sign    Days of Exercise per Week: 0 days    Minutes of Exercise per Session: 0 min  Stress: No Stress Concern Present (04/08/2024)   [employer] of Occupational Health - Occupational Stress Questionnaire    Feeling of Stress : Only a little  Social Connections: Socially Integrated (04/08/2024)   Social Connection and Isolation Panel [NHANES]    Frequency of Communication with Friends and Family: More than three times a week    Frequency of Social Gatherings with Friends and Family: More than three times a week    Attends Religious Services: More than 4 times per year    Active Member of [employer] or Organizations: Yes    Attends Banker Meetings: 1 to 4 times per year    Marital Status: Married  Tobacco Counseling Counseling given: Not Answered    Clinical Intake:  Pre-visit preparation completed: Yes  Pain : No/denies pain     BMI - recorded: 28.49 Nutritional Status: BMI 25 -29 Overweight Nutritional Risks: None Diabetes: Yes CBG done?: No Did pt. bring in CBG monitor from home?: No  Lab Results  Component Value Date   HGBA1C 7.1 (H) 01/24/2024   HGBA1C 6.8 (H) 10/29/2023   HGBA1C 6.3 (A) 02/22/2023     How often do you need to have someone help you when you read instructions, pamphlets, or other written materials from your doctor or pharmacy?: 1 - Never  Interpreter Needed?: No  Information entered by :: Lamont Pilsner, LPN   Activities of Daily Living     04/08/2024    8:49 AM  In your present state of health, do you have any difficulty performing the following activities:  Hearing? 0  Vision? 0  Difficulty concentrating or making decisions? 0  Walking or climbing stairs? 0  Dressing or bathing? 0  Doing errands, shopping? 0  Preparing Food and eating ? N  Using the Toilet? N  In the past six months, have you  accidently leaked urine? N  Do you have problems with loss of bowel control? N  Managing your Medications? N  Managing your Finances? N  Housekeeping or managing your Housekeeping? N    Patient Care Team: Luevenia Saha, MD as PCP - General (Family Medicine) Daphine Eagle, MD as Consulting Physician (Psychiatry) Audie Bleacher, MD as Consulting Physician (Neurosurgery) Phebe Brasil, MD as Consulting Physician (Neurology) Rawland Caddy, MD as Consulting Physician (Physical Medicine and Rehabilitation)  Indicate any recent Medical Services you may have received from other than [facility] providers in the past year (date may be approximate).     Assessment:    This is a routine wellness examination for [place].  Hearing/Vision screen Hearing Screening - Comments:: Pt denies any hearing issues  Vision Screening - Comments:: Wears rx glasses - up to date with routine eye exams with Dr Annabell Key     Goals Addressed               This Visit's Progress     lose weight (pt-stated)        Lose weight        Depression Screen     04/08/2024    3:39 PM 03/31/2024    2:17 PM 01/24/2024   10:02 AM 10/29/2023    4:18 PM 02/22/2023   10:30 AM 02/22/2023   10:29 AM 12/25/2022   11:34 AM  PHQ 2/9 Scores  PHQ - 2 Score 0 0 0 0 0 0 0  PHQ- 9 Score     0 1 0    Fall Risk     04/08/2024    8:49 AM 03/31/2024    2:29 PM 03/31/2024    2:17 PM 01/24/2024   10:02 AM 10/29/2023    4:18 PM  Fall Risk   Falls in the past year? 0 0 0 0 0  Number falls in past yr: 0 0 0 0 0  Injury with Fall? 0 0 0 0 0  Risk for fall due to : Impaired mobility No Fall Risks No Fall Risks No Fall Risks   Follow up Falls prevention discussed Falls evaluation completed Falls evaluation completed Falls evaluation completed Falls evaluation completed    MEDICARE RISK AT HOME:  Medicare Risk at Home Any stairs in or around the home?: (Patient-Rptd) Yes  If so, are there any without handrails?: (Patient-Rptd)  No Home free of loose throw rugs in walkways, pet beds, electrical cords, etc?: (Patient-Rptd) Yes Adequate lighting in your home to reduce risk of falls?: No Life alert?: (Patient-Rptd) No Use of a cane, walker or w/c?: (Patient-Rptd) Yes Grab bars in the bathroom?: (Patient-Rptd) Yes Shower chair or bench in shower?: (Patient-Rptd) No Elevated toilet seat or a handicapped toilet?: (Patient-Rptd) No  TIMED UP AND GO:  Was the test performed?  No  Cognitive Function: 6CIT completed        04/08/2024    3:43 PM 12/25/2022   11:40 AM 12/12/2021   12:05 PM  6CIT Screen  What Year? 0 points 0 points 0 points  What month? 0 points 0 points 0 points  What time? 0 points 0 points 0 points  Count back from 20 0 points 0 points 2 points  Months in reverse 0 points 0 points 0 points  Repeat phrase 0 points 0 points 0 points  Total Score 0 points 0 points 2 points    Immunizations Immunization History  Administered Date(s) Administered   Fluad Quad(high Dose 65+) 08/08/2021, 07/28/2022   Fluzone  Influenza virus vaccine,trivalent (IIV3), split virus 09/17/2013, 09/06/2017, 09/16/2018   Influenza Split 09/05/2006, 09/03/2008, 08/24/2009, 08/08/2010, 08/30/2012, 07/26/2022   Influenza, High Dose Seasonal PF 08/30/2012   Influenza,inj,Quad PF,6+ Mos 07/30/2019   Influenza,inj,quad, With Preservative 08/18/2014, 09/24/2015   PFIZER(Purple Top)SARS-COV-2 Vaccination 12/19/2019, 01/13/2020, 09/23/2020   Pfizer Covid-19 Vaccine Bivalent Booster 56yrs & up 09/25/2021   Pneumococcal Conjugate-13 04/13/2014   Pneumococcal Polysaccharide-23 10/18/2011   Respiratory Syncytial Virus Vaccine,Recomb Aduvanted(Arexvy) 07/26/2022   Tdap 01/30/2007, 02/21/2017   Zoster Recombinant(Shingrix ) 03/25/2020, 11/08/2020   Zoster, Live 09/28/2008, 03/25/2020, 11/08/2020    Screening Tests Health Maintenance  Topic Date Due   OPHTHALMOLOGY EXAM  03/29/2024   COVID-19 Vaccine (5 - 2024-25 season)  04/16/2024 (Originally 07/15/2023)   INFLUENZA VACCINE  06/13/2024   HEMOGLOBIN A1C  07/26/2024   Diabetic kidney evaluation - eGFR measurement  10/28/2024   Diabetic kidney evaluation - Urine ACR  01/23/2025   FOOT EXAM  01/23/2025   MAMMOGRAM  01/24/2025   Medicare Annual Wellness (AWV)  04/08/2025   DTaP/Tdap/Td (3 - Td or Tdap) 02/22/2027   DEXA SCAN  01/31/2029   Pneumonia Vaccine 21+ Years old  Completed   Hepatitis C Screening  Completed   Zoster Vaccines- Shingrix   Completed   HPV VACCINES  Aged Out   Meningococcal B Vaccine  Aged Out   Colonoscopy  Discontinued    Health Maintenance  Health Maintenance Due  Topic Date Due   OPHTHALMOLOGY EXAM  03/29/2024   Health Maintenance Items Addressed: See Nurse Notes  Additional Screening:  Vision Screening: Recommended annual ophthalmology exams for early detection of glaucoma and other disorders of the eye.  Dental Screening: Recommended annual dental exams for proper oral hygiene  Community Resource Referral / Chronic Care Management: CRR required this visit?  No   CCM required this visit?  No   Plan:    I have personally reviewed and noted the following in the patient's chart:   Medical and social history Use of alcohol, tobacco or illicit drugs  Current medications and supplements including opioid prescriptions. Patient is not currently taking opioid prescriptions. Functional ability and status Nutritional status Physical activity Advanced directives List of other physicians Hospitalizations, surgeries, and ER visits in previous 12 months Vitals Screenings to include cognitive, depression, and falls Referrals and appointments  In  addition, I have reviewed and discussed with patient certain preventive protocols, quality metrics, and best practice recommendations. A written personalized care plan for preventive services as well as general preventive health recommendations were provided to patient.   Bruno Capri, LPN   [id number]   After Visit Summary: (MyChart) Due to this being a telephonic visit, the after visit summary with patients personalized plan was offered to patient via MyChart   Notes: Nothing significant to report at this time.

## 2024-04-23 ENCOUNTER — Ambulatory Visit: Admitting: Family Medicine

## 2024-04-30 ENCOUNTER — Ambulatory Visit: Admitting: Family Medicine

## 2024-05-06 ENCOUNTER — Encounter: Payer: Self-pay | Admitting: Family Medicine

## 2024-05-06 ENCOUNTER — Ambulatory Visit: Admitting: Family Medicine

## 2024-05-06 VITALS — BP 126/78 | HR 76 | Temp 97.7°F | Ht 64.0 in | Wt 168.0 lb

## 2024-05-06 DIAGNOSIS — G4489 Other headache syndrome: Secondary | ICD-10-CM | POA: Diagnosis not present

## 2024-05-06 DIAGNOSIS — Z8679 Personal history of other diseases of the circulatory system: Secondary | ICD-10-CM | POA: Diagnosis not present

## 2024-05-06 DIAGNOSIS — I10 Essential (primary) hypertension: Secondary | ICD-10-CM

## 2024-05-06 DIAGNOSIS — E119 Type 2 diabetes mellitus without complications: Secondary | ICD-10-CM | POA: Diagnosis not present

## 2024-05-06 LAB — POCT GLYCOSYLATED HEMOGLOBIN (HGB A1C): Hemoglobin A1C: 6.2 % — AB (ref 4.0–5.6)

## 2024-05-06 NOTE — Patient Instructions (Addendum)
 Please return in 3 months to recheck diabetes and blood pressure    If you have any questions or concerns, please don't hesitate to send me a message via MyChart or call the office at [phone]. Thank you for visiting with us  today! It's our pleasure caring for you.    VISIT SUMMARY: Today, we reviewed your diabetes management and medication. Your A1c has improved significantly, and we discussed your physical activity and alcohol consumption changes. We also talked about your history of headaches and the plan to wean off topiramate . Additionally, we noted that you are overdue for an eye examination.  YOUR PLAN: -DIABETES MELLITUS TYPE 2: Diabetes Mellitus Type 2 is a condition where your body does not use insulin properly, leading to high blood sugar levels. Your A1c has improved from 7.1% to 6.2%, thanks to increased physical activity and reduced wine consumption. Continue with your current management plan, keep up with physical activity, and avoid wine.  -HEADACHES: Your headaches related to the subdural hematoma have resolved. We will start weaning you off topiramate  over the next 4-6 weeks by gradually reducing the dosage. Please monitor for any return of headaches, and if they do come back, we may consider restarting topiramate  at a lower dose.  -GENERAL HEALTH MAINTENANCE: You are overdue for an eye examination. Please schedule an appointment with an eye doctor in October.  INSTRUCTIONS: Please schedule a follow-up appointment in December to monitor your diabetes and overall health.                      Contains text generated by Abridge.                                 Contains text generated by Abridge.

## 2024-05-06 NOTE — Progress Notes (Signed)
 Subjective  CC:  Chief Complaint  Patient presents with   Hypertension   Hyperlipidemia   Diabetes    HPI: Amanda Collier is a 78 y.o. female who presents to the office today for follow up of diabetes and problems listed above in the chief complaint.  Discussed the use of AI scribe software for clinical note transcription with the patient, who gave verbal consent to proceed.  History of Present Illness Amanda Collier is a 78 year old female with diabetes who presents for follow-up of her diabetes and medication management.  Her A1c has decreased from 7.1% to 6.2% as per her recent finger stick. She has increased her physical activity during a recent trip and changed her alcohol consumption, switching from wine to a single shot of scotch on the rocks with water each night. Despite this improvement, her weight remains stable or slightly increased. She feels well. No retinopathy. Needs to defer her annual eye exam as she is leaving for the [facility] for the summer.   She has a history of subdural hematoma and underwent brain surgery in the summer of 2021. She was prescribed topiramate  by a nurse practitioner at [facility] for headache prevention prior to her surgery. She no longer experiences headaches and currently takes one pill per day. Topamax  50  She previously experienced diarrhea but reports that it has resolved with the use of florastar, which she takes once daily. She notes that this medication has been effective in managing her symptoms.  No chest pain and no current headaches. She mentions being overdue for an eye doctor appointment.    Wt Readings from Last 3 Encounters:  05/06/24 168 lb (76.2 kg)  04/08/24 166 lb (75.3 kg)  01/24/24 166 lb 3.2 oz (75.4 kg)    BP Readings from Last 3 Encounters:  05/06/24 126/78  01/24/24 132/77  10/29/23 138/80    Assessment  1. Controlled type 2 diabetes mellitus without complication, without long-term current use  of insulin (HCC)   2. Essential hypertension   3. History of subdural hematoma   4. Headache syndrome      Plan  Assessment and Plan Assessment & Plan Diabetes Mellitus Type 2 A1c improved from 7.1% to 6.2%. Increased physical activity and reduced wine consumption contributed to better glucose control. Weight stable or slightly increased. - Continue current management without new medications. - Encourage continued physical activity and dietary modifications, including avoiding wine. - schedule eye exam in october  Headaches Headaches related to subdural hematoma resolved. Discussed weaning off topiramate  and monitoring for recurrence. - Wean off topiramate  over 4-6 weeks by gradually reducing dosage. - Monitor for return of headaches. - If headaches return, consider restarting topiramate  at a lower dose.  HTN is controlled on losartan .  HLD lipids are at goal.   General Health Maintenance Overdue for an eye examination. - Schedule an appointment with an eye doctor in October.  Follow-up Routine follow-up to monitor diabetes and overall health. - Schedule follow-up appointment in December to monitor diabetes and overall health.    Follow up: 6 mo for recheck bp and dm Orders Placed This Encounter  Procedures   POCT HgB A1C   No orders of the defined types were placed in this encounter.     Immunization History  Administered Date(s) Administered   Fluad Quad(high Dose 65+) 08/08/2021, 07/28/2022   Fluzone  Influenza virus vaccine,trivalent (IIV3), split virus 09/17/2013, 09/06/2017, 09/16/2018   Influenza Split 09/05/2006, 09/03/2008, 08/24/2009, 08/08/2010, 08/30/2012, 07/26/2022  Influenza, High Dose Seasonal PF 08/30/2012   Influenza,inj,Quad PF,6+ Mos 07/30/2019   Influenza,inj,quad, With Preservative 08/18/2014, 09/24/2015   PFIZER(Purple Top)SARS-COV-2 Vaccination 12/19/2019, 01/13/2020, 09/23/2020   Pfizer Covid-19 Vaccine Bivalent Booster 45yrs & up  09/25/2021   Pneumococcal Conjugate-13 04/13/2014   Pneumococcal Polysaccharide-23 10/18/2011   Respiratory Syncytial Virus Vaccine,Recomb Aduvanted(Arexvy) 07/26/2022   Tdap 01/30/2007, 02/21/2017   Zoster Recombinant(Shingrix ) 03/25/2020, 11/08/2020   Zoster, Live 09/28/2008, 03/25/2020, 11/08/2020    Diabetes Related Lab Review: Lab Results  Component Value Date   HGBA1C 6.2 (A) 05/06/2024   HGBA1C 7.1 (H) 01/24/2024   HGBA1C 6.8 (H) 10/29/2023    Lab Results  Component Value Date   MICROALBUR 0.7 01/24/2024   Lab Results  Component Value Date   CREATININE 0.83 10/29/2023   BUN 25 (H) 10/29/2023   NA 141 10/29/2023   K 3.7 10/29/2023   CL 105 10/29/2023   CO2 25 10/29/2023   Lab Results  Component Value Date   CHOL 146 01/24/2024   CHOL 151 12/13/2022   CHOL 141 07/29/2021   Lab Results  Component Value Date   HDL 64.80 01/24/2024   HDL 62.80 12/13/2022   HDL 57 07/29/2021   Lab Results  Component Value Date   LDLCALC 53 01/24/2024   LDLCALC 65 12/13/2022   LDLCALC 62 07/29/2021   Lab Results  Component Value Date   TRIG 140.0 01/24/2024   TRIG 116.0 12/13/2022   TRIG 108 07/29/2021   Lab Results  Component Value Date   CHOLHDL 2 01/24/2024   CHOLHDL 2 12/13/2022   CHOLHDL 2.5 07/29/2021   No results found for: LDLDIRECT The 10-year ASCVD risk score (Arnett DK, et al., 2019) is: 47.5%   Values used to calculate the score:     Age: 87 years     Clincally relevant sex: Female     Is Non-Hispanic African American: No     Diabetic: Yes     Tobacco smoker: No     Systolic Blood Pressure: 126 mmHg     Is BP treated: Yes     HDL Cholesterol: 64.8 mg/dL     Total Cholesterol: 146 mg/dL I have reviewed the PMH, Fam and Soc history. Patient Active Problem List   Diagnosis Date Noted   Controlled type 2 diabetes mellitus without complication, without long-term current use of insulin (HCC) 12/13/2021    Priority: High   History of subdural hematoma  12/13/2021    Priority: High   Hx of traumatic brain injury 03/09/2021    Priority: High   Combined hyperlipidemia associated with type 2 diabetes mellitus (HCC) 12/26/2020    Priority: High   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HCC) 12/24/2020    Priority: High   Essential hypertension 12/17/2015    Priority: High   Osteopenia 01/24/2024    Priority: Medium     Normal bone density screening 2022, GYN office.  [facility].  Reviewed in chart. DEXA 2024, [facility]: lowest T = -1.7, left femur neck. Osteopenia; significant decrease at all sights from 2022. Rec vit D and calcium  and recheck in 2 years.     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    Heart murmur 03/21/2021    Priority: Low    Last echo 2018; calcified aortic valve w/o regurg or stenosis. Nl EF.     Right-sided sensorineural hearing loss 09/29/2020    Priority: Low   Status post craniotomy 06/24/2020    Priority: Low    Social History: Patient  reports that she has never smoked. She has never used smokeless tobacco. She reports current alcohol use of about 7.0 standard drinks of alcohol per week. She reports that she does not use drugs.  Review of Systems: Ophthalmic: negative for eye pain, loss of vision or double vision Cardiovascular: negative for chest pain Respiratory: negative for SOB or persistent cough Gastrointestinal: negative for abdominal pain Genitourinary: negative for dysuria or gross hematuria MSK: negative for foot lesions Neurologic: negative for weakness or gait disturbance  Objective  Vitals: BP 126/78   Pulse 76   Temp 97.7 F  (36.5 C)   Ht 5' 4 (1.626 m)   Wt 168 lb (76.2 kg)   LMP 11/14/2003   SpO2 96%   BMI 28.84 kg/m  General: well appearing, no acute distress  Psych:  Alert and oriented, normal mood and affect HEENT:  Normocephalic, atraumatic, moist mucous membranes, supple neck  Cardiovascular:  Nl S1 and S2, RRR without murmur, gallop or rub. no edema Respiratory:  Good breath sounds bilaterally, CTAB with normal effort, no rales Gastrointestinal: normal BS, soft, nontender Skin:  Warm, no rashes Neurologic:   Mental status is normal. normal gait Foot exam: no erythema, pallor, or cyanosis visible nl proprioception and sensation to monofilament testing bilaterally, +2 distal pulses bilaterally  Diabetic education: ongoing education regarding chronic disease management for diabetes was given today. We continue to reinforce the ABC's of diabetic management: A1c (<7 or 8 dependent upon patient), tight blood pressure control, and cholesterol management with goal LDL < 100 minimally. We discuss diet strategies, exercise recommendations, medication options and possible side effects. At each visit, we review recommended immunizations and preventive care recommendations for diabetics and stress that good diabetic control can prevent other problems. See below for this patient's data. Commons side effects, risks, benefits, and alternatives for medications and treatment plan prescribed today were discussed, and the patient expressed understanding of the given instructions. Patient is instructed to call or message via MyChart if he/she has any questions or concerns regarding our treatment plan. No barriers to understanding were identified. We discussed Red Flag symptoms and signs in detail. Patient expressed understanding regarding what to do in case of urgent or emergency type symptoms.  Medication list was reconciled, printed and provided to the patient in AVS. Patient instructions and summary information was reviewed  with the patient as documented in the AVS. This note was prepared with assistance of Dragon voice recognition software. Occasional wrong-word or sound-a-like substitutions may have occurred due to the inherent limitations of voice recognition software

## 2024-07-21 DIAGNOSIS — D692 Other nonthrombocytopenic purpura: Secondary | ICD-10-CM | POA: Diagnosis not present

## 2024-07-21 DIAGNOSIS — Z08 Encounter for follow-up examination after completed treatment for malignant neoplasm: Secondary | ICD-10-CM | POA: Diagnosis not present

## 2024-07-21 DIAGNOSIS — Z85828 Personal history of other malignant neoplasm of skin: Secondary | ICD-10-CM | POA: Diagnosis not present

## 2024-07-21 DIAGNOSIS — L821 Other seborrheic keratosis: Secondary | ICD-10-CM | POA: Diagnosis not present

## 2024-07-21 DIAGNOSIS — L814 Other melanin hyperpigmentation: Secondary | ICD-10-CM | POA: Diagnosis not present

## 2024-07-21 DIAGNOSIS — L570 Actinic keratosis: Secondary | ICD-10-CM | POA: Diagnosis not present

## 2024-08-15 DIAGNOSIS — Z23 Encounter for immunization: Secondary | ICD-10-CM | POA: Diagnosis not present

## 2024-09-11 ENCOUNTER — Telehealth: Payer: Self-pay

## 2024-09-11 NOTE — Telephone Encounter (Signed)
 Copied from CRM [id number]. Topic: Clinical - Lab/Test Results >> Sep 10, 2024 10:18 AM Nessti S wrote: Reason for CRM: julie would like copy of lab results from [facility] in December 2024.    ----------------------------------------------------------------------- From previous Reason for Contact - Other: Reason for CRM: julie would like copy of lab results from [facility] in December 2024.

## 2024-09-16 ENCOUNTER — Encounter: Payer: Self-pay | Admitting: Family Medicine

## 2024-09-16 NOTE — Telephone Encounter (Signed)
Labs has been faxed

## 2024-09-16 NOTE — Telephone Encounter (Signed)
 Patient called in in regards to message from CMA about picking up lab results. Patient stated that she think that the message was sent to her in error. She think that the information was supposed to be sent to Dr Vincente. Mliss is her engineer, site.

## 2024-09-23 ENCOUNTER — Other Ambulatory Visit: Payer: Self-pay

## 2024-09-23 ENCOUNTER — Emergency Department (HOSPITAL_BASED_OUTPATIENT_CLINIC_OR_DEPARTMENT_OTHER)

## 2024-09-23 ENCOUNTER — Emergency Department (HOSPITAL_BASED_OUTPATIENT_CLINIC_OR_DEPARTMENT_OTHER)
Admission: EM | Admit: 2024-09-23 | Discharge: 2024-09-23 | Disposition: A | Discharge status: Home / Self Care | Attending: Emergency Medicine | Admitting: Emergency Medicine

## 2024-09-23 ENCOUNTER — Encounter (HOSPITAL_BASED_OUTPATIENT_CLINIC_OR_DEPARTMENT_OTHER): Payer: Self-pay | Admitting: Emergency Medicine

## 2024-09-23 DIAGNOSIS — I10 Essential (primary) hypertension: Secondary | ICD-10-CM | POA: Diagnosis not present

## 2024-09-23 DIAGNOSIS — R079 Chest pain, unspecified: Secondary | ICD-10-CM | POA: Diagnosis not present

## 2024-09-23 DIAGNOSIS — R0789 Other chest pain: Secondary | ICD-10-CM | POA: Diagnosis not present

## 2024-09-23 LAB — CBC WITH DIFFERENTIAL/PLATELET
Abs Immature Granulocytes: 0.02 K/uL (ref 0.00–0.07)
Basophils Absolute: 0 K/uL (ref 0.0–0.1)
Basophils Relative: 1 %
Eosinophils Absolute: 0.2 K/uL (ref 0.0–0.5)
Eosinophils Relative: 4 %
HCT: 42.2 % (ref 36.0–46.0)
Hemoglobin: 14.5 g/dL (ref 12.0–15.0)
Immature Granulocytes: 0 %
Lymphocytes Relative: 39 %
Lymphs Abs: 2 K/uL (ref 0.7–4.0)
MCH: 32.9 pg (ref 26.0–34.0)
MCHC: 34.4 g/dL (ref 30.0–36.0)
MCV: 95.7 fL (ref 80.0–100.0)
Monocytes Absolute: 0.6 K/uL (ref 0.1–1.0)
Monocytes Relative: 11 %
Neutro Abs: 2.3 K/uL (ref 1.7–7.7)
Neutrophils Relative %: 45 %
Platelets: 211 K/uL (ref 150–400)
RBC: 4.41 MIL/uL (ref 3.87–5.11)
RDW: 12.5 % (ref 11.5–15.5)
WBC: 5 K/uL (ref 4.0–10.5)
nRBC: 0 % (ref 0.0–0.2)

## 2024-09-23 LAB — COMPREHENSIVE METABOLIC PANEL WITH GFR
ALT: 16 U/L (ref 0–44)
AST: 27 U/L (ref 15–41)
Albumin: 4.6 g/dL (ref 3.5–5.0)
Alkaline Phosphatase: 120 U/L (ref 38–126)
Anion gap: 13 (ref 5–15)
BUN: 32 mg/dL — ABNORMAL HIGH (ref 8–23)
CO2: 26 mmol/L (ref 22–32)
Calcium: 9.6 mg/dL (ref 8.9–10.3)
Chloride: 100 mmol/L (ref 98–111)
Creatinine, Ser: 1 mg/dL (ref 0.44–1.00)
GFR, Estimated: 58 mL/min — ABNORMAL LOW (ref >60)
Glucose, Bld: 144 mg/dL — ABNORMAL HIGH (ref 70–99)
Potassium: 4.8 mmol/L (ref 3.5–5.1)
Sodium: 138 mmol/L (ref 135–145)
Total Bilirubin: 0.4 mg/dL (ref 0.0–1.2)
Total Protein: 7.2 g/dL (ref 6.5–8.1)

## 2024-09-23 LAB — TROPONIN T, HIGH SENSITIVITY
Troponin T High Sensitivity: <15 ng/L (ref 0–19)
Troponin T High Sensitivity: <15 ng/L (ref 0–19)

## 2024-09-23 NOTE — ED Provider Notes (Signed)
 [place] EMERGENCY DEPARTMENT AT [facility] Provider Note   CSN: [id number] Arrival date & time: 09/23/24  0133     Patient presents with: Chest Pain and Hypertension   Amanda Collier is a 78 y.o. female.   Presents to the emergency department for evaluation of chest discomfort.  Symptoms began approximately 1 hour ago.  Patient reports that she was lying in bed watching TV when symptoms began.  She developed a tightness in the center of her chest that did not radiate.  No associated nausea, diaphoresis or shortness of breath.  She did check her blood pressure and it was elevated.       Prior to Admission medications   Medication Sig Start Date End Date Taking? Authorizing Provider  Acetaminophen  (TYLENOL  ARTHRITIS PAIN PO) Take 600 mg by mouth in the morning and at bedtime.    [provider]  atorvastatin  (LIPITOR) 10 MG tablet TAKE 1 TABLET BY MOUTH EVERY NIGHT AT BEDTIME 01/24/24   Jodie Lavern CROME, MD  clopidogrel  (PLAVIX ) 75 MG tablet TAKE 1 TABLET BY MOUTH DAILY 01/29/24   Jodie Lavern CROME, MD  divalproex  (DEPAKOTE  ER) 250 MG 24 hr tablet Take 1-3 tablets (250-750 mg total) by mouth See admin instructions. Take 250 mg by mouth in the morning and 750 mg at bedtime 11/02/20   Babs Arthea DASEN, MD  losartan  (COZAAR ) 100 MG tablet Take 1 tablet (100 mg total) by mouth daily. 01/24/24   Jodie Lavern CROME, MD  topiramate  (TOPAMAX ) 50 MG tablet TAKE 1 TABLET BY MOUTH AT BEDTIME 11/12/23   Jodie Lavern CROME, MD    Allergies: Codeine, Lexapro [escitalopram oxalate], Lisinopril, and Lotensin [benazepril hcl]    Review of Systems  Updated Vital Signs BP (!) 110/95   Pulse 78   Temp 98 F (36.7 C) (Oral)   Resp (!) 24   Ht 5' 4 (1.626 m)   Wt 78.9 kg   LMP 11/14/2003   SpO2 98%   BMI 29.87 kg/m   Physical Exam Vitals and nursing note reviewed.  Constitutional:      General: She is not in acute distress.    Appearance: She is well-developed.  HENT:      Head: Normocephalic and atraumatic.     Mouth/Throat:     Mouth: Mucous membranes are moist.  Eyes:     General: Vision grossly intact. Gaze aligned appropriately.     Extraocular Movements: Extraocular movements intact.     Conjunctiva/sclera: Conjunctivae normal.  Cardiovascular:     Rate and Rhythm: Normal rate and regular rhythm.     Pulses: Normal pulses.     Heart sounds: Normal heart sounds, S1 normal and S2 normal. No murmur heard.    No friction rub. No gallop.  Pulmonary:     Effort: Pulmonary effort is normal. No respiratory distress.     Breath sounds: Normal breath sounds.  Abdominal:     General: Bowel sounds are normal.     Palpations: Abdomen is soft.     Tenderness: There is no abdominal tenderness. There is no guarding or rebound.     Hernia: No hernia is present.  Musculoskeletal:        General: No swelling.     Cervical back: Full passive range of motion without pain, normal range of motion and neck supple. No spinous process tenderness or muscular tenderness. Normal range of motion.     Right lower leg: No edema.     Left lower leg:  No edema.  Skin:    General: Skin is warm and dry.     Capillary Refill: Capillary refill takes less than 2 seconds.     Findings: No ecchymosis, erythema, rash or wound.  Neurological:     General: No focal deficit present.     Mental Status: She is alert and oriented to person, place, and time.     GCS: GCS eye subscore is 4. GCS verbal subscore is 5. GCS motor subscore is 6.     Cranial Nerves: Cranial nerves 2-12 are intact.     Sensory: Sensation is intact.     Motor: Motor function is intact.     Coordination: Coordination is intact.  Psychiatric:        Attention and Perception: Attention normal.        Mood and Affect: Mood normal.        Speech: Speech normal.        Behavior: Behavior normal.     (all labs ordered are listed, but only abnormal results are displayed) Labs Reviewed  COMPREHENSIVE METABOLIC PANEL  WITH GFR - Abnormal; Notable for the following components:      Result Value   Glucose, Bld 144 (*)    BUN 32 (*)    GFR, Estimated 58 (*)    All other components within normal limits  CBC WITH DIFFERENTIAL/PLATELET  TROPONIN T, HIGH SENSITIVITY  TROPONIN T, HIGH SENSITIVITY    EKG: EKG Interpretation Date/Time:  Tuesday September 23 2024 02:27:07 EST Ventricular Rate:  75 PR Interval:  168 QRS Duration:  97 QT Interval:  405 QTC Calculation: 453 R Axis:   -6  Text Interpretation: Sinus rhythm Low voltage, precordial leads No significant change since last tracing Confirmed by Haze Lonni PARAS ([id number]) on 09/23/2024 2:30:47 AM  Radiology: [facility] Chest Port 1 View Result Date: 09/23/2024 EXAM: 1 VIEW XRAY OF THE CHEST 09/23/2024 02:09:00 AM COMPARISON: None available. CLINICAL HISTORY: Chest pain. FINDINGS: LUNGS AND PLEURA: No focal pulmonary opacity. No pulmonary edema. No pleural effusion. No pneumothorax. HEART AND MEDIASTINUM: No acute abnormality of the cardiac and mediastinal silhouettes. BONES AND SOFT TISSUES: No acute osseous abnormality. IMPRESSION: 1. No acute process. Electronically signed by: Pinkie Pebbles MD 09/23/2024 02:15 AM EST RP Workstation: [id number]     Procedures   Medications Ordered in the ED - No data to display                                  Medical Decision Making Amount and/or Complexity of Data Reviewed External Data Reviewed: ECG. Labs: ordered. Decision-making details documented in ED Course. Radiology: ordered and independent interpretation performed. Decision-making details documented in ED Course. ECG/medicine tests: ordered and independent interpretation performed. Decision-making details documented in ED Course.   Differential Diagnosis considered includes, but not limited to: STEMI; NSTEMI; myocarditis; pericarditis; pulmonary embolism; aortic dissection; pneumothorax; pneumonia; gastritis; musculoskeletal pain  Presents to  the emergency department for evaluation of chest pain.  Patient reports elevated blood pressure as well.  She has a history of hypertension, normally well-controlled.  Pain began while she was lying in bed, does not radiate, no other associated symptoms.  Is atypical for cardiac etiology.  Patient appears very nervous at arrival.  She has improved with time here in the ED, no interventions needed.  Patient's EKG without ischemia/infarct.  Troponin negative x 2.  Workup has been reassuring.  Patient does not require  hospitalization, refer back to primary care, given return precautions.     Final diagnoses:  Chest pain, unspecified type    ED Discharge Orders     None          Haze Lonni PARAS, MD 09/23/24 [phone]

## 2024-09-23 NOTE — ED Notes (Signed)
 Pt complaining of increased chest tightness

## 2024-09-23 NOTE — ED Triage Notes (Signed)
  Patient comes in with chest tightness that started around 0045.  Patient states she ate dinner with husband around 1900 and was watching tv.  Patient states she went to get up from sitting and felt tightness in middle of chest.  States pain is non-radiating.  Denies any N/V, diaphoresis, or SOB.  Checked BP at home and was elevated 195/115.  Pain 5/10, tightness.

## 2024-10-03 DIAGNOSIS — H524 Presbyopia: Secondary | ICD-10-CM | POA: Diagnosis not present

## 2024-10-03 DIAGNOSIS — H5203 Hypermetropia, bilateral: Secondary | ICD-10-CM | POA: Diagnosis not present

## 2024-10-03 DIAGNOSIS — H2513 Age-related nuclear cataract, bilateral: Secondary | ICD-10-CM | POA: Diagnosis not present

## 2024-10-03 DIAGNOSIS — H53143 Visual discomfort, bilateral: Secondary | ICD-10-CM | POA: Diagnosis not present

## 2024-10-03 DIAGNOSIS — H52223 Regular astigmatism, bilateral: Secondary | ICD-10-CM | POA: Diagnosis not present

## 2024-12-14 ENCOUNTER — Telehealth: Payer: Self-pay | Admitting: Family Medicine

## 2024-12-14 NOTE — Telephone Encounter (Signed)
 LVM to r/s appt from 12/16/24 due to weather

## 2024-12-16 ENCOUNTER — Ambulatory Visit: Admitting: Family Medicine

## 2024-12-17 NOTE — Telephone Encounter (Signed)
 LVM to r/s appt from 12/16/24 with Jodie

## 2024-12-18 ENCOUNTER — Encounter: Payer: Self-pay | Admitting: Family Medicine

## 2024-12-18 ENCOUNTER — Ambulatory Visit: Admitting: Family Medicine

## 2024-12-18 VITALS — BP 148/72 | HR 68 | Temp 97.7°F | Ht 64.0 in | Wt 172.8 lb

## 2024-12-18 DIAGNOSIS — F3110 Bipolar disorder, current episode manic without psychotic features, unspecified: Secondary | ICD-10-CM

## 2024-12-18 DIAGNOSIS — R102 Pelvic and perineal pain unspecified side: Secondary | ICD-10-CM

## 2024-12-18 DIAGNOSIS — E1169 Type 2 diabetes mellitus with other specified complication: Secondary | ICD-10-CM

## 2024-12-18 DIAGNOSIS — I10 Essential (primary) hypertension: Secondary | ICD-10-CM

## 2024-12-18 DIAGNOSIS — E119 Type 2 diabetes mellitus without complications: Secondary | ICD-10-CM

## 2024-12-18 LAB — COMPREHENSIVE METABOLIC PANEL WITH GFR
ALT: 10 U/L (ref 3–35)
AST: 12 U/L (ref 5–37)
Albumin: 4.5 g/dL (ref 3.5–5.2)
Alkaline Phosphatase: 69 U/L (ref 39–117)
BUN: 21 mg/dL (ref 6–23)
CO2: 30 meq/L (ref 19–32)
Calcium: 9.6 mg/dL (ref 8.4–10.5)
Chloride: 104 meq/L (ref 96–112)
Creatinine, Ser: 0.81 mg/dL (ref 0.40–1.20)
GFR: 69.21 mL/min
Glucose, Bld: 144 mg/dL — ABNORMAL HIGH (ref 70–99)
Potassium: 3.9 meq/L (ref 3.5–5.1)
Sodium: 141 meq/L (ref 135–145)
Total Bilirubin: 0.7 mg/dL (ref 0.2–1.2)
Total Protein: 7.1 g/dL (ref 6.0–8.3)

## 2024-12-18 LAB — POCT GLYCOSYLATED HEMOGLOBIN (HGB A1C): Hemoglobin A1C: 6.7 % — AB (ref 4.0–5.6)

## 2024-12-18 LAB — MICROALBUMIN / CREATININE URINE RATIO
Creatinine,U: 355.3 mg/dL
Microalb Creat Ratio: 10 mg/g (ref 0.0–30.0)
Microalb, Ur: 3.5 mg/dL — ABNORMAL HIGH (ref 0.7–1.9)

## 2024-12-18 LAB — HEMOGLOBIN A1C: Hgb A1c MFr Bld: 7.1 % — ABNORMAL HIGH (ref 4.6–6.5)

## 2024-12-18 LAB — URINALYSIS, ROUTINE W REFLEX MICROSCOPIC
Hgb urine dipstick: NEGATIVE
Ketones, ur: 15 — AB
Leukocytes,Ua: NEGATIVE
Nitrite: NEGATIVE
RBC / HPF: NONE SEEN
Specific Gravity, Urine: 1.025 (ref 1.000–1.030)
Urine Glucose: NEGATIVE
Urobilinogen, UA: 1 (ref 0.0–1.0)
pH: 6 (ref 5.0–8.0)

## 2024-12-18 LAB — LIPID PANEL
Cholesterol: 144 mg/dL (ref 28–200)
HDL: 52.7 mg/dL
LDL Cholesterol: 48 mg/dL (ref 10–99)
NonHDL: 91.67
Total CHOL/HDL Ratio: 3
Triglycerides: 219 mg/dL — ABNORMAL HIGH (ref 10.0–149.0)
VLDL: 43.8 mg/dL — ABNORMAL HIGH (ref 0.0–40.0)

## 2024-12-18 LAB — CBC
HCT: 41.7 % (ref 36.0–46.0)
Hemoglobin: 14.3 g/dL (ref 12.0–15.0)
MCHC: 34.3 g/dL (ref 30.0–36.0)
MCV: 93.6 fl (ref 78.0–100.0)
Platelets: 246 10*3/uL (ref 150.0–400.0)
RBC: 4.45 Mil/uL (ref 3.87–5.11)
RDW: 13.3 % (ref 11.5–15.5)
WBC: 4.3 10*3/uL (ref 4.0–10.5)

## 2024-12-18 LAB — TSH: TSH: 2.74 u[IU]/mL (ref 0.35–5.50)

## 2024-12-18 MED ORDER — HYDROCHLOROTHIAZIDE 12.5 MG PO CAPS: 12.5000 mg | ORAL_CAPSULE | Freq: Every day | ORAL | 3 refills | Status: AC

## 2024-12-18 NOTE — Patient Instructions (Signed)
 Please return in 2-4 weeks for recheck.  I will release your lab results to you on your MyChart account with further instructions. You may see the results before I do, but when I review them I will send you a message with my report or have my assistant call you if things need to be discussed. Please reply to my message with any questions. Thank you!   If you have any questions or concerns, please don't hesitate to send me a message via MyChart or call the office at [phone]. Thank you for visiting with us  today! It's our pleasure caring for you.

## 2024-12-18 NOTE — Progress Notes (Signed)
 " Subjective  Chief Complaint  Patient presents with   abdominal tightness   Diabetes    HPI: Amanda Collier is a 79 y.o. female who presents to [facility] [place] today for a Female Wellness Visit. She also has the concerns and/or needs as listed above in the chief complaint. These will be addressed in addition to the Health Maintenance Visit.   Wellness Visit: annual visit with health maintenance review and exam  HM: screens are all current.  Colonoscopy up-to-date.  She did not need any further screenings.  Mammogram done.  Due again next month.  Doing well.  Traveling next week to see family on the [place].  Immunizations current  Chronic disease f/u and/or acute problem visit: (deemed necessary to be done in addition to the wellness visit): Discussed the use of AI scribe software for clinical note transcription with the patient, who gave verbal consent to proceed.  History of Present Illness Amanda Collier is a 79 year old female with diabetes who presents with pelvic discomfort.  Pelvic discomfort - Describes a pressure sensation in the pelvic area for the past couple of weeks, more chronic if noticeable but at times is not really aware of it unless she has pain attention to it.  No fevers chills change in bowel movements or urinary symptoms.  No change in diet.  Prior history of diarrhea has been resolved with Florastor.  Having normal bowel movements.  No prolapse symptoms. - Described as a sensation of pressure rather than pain - Discomfort is more noticeable when walking or lying in bed at night - No evidence of prolapse or changes with straining - No urinary or bowel movement abnormalities - No rectal symptoms or hemorrhoids  Type 2 diabetes diet controlled without complications with associated hypertension and hyperlipidemia on statin and blood pressure medication - Diabetic control reportedly is good.  No symptoms of hyperglycemia.  No  complications.  No foot sores.  Eye exam up-to-date without retinopathy.  She takes ARB.  Does not check her sugars. - Associated with hyperlipidemia on atorvastatin .  Fasting for recheck today.  Control has been good.  No side effects. - Associated hypertension -Blood pressure mildly elevated in the office today, attributed to stress.  Husband is completing treatment for prostate cancer.  Life is a bit hectic. - Recent blood pressure recorded at 140/72 mmHg - No chest pain, shortness of breath, lower extremity edema, palpitations.  She is compliant with her losartan  100 mg daily.  No cough Bipolar disorder:.  Stable on Depakote . History of subdural hematoma/traumatic brain injury, no longer with headache.  No longer on Topamax .  Psychosocial stressors - Recent stress related to husband's prostate cancer treatment and the passing of her father - Plans to travel out of town to visit family soon    Assessment  1. Controlled type 2 diabetes mellitus without complication, without long-term current use of insulin (HCC)   2. Combined hyperlipidemia associated with type 2 diabetes mellitus (HCC)   3. Essential hypertension   4. 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HCC)   5. Pelvic pressure in female      Plan  Female Wellness Visit: Age appropriate Health Maintenance and Prevention measures were discussed with patient. Included topics are cancer screening recommendations, ways to keep healthy (see AVS) including dietary and exercise recommendations, regular eye and dental care, use of seat belts, and avoidance of moderate alcohol use and tobacco use.  Mammogram next month BMI: discussed patient's  BMI and encouraged positive lifestyle modifications to help get to or maintain a target BMI. HM needs and immunizations were addressed and ordered. See below for orders. See HM and immunization section for updates. Routine labs and screening tests ordered including cmp, cbc and lipids where appropriate. Discussed  recommendations regarding Vit D and calcium  supplementation (see AVS)  Chronic disease management visit and/or acute problem visit: Assessment and Plan Assessment & Plan Pelvic pressure Reports pelvic pressure for a couple of weeks, described as a sensation rather than pain, more noticeable when walking or lying down. No urinary or bowel symptoms, no prolapse, and no significant tenderness on examination. Differential includes gastrointestinal issues such as diverticular disease, but no current symptoms suggestive of this. Stress may be a contributing factor. - Monitor symptoms for the next week or two. - Scheduled follow-up appointment before going out of town. - Will perform full pelvic exam if symptoms do not improve or worsen.  Abdominal exam mostly unremarkable today.  Benign abdomen. - Checked urine and urine culture today - Advised use of Tylenol  or Advil for discomfort. - Ensure regular bowel movements.  Type 2 diabetes mellitus associated with hyperlipidemia and hypertension. A1c is 6.7, slightly elevated but controlled with diet.  Diet controlled without complications. Continue healthy diet  Essential hypertension Blood pressure was elevated at 140/72, possibly due to stress related to husband's health and recent family events. - Checked blood pressure and reviewed medications. - Add Microzide  12.5 mg daily to losartan  100 mg daily.  Check renal function and electrolytes.  Recheck blood pressure in 2 to 4 weeks  Hyperlipidemia associate with diabetes Continue atorvastatin  - Checked blood work including cholesterol levels.  And LFTs, goal LDL less than 70  Bipolar disorder stable on Depakote .  No changes today.  History of TBI and headaches, resolved.  Removed Topamax  I personally spent a total of 48 minutes in the care of the patient today including preparing to see the patient, getting/reviewing separately obtained history, performing a medically appropriate exam/evaluation,  counseling and educating, placing orders, documenting clinical information in the EHR, independently interpreting results, and communicating results.   Follow up: 2 to 4 weeks to recheck pelvic pressure and blood pressure Orders Placed This Encounter  Procedures   Urine Culture   Comprehensive metabolic panel with GFR   Lipid panel   Hemoglobin A1c   TSH   Microalbumin / creatinine urine ratio   CBC   Urinalysis, Routine w reflex microscopic   POCT HgB A1C   Meds ordered this encounter  Medications   hydrochlorothiazide  (MICROZIDE ) 12.5 MG capsule    Sig: Take 1 capsule (12.5 mg total) by mouth daily.    Dispense:  90 capsule    Refill:  3      Body mass index is 29.66 kg/m. Wt Readings from Last 3 Encounters:  12/18/24 172 lb 12.8 oz (78.4 kg)  09/23/24 174 lb (78.9 kg)  05/06/24 168 lb (76.2 kg)     Patient Active Problem List   Diagnosis Date Noted Date Diagnosed   Controlled type 2 diabetes mellitus without complication, without long-term current use of insulin (HCC) 12/13/2021     Priority: High   History of subdural hematoma 12/13/2021     Priority: High   Hx of traumatic brain injury 03/09/2021     Priority: High   Combined hyperlipidemia associated with type 2 diabetes mellitus (HCC) 12/26/2020     Priority: High   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HCC) 12/24/2020  Priority: High   Essential hypertension 12/17/2015     Priority: High   Osteopenia 01/24/2024     Priority: Medium     Normal bone density screening 2022, GYN office.  [facility].  Reviewed in chart. DEXA 2024, [facility]: lowest T = -1.7, left femur neck. Osteopenia; significant decrease at all sights from 2022. Rec vit D and calcium  and recheck in 2 years.     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    Heart murmur 03/21/2021     Priority: Low    Last echo 2018; calcified aortic valve w/o regurg or stenosis. Nl EF.     Right-sided sensorineural hearing loss 09/29/2020     Priority: Low   Status post craniotomy 06/24/2020     Priority: Low   Health Maintenance  Topic Date Due   OPHTHALMOLOGY EXAM  03/29/2024   Mammogram  01/24/2025   COVID-19 Vaccine (5 - 2025-26 season) 01/03/2025 (Originally 07/14/2024)   Diabetic kidney evaluation - Urine ACR  01/23/2025   FOOT EXAM  01/23/2025   Medicare Annual Wellness (AWV)  04/08/2025   HEMOGLOBIN A1C  06/17/2025   Diabetic kidney evaluation - eGFR measurement  09/23/2025   DTaP/Tdap/Td (3 - Td or Tdap) 02/22/2027   Bone Density Scan  01/31/2029   Pneumococcal Vaccine: 50+ Years  Completed   Influenza Vaccine  Completed   Hepatitis C Screening  Completed   Zoster Vaccines- Shingrix   Completed   Meningococcal B Vaccine  Aged Out   Colonoscopy  Discontinued   Immunization History  Administered Date(s) Administered   Fluad Quad(high Dose 65+) 08/08/2021, 07/28/2022   Fluzone  Influenza virus vaccine,trivalent (IIV3), split virus 09/17/2013, 09/06/2017, 09/16/2018   INFLUENZA, HIGH DOSE SEASONAL PF 08/30/2012   Influenza Split 09/05/2006, 09/03/2008, 08/24/2009, 08/08/2010, 08/30/2012, 07/26/2022   Influenza,inj,Quad PF,6+ Mos 07/30/2019   Influenza,inj,quad, With Preservative 08/18/2014, 09/24/2015   PFIZER(Purple Top)SARS-COV-2 Vaccination 12/19/2019, 01/13/2020, 09/23/2020   Pfizer Covid-19 Vaccine Bivalent Booster 60yrs & up 09/25/2021   Pneumococcal Conjugate-13 04/13/2014   Pneumococcal Polysaccharide-23 10/18/2011   Respiratory Syncytial Virus Vaccine,Recomb Aduvanted(Arexvy) 07/26/2022   Tdap 01/30/2007, 02/21/2017   Zoster Recombinant(Shingrix )  03/25/2020, 11/08/2020   Zoster, Live 09/28/2008, 03/25/2020, 11/08/2020   We updated and reviewed the patient's past history in detail and it is documented below. Allergies: Patient is allergic to codeine, lexapro [escitalopram oxalate], lisinopril, and lotensin [benazepril hcl]. Past Medical History Patient  has a past medical history of Anxiety, Diabetes mellitus without complication (HCC), Headache syndrome (12/18/2016), Heart murmur (03/21/2021), High blood pressure, Hypertension, and TIA (transient ischemic attack) (07/2021). Past Surgical History Patient  has a past surgical history that includes Craniotomy (Bilateral, 06/24/2020) and Craniotomy (Right, 06/26/2020). Family History: Patient family history includes Alzheimer's disease in her mother; Heart attack in her maternal uncle; Heart disease in her father. She was adopted. Social History:  Patient  reports that she has never smoked. She has never used smokeless tobacco. She reports current alcohol use of about 7.0 standard  drinks of alcohol per week. She reports that she does not use drugs.  Review of Systems: Constitutional: negative for fever or malaise Ophthalmic: negative for photophobia, double vision or loss of vision Cardiovascular: negative for chest pain, dyspnea on exertion, or new LE swelling Respiratory: negative for SOB or persistent cough Gastrointestinal: negative for abdominal pain, change in bowel habits or melena Genitourinary: negative for dysuria or gross hematuria, no abnormal uterine bleeding or disharge Musculoskeletal: negative for new gait disturbance or muscular weakness Integumentary: negative for new or persistent rashes, no breast lumps Neurological: negative for TIA or stroke symptoms Psychiatric: negative for SI or delusions Allergic/Immunologic: negative for hives  Patient Care Team    Relationship Specialty Notifications Start End  Jodie Lavern CROME, MD PCP - General Family Medicine  12/24/20    Vincente Grip, MD Consulting Physician Psychiatry  12/18/16   Gillie Duncans, MD Consulting Physician Neurosurgery  12/24/20   Onita Duos, MD Consulting Physician Neurology  12/24/20   Babs Arthea DASEN, MD Consulting Physician Physical Medicine and Rehabilitation  12/24/20     Objective  Vitals: BP (!) 148/72   Pulse 68   Temp 97.7 F (36.5 C)   Ht 5' 4 (1.626 m)   Wt 172 lb 12.8 oz (78.4 kg)   LMP 11/14/2003   SpO2 98%   BMI 29.66 kg/m  General:  Well developed, well nourished, no acute distress  Psych:  Alert and orientedx3,normal mood and affect HEENT:  Normocephalic, atraumatic, non-icteric sclera,  supple neck without adenopathy, mass or thyromegaly Cardiovascular:  Normal S1, S2, RRR without gallop, rub or murmur Respiratory:  Good breath sounds bilaterally, CTAB with normal respiratory effort Gastrointestinal: normal bowel sounds, soft, non-tender except possible minimal tenderness in left lower quadrant without rebound or guarding, no noted masses. No HSM MSK: extremities without edema, joints without erythema or swelling Neurologic:    Mental status is normal.  Gross motor and sensory exams are normal.  No tremor  Commons side effects, risks, benefits, and alternatives for medications and treatment plan prescribed today were discussed, and the patient expressed understanding of the given instructions. Patient is instructed to call or message via MyChart if he/she has any questions or concerns regarding our treatment plan. No barriers to understanding were identified. We discussed Red Flag symptoms and signs in detail. Patient expressed understanding regarding what to do in case of urgent or emergency type symptoms.  Medication list was reconciled, printed and provided to the patient in AVS. Patient instructions and summary information was reviewed with the patient as documented in the AVS. This note was prepared with assistance of Dragon voice recognition software. Occasional  wrong-word or sound-a-like substitutions may have occurred due to the inherent limitations of voice recognition software     "

## 2024-12-19 ENCOUNTER — Other Ambulatory Visit: Payer: Self-pay

## 2024-12-19 ENCOUNTER — Ambulatory Visit: Payer: Self-pay | Admitting: Family Medicine

## 2024-12-19 DIAGNOSIS — N390 Urinary tract infection, site not specified: Secondary | ICD-10-CM

## 2024-12-19 LAB — URINE CULTURE
MICRO NUMBER:: 17553683
Result:: NO GROWTH
SPECIMEN QUALITY:: ADEQUATE

## 2024-12-19 MED ORDER — SULFAMETHOXAZOLE-TRIMETHOPRIM 800-160 MG PO TABS: 1.0000 | ORAL_TABLET | Freq: Two times a day (BID) | ORAL | 0 refills | Status: AC

## 2024-12-19 NOTE — Progress Notes (Signed)
 Please call patient: urine shows possible infection: this could be the cause of her symptoms. Please order septra  ds 1 PO bid x 7 days.  Also, diabetes has worsened a bit with A1c at 7.1. Needs to improve diet or we can start medication to help. Need ov in 3 months to recheck.  Cholesterol and other results are ok.

## 2025-01-14 ENCOUNTER — Ambulatory Visit: Admitting: Family Medicine

## 2025-04-20 ENCOUNTER — Ambulatory Visit
